# Patient Record
Sex: Female | Born: 1960
Health system: Southern US, Community
[De-identification: ages and names within clinical notes are randomized; demographics above are authoritative.]

## PROBLEM LIST (undated history)

## (undated) DIAGNOSIS — F319 Bipolar disorder, unspecified: Secondary | ICD-10-CM

## (undated) DIAGNOSIS — F419 Anxiety disorder, unspecified: Secondary | ICD-10-CM

## (undated) DIAGNOSIS — J42 Unspecified chronic bronchitis: Secondary | ICD-10-CM

## (undated) DIAGNOSIS — J449 Chronic obstructive pulmonary disease, unspecified: Secondary | ICD-10-CM

## (undated) DIAGNOSIS — F609 Personality disorder, unspecified: Secondary | ICD-10-CM

## (undated) DIAGNOSIS — F32A Depression, unspecified: Secondary | ICD-10-CM

## (undated) DIAGNOSIS — F329 Major depressive disorder, single episode, unspecified: Secondary | ICD-10-CM

## (undated) HISTORY — DX: Personality disorder, unspecified: F60.9

## (undated) HISTORY — DX: Depression, unspecified: F32.A

## (undated) HISTORY — PX: TONSILLECTOMY: SHX5217

## (undated) HISTORY — DX: Major depressive disorder, single episode, unspecified: F32.9

## (undated) HISTORY — DX: Bipolar disorder, unspecified: F31.9

## (undated) HISTORY — DX: Anxiety disorder, unspecified: F41.9

## (undated) HISTORY — DX: Chronic obstructive pulmonary disease, unspecified: J44.9

## (undated) HISTORY — PX: CARPAL TUNNEL RELEASE: SHX101

## (undated) HISTORY — DX: Unspecified chronic bronchitis: J42

---

## 2006-05-21 ENCOUNTER — Encounter: Admission: RE | Admit: 2006-05-21 | Discharge: 2006-05-21 | Payer: Self-pay | Admitting: Family Medicine

## 2006-07-09 ENCOUNTER — Ambulatory Visit (HOSPITAL_BASED_OUTPATIENT_CLINIC_OR_DEPARTMENT_OTHER): Admission: RE | Admit: 2006-07-09 | Discharge: 2006-07-09 | Payer: Self-pay | Admitting: Orthopedic Surgery

## 2007-02-28 ENCOUNTER — Ambulatory Visit (HOSPITAL_COMMUNITY): Admission: RE | Admit: 2007-02-28 | Discharge: 2007-02-28 | Payer: Self-pay | Admitting: Gastroenterology

## 2007-03-13 ENCOUNTER — Ambulatory Visit (HOSPITAL_BASED_OUTPATIENT_CLINIC_OR_DEPARTMENT_OTHER): Admission: RE | Admit: 2007-03-13 | Discharge: 2007-03-13 | Payer: Self-pay | Admitting: Orthopedic Surgery

## 2008-05-26 ENCOUNTER — Observation Stay (HOSPITAL_COMMUNITY): Admission: EM | Admit: 2008-05-26 | Discharge: 2008-05-27 | Payer: Self-pay | Admitting: Emergency Medicine

## 2009-01-10 ENCOUNTER — Encounter: Payer: Self-pay | Admitting: Internal Medicine

## 2009-01-15 ENCOUNTER — Encounter: Payer: Self-pay | Admitting: Internal Medicine

## 2009-02-08 ENCOUNTER — Encounter: Payer: Self-pay | Admitting: Internal Medicine

## 2009-02-17 ENCOUNTER — Ambulatory Visit: Payer: Self-pay | Admitting: Internal Medicine

## 2009-02-17 DIAGNOSIS — I10 Essential (primary) hypertension: Secondary | ICD-10-CM

## 2009-02-17 DIAGNOSIS — F17200 Nicotine dependence, unspecified, uncomplicated: Secondary | ICD-10-CM | POA: Insufficient documentation

## 2009-02-17 DIAGNOSIS — R05 Cough: Secondary | ICD-10-CM

## 2009-02-17 DIAGNOSIS — R059 Cough, unspecified: Secondary | ICD-10-CM | POA: Insufficient documentation

## 2009-02-17 HISTORY — DX: Essential (primary) hypertension: I10

## 2009-04-29 ENCOUNTER — Emergency Department (HOSPITAL_COMMUNITY): Admission: EM | Admit: 2009-04-29 | Discharge: 2009-04-29 | Payer: Self-pay | Admitting: Emergency Medicine

## 2009-06-28 ENCOUNTER — Encounter: Admission: RE | Admit: 2009-06-28 | Discharge: 2009-06-28 | Payer: Self-pay | Admitting: General Surgery

## 2009-07-07 ENCOUNTER — Encounter: Admission: RE | Admit: 2009-07-07 | Discharge: 2009-07-07 | Payer: Self-pay | Admitting: *Deleted

## 2009-07-13 ENCOUNTER — Encounter: Admission: RE | Admit: 2009-07-13 | Discharge: 2009-07-13 | Payer: Self-pay | Admitting: *Deleted

## 2009-08-03 ENCOUNTER — Emergency Department (HOSPITAL_COMMUNITY): Admission: EM | Admit: 2009-08-03 | Discharge: 2009-08-03 | Payer: Self-pay | Admitting: Emergency Medicine

## 2009-08-11 ENCOUNTER — Inpatient Hospital Stay (HOSPITAL_COMMUNITY): Admission: AD | Admit: 2009-08-11 | Discharge: 2009-08-16 | Payer: Self-pay | Admitting: Psychiatry

## 2009-08-11 ENCOUNTER — Ambulatory Visit: Payer: Self-pay | Admitting: Psychiatry

## 2009-12-23 ENCOUNTER — Ambulatory Visit (HOSPITAL_COMMUNITY)
Admission: RE | Admit: 2009-12-23 | Discharge: 2009-12-23 | Payer: Self-pay | Source: Home / Self Care | Attending: Obstetrics and Gynecology | Admitting: Obstetrics and Gynecology

## 2010-02-05 ENCOUNTER — Encounter: Payer: Self-pay | Admitting: Gastroenterology

## 2010-02-16 NOTE — Letter (Signed)
Summary: [facility]  [facility]   Imported By: Sherian Rein 02/22/2009 13:41:27  _____________________________________________________________________  External Attachment:    Type:   Image     Comment:   External Document

## 2010-02-16 NOTE — Letter (Signed)
Summary: [facility]  [facility]   Imported By: Sherian Rein 02/22/2009 13:38:51  _____________________________________________________________________  External Attachment:    Type:   Image     Comment:   External Document

## 2010-02-16 NOTE — Assessment & Plan Note (Signed)
Summary: Pulmonary consultation/ eval cough and sob   Visit Type:  Initial Consult Copy to:  Dr. Warrick Parisian Primary Provider/Referring Provider:  Dr. Warrick Parisian   CC:  Dyspnea.  History of Present Illness: 57 yowf smoking since the age 50 with no prevous chronic or recurrent lower resp resp  c/os  despite constant year round nasal congestion and varible severity " sinus pains"  February 17, 2009 initial pulmonary eval  cc persistent daily sob and chest burning dry cough indolent 08/2009 but  esp worse x 3 week prior to first pulmonary  ov seen twice at [facility] no better with symbicort and proaire.  Not sleeping well but not bothered by night time co's or early am exac. Also notes overt intermittent hb   Pt denies any significant sore throat, dysphagia, itching, sneezing,  nasal congestion or excess secretions,  fever, chills, sweats, unintended wt loss, pleuritic or exertional cp, hempoptysis, change in activity tolerance  orthopnea pnd or leg swelling   Current Medications (verified): 1)  Lamictal 200 Mg Tabs (Lamotrigine) .... 2 Once Daily 2)  Trazodone Hcl 300 Mg Tabs (Trazodone Hcl) .Marland Kitchen.. 1 At Bedtime 3)  Lithium Carbonate 450 Mg Cr-Tabs (Lithium Carbonate) .Marland Kitchen.. 1 Three Times A Day 4)  Dexilant 60 Mg Cpdr (Dexlansoprazole) .Marland Kitchen.. 1 Every Am 5)  Symbicort 160-4.5 Mcg/act Aero (Budesonide-Formoterol Fumarate) .Marland Kitchen.. 1 Puff Two Times A Day 6)  Clonazepam 0.5 Mg Tabs (Clonazepam) .Marland Kitchen.. 1 Three Times A Day 7)  Proair Hfa 108 (90 Base) Mcg/act Aers (Albuterol Sulfate) .Marland Kitchen.. 1-2 Puffs Every 4-6 Hours As Needed  Allergies (verified): 1)  ! * Avelox 2)  ! Pcn  Past History:  Past Medical History: BIPOLAR DISORDER UNSPECIFIED (ICD-296.80) HYPERTENSION (ICD-401.9) Sinusitis     - surgery 1988   Past Surgical History: C-Section x 3 Sinus surgery 1988  Family History: Metastatic Breast CA- Mother Negative for respiratory diseases or atopy   Social  History: Married Ship broker Current smoker since age 18.  Smokes 1 ppd.    Review of Systems       The patient complains of shortness of breath with activity, non-productive cough, chest pain, acid heartburn, loss of appetite, abdominal pain, anxiety, and depression.  The patient denies shortness of breath at rest, productive cough, coughing up blood, irregular heartbeats, indigestion, weight change, difficulty swallowing, sore throat, tooth/dental problems, headaches, nasal congestion/difficulty breathing through nose, sneezing, itching, ear ache, hand/feet swelling, joint stiffness or pain, rash, change in color of mucus, and fever.    Vital Signs:  Patient profile:   50 year old female Height:      65 inches Weight:      166 pounds BMI:     27.72 O2 Sat:      98 % on Room air Temp:     98.3 degrees F oral Pulse rate:   80 / minute BP sitting:   114 / 76  (left arm)  Vitals Entered By: Vernie Murders (February 17, 2009 2:29 PM)  O2 Flow:  Room air  Physical Exam  Additional Exam:  amb anxious slt hoarse wm nad wt 166 February 17, 2009 HEENT mild turbinate edema.  Oropharynx no thrush or excess pnd or cobblestoning.  No JVD or cervical adenopathy. Mild accessory muscle hypertrophy. Trachea midline, nl thryroid. Chest was hyperinflated by percussion with diminished breath sounds and moderate increased exp time without wheeze. Hoover sign positive at mid inspiration. Regular rate and rhythm without murmur gallop or rub or  increase P2 or edema.  Abd: no hsm, nl excursion. Ext warm without cyanosis or clubbing.     CXR  Procedure date:  02/17/2009  Findings:        Comparison: 05/26/2008.  CT 05/26/2008.   Findings: The cardiac silhouette is normal size and shape. Lungs are free of infiltrates.  No pleural effusions are seen. There is scoliosis convexity to the right. There is mild degenerative spondylosis compatible with age.   IMPRESSION: No cardiopulmonary abnormality is  seen.  There is scoliosis  Impression & Recommendations:  Problem # 1:  COUGH (ICD-786.2)  Spirometry reviewed from 02/08/09 and is not c/w sign airflow obstruction in the effort independent poart of the f/v loop so the digital readout is unreliable and should be ignored.  Believe this is Upper airway cough syndrome, so named because it's frequently impossible to sort out how much is LPR vs CR/sinusitis with freq throat clearing generating secondary extra esophageal GERD from wide swings in gastric pressure that occur with throat clearing, promoting self use of mint and menthol lozenges that reduce the lower esophageal sphincter tone and exacerbate the problem further.  These symptoms are easily confused with asthma/copd by even experienced pulmonogists because they overlap so much. These are the same pts who not infrequently have failed to tolerate ace inhibitors,  dry powder inhalers or biphosphonates or report having reflux symptoms that don't respond to standard doses of PPI   Of the three most common causes of chronic cough, only one (GERD)  can actually cause the other two (Asthma/chronic rhinitis)  and perpetuate the cylce of cough inducing airway trauma, inflammation, heightened sensitivity to reflux which is prompted by the cough itself via a cyclical mechanism.  This may partially respond to steroids and look like asthma and post nasal drainage but never erradicated completely unless the cough and the secondary reflux are eliminated, preferably both at the same time.   See instructions for specific recommendations re first treating gerd /cyclical cough maximally and f/u here if needed  Problem # 2:  SMOKER (ICD-305.1)  I took this opportunity to educate the patient regarding the consequences of smoking in airway disorders based on all the data we have from the multiple national lung health studies indicating that smoking cessation, not choice of inhalers or physicians, is the most important  aspect of care.    I explained to the pt as clearly as I could that although I would see her if she continues to smoke and promised to try to help her, I could not promise to be effective in treating her without a ful committment for her not to smoke again.   Orders: Consultation Level V [id number])  Medications Added to Medication List This Visit: 1)  Lamictal 200 Mg Tabs (Lamotrigine) .... 2 once daily 2)  Trazodone Hcl 300 Mg Tabs (Trazodone hcl) .Marland Kitchen.. 1 at bedtime 3)  Lithium Carbonate 450 Mg Cr-tabs (Lithium carbonate) .Marland Kitchen.. 1 three times a day 4)  Dexilant 60 Mg Cpdr (Dexlansoprazole) .Marland Kitchen.. 1 every am 5)  Symbicort 160-4.5 Mcg/act Aero (Budesonide-formoterol fumarate) .Marland Kitchen.. 1 puff two times a day 6)  Clonazepam 0.5 Mg Tabs (Clonazepam) .Marland Kitchen.. 1 three times a day 7)  Proair Hfa 108 (90 Base) Mcg/act Aers (Albuterol sulfate) .Marland Kitchen.. 1-2 puffs every 4-6 hours as needed 8)  Tramadol Hcl 50 Mg Tabs (Tramadol hcl) .... One  every 4-6 hours for excess cough  Other Orders: T-2 View CXR (71020TC)  Patient Instructions: 1)  Continue dexilant Take  one 30-60 min before first meal of the day and add pepcid 20mg  at bedtime whether you think you need it or not on a trial basis for one month 2)  stop symbicort and proaire 3)  use combivent 2 puffs every 4 hours as needed for cough or wheeze or short of breath 4)  Take delsym two tsp every 12 hours and add tramadol 50 mg up to every 4 hours to suppress the urge to cough. Swallowing water or using ice chips/non mint and menthol containing candies (such as lifesavers or sugarless jolly ranchers) are also effective.  5)  GERD (REFLUX)  is a common cause of respiratory symptoms. It commonly presents without heartburn and can be treated with medication, but also with lifestyle changes including avoidance of late meals, excessive alcohol, smoking cessation, and avoid fatty foods, chocolate, peppermint, colas, red wine, and acidic juices such as orange juice. NO MINT OR  MENTHOL PRODUCTS SO NO COUGH DROPS  6)  USE SUGARLESS CANDY INSTEAD (jolley ranchers)  7)  NO OIL BASED VITAMINS 8)  if not satisfied return here  9)  Work on inhaler technique:  relax and blow all the way out then take a nice smooth deep breath back in, triggering the inhaler at same time you start breathing in  Prescriptions: TRAMADOL HCL 50 MG  TABS (TRAMADOL HCL) One  every 4-6 hours for excess cough  #40 x 0   Entered and Authorized by:   Nyoka Cowden MD   Signed by:   Nyoka Cowden MD on 02/17/2009   Method used:   Electronically to        [facility]. [phone]* (retail)       [phone]       [place], [place]  [address]       Ph: [phone]       Fax: [phone]   RxID:   [id number]

## 2010-03-28 LAB — CBC
HCT: 39.7 % (ref 36.0–46.0)
Hemoglobin: 13.4 g/dL (ref 12.0–15.0)
MCHC: 33.8 g/dL (ref 30.0–36.0)
MCV: 94.1 fL (ref 78.0–100.0)

## 2010-04-01 LAB — COMPREHENSIVE METABOLIC PANEL
ALT: 42 U/L — ABNORMAL HIGH (ref 0–35)
Alkaline Phosphatase: 49 U/L (ref 39–117)
CO2: 27 mEq/L (ref 19–32)
Calcium: 9.3 mg/dL (ref 8.4–10.5)
GFR calc non Af Amer: >60 mL/min (ref >60)
Glucose, Bld: 78 mg/dL (ref 70–99)
Sodium: 139 mEq/L (ref 135–145)
Total Bilirubin: 0.5 mg/dL (ref 0.3–1.2)

## 2010-04-01 LAB — URINALYSIS, ROUTINE W REFLEX MICROSCOPIC
Hgb urine dipstick: NEGATIVE
Protein, ur: NEGATIVE mg/dL
Urobilinogen, UA: 0.2 mg/dL (ref 0.0–1.0)

## 2010-04-01 LAB — CBC
HCT: 36.5 % (ref 36.0–46.0)
Hemoglobin: 12.4 g/dL (ref 12.0–15.0)
MCH: 31 pg (ref 26.0–34.0)
MCHC: 34 g/dL (ref 30.0–36.0)
MCV: 90.8 fL (ref 78.0–100.0)
Platelets: 297 10*3/uL (ref 150–400)
RDW: 13.1 % (ref 11.5–15.5)
WBC: 12.3 10*3/uL — ABNORMAL HIGH (ref 4.0–10.5)

## 2010-04-01 LAB — POCT I-STAT, CHEM 8
HCT: 42 % (ref 36.0–46.0)
Hemoglobin: 14.3 g/dL (ref 12.0–15.0)
Sodium: 141 mEq/L (ref 135–145)
TCO2: 23 mmol/L (ref 0–100)

## 2010-04-01 LAB — URINE MICROSCOPIC-ADD ON

## 2010-04-01 LAB — RAPID URINE DRUG SCREEN, HOSP PERFORMED
Amphetamines: POSITIVE — AB
Barbiturates: NOT DETECTED
Benzodiazepines: POSITIVE — AB
Opiates: NOT DETECTED
Tetrahydrocannabinol: NOT DETECTED

## 2010-04-01 LAB — DIFFERENTIAL
Basophils Absolute: 0.1 10*3/uL (ref 0.0–0.1)
Eosinophils Absolute: 0.1 10*3/uL (ref 0.0–0.7)
Eosinophils Relative: 1 % (ref 0–5)
Neutrophils Relative %: 75 % (ref 43–77)

## 2010-04-01 LAB — URINE DRUGS OF ABUSE SCREEN W ALC, ROUTINE (REF LAB)
Amphetamine Screen, Ur: NEGATIVE
Cocaine Metabolites: NEGATIVE
Creatinine,U: 198.4 mg/dL
Ethyl Alcohol: <10 mg/dL (ref <10)
Opiate Screen, Urine: NEGATIVE

## 2010-04-01 LAB — TSH: TSH: 0.83 u[IU]/mL (ref 0.350–4.500)

## 2010-04-04 LAB — CBC
Platelets: 271 10*3/uL (ref 150–400)
WBC: 12.4 10*3/uL — ABNORMAL HIGH (ref 4.0–10.5)

## 2010-04-04 LAB — DIFFERENTIAL
Basophils Absolute: 0.1 10*3/uL (ref 0.0–0.1)
Lymphocytes Relative: 24 % (ref 12–46)
Lymphs Abs: 3 10*3/uL (ref 0.7–4.0)
Neutrophils Relative %: 67 % (ref 43–77)

## 2010-04-04 LAB — BASIC METABOLIC PANEL
BUN: 5 mg/dL — ABNORMAL LOW (ref 6–23)
Calcium: 9.4 mg/dL (ref 8.4–10.5)
Creatinine, Ser: 0.64 mg/dL (ref 0.4–1.2)
GFR calc non Af Amer: >60 mL/min (ref >60)

## 2010-04-04 LAB — URINALYSIS, ROUTINE W REFLEX MICROSCOPIC
Glucose, UA: NEGATIVE mg/dL
Nitrite: POSITIVE — AB
Protein, ur: NEGATIVE mg/dL
pH: 7 (ref 5.0–8.0)

## 2010-04-04 LAB — URINE MICROSCOPIC-ADD ON

## 2010-04-16 ENCOUNTER — Other Ambulatory Visit: Payer: Self-pay | Admitting: Internal Medicine

## 2010-04-25 LAB — COMPREHENSIVE METABOLIC PANEL
Alkaline Phosphatase: 54 U/L (ref 39–117)
BUN: 7 mg/dL (ref 6–23)
Glucose, Bld: 93 mg/dL (ref 70–99)
Potassium: 3.8 mEq/L (ref 3.5–5.1)
Total Protein: 6.2 g/dL (ref 6.0–8.3)

## 2010-04-25 LAB — POCT CARDIAC MARKERS
CKMB, poc: <1 ng/mL — ABNORMAL LOW (ref 1.0–8.0)
Myoglobin, poc: 43.8 ng/mL (ref 12–200)

## 2010-04-25 LAB — MAGNESIUM: Magnesium: 2.1 mg/dL (ref 1.5–2.5)

## 2010-04-25 LAB — LIPID PANEL
Cholesterol: 223 mg/dL — ABNORMAL HIGH (ref 0–200)
LDL Cholesterol: 149 mg/dL — ABNORMAL HIGH (ref 0–99)

## 2010-04-25 LAB — DIFFERENTIAL
Basophils Absolute: 0.1 10*3/uL (ref 0.0–0.1)
Basophils Relative: 1 % (ref 0–1)
Monocytes Relative: 7 % (ref 3–12)
Neutro Abs: 6.2 10*3/uL (ref 1.7–7.7)
Neutrophils Relative %: 64 % (ref 43–77)

## 2010-04-25 LAB — PHOSPHORUS: Phosphorus: 3.7 mg/dL (ref 2.3–4.6)

## 2010-04-25 LAB — URINALYSIS, ROUTINE W REFLEX MICROSCOPIC
Glucose, UA: NEGATIVE mg/dL
Hgb urine dipstick: NEGATIVE
Ketones, ur: NEGATIVE mg/dL
Protein, ur: NEGATIVE mg/dL

## 2010-04-25 LAB — CBC
HCT: 34 % — ABNORMAL LOW (ref 36.0–46.0)
HCT: 38.5 % (ref 36.0–46.0)
Hemoglobin: 11.9 g/dL — ABNORMAL LOW (ref 12.0–15.0)
Hemoglobin: 13.3 g/dL (ref 12.0–15.0)
MCHC: 34.5 g/dL (ref 30.0–36.0)
MCV: 95.9 fL (ref 78.0–100.0)
RBC: 3.54 MIL/uL — ABNORMAL LOW (ref 3.87–5.11)
RDW: 13 % (ref 11.5–15.5)
WBC: 11.6 10*3/uL — ABNORMAL HIGH (ref 4.0–10.5)

## 2010-04-25 LAB — CK TOTAL AND CKMB (NOT AT ARMC): Total CK: 65 U/L (ref 7–177)

## 2010-04-25 LAB — PREGNANCY, URINE: Preg Test, Ur: NEGATIVE

## 2010-05-30 NOTE — Op Note (Signed)
[name], [name]               ACCOUNT NO.:  [id number]   MEDICAL RECORD NO.:  [id number]          PATIENT TYPE:  AMB   LOCATION:  DSC                          FACILITY:  [facility]   PHYSICIAN:  Katy Fitch. Sypher, M.D. DATE OF BIRTH:  October 13, 1960   DATE OF PROCEDURE:  07/09/2006  DATE OF DISCHARGE:                               OPERATIVE REPORT   PREOPERATIVE DIAGNOSES:  1. Chronic right lateral elbow extensor tendinopathy with magnetic      resonance imaging proven disruption of extensor carpi radialis      brevis origin.  2. Chronic right carpal tunnel syndrome.   POSTOPERATIVE DIAGNOSES:  1. Chronic right lateral elbow extensor tendinopathy with magnetic      resonance imaging proven disruption of extensor carpi radialis      brevis origin.  2. Chronic right carpal tunnel syndrome.   OPERATION:  1. Reconstruction of right common extensor origin with repair of the      extensor carpi radialis longus and brevis to a decorticated and      drilled right lateral epicondyle with McLaughlin through bone      suture technique.  2. Right carpal tunnel release through separate palmar incision.   OPERATIONS:  Danielle Cox, M.D.   ASSISTANT:  Danielle Maduro Dasnoit PA-C.   ANESTHESIA:  General by LMA.   SUPERVISING ANESTHESIOLOGIST:  Burna Forts, M.D.   INDICATIONS:  Danielle Cox is a 50 year old employee of the [employer].   She was referred for evaluation and management of chronic right lateral  elbow pain and chronic right hand numbness.   Preoperative evaluation including an MRI of the elbow confirmed chronic  extensor origin tendinopathy with edema in the lateral ulnar collateral  ligament origin.  Danielle Cox had failed more than 18 months of  nonoperative management.  Her carpal tunnel syndrome was evaluated with  preoperative electrodiagnostic studies which confirmed significant  median neuropathy.   After informed consent, she is brought to the  operating room at this  time.   PROCEDURE:  Danielle Cox is brought to the operating room and placed in  supine position on the operating table.   Following the induction of general anesthesia by LMA technique, the  right arm was prepped with Betadine soap solution and sterilely draped.   Due to a penicillin allergy, 1 gram of vancomycin was administered  slowly over 90 minutes as an IV prophylactic antibiotic.   The right arm was prepped with Betadine soap solution and sterilely  draped.  A pneumatic tourniquet was applied to proximal right brachium.   Following exsanguination of the right arm with an Esmarch bandage, the  arterial tourniquet was inflated to 250 mmHg.  The procedure commenced  with a short incision in the line of the ring finger in the palm.  Subcutaneous tissues were carefully divided from the palmar fascia.  This was split longitudinally to reveal the common sensory branch of the  median nerve.  These were followed back to the transverse carpal  ligament which was gently isolated at the median nerve.  A [place]field 4  Engineer, structural  was used to gently separate the median nerve from the  transverse carpal ligament.  The ligament was then released  subcutaneously extending into the distal forearm.  This widely opened  the carpal canal.  No mass or other predicaments were noted.  Bleeding  points along the margin of the loose ligament were electrocauterized  with bipolar current followed by repair of the skin with intradermal 3-0  Prolene suture.   Attention was then directed to the right lateral elbow.  An incision was  fashioned along the border between the anconeus and extensor carpi  radialis longus.  Subcutaneous tissues were carefully divided  identifying the epicondyle and epicondylar ridge.  The common extensor  origin including the extensor carpi radialis longus and brevis was  elevated directly off of the epicondyle.  A large cavity was noted at  the brevis  origin deep to the longus.  There was calcification of the  tendon.  The entire tendon was elevated to the level of the radial  capitellar articulation.  Necrotic tendon was removed with a fine  rongeur followed by multiply drilling the lateral condyle.   The common origin was then repaired with two mattress sutures in manner  of McLaughlin with a medial suture row and an over-the-top row to inset  the tendon to the decorticated and drilled epicondyle.  An anatomic  repair was achieved placing the elbow in full extension and full wrist  extension at the time of suture knotting.   The fascia was then repaired with a running suture of 4-0 Vicryl  repairing the periosteum of the humerus to the forearm fascia.  This  reestablished a proper contour of the epicondyle.   The wound was then repaired with subdermal sutures of 4-0 Vicryl and  intradermal 3-0 Prolene.   A compressive dressing was applied to the hand and wrist with a volar  plaster splint maintaining the wrist in 15 degrees of dorsiflexion.  The  elbow was protected with a Tegaderm and sterile gauze dressing and Ace  wrap.  There were no apparent complications.   For aftercare, Danielle Cox was provided a prescription for Dilaudid 2 mg  1-2 tablets p.o. q.4-6h. pain 30 tablets without refill and Motrin 600  mg 1 p.o. q.6h. p.r.n. pain.   We will provide no further prophylactic antibiotics other than the  perioperative vancomycin.   There were no apparent complications.      Katy Fitch Sypher, M.D.  Electronically Signed     RVS/MEDQ  D:  07/09/2006  T:  07/09/2006  Job:  [id number]   cc:   Katy Fitch. Sypher, M.D.

## 2010-05-30 NOTE — H&P (Signed)
Danielle Cox, Danielle Cox               ACCOUNT NO.:  [id number]   MEDICAL RECORD NO.:  [id number]          PATIENT TYPE:  OBV   LOCATION:  1828                         FACILITY:  [facility]   PHYSICIAN:  Lonia Blood, M.D.DATE OF BIRTH:  1960-08-09   DATE OF ADMISSION:  05/26/2008  DATE OF DISCHARGE:                              HISTORY & PHYSICAL   PRIMARY CARE PHYSICIAN:  Production assistant, radio.   CHIEF COMPLAINT:  Chest pressure.   PRESENT ILLNESS:  Ms. Danielle Cox is a 50 year old female smoker with  unknown lipid status who is not diabetic and does not suffer with  hypertension, who presents to the hospital with complaints of chest  pressure.  Though her history is somewhat rambling, she does state that  she has had substernal chest type pressure sensations for months now.  Of significance, it appears to be occurring at a greater frequency over  the last 2 weeks.  Today, while at work, the patient had an episode  described as severe pressure-like sensation directly into the sternum.  This was associated with diaphoresis and significant shortness of  breath.  The patient states that her previous episodes appear to be  worse with physical exertion.  Previously they got better with rest.  This episode occurred while she was somewhat exerting herself, but would  not improve when she rested.  She describes progressive weakness and  generalized dizziness with these episodes over the last few weeks as  well.  Today, she is particularly upset because she was told by  someone that the Avelox that she recently took should not be mixed with  her Geodon and could have led to all of her chest symptoms.   The patient denies fevers, chills, nausea or vomiting.  In the emergency  room she has received nitroglycerin and aspirin.  Her symptoms persist,  though they are minimal at the present time.  She has had an EKG which  was entirely normal during her pain.  The QTC on the EKG is 487  with a  QT of 366.  The patient denies fevers or chills.  There has been some  nausea but no vomiting.  There has been no hematemesis, hematochezia or  melena.  There has been no abdominal pain.   REVIEW OF SYSTEMS:  The patient was recently treated with Avelox for a  sinus infection.  This was prescribed on May 3.  Otherwise,  comprehensive review of systems is unremarkable.  The patient states  that her bipolar disorder is reasonably well-controlled.  She follows  regularly with a psychiatrist for this.   PAST MEDICAL HISTORY:  1. Tobacco abuse, one pack per day plus, since the teen years.  2. Bipolar disorder:  Under care of psychiatrist.   OUTPATIENT MEDICATIONS:  1. Effexor 300 mg daily.  2. Geodon 40 mg daily times 2 months now.  3. Lamictal 400 mg daily.  4. Trazodone 150 mg q.h.s.   ALLERGIES:  PENICILLIN.   FAMILY HISTORY:  The patient's mother died with multiple cancers and a  CVA, per the patient's report.  The patient's father died  of  complications from alcoholism.   SOCIAL HISTORY:  The patient lives in [place].  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  She does smoke.  She denies significant alcohol intake.  She denies illicit drug use.   DATA REVIEW:  CBC is unremarkable.  CMET is unremarkable.  Urinalysis is  unrevealing.  Urine pregnancy test is negative.  Point-of-care markers  are negative x1.  Chest x-ray reveals a left upper lobe nodular density,  with recommendation for a CAT scan of the chest.   CAT scan of the chest was accomplished, with no evidence of pulmonary  embolism, small pericardial effusion and few pulmonary scars; but no  evidence of nodule.  A 12-lead EKG with normal sinus rhythm at 86 beats  per minute, with no acute ST or T-wave changes; with a QTC of 487.   PHYSICAL EXAMINATION:  Temperature 98.4, blood pressure 116/70, heart  rate 70, respiratory rate 16, O2 saturations 96% on room air.  GENERAL:   Well-developed, well-nourished female in no acute respiratory  distress.  HEENT: Normocephalic, atraumatic.  Pupils equal, round and react to  light and accommodation.  Extraocular muscles intact.  Oropharynx clear.  NECK:  There is no JVD, no lymphadenopathy and no thyromegaly.  LUNGS:  Clear to auscultation bilaterally without wheezes or rhonchi.  CARDIOVASCULAR:  Regular rate and rhythm without murmur, gallop or rub.  Normal S1 and S2.  ABDOMEN:  Overweight, soft bowel sounds present.  No organomegaly.  No  rebound or ascites.  EXTREMITIES:  No significant cyanosis, clubbing; edema bilateral lower  extremities.  NEUROLOGIC:  Nonfocal neurologic exam.   IMPRESSION AND PLAN:  1. Chest pressure:  The patient's description of chest pressure is in      fact consistent with angina.  Previously it was worse with exertion      and improved with rest.  Now it simply is persistent.  The patient,      however, has had a completely normal EKG during her pain.      Additionally, the patient has minimal risk factors, to include      tobacco abuse and possibly hyperlipidemia.  Given concern that this      represents accelerating angina, I will place the patient on full      anticoagulation using Lovenox.  We will continue aspirin.  Beta      blocker will be empirically administered.  The patient will be      placed on nitroglycerin.  We will cycle cardiac enzymes and serial      EKGs.  If they remain entirely negative over a  24-hour period, the patient would likely benefit from an outpatient  stress test.  If the cardiac enzymes change, however, she clearly will  need further inpatient evaluation.  1. Tobacco abuse:  I have counseled the patient as to the multiple      deleterious effects of ongoing tobacco abuse.  I have advised her      to discontinue tobacco abuse.  We will provide the patient with a      nicotine patch and request a formal tobacco cessation consultation.  2. Concomitant  use of Avelox and Geodon  Indeed, the concomittant use      of Avelox and Geodon is contraindicated, due to a significant risk      for prolonged QTC and resultant developing cardiac arrhythmias.  At      the present time, however,  the patient's QTC appears to be only      borderline prolonged.  There is no evidence of significant      arrhythmia.  I have advised the patient that she should avoid      Avelox further and in the future should she continue using Geodon,      but have suggested that it is not likely that her current symptoms      are related.  Nonetheless, I have informed her that it is possible      and that we will need to monitor her on telemetry      during her hospital stay.  3. Bipolar disorder:  At the present time the patient's bipolar      appears to be well-controlled.  We will continue her outpatient      medication regimen.      Lonia Blood, M.D.  Electronically Signed     JTM/MEDQ  D:  05/26/2008  T:  05/26/2008  Job:  [id number]   cc:   [facility]

## 2010-05-30 NOTE — Op Note (Signed)
NAMEKASHMIR, LEEDY               ACCOUNT NO.:  [id number]   MEDICAL RECORD NO.:  [id number]          PATIENT TYPE:  AMB   LOCATION:  DSC                          FACILITY:  [facility]   PHYSICIAN:  Loreta Ave, M.D. DATE OF BIRTH:  14-May-1960   DATE OF PROCEDURE:  03/13/2007  DATE OF DISCHARGE:                               OPERATIVE REPORT   PREOPERATIVE DIAGNOSIS:  Nonunited stress fracture, proximal shaft,  fifth metatarsal, right foot.   POSTOPERATIVE DIAGNOSIS:  Nonunited stress fracture, proximal shaft,  fifth metatarsal, right foot.   PROCEDURE:  Open reduction internal fixation utilizing cannulated 4.5 mm  x 48 mm lag screw and multiple drilling across the site of nonunited  stress fracture.   SURGEON:  Loreta Ave, M.D.   ASSISTANT:  Zonia Kief, PA   ANESTHESIA:  General.   BLOOD LOSS:  Minimal.   TOURNIQUET TIME:  35 minutes.   SPECIMENS:  None.   CULTURES:  None.   COMPLICATIONS:  None.   DRESSING:  Soft compressive with splint.   PROCEDURE:  The patient brought to the operating room, placed on  operating table in supine position.  After adequate anesthesia had been  obtained, tourniquet applied to the upper aspect of the right leg.  Prepped and draped in the usual sterile fashion..  Fluoroscopic guidance  used throughout.  Longitudinal incision at the base of the fifth  metatarsal.  A guidewire was then passed across the fracture site with  multiple drilling to stimulate vascularity and healing.  The guidewire  was then passed distally into the shaft confirming good position.  Measured for 48-mm screw which gave excellent purchase distally.  Pre  drilled and then fixed with the 4.5-mm x 48-mm lag screw which was  countersunk down into the head of the metatarsal.  Excellent alignment,  capturing and fixation confirmed  visually and fluoroscopically.  Wound irrigated and closed with nylon.  Sterile compressive dressing applied.  Splint applied.   Tourniquet  inflated and removed.  Anesthesia reversed.  Brought to recovery room.  Tolerated surgery well.  No complications.      Loreta Ave, M.D.  Electronically Signed     DFM/MEDQ  D:  03/13/2007  T:  03/14/2007  Job:  [id number]

## 2010-05-30 NOTE — Discharge Summary (Signed)
NAMEDOMIQUE, REARDON               ACCOUNT NO.:  [id number]   MEDICAL RECORD NO.:  [id number]          PATIENT TYPE:  OBV   LOCATION:  3735                         FACILITY:  [facility]   PHYSICIAN:  Michelene Gardener, MD    DATE OF BIRTH:  02-Dec-1960   DATE OF ADMISSION:  05/26/2008  DATE OF DISCHARGE:  05/27/2008                               DISCHARGE SUMMARY   DISCHARGE DIAGNOSES:  1. Non cardiac chest pain most likely secondary to stress.  2. Bipolar disorder.  3. Tobacco abuse.   DISCHARGE MEDICATIONS:  1. Effexor 300 mg p.o. once daily.  2. Geodon 40 mg once a day.  3. Lamictal 400 mg p.o. once a day.  4. Trazodone 150 mg at bedtime.   CONSULTATIONS:  None.   PROCEDURES:  None.   DIAGNOSTIC STUDIES:  1. Chest x-ray on May 26, 2008, showed left upper lobe nodular density      that needed a CT evaluation.  2. CT angio of the chest on May 26, 2008, showed no evidence of PE.      It showed small pericardial effusion and few pulmonary scars with      possible minimal atelectasis posteriorly in the right lower lobe.   Followup with primary doctor at [facility] within a  week for evaluation of echocardiogram versus stress test.   COURSE OF HOSPITALIZATION:  This is a 50 year old female who presented  to the hospital on May 26, 2008, complaining of chest pressure.  This  patient has been having this chest pain for around 2 weeks where she was  evaluated by her primary physician.  She was thought to have minor  bronchitis and she was prescribed antibiotics.  The patient continued to  have pain on and off that has been very atypical, came in to the  hospital where she was admitted for observation at telemetry.  Her tele  monitor showed no evidence of arrhythmia.  Her EKG showed minor  elevation in her QT, most likely related to Avelox.  She does not have  any evidence of ischemia.  She had 3 sets of troponin and cardiac  enzymes were done and they came to be  negative.  At the time of  discharge, the patient is totally asymptomatic, does not have any chest  pain, does not have any shortness of breath.  I discussed the options  with her of whether staying in the hospital and get Cardiology  evaluation for possible stress test or to follow with her primary doctor  for possible stress test as an outpatient, the patient chose to go home.  Since she has been asymptomatic with negative workup, I found that it is  reasonable.  There is no change in her medications and I told her to  stop the Avelox because it might be contributing to prolonged QT.  Otherwise, other medical conditions  remained stable at this point.  The patient will be discharged to home  today and will follow with her primary doctor within a week.   Total assessment time is 40 minutes.  Michelene Gardener, MD  Electronically Signed     NAE/MEDQ  D:  05/27/2008  T:  05/27/2008  Job:  [phone]

## 2010-06-06 ENCOUNTER — Other Ambulatory Visit: Payer: Self-pay | Admitting: Obstetrics and Gynecology

## 2010-06-06 DIAGNOSIS — Z1231 Encounter for screening mammogram for malignant neoplasm of breast: Secondary | ICD-10-CM

## 2010-07-10 ENCOUNTER — Ambulatory Visit
Admission: RE | Admit: 2010-07-10 | Discharge: 2010-07-10 | Disposition: A | Discharge status: Home / Self Care | Payer: BC Managed Care – PPO | Source: Ambulatory Visit | Attending: Obstetrics and Gynecology | Admitting: Obstetrics and Gynecology

## 2010-07-10 DIAGNOSIS — Z1231 Encounter for screening mammogram for malignant neoplasm of breast: Secondary | ICD-10-CM

## 2010-11-01 LAB — POCT HEMOGLOBIN-HEMACUE
Hemoglobin: 15.9 — ABNORMAL HIGH
Operator id: 123881

## 2011-11-21 ENCOUNTER — Other Ambulatory Visit: Payer: Self-pay | Admitting: Obstetrics and Gynecology

## 2011-11-21 DIAGNOSIS — Z1231 Encounter for screening mammogram for malignant neoplasm of breast: Secondary | ICD-10-CM

## 2012-01-01 ENCOUNTER — Ambulatory Visit: Payer: BC Managed Care – PPO

## 2012-01-28 ENCOUNTER — Ambulatory Visit
Admission: RE | Admit: 2012-01-28 | Discharge: 2012-01-28 | Disposition: A | Discharge status: Home / Self Care | Payer: BC Managed Care – PPO | Source: Ambulatory Visit | Attending: Obstetrics and Gynecology | Admitting: Obstetrics and Gynecology

## 2012-01-28 DIAGNOSIS — Z1231 Encounter for screening mammogram for malignant neoplasm of breast: Secondary | ICD-10-CM

## 2012-10-27 ENCOUNTER — Other Ambulatory Visit (HOSPITAL_COMMUNITY): Payer: Medicare Other | Attending: Psychiatry

## 2012-10-27 ENCOUNTER — Encounter (HOSPITAL_COMMUNITY): Payer: Self-pay

## 2012-10-27 DIAGNOSIS — F3162 Bipolar disorder, current episode mixed, moderate: Secondary | ICD-10-CM

## 2012-10-27 DIAGNOSIS — F411 Generalized anxiety disorder: Secondary | ICD-10-CM | POA: Insufficient documentation
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
## 2012-10-28 ENCOUNTER — Other Ambulatory Visit (HOSPITAL_COMMUNITY): Payer: Medicare Other

## 2012-10-29 ENCOUNTER — Other Ambulatory Visit (HOSPITAL_COMMUNITY): Payer: Medicare Other | Admitting: Psychiatry

## 2012-10-29 ENCOUNTER — Encounter (HOSPITAL_COMMUNITY): Payer: Self-pay

## 2012-10-29 DIAGNOSIS — F319 Bipolar disorder, unspecified: Secondary | ICD-10-CM

## 2012-10-29 MED ORDER — MIRTAZAPINE 15 MG PO TABS: 7.5000 mg | ORAL_TABLET | Freq: Every day | ORAL | Status: DC

## 2012-10-29 NOTE — Progress Notes (Unsigned)
Psychiatric Assessment Adult  Patient Identification:  Danielle Cox Date of Evaluation:  10/29/2012 Chief Complaint: Depression and anxiety History of Chief Complaint:  No chief complaint on file.   HPI Danielle Cox is a 52 year old married, unemployed, white female who is referred from [facility] where she was admitted for suicidal thoughts with a plan to overdose. She reports that she has been having a increased anxiety and depression. She was diagnosed in 1988 as bipolar, and currently sees Vear Clock, nurse practitioner for medication management, and Kem Parkinson for psychotherapy. She denies any current suicidal thoughts, and denies ever experiencing any homicidal thoughts or auditory or visual hallucinations.  She reports that she has not had a manic episode for 2 years, but endorses symptoms of mania of hyperactivity and periods of decreased need for sleep as well as increased mood and energy. She endorses periods of depression that last for months where she feels sad, hopeless, worthless, has low energy, increased sleep and decreased appetite, a desire to isolate in bed all day, and anhedonia. She reports that she has lost 20 pounds over the past few months. She also endorses symptoms of anxiety including excessive worry, panic attacks monthly, and uncomfortableness in social situations. She denies any symptoms of OCD or PTSD.   Review of Systems  Constitutional: Negative.   HENT: Negative.   Eyes: Negative.   Respiratory: Negative.   Cardiovascular: Negative.   Gastrointestinal: Negative.   Endocrine: Negative.   Genitourinary: Negative.   Musculoskeletal: Negative.   Skin: Negative.   Allergic/Immunologic: Negative.   Neurological: Negative.   Hematological: Negative.   Psychiatric/Behavioral: Positive for sleep disturbance and dysphoric mood. The patient is nervous/anxious.    Physical Exam  Constitutional: She is oriented to person, place, and time. She  appears well-developed and well-nourished.  HENT:  Head: Normocephalic and atraumatic.  Eyes: Pupils are equal, round, and reactive to light.  Neck: Normal range of motion.  Musculoskeletal: Normal range of motion.  Neurological: She is alert and oriented to person, place, and time.    Depressive Symptoms: depressed mood, anhedonia, hypersomnia, feelings of worthlessness/guilt, hopelessness, anxiety, panic attacks, decreased appetite,  (Hypo) Manic Symptoms:   Elevated Mood:  Yes Irritable Mood:  No Grandiosity:  No Distractibility:  No Labiality of Mood:  Yes Delusions:  No Hallucinations:  No Impulsivity:  No Sexually Inappropriate Behavior:  No Financial Extravagance:  No Flight of Ideas:  No  Anxiety Symptoms: Excessive Worry:  Yes Panic Symptoms:  Yes Agoraphobia:  No Obsessive Compulsive: No  Symptoms: None, Specific Phobias:  No Social Anxiety:  Yes  Psychotic Symptoms:  Hallucinations: No None Delusions:  No Paranoia:  No   Ideas of Reference:  No  PTSD Symptoms: Ever had a traumatic exposure:  Yes Had a traumatic exposure in the last month:  No Re-experiencing: No None Hypervigilance:  No Hyperarousal: No None Avoidance: No None  Traumatic Brain Injury: No   Past Psychiatric History: Diagnosis: Diagnosed bipolar in 1988   Hospitalizations: [facility] October 2014   Outpatient Care: Vear Clock, nurse practitioner; Dr. Kem Parkinson, psychologist   Substance Abuse Care: Denies   Self-Mutilation: Denies   Suicidal Attempts: Denies   Violent Behaviors: Denies    Past Medical History:   Past Medical History  Diagnosis Date  . Anxiety   . Depression   . Bipolar disorder   . Bronchitis, chronic    History of Loss of Consciousness:  No Seizure History:  No Cardiac History:  No Allergies:   Allergies  Allergen Reactions  . Moxifloxacin     REACTION: tackycardia  . Penicillins     REACTION: rash   Current Medications:   Current Outpatient Prescriptions  Medication Sig Dispense Refill  . budesonide-formoterol (SYMBICORT) 80-4.5 MCG/ACT inhaler Inhale 2 puffs into the lungs 2 (two) times daily.      . clonazePAM (KLONOPIN) 1 MG tablet Take 1 mg by mouth at bedtime as needed for anxiety.      Marland Kitchen lurasidone (LATUDA) 80 MG TABS tablet Take by mouth daily with breakfast.      . mirtazapine (REMERON) 15 MG tablet Take 0.5-1 tablets (7.5-15 mg total) by mouth at bedtime.  30 tablet  0  . traZODone (DESYREL) 150 MG tablet Take 150 mg by mouth at bedtime. Take three tabs at hs       No current facility-administered medications for this visit.    Previous Psychotropic Medications:  Medication Dose   Klonopin     Latuda    Trazodone                 Substance Abuse History in the last 12 months: Patient denies any history of substance abuse.  Social History: Danielle Cox was born and grew up in [place], [place]. She has 2 brothers and one sister. She reports that her childhood was "poor," and explains that she needs both financially and emotionally. She completed the 10th grade, then achieved her GED. She has been married twice. The first time at age 74, and that ended after 1 year. She is currently married to her second husband of 27 years. She has 2 sons and one daughter. She is currently unemployed and on disability for 2 years. She lives with her husband, her daughter, and one of her daughter's friends. She denies any legal difficulties. She reports that she is spiritual but not religious. Her hobbies include reading and cross stitch. She reports that she has no social support network.   Family History:   Family History  Problem Relation Age of Onset  . Depression Mother   . Alcohol abuse Mother   . Depression Father   . Alcohol abuse Father   . Alcohol abuse Sister   . Depression Sister   . Alcohol abuse Brother   . Depression Brother     Mental Status Examination/Evaluation: Objective:  Appearance: Casual   Eye Contact::  Good  Speech:  Clear and Coherent  Volume:  Normal  Mood:  Depressed and anxious   Affect:  Congruent  Thought Process:  Linear  Orientation:  Full (Time, Place, and Person)  Thought Content:  WDL  Suicidal Thoughts:  No  Homicidal Thoughts:  No  Judgement:  Fair  Insight:  Lacking  Psychomotor Activity:  Normal  Akathisia:  No  Handed:    AIMS (if indicated):    Assets:  Communication Skills Desire for Improvement Vocational/Educational    Laboratory/X-Ray Psychological Evaluation(s)        Assessment:    AXIS I Generalized Anxiety Disorder and Mood Disorder NOS  AXIS II Deferred  AXIS III Past Medical History  Diagnosis Date  . Anxiety   . Depression   . Bipolar disorder   . Bronchitis, chronic      AXIS IV problems related to social environment and problems with primary support group  AXIS V 41-50 serious symptoms   Treatment Plan/Recommendations:  Plan of Care: Admit to intensive outpatient program where she will attend group therapy sessions 5 days  weekly for 3 hours each session. Initiate Remeron 7.5-15 mg at bedtime or sleep. Continue medications per discharge from [facility]   Laboratory:    Psychotherapy: Attend groups   Medications: Latuda 80 mg with breakfast, Klonopin 1 mg at bedtime as needed, Remeron 7.5-15 mg at bedtime.   Routine PRN Medications:  Yes  Consultations:   Safety Concerns:  History of suicidal ideation   Other:     Yolande Jolly, MHS, PA-C  [facility] 10/15/201411:48 AM

## 2012-10-30 ENCOUNTER — Other Ambulatory Visit (HOSPITAL_COMMUNITY): Payer: Medicare Other | Admitting: Psychiatry

## 2012-10-30 ENCOUNTER — Telehealth (HOSPITAL_COMMUNITY): Payer: Self-pay | Admitting: Psychiatry

## 2012-10-30 DIAGNOSIS — F319 Bipolar disorder, unspecified: Secondary | ICD-10-CM

## 2012-10-30 NOTE — Progress Notes (Unsigned)
    Daily Group Progress Note  Program: IOP  Group Time: 9:00-10:30 am   Participation Level: Active  Behavioral Response: Appropriate  Type of Therapy:  Process Group  Summary of Progress: Pt presented with depressed and tearful mood and affect. Pt struggled to control her crying, but sat in the room the entire time. Pt did not talk.      Group Time: 10:30 am - 12:00 pm   Participation Level:  Active  Behavioral Response: Appropriate  Type of Therapy: Psycho-education Group  Summary of Progress: Pt learned the stress management skill of heartmath to reduce feelings of depression and anxiety.  Carman Ching, LCSW

## 2012-10-30 NOTE — Progress Notes (Signed)
    Daily Group Progress Note  Program: IOP  Group Time: 9:00-10:30 am   Participation Level: Minimal  Behavioral Response: Overly depressed  Type of Therapy:  Process Group  Summary of Progress: Pt was crying and struggled to contain her emotions in the group. Pt could not think clearly or make decisions and was distracting to the group process. Every single group member expressed concern for Pt acute level of depression and agreed Pt should be hospitalized. Pt denied active thoughts of suicide, but her depression is too acute for this level of care for Pt to participate and she is taking away from the milieu. This recommendation was given to teh case manager and doctor for a decision regarding continued treatment.      Group Time: 10:30 am - 12:00 pm   Participation Level:  Active  Behavioral Response: Appropriate  Type of Therapy: Psycho-education Group  Summary of Progress: Pt participated in an education segment on learning about the support groups offered through the [facility] [place].  Carman Ching, LCSW

## 2012-10-31 ENCOUNTER — Other Ambulatory Visit (HOSPITAL_COMMUNITY): Payer: Medicare Other | Admitting: Psychiatry

## 2012-10-31 DIAGNOSIS — F319 Bipolar disorder, unspecified: Secondary | ICD-10-CM

## 2012-10-31 MED ORDER — TRAZODONE HCL 50 MG PO TABS: ORAL_TABLET | ORAL | Status: DC

## 2012-10-31 NOTE — Progress Notes (Signed)
    Daily Group Progress Note  Program: IOP  Group Time: 9:00-10:30 am   Participation Level: Active  Behavioral Response: Appropriate  Type of Therapy:  Process Group  Summary of Progress: Pt continues to be very emotional and tearful during group. She struggled to be attentive and said she was in her own thoughts and worries and not able to really be present with the group members. Pt had sucidal thoughts last night but denies a current plan.      Group Time: 10:30 am - 12:00 pm   Participation Level:  Active  Behavioral Response: Appropriate  Type of Therapy: Psycho-education Group  Summary of Progress: Pt learned about the symptoms of depression and how to recognize to intervene with reducing depression symptoms.   Carman Ching, LCSW

## 2012-10-31 NOTE — Progress Notes (Signed)
Patient ID: Danielle Cox, female   DOB: September 03, 1960, 52 y.o.   MRN: [id number] Patient reviewed and interviewed today, states she feels very tired was started on Remeron by PA, [employer]. Patient is currently on trazodone 450 mg at bedtime, Remeron 15 mg at bedtime and Latuda 80 mg q hs mg at bedtime, Klonopin 1 mg at bedtime . Patient feels very tired and looks sedated is complaining of dry mouth. Patient has multiple stressors which include her 40 year old daughter, and her girlfriend and the dog moved in with the patient. Patient is very scared off the door, which is a [place]. Discussed ways to cope with her anxiety patient denies suicidal or homicidal ideation does feel anxious and at times hopeless and helpless but is able to contract for safety.  Discussed decreasing the trazodone from 450 mg to 375 mg at bedtime and continuing the other medications. Also discussed talking to her doctor in moving the dogs outside so that she feels comfortable than she is at home patient stated understanding. Patient denies hallucinations or delusions.

## 2012-11-03 ENCOUNTER — Other Ambulatory Visit (HOSPITAL_COMMUNITY): Payer: Medicare Other | Admitting: Psychiatry

## 2012-11-03 DIAGNOSIS — F319 Bipolar disorder, unspecified: Secondary | ICD-10-CM

## 2012-11-03 NOTE — Progress Notes (Signed)
Patient ID: Danielle Cox, female   DOB: 05-20-1960, 52 y.o.   MRN: [id number] Patient reviewed and interviewed today states she continues to feel depressed although the IOP staff report that her anxiety has improved significantly. The Remeron helps her sleep and she is able to get a good night's rest. Patient is tearful and worries about getting better patient was given extensive reassurance was she was able to except. Denies suicidal or homicidal ideation and has no hallucinations or delusions. She is tolerating her medications well.

## 2012-11-04 ENCOUNTER — Other Ambulatory Visit (HOSPITAL_COMMUNITY): Payer: Medicare Other | Admitting: Psychiatry

## 2012-11-04 DIAGNOSIS — F319 Bipolar disorder, unspecified: Secondary | ICD-10-CM

## 2012-11-04 NOTE — Progress Notes (Signed)
    Daily Group Progress Note  Program: IOP  Group Time: 9:00-10:30 am   Participation Level: Active  Behavioral Response: Appropriate  Type of Therapy:  Process Group  Summary of Progress: Pt continues to be very tearful every time she talks and struggles to contain her emotions. Pt presents with hopeless thoughts and affect. Pt said she does not feel she will ever not be depressed and said her motivation to work on her depression is slim. Pt said she lost her purpose when her children left home.      Group Time: 10:30 am - 12:00 pm   Participation Level:  Active  Behavioral Response: Appropriate  Type of Therapy: Psycho-education Group  Summary of Progress: Pt participated in a group on grief and loss and identified current losses impacting wellness and appropriate grieving strategies.   Carman Ching, LCSW

## 2012-11-05 ENCOUNTER — Other Ambulatory Visit (HOSPITAL_COMMUNITY): Payer: Medicare Other | Admitting: Psychiatry

## 2012-11-05 DIAGNOSIS — F319 Bipolar disorder, unspecified: Secondary | ICD-10-CM

## 2012-11-06 ENCOUNTER — Other Ambulatory Visit (HOSPITAL_COMMUNITY): Payer: Medicare Other | Admitting: Psychiatry

## 2012-11-06 NOTE — Progress Notes (Signed)
    Daily Group Progress Note  Program: IOP  Group Time: 9:00-10:30 am   Participation Level: Active  Behavioral Response: Appropriate  Type of Therapy:  Process Group  Summary of Progress: Pt continues to remain emotionally out of control with crying and appears to be making little to no progress. Pt is solidified in her depression and feelings of hopelessness.      Group Time: 10:30 am - 12:00 pm   Participation Level:  Active  Behavioral Response: Appropriate  Type of Therapy: Psycho-education Group  Summary of Progress: Pt learned about low self-esteem and how it begins and effects mood and behaviors today.   Carman Ching, LCSW

## 2012-11-06 NOTE — Progress Notes (Signed)
Patient ID: Danielle Cox, female   DOB: 04/09/1960, 51 y.o.   MRN: [id number] D:  Michaelene Song (rep from [employer] [facility] [phone] ext [phone]) phoned and left vm re: pt's care going forward.  Ms. Linton Rump  Voiced that she felt that patient would be better served by attending a partial program while receiving ECT, instead of IOP.  Stated that pt needs more structure and pt hasn't improved since starting MH-IOP.  "She is still quite tearful and isolative."  A:  Phoned Ms. Talbert back and left vm that this Clinical research associate will discuss with Dr. Rutherford Limerick.

## 2012-11-06 NOTE — Progress Notes (Signed)
    Daily Group Progress Note  Program: IOP  Group Time: 9:00 am -12:00 pm  Participation Level: Active  Behavioral Response: Appropriate  Type of Therapy:  Process Group  Summary of Progress: member participated in two goodbye ceremonies for members ending the group and had the opportunity for healthy closure. Pt identified healthy forms of grieving losses.      Buffie Herne E, LCSW 

## 2012-11-06 NOTE — Progress Notes (Signed)
    Daily Group Progress Note  Program: IOP  Group Time: 9:00 am -12:00 pm  Participation Level: Active  Behavioral Response: Appropriate  Type of Therapy:  Process Group  Summary of Progress: Pt learned about the symptoms of depression and how the symptoms are present in their daily living and spoke about each symptom they are experiencing.        Xin Klawitter E, LCSW 

## 2012-11-07 ENCOUNTER — Other Ambulatory Visit (HOSPITAL_COMMUNITY): Payer: Medicare Other | Admitting: Psychiatry

## 2012-11-07 DIAGNOSIS — F319 Bipolar disorder, unspecified: Secondary | ICD-10-CM

## 2012-11-10 ENCOUNTER — Other Ambulatory Visit (HOSPITAL_COMMUNITY): Payer: Medicare Other

## 2012-11-10 NOTE — Progress Notes (Signed)
    Daily Group Progress Note  Program: IOP  Group Time: 9:00-10:30 am   Participation Level: Active  Behavioral Response: Appropriate  Type of Therapy:  Process Group  Summary of Progress: Pt continues to report no progress with depression symptoms and is scheduled for an ECT treatment on Monday. Pt is tearful and unable to control her emotions.      Group Time: 10:30 am - 12:00 pm   Participation Level:  None  Behavioral Response: none  Type of Therapy: Psycho-education Group  Summary of Progress: Group ended an hour early due to Clinical research associate having a family emergency.   Carman Ching, LCSW

## 2012-11-11 ENCOUNTER — Other Ambulatory Visit (HOSPITAL_COMMUNITY): Payer: Medicare Other | Admitting: Psychiatry

## 2012-11-11 DIAGNOSIS — F319 Bipolar disorder, unspecified: Secondary | ICD-10-CM

## 2012-11-12 ENCOUNTER — Other Ambulatory Visit (HOSPITAL_COMMUNITY): Payer: Medicare Other

## 2012-11-12 NOTE — Progress Notes (Signed)
    Daily Group Progress Note  Program: IOP  Group Time: 9:00-10:30 am   Participation Level: Active  Behavioral Response: Appropriate  Type of Therapy:  Process Group  Summary of Progress:  Pt participated in discussion what makes group effective and processed barriers to maximizing progress in a group setting. Pt spoke about ways they could "sabotage their wellness" by not committing fully to the process and not being fully honest with what they struggles are.       Group Time: 10:30 am - 12:00 pm   Participation Level:  Active  Behavioral Response: Appropriate  Type of Therapy: Psycho-education Group  Summary of Progress:Pt learned about feelings, the causes and identified a current feeling getting in the way of them feeling fulfilled and identified what message that feeling is trying to send them regarding changes that need to be made.  Cierrah Dace E, LCSW 

## 2012-11-13 ENCOUNTER — Other Ambulatory Visit (HOSPITAL_COMMUNITY): Payer: Medicare Other | Admitting: Psychiatry

## 2012-11-13 DIAGNOSIS — F319 Bipolar disorder, unspecified: Secondary | ICD-10-CM

## 2012-11-13 NOTE — Progress Notes (Signed)
Patient ID: COREE Cox, female   DOB: 1960-09-29, 52 y.o.   MRN: [id number] D: This is a 52 yr old married, caucasian female, who was transitioned from [facility], treatment for depressive and anxiety symptoms with SI (plan: OD). According to pt, she was recently hospitalized ~ two weeks at [facility]. Pt c/o sadness, panic attacks, increased isolation, poor sleep, and decreased appetite. Denied HI, or A/V hallucinations. Denied any previous suicide attempts or gestures. Admits to being hospitalized at [facility] several years ago due to Bipolar Disorder. Reports being diagnosed with Bipolar D/O in August 1988. Has been on disability for a couple of years d/t the illness. Family History: Deceased parents, deceased sister, and brother, all struggled with depression and ETOH.  Trigger/Stressor: 1) 50 yr old daughter moved back home a couple of months ago due to coaching a La[employer]rosse team. Also, brought a friend, along with a dog to live with pt. The friend is also coaching at an area school.  2) Medical: Chronic Bronchitis. Pt admits to smoking one pack of cigarettes per day.  Pt completed MH-IOP today.  Reports feeling the same.  Denies SI/HI or A/V hallucinations.  States not feeling any better (ie  Poor sleep, poor appetite, decreased concentration, no energy, irritable, decreased self esteem, sadness, and indecisive.) When asked if the groups helped.  Pt stated that none of the groups helped her.  Pt continues to be very tearful. A:  D/C today.  F/U with continued ECT maintenance.  Also, f/u with Vear Clock, NP and Dr. Elza Rafter.  Pt states she will schedule her follow up appointments.  Encouraged support groups.  Provided pt with [facility] partial program brochure.  Encouraged pt to go to partial.  R:  Pt receptive.

## 2012-11-13 NOTE — Patient Instructions (Signed)
Patient completed MH-IOP today.  Will continue with maintenance ECT.  Will follow up with Vear Clock, NP and Dr. Elza Rafter.  Encouraged attending a partial program.

## 2012-11-13 NOTE — Progress Notes (Signed)
    Daily Group Progress Note  Program: IOP  Group Time: 9:00-10:30 am   Participation Level: None  Behavioral Response: Resistant  Type of Therapy:  Process Group  Summary of Progress: Today was Pts scheduled discharge day. Pt reports "no improvement in mood" and was crying uncontrollably the entire time. Pt was not attentive to the group process and appeared to be stuck in her own head. Pt was hopeless and helpless and does not show motivation or a desire to be ready to make changes with her depression. She appears to be in a victim role.      Group Time: 10:30 am - 12:00 pm   Participation Level:  Active  Behavioral Response: Appropriate  Type of Therapy: Psycho-education Group  Summary of Progress:  Pt participated in a goodbye ceremony for two members ending the group today and practiced having healthy closure and grieving losses.   Carman Ching, LCSW

## 2012-11-14 ENCOUNTER — Other Ambulatory Visit (HOSPITAL_COMMUNITY): Payer: Medicare Other

## 2012-11-14 NOTE — Progress Notes (Signed)
Discharge Note  Patient:  Danielle Cox is an 52 y.o., female DOB:  1960-06-12  Date of Admission:10 -- 13-14 Date of Discharge: 11/13/12  Reason for Admission: Depression, patient had been hospitalized at [facility] for severe depression with suicidal ideation and was stabilized there and transferred here for continued stabilization.  Hospital Course: Patient started IOP and was continued on her medications which trazodone 375 mg every day and Remeron 15 mg at bedtime, left due to 80 mg every 8 a.m. And Klonopin 1 mg at bedtime. Patient continued to be depressed sad and tearful felt the groups were not helping her felt hopeless and helpless. Because of this her outpatient provider recommended ECT and patient started ECT treatments at [facility].  Mental Status at Discharge: Alert, oriented x3, affect was blunted mood was depressed speech was monosyllabic, no suicidal or homicidal ideation was noted. No hallucinations or delusions. Recent and remote memory was fair, judgment and insight were poor concentration and recall was poor  Lab Results: No results found for this or any previous visit (from the past 48 hour(s)).  Current outpatient prescriptions:budesonide-formoterol (SYMBICORT) 80-4.5 MCG/ACT inhaler, Inhale 2 puffs into the lungs 2 (two) times daily., Disp: , Rfl: ;  clonazePAM (KLONOPIN) 1 MG tablet, Take 1 mg by mouth at bedtime as needed for anxiety., Disp: , Rfl: ;  lurasidone (LATUDA) 80 MG TABS tablet, Take by mouth daily with breakfast., Disp: , Rfl:  mirtazapine (REMERON) 15 MG tablet, Take 0.5-1 tablets (7.5-15 mg total) by mouth at bedtime., Disp: 30 tablet, Rfl: 0;  traZODone (DESYREL) 50 MG tablet, Take 375 mg po q hs, Disp: 30 tablet, Rfl: 0  Axis Diagnosis:   Axis I: Anxiety Disorder NOS and Major Depression, Recurrent severe Axis II: Cluster B Traits Axis III:  Past Medical History  Diagnosis Date  . Anxiety   . Depression   . Bipolar disorder   . Bronchitis, chronic     Axis IV: other psychosocial or environmental problems, problems related to social environment and problems with primary support group Axis V: 51-60 moderate symptoms   Level of Care:  OP  Discharge destination:  Home  Is patient on multiple antipsychotic therapies at discharge:  No    Has Patient had three or more failed trials of antipsychotic monotherapy by history:  No  Patient phone:  [phone] (home)  Patient address:   [address] [place] [address],   Follow-up recommendations:  Activity:  As tolerated Diet:  Regular Other:  Followup for medications with nurse practitioner Alden Hipp. Lloyd Huger qand Dr Elie Confer stein andfor therapy  Comments:  None  The patient received suicide prevention pamphlet:    Margit Banda 11/14/2012, 12:55 PM

## 2012-11-17 ENCOUNTER — Other Ambulatory Visit (HOSPITAL_COMMUNITY): Payer: Medicare Other

## 2013-04-23 DIAGNOSIS — F609 Personality disorder, unspecified: Secondary | ICD-10-CM | POA: Insufficient documentation

## 2016-02-17 ENCOUNTER — Ambulatory Visit (INDEPENDENT_AMBULATORY_CARE_PROVIDER_SITE_OTHER): Payer: Medicare Other | Admitting: Family Medicine

## 2016-02-17 ENCOUNTER — Encounter: Payer: Self-pay | Admitting: Family Medicine

## 2016-02-17 VITALS — BP 128/82 | HR 95 | Temp 98.5°F | Ht 65.0 in | Wt 157.0 lb

## 2016-02-17 DIAGNOSIS — F319 Bipolar disorder, unspecified: Secondary | ICD-10-CM

## 2016-02-17 DIAGNOSIS — E663 Overweight: Secondary | ICD-10-CM | POA: Diagnosis not present

## 2016-02-17 DIAGNOSIS — Z1239 Encounter for other screening for malignant neoplasm of breast: Secondary | ICD-10-CM

## 2016-02-17 DIAGNOSIS — Z1231 Encounter for screening mammogram for malignant neoplasm of breast: Secondary | ICD-10-CM | POA: Diagnosis not present

## 2016-02-17 DIAGNOSIS — Z1211 Encounter for screening for malignant neoplasm of colon: Secondary | ICD-10-CM | POA: Diagnosis not present

## 2016-02-17 DIAGNOSIS — F17200 Nicotine dependence, unspecified, uncomplicated: Secondary | ICD-10-CM | POA: Diagnosis not present

## 2016-02-17 NOTE — Progress Notes (Signed)
Pre visit review using our clinic review tool, if applicable. No additional management support is needed unless otherwise documented below in the visit note. 

## 2016-02-17 NOTE — Patient Instructions (Signed)
I will work on scheduling your colonoscopy and mammogram. Please make an appointment for a physical with PAP.

## 2016-02-18 ENCOUNTER — Encounter: Payer: Self-pay | Admitting: Family Medicine

## 2016-02-18 NOTE — Progress Notes (Signed)
Danielle MartinezSheila K Cox is a 56 y.o. [facility]   History of Present Illness:    1. Current every day smoker: Not ready to quit. Hx chronic bronchitis. Currently taking Doxy for sinusitis and induced headache. No fever, productive cough, CP, SOB, wheeze, or edema.    2. Bipolar 1 disorder, depressed (HCC): Followed by Psych. Hx of ECT. Severe depression. Undergoing medication adjustments. No SI/HI. No ETOH.    3. Overweight (BMI 25.0-29.9) : No exercise. Not watching diet.     Health Maintenance Due  Topic Date Due  . Hepatitis C Screening  12/[birth year]/1962  . HIV Screening  06/09/1977  . TETANUS/TDAP  06/09/1981  . PAP SMEAR  06/10/1983  . COLONOSCOPY  06/09/2012  . MAMMOGRAM  01/27/2014    PMHx, SurgHx, SocialHx, Medications, and Allergies were reviewed in the Visit Navigator and updated as appropriate.    Past Medical History:  Diagnosis Date  . Anxiety   . Bipolar disorder (HCC)   . Bronchitis, chronic (HCC)   . Depression   . Personality disorder     Past Surgical History:  Procedure Laterality Date  . CESAREAN SECTION  1983, 1988, 1992  . TONSILLECTOMY  age 56    Family History  Problem Relation Age of Onset  . Depression Mother   . Alcohol abuse Mother   . Depression Father   . Alcohol abuse Father   . Alcohol abuse Sister   . Depression Sister   . Alcohol abuse Brother   . Depression Brother     Social History  Substance Use Topics  . Smoking status: Current Every Day Smoker    Packs/day: 1.00    Types: Cigarettes  . Smokeless tobacco: Never Used  . Alcohol use No     Current Medications and Allergies:    Current Outpatient Prescriptions:  .  clonazePAM (KLONOPIN) 1 MG tablet, Take 1 mg by mouth at bedtime as needed for anxiety., Disp: , Rfl:  .  desvenlafaxine (PRISTIQ) 100 MG 24 hr tablet, Take 200 mg by mouth., Disp: , Rfl:  .  divalproex (DEPAKOTE) 500 MG DR tablet, Take 1,000 mg by mouth., Disp: , Rfl:  .  doxycycline  (VIBRA-TABS) 100 MG tablet, Take 100 mg by mouth., Disp: , Rfl:  .  fluticasone (FLONASE) 50 MCG/ACT nasal spray, 2 sprays by Each Nare route daily., Disp: , Rfl:  .  methylphenidate (RITALIN) 10 MG tablet, Take 10 mg by mouth., Disp: , Rfl:  .  SUMAtriptan (IMITREX) 50 MG tablet, Take 1 tab when headache starts and repeat in 2 hours if headache persists. Maximum 200 mg/day., Disp: , Rfl:  .  traZODone (DESYREL) 50 MG tablet, Take 375 mg po q hs, Disp: 30 tablet, Rfl: 0   Allergies  Allergen Reactions  . Penicillins Itching    REACTION: rash  . Moxifloxacin Hcl Palpitations    REACTION: tachycardia      Patient Information Form: Screening and ROS    Do you feel safe in relationships? yes PHQ-2: not done  Review of Systems  General:  Negative for nexplained weight loss, fever Skin: Negative for new or changing mole, sore that won't heal HEENT: Negative for trouble hearing, trouble seeing, ringing in ears, mouth sores, hoarseness, change in voice, dysphagia CV:  Negative for chest pain, dyspnea, edema, palpitations Resp: Negative for cough, dyspnea, hemoptysis GI: Negative for nausea, vomiting, diarrhea, constipation, abdominal pain, melena, hematochezia GU: Negative for dysuria, incontinence, urinary hesitance, hematuria, vaginal or  penile discharge, polyuria, sexual difficulty, lumps in testicle or breasts MSK: Negative for muscle cramps or aches, joint pain or swelling Neuro: Negative for weakness, numbness, dizziness, passing out/fainting   Vitals:   Vitals:   02/17/16 0752  BP: 128/82  Pulse: 95  Temp: 98.5 F (36.9 C)  TempSrc: Oral  SpO2: 98%  Weight: 157 lb (71.2 kg)  Height: 5\' 5"  (1.651 m)     Body mass index is 26.13 kg/m.   Physical Exam:    General: Alert, cooperative, appears stated age and no distress.  HEENT:  Normocephalic, without obvious abnormality, atraumatic. Conjunctivae/corneas clear. PERRL, EOM's intact. Normal TM's and external ear  canals both ears. Nares normal. Septum midline. Mucosa normal. No drainage or sinus tenderness. Lips, mucosa, and tongue normal; teeth and gums normal.  Lungs: Clear to auscultation bilaterally.  Heart:: Regular rate and rhythm, S1, S2 normal, no murmur, click, rub or gallop.  Abdomen: Soft, non-tender; bowel sounds normal; no masses,  no organomegaly.  Extremities: Extremities normal, atraumatic, no cyanosis or edema.  Pulses: 2+ and symmetric.  Skin: Skin color, texture, turgor normal. No rashes or lesions.  Neurologic: Alert and oriented X 3, normal strength and tone. Normal symmetric. reflexes. Normal coordination and gait.  Psych: Alert,oriented. Flat affect.        Assessment and Plan:    Danielle Cox was seen today for establish care.  Diagnoses and all orders for this visit:  Current every day smoker Comments: Cessation: The patient was counseled on the dangers of tobacco use, and was reluctant to quit.  Reviewed strategies to maximize success, including removing cigarettes and smoking materials from environment.  Bipolar 1 disorder, depressed (HCC) Comments: Followed by Psych. Going through several medication changes at this time. No SI/HI.  Overweight (BMI 25.0-29.9) Comments: BMI 26. Reviewed exercise recommendations.   Colon cancer screening -     Ambulatory referral to Gastroenterology  Breast cancer screening -     MM DIAG BREAST TOMO BILATERAL; Future   . Reviewed expectations re: course of current medical issues. . Discussed self-management of symptoms. . Outlined signs and symptoms indicating need for more acute intervention. . Patient verbalized understanding and all questions were answered. . See orders for this visit as documented in the electronic medical record. . Patient received an After Visit Summary.  Records requested if needed. I spent 45 minutes with this patient, greater than 50% was face-to-face time counseling regarding the above  diagnoses.    Danielle Cox, D.O. [place] 02/18/2016   Follow-up: Return in about 2 weeks (around 03/02/2016) for a physical exam WITH PAP. Health Maintenance ordered with intent to be completed prior to that visit.

## 2016-02-20 ENCOUNTER — Other Ambulatory Visit: Payer: Self-pay | Admitting: Family Medicine

## 2016-02-20 DIAGNOSIS — Z1239 Encounter for other screening for malignant neoplasm of breast: Secondary | ICD-10-CM

## 2016-03-02 ENCOUNTER — Other Ambulatory Visit (HOSPITAL_COMMUNITY)
Admission: RE | Admit: 2016-03-02 | Discharge: 2016-03-02 | Disposition: A | Discharge status: Home / Self Care | Payer: Medicare Other | Source: Ambulatory Visit | Attending: Family Medicine | Admitting: Family Medicine

## 2016-03-02 ENCOUNTER — Encounter: Payer: Self-pay | Admitting: Family Medicine

## 2016-03-02 ENCOUNTER — Ambulatory Visit (INDEPENDENT_AMBULATORY_CARE_PROVIDER_SITE_OTHER): Payer: Medicare Other

## 2016-03-02 ENCOUNTER — Ambulatory Visit (INDEPENDENT_AMBULATORY_CARE_PROVIDER_SITE_OTHER): Payer: Medicare Other | Admitting: Family Medicine

## 2016-03-02 VITALS — BP 132/84 | HR 104 | Temp 98.7°F | Ht 65.0 in | Wt 152.6 lb

## 2016-03-02 DIAGNOSIS — Z113 Encounter for screening for infections with a predominantly sexual mode of transmission: Secondary | ICD-10-CM | POA: Diagnosis present

## 2016-03-02 DIAGNOSIS — R079 Chest pain, unspecified: Secondary | ICD-10-CM

## 2016-03-02 DIAGNOSIS — F17200 Nicotine dependence, unspecified, uncomplicated: Secondary | ICD-10-CM | POA: Diagnosis not present

## 2016-03-02 DIAGNOSIS — Z1151 Encounter for screening for human papillomavirus (HPV): Secondary | ICD-10-CM | POA: Insufficient documentation

## 2016-03-02 DIAGNOSIS — Z01419 Encounter for gynecological examination (general) (routine) without abnormal findings: Secondary | ICD-10-CM | POA: Insufficient documentation

## 2016-03-02 DIAGNOSIS — Z0000 Encounter for general adult medical examination without abnormal findings: Secondary | ICD-10-CM

## 2016-03-02 DIAGNOSIS — E782 Mixed hyperlipidemia: Secondary | ICD-10-CM

## 2016-03-02 DIAGNOSIS — E663 Overweight: Secondary | ICD-10-CM

## 2016-03-02 DIAGNOSIS — Z124 Encounter for screening for malignant neoplasm of cervix: Secondary | ICD-10-CM | POA: Diagnosis not present

## 2016-03-02 LAB — LIPID PANEL
Cholesterol: 249 mg/dL — ABNORMAL HIGH (ref 0–200)
HDL: 41.5 mg/dL (ref >39.00)
NonHDL: 207.31
Total CHOL/HDL Ratio: 6
Triglycerides: 206 mg/dL — ABNORMAL HIGH (ref 0.0–149.0)
VLDL: 41.2 mg/dL — ABNORMAL HIGH (ref 0.0–40.0)

## 2016-03-02 LAB — LDL CHOLESTEROL, DIRECT: Direct LDL: 179 mg/dL

## 2016-03-02 NOTE — Progress Notes (Signed)
Pre visit review using our clinic review tool, if applicable. No additional management support is needed unless otherwise documented below in the visit note. 

## 2016-03-02 NOTE — Progress Notes (Signed)
Subjective:    Danielle Cox is a 56 y.o. female and is here for a comprehensive physical exam.  Chest pain. Onset was 2 weeks ago. Symptoms have intermittent since that time. The patient describes the pain as dull and does not radiate. Patient rates pain as a 2/10 in intensity. Associated symptoms are: dyspnea. Aggravating factors are: exertion. Alleviating factors are: rest. Patient's cardiac risk factors are: sedentary lifestyle and smoking/ tobacco exposure. Patient's risk factors for DVT/PE: none. Previous cardiac testing: none.   Past Surgical History:  Procedure Laterality Date  . CESAREAN SECTION  1983, 1988, 1992  . TONSILLECTOMY  age 12   Social History   Social History  . Marital status: Married    Spouse name: N/A  . Number of children: N/A  . Years of education: N/A   Social History Main Topics  . Smoking status: Current Every Day Smoker    Packs/day: 1.00    Types: Cigarettes  . Smokeless tobacco: Never Used  . Alcohol use No  . Drug use: No  . Sexual activity: Yes    Partners: Male    Birth control/ protection: None   Other Topics Concern  . None   Social History Narrative   10/29/2012 [name] Roberto was born and grew up in [place], [place]. She has 2 brothers and one sister. She reports that her childhood was "poor," and explains that she needs both financially and emotionally. She completed the 10th grade, then achieved her GED. She has been married twice. The first time at age 11, and that ended after 1 year. She is currently married to her second husband of 27 years. She has 2 sons and one daughter. She is currently unemployed and on disability for 2 years. She lives with her husband, her daughter, and one of her daughter's friends. She denies any legal difficulties. She reports that she is spiritual but not religious. Her hobbies include reading and cross stitch. She reports that she has no social support network. 10/29/2012 [name]    Family History  Problem  Relation Age of Onset  . Depression Mother   . Alcohol abuse Mother   . Depression Father   . Alcohol abuse Father   . Alcohol abuse Sister   . Depression Sister   . Alcohol abuse Brother   . Depression Brother    Allergies  Allergen Reactions  . Penicillins Itching    REACTION: rash  . Moxifloxacin Hcl Palpitations    REACTION: tachycardia   Outpatient Medications Prior to Visit  Medication Sig Dispense Refill  . clonazePAM (KLONOPIN) 1 MG tablet Take 1 mg by mouth at bedtime as needed for anxiety.    Marland Kitchen desvenlafaxine (PRISTIQ) 100 MG 24 hr tablet Take 200 mg by mouth.    . divalproex (DEPAKOTE) 500 MG DR tablet Take 1,000 mg by mouth.    . fluticasone (FLONASE) 50 MCG/ACT nasal spray 2 sprays by Each Nare route daily.    . methylphenidate (RITALIN) 10 MG tablet Take 10 mg by mouth.    . SUMAtriptan (IMITREX) 50 MG tablet Take 1 tab when headache starts and repeat in 2 hours if headache persists. Maximum 200 mg/day.    . traZODone (DESYREL) 50 MG tablet Take 375 mg po q hs 30 tablet 0    Review of Symptoms:  Patient reports no vision/hearing changes, adenopathy, fever, weight change,  persistant/recurrent hoarseness, swallowing issues, palpitations, edema, persistant/recurrent cough, hemoptysis, gastrointestinal bleeding (melena, rectal bleeding), abdominal pain, significant heartburn, bowel  changes, GU symptoms (dysuria, hematuria, incontinence), GYN symptoms (abnormal  bleeding, pain), syncope, focal weakness, memory loss, numbness & tingling, skin/hair/nail changes, abnormal bruising or bleeding.   Objective:   BP 132/84   Pulse (!) 104   Temp 98.7 F (37.1 C) (Oral)   Ht 5\' 5"  (1.651 m)   Wt 152 lb 9.6 oz (69.2 kg)   LMP 02/16/2016   SpO2 97%   BMI 25.39 kg/m  Body mass index is 25.39 kg/m.   General Appearance:    Alert, cooperative, no distress, appears stated age  Head:    Normocephalic, without obvious abnormality, atraumatic  Eyes:    PERRL,  conjunctiva/corneas clear, EOM's intact, fundi    benign, both eyes  Ears:    Normal TM's and external ear canals, both ears  Nose:   Nares normal, septum midline, mucosa normal, no drainage    or sinus tenderness  Throat:   Lips, mucosa, and tongue normal; teeth and gums normal  Neck:   Supple, symmetrical, trachea midline, no adenopathy;    thyroid:  no enlargement/tenderness/nodules; no carotid   bruit or JVD  Back:     Symmetric, no curvature, ROM normal, no CVA tenderness  Lungs:     Clear to auscultation bilaterally, respirations unlabored  Chest Wall:    No tenderness or deformity   Heart:    Regular rate and rhythm, S1 and S2 normal, no murmur, rub   or gallop  Abdomen:     Soft, non-tender, bowel sounds active all four quadrants,    no masses, no organomegaly  Genitalia:    Normal female without lesion, discharge or tenderness  Extremities:   Extremities normal, atraumatic, no cyanosis or edema  Pulses:   2+ and symmetric all extremities  Skin:   Skin color, texture, turgor normal, no rashes or lesions  Lymph nodes:   Cervical, supraclavicular, and axillary nodes normal  Neurologic:   CNII-XII intact, normal strength, sensation and reflexes    throughout    EKG: normal sinus rhythm, nonspecific T abnormality.   Assessment/Plan:    Danielle Cox was seen today for annual exam.  Diagnoses and all orders for this visit:  Encounter for routine history and physical exam in female patient Comments: Recent labs from hospital reviewed. No FLP available. She refuses HM immunizations today.   Chest pain on exertion -     EKG 12-Lead  -     DG Chest 2 View - no acute findings -     Lipid panel - pending -     Ambulatory referral to Cardiology  Screening for cervical cancer Comments: PAP completed today. Orders: -     Cytology - PAP  Current every day smoker Comments: Not ready to quit. 1 ppd.  Overweight (BMI 25.0-29.9) Comments: Encouraged exercise.   Well Adult  Exam: Labs ordered: YES. Patient counseling was done. See below for items discussed. Discussed the patient's BMI.  The BMI BMI is in the acceptable range Follow up 3-6 months or as needed. Breast cancer screening: UPCOMING MAMMOGRAM ALREADY ORDERED. Cervical cancer screening: COMPLETED TODAY    Patient Counseling: []    Nutrition: Stressed importance of moderation in sodium/caffeine intake, saturated fat and cholesterol, caloric balance, sufficient intake of fresh fruits, vegetables, fiber, calcium, iron, and 1 mg of folate supplement per day (for females capable of pregnancy).  [x]    Stressed the importance of regular exercise.   [x]    Substance Abuse: Discussed cessation/primary prevention of tobacco, alcohol, or other drug use;  driving or other dangerous activities under the influence; availability of treatment for abuse.   [x]    Injury prevention: Discussed safety belts, safety helmets, smoke detector, smoking near bedding or upholstery.   []    Sexuality: Discussed sexually transmitted diseases, partner selection, use of condoms, avoidance of unintended pregnancy  and contraceptive alternatives.  [x]    Dental health: Discussed importance of regular tooth brushing, flossing, and dental visits.  [x]    Health maintenance and immunizations reviewed. Please refer to Health maintenance section.

## 2016-03-04 NOTE — Progress Notes (Signed)
Results for orders placed or performed in visit on 03/02/16  Lipid panel  Result Value Ref Range   Cholesterol 249 (H) 0 - 200 mg/dL   Triglycerides 161.0206.0 (H) 0.0 - 149.0 mg/dL   HDL 96.0441.50 >54.09>39.00 mg/dL   VLDL 81.141.2 (H) 0.0 - 91.440.0 mg/dL   Total CHOL/HDL Ratio 6    NonHDL 207.31   LDL cholesterol, direct  Result Value Ref Range   Direct LDL 179.0 mg/dL   The ASCVD Risk score = 10.3% ADD ASA = 9.3% ADD STATIN = 7.0% STOP SMOKING = 5.1%  Will ask patient to add daily ASA. She will be following up with Cardiology for the Chest Pain. Will hold on starting STATIN until Cardiology can evaluate and speak with her. Allergy concern, with Hx of palpitations when she took Avelox. Benefit outweighs risk here if cleared by pharmacy and patient agrees to trial.  Helane RimaErica Mahir Prabhakar, D.O. Family [facility], [facility]

## 2016-03-05 LAB — CYTOLOGY - PAP
Adequacy: ABSENT
Bacterial vaginitis: NEGATIVE
Candida vaginitis: NEGATIVE
Chlamydia: NEGATIVE
Diagnosis: NEGATIVE
HPV: NOT DETECTED
Neisseria Gonorrhea: NEGATIVE
Trichomonas: NEGATIVE

## 2016-03-09 LAB — CERVICOVAGINAL ANCILLARY ONLY: Herpes: NEGATIVE

## 2016-03-22 ENCOUNTER — Ambulatory Visit: Payer: Self-pay

## 2016-05-01 ENCOUNTER — Telehealth: Payer: Self-pay | Admitting: Family Medicine

## 2016-05-01 NOTE — Telephone Encounter (Signed)
Patient is scheduled to have labs  A1C Vit D Vit B12 CMP  On 05/02/16 @ 9am. Please put in order for labs.

## 2016-05-01 NOTE — Telephone Encounter (Signed)
Can you please place lab orders for patient to have for behavorial health.

## 2016-05-02 ENCOUNTER — Other Ambulatory Visit: Payer: Self-pay | Admitting: Radiology

## 2016-05-02 ENCOUNTER — Other Ambulatory Visit (INDEPENDENT_AMBULATORY_CARE_PROVIDER_SITE_OTHER): Payer: Medicare Other

## 2016-05-02 ENCOUNTER — Other Ambulatory Visit: Payer: Self-pay | Admitting: Surgical

## 2016-05-02 DIAGNOSIS — I10 Essential (primary) hypertension: Secondary | ICD-10-CM | POA: Diagnosis not present

## 2016-05-02 LAB — VITAMIN B12: Vitamin B-12: 285 pg/mL (ref 211–911)

## 2016-05-02 LAB — COMPREHENSIVE METABOLIC PANEL
ALT: 16 U/L (ref 0–35)
AST: 12 U/L (ref 0–37)
Albumin: 4 g/dL (ref 3.5–5.2)
Alkaline Phosphatase: 42 U/L (ref 39–117)
BUN: 17 mg/dL (ref 6–23)
CO2: 26 mEq/L (ref 19–32)
Calcium: 9.1 mg/dL (ref 8.4–10.5)
Chloride: 109 mEq/L (ref 96–112)
Creatinine, Ser: 0.71 mg/dL (ref 0.40–1.20)
GFR: 91.21 mL/min (ref >60.00)
Glucose, Bld: 110 mg/dL — ABNORMAL HIGH (ref 70–99)
Potassium: 3.7 mEq/L (ref 3.5–5.1)
Sodium: 141 mEq/L (ref 135–145)
Total Bilirubin: 0.3 mg/dL (ref 0.2–1.2)
Total Protein: 6.3 g/dL (ref 6.0–8.3)

## 2016-05-02 LAB — HEMOGLOBIN A1C: Hgb A1c MFr Bld: 5.7 % (ref 4.6–6.5)

## 2016-05-02 LAB — VITAMIN D 25 HYDROXY (VIT D DEFICIENCY, FRACTURES): VITD: 27.69 ng/mL — ABNORMAL LOW (ref 30.00–100.00)

## 2016-05-02 NOTE — Addendum Note (Signed)
Addended by: Josefa Half on: 05/02/2016 09:42 AM   Modules accepted: Orders

## 2016-05-21 ENCOUNTER — Encounter: Payer: Self-pay | Admitting: Family Medicine

## 2016-07-24 ENCOUNTER — Ambulatory Visit (INDEPENDENT_AMBULATORY_CARE_PROVIDER_SITE_OTHER): Payer: Medicare Other | Admitting: Family Medicine

## 2016-07-24 ENCOUNTER — Encounter: Payer: Self-pay | Admitting: Family Medicine

## 2016-07-24 VITALS — BP 126/78 | HR 108 | Temp 99.0°F | Ht 65.0 in | Wt 155.2 lb

## 2016-07-24 DIAGNOSIS — J329 Chronic sinusitis, unspecified: Secondary | ICD-10-CM | POA: Diagnosis not present

## 2016-07-24 DIAGNOSIS — B9689 Other specified bacterial agents as the cause of diseases classified elsewhere: Secondary | ICD-10-CM

## 2016-07-24 DIAGNOSIS — F17200 Nicotine dependence, unspecified, uncomplicated: Secondary | ICD-10-CM | POA: Diagnosis not present

## 2016-07-24 MED ORDER — PREDNISONE 5 MG (21) PO TBPK: ORAL_TABLET | ORAL | 0 refills | Status: DC

## 2016-07-24 MED ORDER — AZITHROMYCIN 250 MG PO TABS: ORAL_TABLET | ORAL | 0 refills | Status: DC

## 2016-07-24 NOTE — Progress Notes (Signed)
ZEN FELLING is a 56 y.o. female here for an acute visit.  History of Present Illness:   Insurance claims handler, CMA, acting as scribe for Dr. Earlene Plater.  Sinusitis  This is a recurrent problem. The current episode started 1 to 4 weeks ago. The problem has been gradually worsening since onset. There has been no fever. The pain is moderate. Associated symptoms include congestion, ear pain, sinus pressure and a sore throat. Pertinent negatives include no chills, neck pain, shortness of breath or sneezing. Past treatments include oral decongestants. The treatment provided no relief.   PMHx, SurgHx, SocialHx, Medications, and Allergies were reviewed in the Visit Navigator and updated as appropriate.  Current Medications:   Current Outpatient Prescriptions:  .  busPIRone (BUSPAR) 10 MG tablet, Take 10 mg by mouth 3 (three) times daily., Disp: , Rfl:  .  Cariprazine HCl (VRAYLAR) 6 MG CAPS, Take 6 mg by mouth daily., Disp: , Rfl:  .  doxepin (SINEQUAN) 75 MG capsule, , Disp: , Rfl: 0 .  LORazepam (ATIVAN) 1 MG tablet, Take 1 mg by mouth 3 (three) times daily., Disp: , Rfl:  .  PARoxetine (PAXIL) 20 MG tablet, , Disp: , Rfl: 0 .  trihexyphenidyl (ARTANE) 2 MG tablet, Take 2 mg by mouth 3 (three) times daily with meals., Disp: , Rfl:    Allergies  Allergen Reactions  . Penicillins Rash  . Moxifloxacin Hcl Palpitations   Review of Systems:   Review of Systems  Constitutional: Negative for chills.  HENT: Positive for congestion, ear pain, sinus pressure and sore throat. Negative for sneezing.   Respiratory: Negative for shortness of breath.   Musculoskeletal: Negative for neck pain.   Vitals:   Vitals:   07/24/16 1545  BP: 126/78  Pulse: (!) 108  Temp: 99 F (37.2 C)  TempSrc: Oral  SpO2: 96%  Weight: 155 lb 3.2 oz (70.4 kg)  Height: 5\' 5"  (1.651 m)     Body mass index is 25.83 kg/m.  Physical Exam:   Physical Exam  Constitutional: She appears well-nourished.  HENT:  Head:  Normocephalic and atraumatic.  Nose: Mucosal edema and rhinorrhea present. Right sinus exhibits maxillary sinus tenderness. Left sinus exhibits maxillary sinus tenderness.  Eyes: EOM are normal. Pupils are equal, round, and reactive to light.  Neck: Normal range of motion. Neck supple.  Cardiovascular: Normal rate, regular rhythm, normal heart sounds and intact distal pulses.   Pulmonary/Chest: Effort normal.  Abdominal: Soft.  Skin: Skin is warm.  Psychiatric: She has a normal mood and affect. Her behavior is normal.  Nursing note and vitals reviewed.   Assessment and Plan:   Brianni was seen today for acute visit.  Diagnoses and all orders for this visit:  Bacterial sinusitis -     azithromycin (ZITHROMAX) 250 MG tablet; Take 2 tablets on the first day, then 1 tablet daily for 4 days -     predniSONE (STERAPRED UNI-PAK 21 TAB) 5 MG (21) TBPK tablet; 6, 5, 4, 3, 2, 1, off  Current every day smoker Comments: I advised patient to quit smoking, and offered support. Victor QUITLINE: 1-800-QUIT-NOW 989 596 0949).    . Reviewed expectations re: course of current medical issues. . Discussed self-management of symptoms. . Outlined signs and symptoms indicating need for more acute intervention. . Patient verbalized understanding and all questions were answered. Marland Kitchen Health Maintenance issues including appropriate healthy diet, exercise, and smoking avoidance were discussed with patient. . See orders for this visit as documented in the  electronic medical record. . Patient received an After Visit Summary.  CMA served as Neurosurgeonscribe during this visit. History, Physical, and Plan performed by medical provider. The above documentation has been reviewed and is accurate and complete. Helane RimaErica Fredna Stricker, D.O.  Helane RimaErica Rosselyn Martha, DO [place], [facility] 07/25/2016  No future appointments.

## 2016-07-27 ENCOUNTER — Telehealth: Payer: Self-pay

## 2016-07-27 NOTE — Telephone Encounter (Signed)
Patient states that none of her symptoms have improved since starting medication 3 days ago.  Per Jarold MottoSamantha Worley, PA, patient should push fluids and get lots of rest over the weekend.  If patient is not feeling better on Monday, Lelon MastSamantha would be happy to see her.  Patient verbalized understanding and will call back on Monday if she isn't feeling better.

## 2016-07-30 ENCOUNTER — Telehealth: Payer: Self-pay | Admitting: Family Medicine

## 2016-07-30 DIAGNOSIS — J3489 Other specified disorders of nose and nasal sinuses: Secondary | ICD-10-CM

## 2016-07-30 NOTE — Telephone Encounter (Signed)
Spoke with patient, she is still having a severe HA, dry mouth, sinus pain, dizziness, pressure in ears. No known fever, cough, chest congestion. Nothing comes out when she blows her nose. FYI I am leaving the office now to go to a referral class. Autumn and [place] are here.

## 2016-07-30 NOTE — Telephone Encounter (Signed)
Called and left VM for pt to call the office.  

## 2016-07-30 NOTE — Telephone Encounter (Signed)
Per Gwynne EdingerSierra/Debbie Gessner, pt would need to be seen in the office for re-evaluation of her sx since Dr. Earlene PlaterWallace is out of the office today. Called pt and advised. She will wait to see what Dr. Earlene PlaterWallace is willing to do for her when she returns to the office tomorrow.

## 2016-07-30 NOTE — Telephone Encounter (Signed)
Please give information re: patient's current symptoms.

## 2016-07-30 NOTE — Telephone Encounter (Signed)
Still having sinus irritation after acute visit 07/10. Does not want to come in for visit. Scripts not helping, would like something else.  Thank you,  -LL

## 2016-08-01 MED ORDER — CLINDAMYCIN HCL 300 MG PO CAPS: 300.0000 mg | ORAL_CAPSULE | Freq: Three times a day (TID) | ORAL | 0 refills | Status: DC

## 2016-08-01 MED ORDER — FLUTICASONE PROPIONATE 50 MCG/ACT NA SUSP: 2.0000 | Freq: Every day | NASAL | 6 refills | Status: DC

## 2016-08-01 NOTE — Telephone Encounter (Signed)
Okay to call in clindamycin (300 mg TID x 10 days) and flonase. Come back in for evaluation if not improving.

## 2016-08-01 NOTE — Telephone Encounter (Signed)
Patient is calling to report same symptoms and would like a call back b/c she still hasn't received any instruction since speaking to [place] on the 16th.  Thank you,  -LL

## 2016-08-01 NOTE — Telephone Encounter (Signed)
Will forward to Dr. Earlene PlaterWallace.

## 2016-08-01 NOTE — Telephone Encounter (Signed)
Sent RX to the pharmacy and notified patient.

## 2016-11-20 DIAGNOSIS — F314 Bipolar disorder, current episode depressed, severe, without psychotic features: Secondary | ICD-10-CM | POA: Insufficient documentation

## 2016-12-10 ENCOUNTER — Other Ambulatory Visit (INDEPENDENT_AMBULATORY_CARE_PROVIDER_SITE_OTHER): Payer: Medicare Other

## 2016-12-10 DIAGNOSIS — F609 Personality disorder, unspecified: Secondary | ICD-10-CM

## 2016-12-10 DIAGNOSIS — F319 Bipolar disorder, unspecified: Secondary | ICD-10-CM

## 2016-12-10 LAB — BASIC METABOLIC PANEL
BUN: 10 mg/dL (ref 6–23)
CO2: 28 mEq/L (ref 19–32)
Calcium: 9.3 mg/dL (ref 8.4–10.5)
Chloride: 107 mEq/L (ref 96–112)
Creatinine, Ser: 0.62 mg/dL (ref 0.40–1.20)
GFR: 106.41 mL/min (ref >60.00)
Glucose, Bld: 113 mg/dL — ABNORMAL HIGH (ref 70–99)
Potassium: 4.5 mEq/L (ref 3.5–5.1)
Sodium: 142 mEq/L (ref 135–145)

## 2016-12-11 LAB — CARBAMAZEPINE LEVEL, TOTAL: Carbamazepine Lvl: 15.4 mg/L — ABNORMAL HIGH (ref 4.0–12.0)

## 2016-12-12 ENCOUNTER — Encounter (HOSPITAL_COMMUNITY): Payer: Self-pay

## 2016-12-12 ENCOUNTER — Emergency Department (HOSPITAL_COMMUNITY)
Admission: EM | Admit: 2016-12-12 | Discharge: 2016-12-13 | Disposition: A | Discharge status: Other Acute Inpatient Hospital | Payer: Medicare Other | Attending: Emergency Medicine | Admitting: Emergency Medicine

## 2016-12-12 ENCOUNTER — Other Ambulatory Visit: Payer: Self-pay

## 2016-12-12 DIAGNOSIS — T1491XA Suicide attempt, initial encounter: Secondary | ICD-10-CM

## 2016-12-12 DIAGNOSIS — F314 Bipolar disorder, current episode depressed, severe, without psychotic features: Secondary | ICD-10-CM

## 2016-12-12 DIAGNOSIS — Z79899 Other long term (current) drug therapy: Secondary | ICD-10-CM | POA: Insufficient documentation

## 2016-12-12 DIAGNOSIS — I10 Essential (primary) hypertension: Secondary | ICD-10-CM | POA: Insufficient documentation

## 2016-12-12 DIAGNOSIS — T424X2A Poisoning by benzodiazepines, intentional self-harm, initial encounter: Secondary | ICD-10-CM | POA: Insufficient documentation

## 2016-12-12 DIAGNOSIS — F17210 Nicotine dependence, cigarettes, uncomplicated: Secondary | ICD-10-CM | POA: Insufficient documentation

## 2016-12-12 LAB — RAPID URINE DRUG SCREEN, HOSP PERFORMED
AMPHETAMINES: NOT DETECTED
BARBITURATES: NOT DETECTED
Benzodiazepines: POSITIVE — AB
Cocaine: NOT DETECTED
Opiates: NOT DETECTED
Tetrahydrocannabinol: NOT DETECTED

## 2016-12-12 LAB — COMPREHENSIVE METABOLIC PANEL
ALT: 20 U/L (ref 14–54)
ANION GAP: 6 (ref 5–15)
AST: 23 U/L (ref 15–41)
Albumin: 3.6 g/dL (ref 3.5–5.0)
Alkaline Phosphatase: 57 U/L (ref 38–126)
BUN: 8 mg/dL (ref 6–20)
CHLORIDE: 108 mmol/L (ref 101–111)
CO2: 26 mmol/L (ref 22–32)
CREATININE: 0.64 mg/dL (ref 0.44–1.00)
Calcium: 9.1 mg/dL (ref 8.9–10.3)
GFR calc non Af Amer: >60 mL/min (ref >60)
Glucose, Bld: 94 mg/dL (ref 65–99)
Potassium: 4.5 mmol/L (ref 3.5–5.1)
SODIUM: 140 mmol/L (ref 135–145)
Total Bilirubin: 0.9 mg/dL (ref 0.3–1.2)
Total Protein: 6.1 g/dL — ABNORMAL LOW (ref 6.5–8.1)

## 2016-12-12 LAB — CBC
HCT: 42 % (ref 36.0–46.0)
HEMOGLOBIN: 14.3 g/dL (ref 12.0–15.0)
MCH: 30.6 pg (ref 26.0–34.0)
MCHC: 34 g/dL (ref 30.0–36.0)
MCV: 89.9 fL (ref 78.0–100.0)
Platelets: 335 10*3/uL (ref 150–400)
RBC: 4.67 MIL/uL (ref 3.87–5.11)
RDW: 15.8 % — ABNORMAL HIGH (ref 11.5–15.5)
WBC: 10.7 10*3/uL — ABNORMAL HIGH (ref 4.0–10.5)

## 2016-12-12 LAB — CBG MONITORING, ED: GLUCOSE-CAPILLARY: 95 mg/dL (ref 65–99)

## 2016-12-12 LAB — ETHANOL: Alcohol, Ethyl (B): <10 mg/dL (ref <10)

## 2016-12-12 LAB — ACETAMINOPHEN LEVEL: Acetaminophen (Tylenol), Serum: <10 ug/mL — ABNORMAL LOW (ref 10–30)

## 2016-12-12 LAB — I-STAT BETA HCG BLOOD, ED (MC, WL, AP ONLY)

## 2016-12-12 LAB — SALICYLATE LEVEL: Salicylate Lvl: <7 mg/dL (ref 2.8–30.0)

## 2016-12-12 MED ORDER — SODIUM CHLORIDE 0.9 % IV BOLUS (SEPSIS)
1000.0000 mL | Freq: Once | INTRAVENOUS | Status: AC
  Administered 2016-12-12: 1000 mL via INTRAVENOUS

## 2016-12-12 NOTE — ED Notes (Signed)
Spoke to poison control, recommends IV fluids and monitoring until stable and psych consult completed.

## 2016-12-12 NOTE — ED Notes (Signed)
Spoke to [place] at poison control. Monitor pt for SNS depression, respiratory depression, hypertension, hypothermia, supportive care, iv fluids for hypotension, recommend EKG and 4 hr tylenol level and aspirin. Discharge when asymptomatic and psychiatric issues have been addressed.

## 2016-12-12 NOTE — ED Provider Notes (Signed)
[facility]-EMERGENCY DEPT Provider Note   CSN: [id number] Arrival date & time: 12/12/16  1732     History   Chief Complaint Chief Complaint  Patient presents with  . Suicide Attempt    HPI Danielle Cox is a 56 y.o. female. Chief complaint is suicidal, ingestion.  HPI:  56 year old female. Ingested 5 mg of Valium today. She states that "maybe 100". States she gets these prescribed for anxiety. Felt suicidal this morning. Took the medications. Called her husband this afternoon. She states she still wants to die stated she wanted to talk to him first. She is uncertain if she wanted help. She is here wall anteriorly.  Past Medical History:  Diagnosis Date  . Anxiety   . Bipolar disorder (HCC)   . Bronchitis, chronic (HCC)   . Depression   . Personality disorder [facility](HCC)     Patient Active Problem List   Diagnosis Date Noted  . Overweight (BMI 25.0-29.9) 03/02/2016  . Personality disorder (HCC) 04/23/2013  . Bipolar 1 disorder, depressed (HCC) 09/29/2012  . Current every day smoker 02/17/2009  . Essential hypertension 02/17/2009    Past Surgical History:  Procedure Laterality Date  . CESAREAN SECTION  1983, 1988, 1992  . TONSILLECTOMY  age 56    OB History    No data available       Home Medications    Prior to Admission medications   Medication Sig Start Date End Date Taking? Authorizing Provider  carbamazepine (CARBATROL) 300 MG 12 hr capsule TAKE 2 CAPSULES BY MOUTH 3 TIMES DAILY 11/27/16  Yes [provider]  cholecalciferol (VITAMIN D) 1000 units tablet Take 1,000 Units by mouth daily. 11/27/16  Yes [provider]  diazepam (VALIUM) 5 MG tablet Take 5 mg by mouth as directed. Take 1 tablet TID as needed and Then Take 1 tablet at bedtime. 12/05/16  Yes [provider]  fluticasone (FLONASE) 50 MCG/ACT nasal spray Place 2 sprays into both nostrils daily. 08/01/16  Yes Helane RimaWallace, Erica, DO  Multiple Vitamin  (MULTIVITAMIN WITH MINERALS) TABS tablet Take 1 tablet by mouth daily.   Yes [provider]  QUEtiapine (SEROQUEL XR) 300 MG 24 hr tablet Take 300 mg by mouth at bedtime.  12/05/16  Yes [provider]  azithromycin (ZITHROMAX) 250 MG tablet Take 2 tablets on the first day, then 1 tablet daily for 4 days Patient not taking: Reported on 12/12/2016 07/24/16   Helane RimaWallace, Erica, DO  clindamycin (CLEOCIN) 300 MG capsule Take 1 capsule (300 mg total) by mouth 3 (three) times daily. Patient not taking: Reported on 12/12/2016 08/01/16   Helane RimaWallace, Erica, DO  PARoxetine (PAXIL) 20 MG tablet  07/04/16   [provider]  predniSONE (STERAPRED UNI-PAK 21 TAB) 5 MG (21) TBPK tablet 6, 5, 4, 3, 2, 1, off Patient not taking: Reported on 12/12/2016 07/24/16   Helane RimaWallace, Erica, DO    Family History Family History  Problem Relation Age of Onset  . Depression Mother   . Alcohol abuse Mother   . Depression Father   . Alcohol abuse Father   . Alcohol abuse Sister   . Depression Sister   . Alcohol abuse Brother   . Depression Brother     Social History Social History   Tobacco Use  . Smoking status: Current Every Day Smoker    Packs/day: 1.00    Types: Cigarettes  . Smokeless tobacco: Never Used  Substance Use Topics  . Alcohol use: No  . Drug  use: No     Allergies   Penicillins and Moxifloxacin hcl   Review of Systems Review of Systems  Constitutional: Negative for appetite change, chills, diaphoresis, fatigue and fever.  HENT: Negative for mouth sores, sore throat and trouble swallowing.   Eyes: Negative for visual disturbance.  Respiratory: Negative for cough, chest tightness, shortness of breath and wheezing.   Cardiovascular: Negative for chest pain.  Gastrointestinal: Negative for abdominal distention, abdominal pain, diarrhea, nausea and vomiting.  Endocrine: Negative for polydipsia, polyphagia and polyuria.  Genitourinary: Negative for dysuria, frequency and  hematuria.  Musculoskeletal: Negative for gait problem.  Skin: Negative for color change, pallor and rash.  Neurological: Negative for dizziness, syncope, light-headedness and headaches.  Hematological: Does not bruise/bleed easily.  Psychiatric/Behavioral: Positive for self-injury and suicidal ideas. Negative for behavioral problems and confusion.     Physical Exam Updated Vital Signs BP 125/74   Pulse 77   Temp 98.2 F (36.8 C) (Oral)   Resp 15   LMP 11/21/2016 (Approximate)   SpO2 97%   Physical Exam  Constitutional: She appears well-developed and well-nourished. No distress.  HENT:  Head: Normocephalic and atraumatic.  Eyes: Conjunctivae are normal.  Neck: Neck supple.  Cardiovascular: Normal rate and regular rhythm.  No murmur heard. Pulmonary/Chest: Effort normal and breath sounds normal. No respiratory distress.  Abdominal: Soft. There is no tenderness.  Musculoskeletal: She exhibits no edema.  Neurological:  Sleeping. Awakens to voice. Does not wheeze airway protection. Not sonorous.  Skin: Skin is warm and dry.  Psychiatric:  Emotionally labile. Cries during conversation and interview  Nursing note and vitals reviewed.    ED Treatments / Results  Labs (all labs ordered are listed, but only abnormal results are displayed) Labs Reviewed  COMPREHENSIVE METABOLIC PANEL - Abnormal; Notable for the following components:      Result Value   Total Protein 6.1 (*)    All other components within normal limits  CBC - Abnormal; Notable for the following components:   WBC 10.7 (*)    RDW 15.8 (*)    All other components within normal limits  RAPID URINE DRUG SCREEN, HOSP PERFORMED - Abnormal; Notable for the following components:   Benzodiazepines POSITIVE (*)    All other components within normal limits  ACETAMINOPHEN LEVEL - Abnormal; Notable for the following components:   Acetaminophen (Tylenol), Serum <10 (*)    All other components within normal limits    ETHANOL  SALICYLATE LEVEL  CBG MONITORING, ED  I-STAT BETA HCG BLOOD, ED ([facility], [facility], [facility] ONLY)    EKG  EKG Interpretation None       Radiology No results found.  Procedures Procedures (including critical care time)  Medications Ordered in ED Medications  sodium chloride 0.9 % bolus 1,000 mL (1,000 mLs Intravenous New Bag/Given 12/12/16 2237)     Initial Impression / Assessment and Plan / ED Course  I have reviewed the triage vital signs and the nursing notes.  Pertinent labs & imaging results that were available during my care of the patient were reviewed by me and considered in my medical decision making (see chart for details).    Medically clear. Hemolytically stable. Is still sedate but able to answer questions. Will likely need overnight obscure reevaluation once her acute ingestion has cleared. Awaiting behavioral health evaluation.  Final Clinical Impressions(s) / ED Diagnoses   Final diagnoses:  Suicide attempt [facility](HCC)    ED Discharge Orders    None  Rolland Porter, MD 12/12/16 2350

## 2016-12-12 NOTE — ED Notes (Signed)
In-out wasn't done due to pt went to the restroom and void

## 2016-12-12 NOTE — ED Triage Notes (Signed)
She told EMS, who were phoned by pt's. Husband that she wished to commit suicide by taking some 120  5 mg diazepam tablets. She further states she took them at ~1100 hours today. She then had second thoughts, and phoned her husband and told him what she had done. She arrives here drowsy and in no distress.

## 2016-12-12 NOTE — ED Notes (Signed)
Bed: RESB Expected date:  Expected time:  Means of arrival:  Comments: EMS overdose 

## 2016-12-12 NOTE — ED Notes (Signed)
Angela......  

## 2016-12-13 ENCOUNTER — Encounter (HOSPITAL_COMMUNITY): Payer: Self-pay

## 2016-12-13 ENCOUNTER — Inpatient Hospital Stay (HOSPITAL_COMMUNITY)
Admission: AD | Admit: 2016-12-13 | Discharge: 2016-12-28 | DRG: 885 | Disposition: A | Discharge status: Home / Self Care | Payer: Medicare Other | Source: Intra-hospital | Attending: Psychiatry | Admitting: Psychiatry

## 2016-12-13 ENCOUNTER — Other Ambulatory Visit: Payer: Self-pay

## 2016-12-13 DIAGNOSIS — G4710 Hypersomnia, unspecified: Secondary | ICD-10-CM | POA: Diagnosis present

## 2016-12-13 DIAGNOSIS — Z811 Family history of alcohol abuse and dependence: Secondary | ICD-10-CM

## 2016-12-13 DIAGNOSIS — G2581 Restless legs syndrome: Secondary | ICD-10-CM | POA: Diagnosis present

## 2016-12-13 DIAGNOSIS — F4000 Agoraphobia, unspecified: Secondary | ICD-10-CM | POA: Diagnosis present

## 2016-12-13 DIAGNOSIS — Z880 Allergy status to penicillin: Secondary | ICD-10-CM

## 2016-12-13 DIAGNOSIS — G4700 Insomnia, unspecified: Secondary | ICD-10-CM | POA: Diagnosis not present

## 2016-12-13 DIAGNOSIS — Z62810 Personal history of physical and sexual abuse in childhood: Secondary | ICD-10-CM | POA: Diagnosis present

## 2016-12-13 DIAGNOSIS — I10 Essential (primary) hypertension: Secondary | ICD-10-CM | POA: Diagnosis present

## 2016-12-13 DIAGNOSIS — R450 Nervousness: Secondary | ICD-10-CM | POA: Diagnosis not present

## 2016-12-13 DIAGNOSIS — E663 Overweight: Secondary | ICD-10-CM | POA: Diagnosis present

## 2016-12-13 DIAGNOSIS — R109 Unspecified abdominal pain: Secondary | ICD-10-CM | POA: Diagnosis not present

## 2016-12-13 DIAGNOSIS — F332 Major depressive disorder, recurrent severe without psychotic features: Principal | ICD-10-CM | POA: Diagnosis present

## 2016-12-13 DIAGNOSIS — Z6827 Body mass index (BMI) 27.0-27.9, adult: Secondary | ICD-10-CM | POA: Diagnosis not present

## 2016-12-13 DIAGNOSIS — R2681 Unsteadiness on feet: Secondary | ICD-10-CM | POA: Diagnosis present

## 2016-12-13 DIAGNOSIS — Z818 Family history of other mental and behavioral disorders: Secondary | ICD-10-CM

## 2016-12-13 DIAGNOSIS — Z604 Social exclusion and rejection: Secondary | ICD-10-CM | POA: Diagnosis present

## 2016-12-13 DIAGNOSIS — F603 Borderline personality disorder: Secondary | ICD-10-CM | POA: Diagnosis present

## 2016-12-13 DIAGNOSIS — F4310 Post-traumatic stress disorder, unspecified: Secondary | ICD-10-CM | POA: Diagnosis not present

## 2016-12-13 DIAGNOSIS — R42 Dizziness and giddiness: Secondary | ICD-10-CM | POA: Diagnosis not present

## 2016-12-13 DIAGNOSIS — Z915 Personal history of self-harm: Secondary | ICD-10-CM | POA: Diagnosis not present

## 2016-12-13 DIAGNOSIS — Z881 Allergy status to other antibiotic agents status: Secondary | ICD-10-CM

## 2016-12-13 DIAGNOSIS — F410 Panic disorder [episodic paroxysmal anxiety] without agoraphobia: Secondary | ICD-10-CM | POA: Diagnosis present

## 2016-12-13 DIAGNOSIS — E7800 Pure hypercholesterolemia, unspecified: Secondary | ICD-10-CM | POA: Diagnosis present

## 2016-12-13 DIAGNOSIS — E781 Pure hyperglyceridemia: Secondary | ICD-10-CM | POA: Diagnosis present

## 2016-12-13 DIAGNOSIS — Z79899 Other long term (current) drug therapy: Secondary | ICD-10-CM | POA: Diagnosis not present

## 2016-12-13 DIAGNOSIS — X58XXXA Exposure to other specified factors, initial encounter: Secondary | ICD-10-CM | POA: Diagnosis present

## 2016-12-13 DIAGNOSIS — F314 Bipolar disorder, current episode depressed, severe, without psychotic features: Secondary | ICD-10-CM | POA: Diagnosis not present

## 2016-12-13 DIAGNOSIS — F17210 Nicotine dependence, cigarettes, uncomplicated: Secondary | ICD-10-CM | POA: Diagnosis present

## 2016-12-13 DIAGNOSIS — F319 Bipolar disorder, unspecified: Secondary | ICD-10-CM

## 2016-12-13 DIAGNOSIS — T1491XA Suicide attempt, initial encounter: Secondary | ICD-10-CM | POA: Diagnosis not present

## 2016-12-13 DIAGNOSIS — T424X2A Poisoning by benzodiazepines, intentional self-harm, initial encounter: Secondary | ICD-10-CM | POA: Diagnosis present

## 2016-12-13 DIAGNOSIS — R45851 Suicidal ideations: Secondary | ICD-10-CM | POA: Diagnosis not present

## 2016-12-13 DIAGNOSIS — M791 Myalgia, unspecified site: Secondary | ICD-10-CM | POA: Diagnosis not present

## 2016-12-13 DIAGNOSIS — F419 Anxiety disorder, unspecified: Secondary | ICD-10-CM | POA: Diagnosis not present

## 2016-12-13 DIAGNOSIS — R4587 Impulsiveness: Secondary | ICD-10-CM | POA: Diagnosis not present

## 2016-12-13 DIAGNOSIS — J42 Unspecified chronic bronchitis: Secondary | ICD-10-CM | POA: Diagnosis present

## 2016-12-13 DIAGNOSIS — F39 Unspecified mood [affective] disorder: Secondary | ICD-10-CM | POA: Diagnosis not present

## 2016-12-13 LAB — URINALYSIS, COMPLETE (UACMP) WITH MICROSCOPIC
BILIRUBIN URINE: NEGATIVE
Glucose, UA: NEGATIVE mg/dL
KETONES UR: NEGATIVE mg/dL
LEUKOCYTES UA: NEGATIVE
NITRITE: NEGATIVE
Protein, ur: NEGATIVE mg/dL
SPECIFIC GRAVITY, URINE: 1.008 (ref 1.005–1.030)
pH: 7 (ref 5.0–8.0)

## 2016-12-13 MED ORDER — NICOTINE 21 MG/24HR TD PT24
21.0000 mg | MEDICATED_PATCH | Freq: Once | TRANSDERMAL | Status: DC
  Administered 2016-12-13: 21 mg via TRANSDERMAL
  Filled 2016-12-13: qty 1

## 2016-12-13 MED ORDER — LORAZEPAM 1 MG PO TABS
2.0000 mg | ORAL_TABLET | ORAL | Status: DC | PRN
  Administered 2016-12-13: 2 mg via ORAL
  Filled 2016-12-13: qty 2

## 2016-12-13 MED ORDER — VITAMIN D3 25 MCG (1000 UNIT) PO TABS
1000.0000 [IU] | ORAL_TABLET | Freq: Every day | ORAL | Status: DC
  Administered 2016-12-13: 1000 [IU] via ORAL
  Filled 2016-12-13: qty 1

## 2016-12-13 MED ORDER — LORAZEPAM 1 MG PO TABS
2.0000 mg | ORAL_TABLET | ORAL | Status: DC | PRN
  Administered 2016-12-13 – 2016-12-14 (×2): 2 mg via ORAL
  Filled 2016-12-13 (×2): qty 2

## 2016-12-13 MED ORDER — QUETIAPINE FUMARATE ER 300 MG PO TB24
300.0000 mg | ORAL_TABLET | Freq: Every day | ORAL | Status: DC
  Filled 2016-12-13: qty 1

## 2016-12-13 MED ORDER — CARBAMAZEPINE ER 200 MG PO TB12
300.0000 mg | ORAL_TABLET | Freq: Two times a day (BID) | ORAL | Status: DC
  Administered 2016-12-13 – 2016-12-21 (×16): 300 mg via ORAL
  Filled 2016-12-13 (×22): qty 1

## 2016-12-13 MED ORDER — NICOTINE 21 MG/24HR TD PT24
21.0000 mg | MEDICATED_PATCH | Freq: Every day | TRANSDERMAL | Status: DC
  Administered 2016-12-14 – 2016-12-28 (×15): 21 mg via TRANSDERMAL
  Filled 2016-12-13 (×18): qty 1

## 2016-12-13 MED ORDER — MAGNESIUM HYDROXIDE 400 MG/5ML PO SUSP
30.0000 mL | Freq: Every day | ORAL | Status: DC | PRN
  Administered 2016-12-17: 30 mL via ORAL
  Filled 2016-12-13: qty 30

## 2016-12-13 MED ORDER — CARBAMAZEPINE ER 100 MG PO TB12
300.0000 mg | ORAL_TABLET | Freq: Two times a day (BID) | ORAL | Status: DC
  Administered 2016-12-13: 300 mg via ORAL
  Filled 2016-12-13 (×2): qty 3

## 2016-12-13 MED ORDER — VITAMIN D3 25 MCG (1000 UNIT) PO TABS
1000.0000 [IU] | ORAL_TABLET | Freq: Every day | ORAL | Status: DC
  Administered 2016-12-14 – 2016-12-28 (×15): 1000 [IU] via ORAL
  Filled 2016-12-13 (×18): qty 1

## 2016-12-13 MED ORDER — QUETIAPINE FUMARATE ER 300 MG PO TB24
300.0000 mg | ORAL_TABLET | Freq: Every day | ORAL | Status: DC
  Administered 2016-12-13: 300 mg via ORAL
  Filled 2016-12-13 (×4): qty 1

## 2016-12-13 MED ORDER — ALUM & MAG HYDROXIDE-SIMETH 200-200-20 MG/5ML PO SUSP
30.0000 mL | ORAL | Status: DC | PRN
  Administered 2016-12-26: 30 mL via ORAL
  Filled 2016-12-13: qty 30

## 2016-12-13 NOTE — ED Notes (Signed)
Pt presents with SI, depression, ingested 120 Valium tabs.  Denies HI or AVH.  [facility] [employer].  A&O x 3, no distress noted, calm & cooperative.  Monitoring for safety, Q 15 min checks in effect.
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
## 2016-12-13 NOTE — Progress Notes (Signed)
Danielle Cox is a [age] year old female being admitted voluntarily [facility].  She came to the ED for suicidal ideation and ingestion of unknown amount of Valium.  She has history of previous suicide attempt in the past.  She reported ongoing depression, poor appetite, poor sleep and feeling hopeless.  She denied HI or A/V hallucinations.  She has history of chronic bronchitis.  She is diagnosed with Major Depressive Disorder.  [facility] she was very tearful, sad, anxious, confused and hopeless.  She continued to report suicidal ideation but will contract for safety on the unit.  She was very vague with her answers and was minimal with her interactions.  She denies any pain or discomfort.  She denies HI or A/V hallucinations.  She reported that she was very fearful and paranoid but was unable to explain more.  Oriented her to the unit.  Admission paperwork completed and signed.  Belongings searched and secured in locker # 33.  Skin assessment completed and no skin issues noted.  Q 15 minute checks initiated for safety.  We will monitor the progress towards her goals.

## 2016-12-13 NOTE — Progress Notes (Signed)
Nursing Progress Note: 7p-7a D: Pt currently presents with a tearful/minimal/depressed/sad affect and behavior. Pt states "I am just so scared of being lost. I am so cold." Patient did not respond to most questions. Pt too tearful to answer once initiating conversation.. Pt reports "terrible" sleep during the previous night with current medication regimen.   A: Pt provided with medications per providers orders. Pt's labs and vitals were monitored throughout the night. Pt supported emotionally and encouraged to express concerns and questions. Pt educated on medications.  R: Pt's safety ensured with 15 minute and environmental checks. Pt currently denies SI, HI, and AVH. Pt verbally contracts to seek staff if SI,HI, or AVH occurs and to consult with staff before acting on any harmful thoughts. Will continue to monitor.

## 2016-12-13 NOTE — Progress Notes (Signed)
12/13/16 1400:  LRT introduced self and offered activities, pt declined.   Caroll RancherMarjette Martie Muhlbauer, LRT/CTRS

## 2016-12-13 NOTE — Tx Team (Signed)
Initial Treatment Plan 12/13/2016 11:09 PM Danielle MartinezSheila K Ciampa JXB:[id number]    PATIENT STRESSORS: Financial difficulties Traumatic event Other: chronic depression   PATIENT STRENGTHS: General fund of knowledge Physical Health Supportive family/friends   PATIENT IDENTIFIED PROBLEMS: Depression  Suicidal ideation  "I want to get my mind back straight"  "I want to be able to clean my house"               DISCHARGE CRITERIA:  Improved stabilization in mood, thinking, and/or behavior Verbal commitment to aftercare and medication compliance  PRELIMINARY DISCHARGE PLAN: Outpatient therapy Medication management  PATIENT/FAMILY INVOLVEMENT: This treatment plan has been presented to and reviewed with the patient, Danielle MartinezSheila K Cox.  The patient and family have been given the opportunity to ask questions and make suggestions.  Levin BaconHeather V Jalal Rauch, RN 12/13/2016, 11:09 PM

## 2016-12-13 NOTE — ED Notes (Signed)
Pts husband came to visit patient.  He gave some collateral info on her history.  He said she usually does not participate during inpatient hospitalizations,  She does not drive or get out of house after treatment .  She has had many ECT treatments with no results.  Husband is very supportive of patient.  Pt is not participating in conversation.  She is tearful and states she is scared of being alone and cannot function in day to day life.  15 minute checks and video monitoring continue.

## 2016-12-13 NOTE — ED Notes (Signed)
Pt discharged safely with [place] driver.  Pt was very flat and withdrawn.  All belongings were sent with patient.

## 2016-12-13 NOTE — BH Assessment (Signed)
[facility]HH Assessment Progress Note  Per Thedore MinsMojeed Akintayo, MD, this pt requires psychiatric hospitalization at this time.  Berneice Heinrichina Tate, RN, [facility]  Pt has signed Voluntary Admission and Consent for Treatment, as well as Consent to Release Information to Ellis SavageLisa Poulos, pt's provider, and a notification call has been placed.  Signed [facility].  Pt's nurse, Kendal Hymendie, has been notified, and agrees to send original paperwork along with pt via [facility] [place], and to call report to [phone].  Doylene Canninghomas Onnie Alatorre, MA Triage Specialist [phone]

## 2016-12-14 DIAGNOSIS — T1491XA Suicide attempt, initial encounter: Secondary | ICD-10-CM

## 2016-12-14 DIAGNOSIS — Z811 Family history of alcohol abuse and dependence: Secondary | ICD-10-CM

## 2016-12-14 DIAGNOSIS — R4587 Impulsiveness: Secondary | ICD-10-CM

## 2016-12-14 DIAGNOSIS — Z818 Family history of other mental and behavioral disorders: Secondary | ICD-10-CM

## 2016-12-14 DIAGNOSIS — F410 Panic disorder [episodic paroxysmal anxiety] without agoraphobia: Secondary | ICD-10-CM

## 2016-12-14 DIAGNOSIS — F17210 Nicotine dependence, cigarettes, uncomplicated: Secondary | ICD-10-CM

## 2016-12-14 DIAGNOSIS — R450 Nervousness: Secondary | ICD-10-CM

## 2016-12-14 DIAGNOSIS — F314 Bipolar disorder, current episode depressed, severe, without psychotic features: Secondary | ICD-10-CM

## 2016-12-14 DIAGNOSIS — T424X2A Poisoning by benzodiazepines, intentional self-harm, initial encounter: Secondary | ICD-10-CM

## 2016-12-14 DIAGNOSIS — Z62810 Personal history of physical and sexual abuse in childhood: Secondary | ICD-10-CM

## 2016-12-14 DIAGNOSIS — F419 Anxiety disorder, unspecified: Secondary | ICD-10-CM

## 2016-12-14 DIAGNOSIS — F4310 Post-traumatic stress disorder, unspecified: Secondary | ICD-10-CM

## 2016-12-14 MED ORDER — ADULT MULTIVITAMIN W/MINERALS CH
1.0000 | ORAL_TABLET | Freq: Every day | ORAL | Status: DC
  Administered 2016-12-14 – 2016-12-28 (×15): 1 via ORAL
  Filled 2016-12-14 (×18): qty 1

## 2016-12-14 MED ORDER — VITAMIN B-1 100 MG PO TABS
100.0000 mg | ORAL_TABLET | Freq: Every day | ORAL | Status: DC
  Administered 2016-12-15 – 2016-12-28 (×14): 100 mg via ORAL
  Filled 2016-12-14 (×16): qty 1

## 2016-12-14 MED ORDER — ONDANSETRON 4 MG PO TBDP
4.0000 mg | ORAL_TABLET | Freq: Four times a day (QID) | ORAL | Status: AC | PRN
  Administered 2016-12-15: 4 mg via ORAL
  Filled 2016-12-14: qty 1

## 2016-12-14 MED ORDER — QUETIAPINE FUMARATE ER 200 MG PO TB24
200.0000 mg | ORAL_TABLET | Freq: Every day | ORAL | Status: DC
  Administered 2016-12-14 – 2016-12-15 (×2): 200 mg via ORAL
  Filled 2016-12-14 (×5): qty 1

## 2016-12-14 MED ORDER — LORAZEPAM 1 MG PO TABS
1.0000 mg | ORAL_TABLET | Freq: Four times a day (QID) | ORAL | Status: AC | PRN
  Administered 2016-12-14 – 2016-12-17 (×5): 1 mg via ORAL
  Filled 2016-12-14 (×5): qty 1

## 2016-12-14 MED ORDER — HYDROXYZINE HCL 25 MG PO TABS
25.0000 mg | ORAL_TABLET | Freq: Four times a day (QID) | ORAL | Status: DC | PRN
  Administered 2016-12-14 – 2016-12-16 (×3): 25 mg via ORAL
  Filled 2016-12-14 (×3): qty 1

## 2016-12-14 NOTE — Progress Notes (Signed)
D:Pt is confused with dizziness. She has poor eye contact and is tearful. Pt has been placed on 1:1 monitoring by MD. A:Continuous 1:1 monitoring given. Medications given as ordered. R:Safety maintained on the unit.
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
## 2016-12-14 NOTE — Progress Notes (Signed)
Pt did not attend wrap-up group   
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
## 2016-12-14 NOTE — Tx Team (Signed)
Interdisciplinary Treatment and Diagnostic Plan Update  12/14/2016 Time of Session: 4:05 PM  Danielle Cox MRN: [id number]  Principal Diagnosis: <principal problem not specified>  Secondary Diagnoses: Active Problems:   Major depressive disorder, recurrent severe without psychotic features ([place])   Current Medications:  Current Facility-Administered Medications  Medication Dose Route Frequency Provider Last Rate Last Dose  . alum & mag hydroxide-simeth (MAALOX/MYLANTA) 200-200-20 MG/5ML suspension 30 mL  30 mL Oral Q4H PRN Patrecia Pour, NP      . carbamazepine (TEGRETOL XR) 12 hr tablet 300 mg  300 mg Oral BID Patrecia Pour, NP   300 mg at 12/14/16 0810  . cholecalciferol (VITAMIN D) tablet 1,000 Units  1,000 Units Oral Daily Patrecia Pour, NP   1,000 Units at 12/14/16 0810  . LORazepam (ATIVAN) tablet 2 mg  2 mg Oral Q4H PRN Patrecia Pour, NP   2 mg at 12/14/16 1310  . magnesium hydroxide (MILK OF MAGNESIA) suspension 30 mL  30 mL Oral Daily PRN Patrecia Pour, NP      . nicotine (NICODERM CQ - dosed in mg/24 hours) patch 21 mg  21 mg Transdermal Daily Cobos, Myer Peer, MD   21 mg at 12/14/16 0810  . QUEtiapine (SEROQUEL XR) 24 hr tablet 300 mg  300 mg Oral QHS Patrecia Pour, NP   300 mg at 12/13/16 2036    PTA Medications: Medications Prior to Admission  Medication Sig Dispense Refill Last Dose  . QUEtiapine (SEROQUEL XR) 300 MG 24 hr tablet Take 300 mg by mouth at bedtime.    12/11/2016 at Unknown time    Patient Stressors: Financial difficulties Traumatic event Other: chronic depression  Patient Strengths: General fund of knowledge Physical Health Supportive family/friends  Treatment Modalities: Medication Management, Group therapy, Case management,  1 to 1 session with clinician, Psychoeducation, Recreational therapy.   Physician Treatment Plan for Primary Diagnosis: <principal problem not specified> Long Term Goal(s): Improvement in symptoms so as ready  for discharge  Short Term Goals:    Medication Management: Evaluate patient's response, side effects, and tolerance of medication regimen.  Therapeutic Interventions: 1 to 1 sessions, Unit Group sessions and Medication administration.  Evaluation of Outcomes: Progressing  Physician Treatment Plan for Secondary Diagnosis: Active Problems:   Major depressive disorder, recurrent severe without psychotic features ([place])   Long Term Goal(s): Improvement in symptoms so as ready for discharge  Short Term Goals:    Medication Management: Evaluate patient's response, side effects, and tolerance of medication regimen.  Therapeutic Interventions: 1 to 1 sessions, Unit Group sessions and Medication administration.  Evaluation of Outcomes: Progressing   RN Treatment Plan for Primary Diagnosis: <principal problem not specified> Long Term Goal(s): Knowledge of disease and therapeutic regimen to maintain health will improve  Short Term Goals: Ability to identify and develop effective coping behaviors will improve and Compliance with prescribed medications will improve  Medication Management: RN will administer medications as ordered by provider, will assess and evaluate patient's response and provide education to patient for prescribed medication. RN will report any adverse and/or side effects to prescribing provider.  Therapeutic Interventions: 1 on 1 counseling sessions, Psychoeducation, Medication administration, Evaluate responses to treatment, Monitor vital signs and CBGs as ordered, Perform/monitor CIWA, COWS, AIMS and Fall Risk screenings as ordered, Perform wound care treatments as ordered.  Evaluation of Outcomes: Progressing   LCSW Treatment Plan for Primary Diagnosis: <principal problem not specified> Long Term Goal(s): Safe transition to appropriate next level of  care at discharge, Engage patient in therapeutic group addressing interpersonal concerns.  Short Term Goals: Engage  patient in aftercare planning with referrals and resources  Therapeutic Interventions: Assess for all discharge needs, 1 to 1 time with Social worker, Explore available resources and support systems, Assess for adequacy in community support network, Educate family and significant other(s) on suicide prevention, Complete Psychosocial Assessment, Interpersonal group therapy.  Evaluation of Outcomes: Met   Return home, follow up outpt with current provider  Progress in Treatment: Attending groups: Yes Participating in groups: Yes Taking medication as prescribed: Yes Toleration medication: Yes, no side effects reported at this time Family/Significant other contact made: No Patient understands diagnosis: Yes AEB Discussing patient identified problems/goals with staff: Yes Medical problems stabilized or resolved: Yes Denies suicidal/homicidal ideation: Yes Issues/concerns per patient self-inventory: None Other: N/A  New problem(s) identified: None identified at this time.   New Short Term/Long Term Goal(s): "I want to get my mind back straight"  Discharge Plan or Barriers:   Reason for Continuation of Hospitalization: Anxiety  Depression  Medication stabilization Suicidal ideation   Estimated Length of Stay: 12/5  Attendees: Patient: Danielle Cox 12/14/2016  4:05 PM  Physician: Lanier Clam, MD 12/14/2016  4:05 PM  Nursing: Sena Hitch, RN 12/14/2016  4:05 PM  RN Care Manager: Lars Pinks, RN 12/14/2016  4:05 PM  Social Worker: Ripley Fraise 12/14/2016  4:05 PM  Recreational Therapist: Winfield Cunas 12/14/2016  4:05 PM  Other: Norberto Sorenson 12/14/2016  4:05 PM  Other:  12/14/2016  4:05 PM    Scribe for Treatment Team:  Roque Lias LCSW 12/14/2016 4:05 PM

## 2016-12-14 NOTE — [facility] Group Notes (Signed)
Pt had been recently admitted and did not attend group.

## 2016-12-14 NOTE — Progress Notes (Signed)
1:1 Nursing note - Writer spoke with patient earlier and she reported that she has been sleeping all day and if hoping she will be able to sleep tonight. She reports that she feels Scientist, research (physical sciences)woozy and writer re-educated her on fall prevention. She was informed of her scheduled medication and that the dosage was decreased from what she had taken the previous night. Safety maintained with MHT at patients side. 1:1 continues and patient is safe.

## 2016-12-14 NOTE — Progress Notes (Signed)
Recreation Therapy Notes  Date: 12/14/16 Time: 0930 Location: 300 Hall Dayroom  Group Topic: Stress Management  Goal Area(s) Addresses:  Patient will verbalize importance of using healthy stress management.  Patient will identify positive emotions associated with healthy stress management.   Intervention: Stress Management  Activity :  Progressive Muscle Relaxation.  LRT introduced the stress management technique of progressive muscle relaxation.  LRT read a script to allow patients to tense and relax each muscle group one at a time.  Education:  Stress Management, Discharge Planning.   Education Outcome: Acknowledges edcuation/In group clarification offered/Needs additional education  Clinical Observations/Feedback: Pt did not attend group.    Caroll RancherMarjette Ivannah Zody, LRT/CTRS         Caroll RancherLindsay, Corgan Mormile A 12/14/2016 12:30 PM

## 2016-12-14 NOTE — Progress Notes (Signed)
D:Pt is guarded and paranoid on the unit. She is confused and appears to be thought blocking at times. She also c/o dizziness. Pt was given prn ativan today. She fell asleep and when she woke up she said that she felt a little better. Pt's affect is paranoid and worried.  A:Offered support, encouragement and 15 minute checks. Gave medications as ordered. R:Pt verbally denies si and hi. Safety maintained on the unit.

## 2016-12-14 NOTE — Plan of Care (Signed)
No self injurious behavior observed or expressed. 

## 2016-12-15 LAB — LIPID PANEL
Cholesterol: 301 mg/dL — ABNORMAL HIGH (ref 0–200)
HDL: 48 mg/dL (ref >40)
LDL Cholesterol: 199 mg/dL — ABNORMAL HIGH (ref 0–99)
Total CHOL/HDL Ratio: 6.3 RATIO
Triglycerides: 270 mg/dL — ABNORMAL HIGH (ref <150)
VLDL: 54 mg/dL — ABNORMAL HIGH (ref 0–40)

## 2016-12-15 LAB — TSH: TSH: 1.842 u[IU]/mL (ref 0.350–4.500)

## 2016-12-15 NOTE — Plan of Care (Signed)
Patient sad and withdrawn. Verbalizes difficulty being in dayroom and especially in groups.  Reports poor sleep due to restlessness and "jitters."

## 2016-12-15 NOTE — Progress Notes (Signed)
Patient observed resting in bed with eyes closed. RR WNL, even and unlabored. NAD. No complaints. Level I obs remains in place for safety as patient is confused and unsteady in her gait. She remains safe at present.

## 2016-12-15 NOTE — Progress Notes (Signed)
1:1 Nursing note - Patient is observed sitting on the side of her bed after hearing the wake up call announcement. She had some difficulty sleeping throughout the night c/o headache. No medication given due to contraindications for receiving tylenol or ibuprofen. 1:1 continues and patient is safe.

## 2016-12-15 NOTE — Progress Notes (Signed)
1:1 Nursing note - Patient is lying in bed asleep with eyes closed and respirations even and unlabored. No distress noted, 1;1 continues and patient is safe with sitter at bedside.

## 2016-12-15 NOTE — Progress Notes (Signed)
Patient up at nurses' station, restless, tearful and anxious. Continues to stare off and displays confusion. Per MHT, patient verbalizing to her she is focused on "when she has to leave here." Worried that husband is "going to send me somewhere." Husband and son arrived for visitation Wandra Arthurs(David Bingman [phone]) who states [facility] and was discharged in relatively the same state as on admission. Husband states, "I can't stay home with her. I work full time and so does my son. She doesn't leave the house. This is how she's been over the last month. Pacing, crying. Is there somewhere she can go? Like the state hospital?" Emotional support offered, VS obtained and ativan 1mg  prn given. Reassured husband that patient would not be discharged until she's more stable. Patient encouraged to return to room to rest and visit with family. [facility] who will reassess.

## 2016-12-15 NOTE — Progress Notes (Signed)
Pt is resting in bed with her eyes open upon initial approach.  She becomes tearful while speaking to staff.  Presents with depressed affect and mood.  Pt stares blankly at the wall during conversation with Clinical research associatewriter.  She reports feeling "paranoid" from "just being here."  Denies SI/HI, hallucinations, and pain.  Pt was complaining of anxiety at change of shift and she was encouraged to take deep breaths and rest in bed.  Introduced self to pt.  Actively listened to pt and offered support and encouragement.  Medication administered per order.  1:1 staff remains with pt for safety.    Pt is safe on the unit.  She verbally contracts for safety.  Pt is compliant with medication.  Will continue to monitor and assess.

## 2016-12-15 NOTE — [facility] Group Notes (Signed)
[facility] LCSW Group Therapy Note  Date/Time:    12/15/2016 10:00-11:00AM  Type of Therapy and Topic:  Group Therapy:  Healthy Coping Skills  Participation Level:  Minimal   Description of Group:  The focus of this group was to determine what negative statements people say to themselves, and why that is an unhealthy coping skill, the end results of using that technique, and the reasons negative self-talk is so prevalent.  Commonalities between patients were pointed out.  Time was then spent teaching cognitive reframing and grounding techniques.  Therapeutic Goals 1. Patients learned that their unhealthy and negative self-talk is a common way for people with mood disorders to cope. 2. Patients identified their own particular negative self-talk tendencies. 3. Patients learned and practiced how to reframe negative statements and how to give themselves permission to not be perfect. 4. Patients provided support and ideas to each other 5. Patients learned and practiced 3 grounding techniques to return to the present moment while ruminating on past wrongs.  Summary of Patient Progress: During group, patient expressed that she is desperate, was tearful in telling the group she took pills prior to admission.  She sat quietly with obvious anxiety for the first half of group, but then ultimately asked her 1:1 to leave with her.   Therapeutic Modalities Cognitive Behavioral Therapy Motivational Interviewing

## 2016-12-15 NOTE — Progress Notes (Addendum)
[facility] MD Progress Note  12/15/2016 2:16 PM Danielle Cox  MRN:  [id number] Subjective:  Reports she feels " tired ". Also describes ongoing depression, sadness . Today denies active SI, but continues to endorse some passive thoughts . Does not endorse medication side effects at this time. Objective : I have reviewed chart notes and have met with patient. 56 year old married female, history of mood disorder, reports prior diagnosis of Bipolar Disorder, presented for depression and suicide attempt by overdosing on Valium. Today remains depressed, sad, but is less severely tearful than yesterday. Affect remains constricted. Denies active SI, endorses some passive SI. She is on one to one observation status due to severity of depression and ongoing suicidal ruminations. Reports ongoing neuro-vegetative symptoms , including low energy, poor appetite, disrupted sleep. Denies hallucinations and does not appear internally preoccupied  Labs - TSH 1.84.  Lipid profile - hypercholesterolemia, hypertriglyceridemia   Principal Problem:  Bipolar Disorder, Depressed  Diagnosis:   Patient Active Problem List   Diagnosis Date Noted  . Major depressive disorder, recurrent severe without psychotic features ([place]) [F33.2] 12/13/2016  . Overweight (BMI 25.0-29.9) [E66.3] 03/02/2016  . Personality disorder ([place]) [F60.9] 04/23/2013  . Bipolar 1 disorder, depressed ([place]) [F31.9] 09/29/2012  . Current every day smoker [F17.200] 02/17/2009  . Essential hypertension [I10] 02/17/2009   Total Time spent with patient: 20 minutes  Past Medical History:  Past Medical History:  Diagnosis Date  . Anxiety   . Bipolar disorder ([place])   . Bronchitis, chronic ([place])   . Depression   . Personality disorder [facility])     Past Surgical History:  Procedure Laterality Date  . [place]  . TONSILLECTOMY  age 72   Family History:  Family History  Problem Relation Age of Onset  . Depression Mother    . Alcohol abuse Mother   . Depression Father   . Alcohol abuse Father   . Alcohol abuse Sister   . Depression Sister   . Alcohol abuse Brother   . Depression Brother    Social History:  Social History   Substance and Sexual Activity  Alcohol Use No     Social History   Substance and Sexual Activity  Drug Use No    Social History   Socioeconomic History  . Marital status: Married    Spouse name: None  . Number of children: None  . Years of education: None  . Highest education level: None  Social Needs  . Financial resource strain: None  . Food insecurity - worry: None  . Food insecurity - inability: None  . Transportation needs - medical: None  . Transportation needs - non-medical: None  Occupational History  . None  Tobacco Use  . Smoking status: Current Every Day Smoker    Packs/day: 1.00    Types: Cigarettes  . Smokeless tobacco: Never Used  Substance and Sexual Activity  . Alcohol use: No  . Drug use: No  . Sexual activity: Yes    Partners: Male    Birth control/protection: None  Other Topics Concern  . None  Social History Narrative   10/29/2012 [name] [place] was born and grew up in [place], [place]. She has 2 brothers and one sister. She reports that her childhood was "poor," and explains that she needs both financially and emotionally. She completed the 10th grade, then achieved her GED. She has been married twice. The first time at age 62, and that ended after 1 year.  She is currently married to her second husband of 27 years. She has 2 sons and one daughter. She is currently unemployed and on disability for 2 years. She lives with her husband, her daughter, and one of her daughter's friends. She denies any legal difficulties. She reports that she is spiritual but not religious. Her hobbies include reading and cross stitch. She reports that she has no social support network. 10/29/2012 [name]   Additional Social History:   Sleep: Fair  Appetite:  Fair  Current  Medications: Current Facility-Administered Medications  Medication Dose Route Frequency Provider Last Rate Last Dose  . alum & mag hydroxide-simeth (MAALOX/MYLANTA) 200-200-20 MG/5ML suspension 30 mL  30 mL Oral Q4H PRN Patrecia Pour, NP      . carbamazepine (TEGRETOL XR) 12 hr tablet 300 mg  300 mg Oral BID Patrecia Pour, NP   300 mg at 12/15/16 8756  . cholecalciferol (VITAMIN D) tablet 1,000 Units  1,000 Units Oral Daily Patrecia Pour, NP   1,000 Units at 12/15/16 4332  . hydrOXYzine (ATARAX/VISTARIL) tablet 25 mg  25 mg Oral Q6H PRN Nhyira Leano, Myer Peer, MD   25 mg at 12/14/16 1825  . LORazepam (ATIVAN) tablet 1 mg  1 mg Oral Q6H PRN Tabari Volkert, Myer Peer, MD   1 mg at 12/15/16 0920  . magnesium hydroxide (MILK OF MAGNESIA) suspension 30 mL  30 mL Oral Daily PRN Patrecia Pour, NP      . multivitamin with minerals tablet 1 tablet  1 tablet Oral Daily Rahman Ferrall, Myer Peer, MD   1 tablet at 12/15/16 0919  . nicotine (NICODERM CQ - dosed in mg/24 hours) patch 21 mg  21 mg Transdermal Daily Donnald Tabar, Myer Peer, MD   21 mg at 12/15/16 0919  . ondansetron (ZOFRAN-ODT) disintegrating tablet 4 mg  4 mg Oral Q6H PRN Westlyn Glaza, Myer Peer, MD      . QUEtiapine (SEROQUEL XR) 24 hr tablet 200 mg  200 mg Oral QHS Mercadez Heitman, Myer Peer, MD   200 mg at 12/14/16 2130  . thiamine (VITAMIN B-1) tablet 100 mg  100 mg Oral Daily Tanasia Budzinski, Myer Peer, MD   100 mg at 12/15/16 9518    Lab Results:  Results for orders placed or performed during the hospital encounter of 12/13/16 (from the past 48 hour(s))  TSH     Status: None   Collection Time: 12/15/16  7:41 AM  Result Value Ref Range   TSH 1.842 0.350 - 4.500 uIU/mL    Comment: Performed by a 3rd Generation assay with a functional sensitivity of <=0.01 uIU/mL. Performed at [facility], [place] [address], [place], [place] [address]   Lipid panel     Status: Abnormal   Collection Time: 12/15/16  7:41 AM  Result Value Ref Range   Cholesterol 301 (H) 0 - 200  mg/dL   Triglycerides 270 (H) <150 mg/dL   HDL 48 >40 mg/dL   Total CHOL/HDL Ratio 6.3 RATIO   VLDL 54 (H) 0 - 40 mg/dL   LDL Cholesterol 199 (H) 0 - 99 mg/dL    Comment:        Total Cholesterol/HDL:CHD Risk Coronary Heart Disease Risk Table                     Men   Women  1/2 Average Risk   3.4   3.3  Average Risk       5.0   4.4  2 X Average  Risk   9.6   7.1  3 X Average Risk  23.4   11.0        Use the calculated Patient Ratio above and the CHD Risk Table to determine the patient's CHD Risk.        ATP III CLASSIFICATION (LDL):  <100     mg/dL   Optimal  100-129  mg/dL   Near or Above                    Optimal  130-159  mg/dL   Borderline  160-189  mg/dL   High  >190     mg/dL   Very High Performed at [facility] [address] [place], [place] [address]     Blood Alcohol level:  Lab Results  Component Value Date   ETH <10 12/12/2016   ETH  08/03/2009    <5        LOWEST DETECTABLE LIMIT FOR SERUM ALCOHOL IS 5 mg/dL FOR MEDICAL PURPOSES ONLY    Metabolic Disorder Labs: Lab Results  Component Value Date   HGBA1C 5.7 05/02/2016   No results found for: PROLACTIN Lab Results  Component Value Date   CHOL 301 (H) 12/15/2016   TRIG 270 (H) 12/15/2016   HDL 48 12/15/2016   CHOLHDL 6.3 12/15/2016   VLDL 54 (H) 12/15/2016   LDLCALC 199 (H) 12/15/2016   LDLCALC (H) 05/27/2008    149        Total Cholesterol/HDL:CHD Risk Coronary Heart Disease Risk Table                     Men   Women  1/2 Average Risk   3.4   3.3  Average Risk       5.0   4.4  2 X Average Risk   9.6   7.1  3 X Average Risk  23.4   11.0        Use the calculated Patient Ratio above and the CHD Risk Table to determine the patient's CHD Risk.        ATP III CLASSIFICATION (LDL):  <100     mg/dL   Optimal  100-129  mg/dL   Near or Above                    Optimal  130-159  mg/dL   Borderline  160-189  mg/dL   High  >190     mg/dL   Very High    Physical Findings: AIMS:  Facial and Oral Movements Muscles of Facial Expression: None, normal Lips and Perioral Area: None, normal Jaw: None, normal Tongue: None, normal,Extremity Movements Upper (arms, wrists, hands, fingers): None, normal Lower (legs, knees, ankles, toes): None, normal, Trunk Movements Neck, shoulders, hips: None, normal, Overall Severity Severity of abnormal movements (highest score from questions above): None, normal Incapacitation due to abnormal movements: None, normal Patient's awareness of abnormal movements (rate only patient's report): No Awareness, Dental Status Current problems with teeth and/or dentures?: No Does patient usually wear dentures?: No  CIWA:  CIWA-Ar Total: 9 COWS:  COWS Total Score: 4  Musculoskeletal: Strength & Muscle Tone: within normal limits Gait & Station: normal - gait is slow  Patient leans: N/A  Psychiatric Specialty Exam: Physical Exam  ROS denies chest pain, no shortness of breath, no vomiting   Blood pressure 119/64, pulse 76, temperature 98.4 F (36.9 C), temperature source Oral, resp. rate 16, height '5\' 3"'$  (  1.6 m), weight 70.3 kg (155 lb), last menstrual period 11/21/2016.Body mass index is 27.46 kg/m.  General Appearance: Fairly Groomed  Eye Contact:  Minimal  Speech:  decreased   Volume:  Normal  Mood:  Depressed  Affect:  constricted, but not as tearful as yesterday   Thought Process:  Linear, associations intact, concrete - some thought process slowing noted   Orientation:  Other:  she is alert, attentive, and is oriented x 3 today  Thought Content:  denies hallucinations, no delusions , ruminative about spending days " alone "  at home  Suicidal Thoughts:  Yes.  without intent/plan denies suicidal plan or intention at this time   Homicidal Thoughts:  No  Memory:  recent and remote grossly intact   Judgement:  Fair  Insight:  Fair  Psychomotor Activity:  Decreased  Concentration:  Concentration: Fair and Attention Span: Fair  Recall:   Good  Fund of Knowledge:  Good  Language:  Fair  Akathisia:  Negative  Handed:  Right  AIMS (if indicated):     Assets:  Desire for Improvement Resilience Social Support  ADL's:  fair  Cognition:  WNL  Sleep:  Number of Hours: 4.75   Assessment - patient presents depressed, sad, constricted in affect . Endorses passive SI, but denies plan or intention of hurting self . Denies hallucinations and no psychotic symptoms noted at present . She presents severely depressed, and due to persistence of some SI, she remains on one to one observation at this time. Currently denies medication side effects  Treatment Plan Summary: Daily contact with patient to assess and evaluate symptoms and progress in treatment, Medication management, Plan inpatient treatment  and medications as below  Encourage group and milieu participation to work on coping skills and symptom reduction Continue Tegretol 300 mgrs BID for mood disorder, depression Increase Seroquel to 250 mgrs QHS for mood disorder, depression Treatment team working on disposition planning options PT consult to address unsteady gait.  Carbamazepine serum level in AM  Jenne Campus, MD 12/15/2016, 2:16 PM

## 2016-12-15 NOTE — [facility] Group Notes (Signed)
[facility] Group Notes:  (Nursing/MHT/Case Management/Adjunct)  Date:  12/15/2016  Time:  1315  Type of Therapy:  Nurse Education - Reversing Negative Self Talk  Participation Level:  Did Not Attend  Participation Quality:    Affect:    Cognitive:    Insight:    Engagement in Group:    Modes of Intervention:    Summary of Progress/Problems: Patient invited however elected not to attend. States, "I'm only feeling safe in my room right now. I just want to lie down."  Lawrence MarseillesFriedman, Camille Dragan Eakes 12/15/2016, 4:38 PM
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
## 2016-12-15 NOTE — Progress Notes (Signed)
Patient sitting in on bed eating small bites of dinner. MHT at side. Patient remains flat, blank in affect, mood depressed and anxious. Remains confused. Around 1540 began to complain of not feeling safe. "I only feel safe in my room, in my bed. I want to go sit (in the dayroom) but I'm afraid. My stomach is still bothering me, too." Denies feeling suicidal but is very depressed and despondent. Patient given reassurance of her safety. Medicated with vistaril and zofran.  VS obtained and stable. CIWA presently at a "6." On reassess of prn medications, patient reported some relief. 1:1 remains in place for safety and patient is safe.

## 2016-12-16 LAB — HEMOGLOBIN A1C
Hgb A1c MFr Bld: 5.6 % (ref 4.8–5.6)
MEAN PLASMA GLUCOSE: 114 mg/dL

## 2016-12-16 LAB — CARBAMAZEPINE LEVEL, TOTAL: Carbamazepine Lvl: 6.6 ug/mL (ref 4.0–12.0)

## 2016-12-16 MED ORDER — QUETIAPINE FUMARATE ER 50 MG PO TB24
100.0000 mg | ORAL_TABLET | Freq: Every day | ORAL | Status: DC
  Administered 2016-12-16: 100 mg via ORAL
  Filled 2016-12-16: qty 1
  Filled 2016-12-16 (×3): qty 2

## 2016-12-16 MED ORDER — QUETIAPINE FUMARATE ER 50 MG PO TB24
250.0000 mg | ORAL_TABLET | Freq: Every day | ORAL | Status: DC
  Filled 2016-12-16 (×2): qty 1

## 2016-12-16 MED ORDER — ROPINIROLE HCL 0.25 MG PO TABS
0.5000 mg | ORAL_TABLET | Freq: Every day | ORAL | Status: DC
  Administered 2016-12-16 – 2016-12-18 (×3): 0.5 mg via ORAL
  Filled 2016-12-16 (×2): qty 2
  Filled 2016-12-16: qty 1
  Filled 2016-12-16: qty 2
  Filled 2016-12-16: qty 1
  Filled 2016-12-16: qty 2

## 2016-12-16 MED ORDER — HYDROXYZINE HCL 50 MG PO TABS
50.0000 mg | ORAL_TABLET | Freq: Four times a day (QID) | ORAL | Status: DC | PRN
  Administered 2016-12-16 – 2016-12-27 (×26): 50 mg via ORAL
  Filled 2016-12-16 (×26): qty 1

## 2016-12-16 MED ORDER — ROPINIROLE HCL 0.5 MG PO TABS
0.5000 mg | ORAL_TABLET | Freq: Once | ORAL | Status: AC
  Administered 2016-12-16: 0.5 mg via ORAL
  Filled 2016-12-16: qty 2
  Filled 2016-12-16: qty 1

## 2016-12-16 NOTE — Plan of Care (Signed)
Patient unable to tolerate programming at this time due to illness.  Patient continues to have difficulty processing information and education provided.

## 2016-12-16 NOTE — Progress Notes (Signed)
D: Pt is resting in her bed with eyes closed.  Respirations are even and unlabored.  No distress noted.  A: 1:1 staff remains with pt for safety.   R: Pt is safe on the unit.  Will continue to monitor and assess.

## 2016-12-16 NOTE — Progress Notes (Signed)
Patient remains preoccupied, tearful. MHT at side. Became very upset around 1330. Restless, pacing. Forwards minimal information but acknowledges, "My mind isn't right. I can't settle." Medicated with vistaril prn and emotional support provided. 1:1 remains in place for safety. On reassess patient is resting in bed with eyes closed.

## 2016-12-16 NOTE — Progress Notes (Addendum)
Patient became agitated, crying loudly around 1700, pacing in hallway.Tremulous, agitated, poor eye contact. "I don't know what I need. I'm paranoid. I'm scared." !;! MHT at side, emotional support provided. Ativan 1mg  po prn given and patient encouraged to lie down to decrease stimuli. 1:1 obs remains in place for safety. On reassess, patient is resting in bed with eyes closed at present. Remains safe.   Add note: MD, RN observed patient in hallway. Unsteady gait, unbalanced. Order received for patient to ambulate in wheelchair which was provided. Re-educated patient on fall prevention plan. Patient continues to state, "I don't know. I'm just so nervous." Unable to verbalize understanding of information provided. Will continue 1:1 for safety.

## 2016-12-16 NOTE — Progress Notes (Signed)
D: Pt was in dayroom upon initial approach.  Pt presents with anxious, depressed affect and mood.  She reports "it was not a bad day" and her goal was "to try to go in the groups and I did, I tried and I didn't understand them."  Pt denies SI/HI, denies hallucinations, denies pain.  Pt has been visible in milieu with minimal peer interactions.  Pt attended evening group.    A: Introduced self to pt.  Actively listened to pt and offered support and encouragement. Praised pt for trying to go to groups.  PO fluids encouraged and pt provided with Gatorade.  Medications administered per order.  PRN medication administered for anxiety.  1:1 staff remains with pt for safety.   R: Pt is safe on the unit.  Pt is compliant with medications.  Pt verbally contracts for safety.  Will continue to monitor and assess.

## 2016-12-16 NOTE — Progress Notes (Signed)
Patient observed resting in bed with eyes closed. RR WNL, breathing unlabored. NAD. No complaints. On assessment at AM med pass, patient status remains unchanged. Affect blank, mood anxious. Staring off, preoccupied. Continues to deny AVH but appears to be responding. States the confusion is unchanged however patient is oriented to person, place, day of the week and month. Does not know today's date. States she slept much better after receiving dose of requip last night.   1:1 obs remains in place and MHT is at bedside. Emotional support and reassurance provided. No prn's requested or needed at present. Medicated per orders. Fall prevention plan in place and reviewed as patient remains high fall risk due to unsteady gait, confusion.   Patient remains safe at present. Denies SI/HI. Will continue 1:1 for safety.

## 2016-12-16 NOTE — Progress Notes (Signed)
D: Pt is resting in bed with eyes closed.  Respirations are even and unlabored.  No distress noted.    A: 1:1 staff remains with pt for safety.   R: Pt is safe on the unit.  Will continue to monitor and assess.  

## 2016-12-16 NOTE — Progress Notes (Signed)
Psychoeducational Group Note  Date:  12/16/2016 Time:  2209  Group Topic/Focus:  Wrap-Up Group:   The focus of this group is to help patients review their daily goal of treatment and discuss progress on daily workbooks.  Participation Level: Did Not Attend  Participation Quality:  Not Applicable  Affect:  Not Applicable  Cognitive:  Not Applicable  Insight:  Not Applicable  Engagement in Group: Not Applicable  Additional Comments:  The patient did not attend group this evening since she was asleep in her bed. The patient had been encouraged to attend the group but she refused.   Hazle CocaGOODMAN, Niquita Digioia S 12/16/2016, 10:09 PM
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
## 2016-12-16 NOTE — Progress Notes (Signed)
[facility] MD Progress Note  12/16/2016 2:38 PM Danielle Cox  MRN:  [id number] Subjective:  Patient states she feels " terrible" , " very depressed".  She denies medication side effects. She denies active suicidal ideations. She reports she slept better last night, which she attributes to Requip helping to address night time leg movements and states she feels she has restless leg syndrome . Objective : I have reviewed chart notes, and have met with patient . Staff report indicates she remains depressed, often tearful, anxious .  She presents depressed, anxious, tearful, reporting severe depression and a subjective significant sense of apprehension. She endorses thoughts of wanting to die, but denies plan or intention of suicide at this time. She denies medication side effects. As noted, she reports having some restless leg symptoms at night time, and received Requip last night, which she states helped her sleep better .  Denies hallucinations, but presents vaguely paranoid and for example states that she feels uncomfortable and scared when she hears peers laughing in day room, because " I don't know what they are laughing about " .  She remains on one to one observation at present based on severity of symptoms. We have reviewed psychiatric history and patient confirms prior diagnosis of Bipolar Disorder rather than unipolar mood disorder, states she has had distinct episodes or racing thoughts , increased energy, abnormally elevated mood , but states these have been infrequent compared to her depression.  Carbamazepine serum level 6.6 ( therapeutic )    Principal Problem:  Bipolar Disorder, Depressed  Diagnosis:   Patient Active Problem List   Diagnosis Date Noted  . Major depressive disorder, recurrent severe without psychotic features ([place]) [F33.2] 12/13/2016  . Overweight (BMI 25.0-29.9) [E66.3] 03/02/2016  . Personality disorder ([place]) [F60.9] 04/23/2013  . Bipolar 1 disorder, depressed ([place])  [F31.9] 09/29/2012  . Current every day smoker [F17.200] 02/17/2009  . Essential hypertension [I10] 02/17/2009   Total Time spent with patient: 20 minutes  Past Medical History:  Past Medical History:  Diagnosis Date  . Anxiety   . Bipolar disorder ([place])   . Bronchitis, chronic ([place])   . Depression   . Personality disorder [facility])     Past Surgical History:  Procedure Laterality Date  . [place]  . TONSILLECTOMY  age 55   Family History:  Family History  Problem Relation Age of Onset  . Depression Mother   . Alcohol abuse Mother   . Depression Father   . Alcohol abuse Father   . Alcohol abuse Sister   . Depression Sister   . Alcohol abuse Brother   . Depression Brother    Social History:  Social History   Substance and Sexual Activity  Alcohol Use No     Social History   Substance and Sexual Activity  Drug Use No    Social History   Socioeconomic History  . Marital status: Married    Spouse name: None  . Number of children: None  . Years of education: None  . Highest education level: None  Social Needs  . Financial resource strain: None  . Food insecurity - worry: None  . Food insecurity - inability: None  . Transportation needs - medical: None  . Transportation needs - non-medical: None  Occupational History  . None  Tobacco Use  . Smoking status: Current Every Day Smoker    Packs/day: 1.00    Types: Cigarettes  . Smokeless tobacco: Never Used  Substance  and Sexual Activity  . Alcohol use: No  . Drug use: No  . Sexual activity: Yes    Partners: Male    Birth control/protection: None  Other Topics Concern  . None  Social History Narrative   10/29/2012 [name] Vidya was born and grew up in [place], [place]. She has 2 brothers and one sister. She reports that her childhood was "poor," and explains that she needs both financially and emotionally. She completed the 10th grade, then achieved her GED. She has been married twice. The  first time at age 89, and that ended after 1 year. She is currently married to her second husband of 27 years. She has 2 sons and one daughter. She is currently unemployed and on disability for 2 years. She lives with her husband, her daughter, and one of her daughter's friends. She denies any legal difficulties. She reports that she is spiritual but not religious. Her hobbies include reading and cross stitch. She reports that she has no social support network. 10/29/2012 [name]   Additional Social History:   Sleep: Fair  Appetite:  Fair  Current Medications: Current Facility-Administered Medications  Medication Dose Route Frequency Provider Last Rate Last Dose  . alum & mag hydroxide-simeth (MAALOX/MYLANTA) 200-200-20 MG/5ML suspension 30 mL  30 mL Oral Q4H PRN Patrecia Pour, NP      . carbamazepine (TEGRETOL XR) 12 hr tablet 300 mg  300 mg Oral BID Patrecia Pour, NP   300 mg at 12/16/16 4665  . cholecalciferol (VITAMIN D) tablet 1,000 Units  1,000 Units Oral Daily Patrecia Pour, NP   1,000 Units at 12/16/16 9935  . hydrOXYzine (ATARAX/VISTARIL) tablet 25 mg  25 mg Oral Q6H PRN Vershawn Westrup, Myer Peer, MD   25 mg at 12/16/16 1325  . LORazepam (ATIVAN) tablet 1 mg  1 mg Oral Q6H PRN Javarious Elsayed, Myer Peer, MD   1 mg at 12/15/16 1910  . magnesium hydroxide (MILK OF MAGNESIA) suspension 30 mL  30 mL Oral Daily PRN Patrecia Pour, NP      . multivitamin with minerals tablet 1 tablet  1 tablet Oral Daily Jahseh Lucchese, Myer Peer, MD   1 tablet at 12/16/16 7017  . nicotine (NICODERM CQ - dosed in mg/24 hours) patch 21 mg  21 mg Transdermal Daily Jennier Schissler, Myer Peer, MD   21 mg at 12/16/16 7939  . ondansetron (ZOFRAN-ODT) disintegrating tablet 4 mg  4 mg Oral Q6H PRN Kerby Hockley, Myer Peer, MD   4 mg at 12/15/16 1538  . QUEtiapine (SEROQUEL XR) 24 hr tablet 250 mg  250 mg Oral QHS Dontavious Emily A, MD      . rOPINIRole (REQUIP) tablet 0.5 mg  0.5 mg Oral QHS Jennamarie Goings A, MD      . thiamine (VITAMIN B-1) tablet 100  mg  100 mg Oral Daily Jaymes Hang, Myer Peer, MD   100 mg at 12/16/16 0300    Lab Results:  Results for orders placed or performed during the hospital encounter of 12/13/16 (from the past 48 hour(s))  TSH     Status: None   Collection Time: 12/15/16  7:41 AM  Result Value Ref Range   TSH 1.842 0.350 - 4.500 uIU/mL    Comment: Performed by a 3rd Generation assay with a functional sensitivity of <=0.01 uIU/mL. Performed at [facility], [place] [address], [place], [place] [address]   Lipid panel     Status: Abnormal   Collection Time: 12/15/16  7:41 AM  Result Value Ref Range   Cholesterol 301 (H) 0 - 200 mg/dL   Triglycerides 270 (H) <150 mg/dL   HDL 48 >40 mg/dL   Total CHOL/HDL Ratio 6.3 RATIO   VLDL 54 (H) 0 - 40 mg/dL   LDL Cholesterol 199 (H) 0 - 99 mg/dL    Comment:        Total Cholesterol/HDL:CHD Risk Coronary Heart Disease Risk Table                     Men   Women  1/2 Average Risk   3.4   3.3  Average Risk       5.0   4.4  2 X Average Risk   9.6   7.1  3 X Average Risk  23.4   11.0        Use the calculated Patient Ratio above and the CHD Risk Table to determine the patient's CHD Risk.        ATP III CLASSIFICATION (LDL):  <100     mg/dL   Optimal  100-129  mg/dL   Near or Above                    Optimal  130-159  mg/dL   Borderline  160-189  mg/dL   High  >190     mg/dL   Very High Performed at [facility] [address] [place], [place] [address]   Hemoglobin A1c     Status: None   Collection Time: 12/15/16  7:41 AM  Result Value Ref Range   Hgb A1c MFr Bld 5.6 4.8 - 5.6 %    Comment: (NOTE)         Prediabetes: 5.7 - 6.4         Diabetes: >6.4         Glycemic control for adults with diabetes: <7.0    Mean Plasma Glucose 114 mg/dL    Comment: (NOTE) Performed At: [facility] [place], [place] [id number] Rush Farmer MD KK:[id number] Performed at [facility], [place] [address] [place], [place] [address]   Carbamazepine level, total     Status: None   Collection Time: 12/16/16  6:12 AM  Result Value Ref Range   Carbamazepine Lvl 6.6 4.0 - 12.0 ug/mL    Comment: Performed at [facility] [address], [place], [place] [address]    Blood Alcohol level:  Lab Results  Component Value Date   ETH <10 12/12/2016   ETH  08/03/2009    <5        LOWEST DETECTABLE LIMIT FOR SERUM ALCOHOL IS 5 mg/dL FOR MEDICAL PURPOSES ONLY    Metabolic Disorder Labs: Lab Results  Component Value Date   HGBA1C 5.6 12/15/2016   MPG 114 12/15/2016   No results found for: PROLACTIN Lab Results  Component Value Date   CHOL 301 (H) 12/15/2016   TRIG 270 (H) 12/15/2016   HDL 48 12/15/2016   CHOLHDL 6.3 12/15/2016   VLDL 54 (H) 12/15/2016   LDLCALC 199 (H) 12/15/2016   LDLCALC (H) 05/27/2008    149        Total Cholesterol/HDL:CHD Risk Coronary Heart Disease Risk Table                     Men   Women  1/2 Average Risk   3.4   3.3  Average Risk  5.0   4.4  2 X Average Risk   9.6   7.1  3 X Average Risk  23.4   11.0        Use the calculated Patient Ratio above and the CHD Risk Table to determine the patient's CHD Risk.        ATP III CLASSIFICATION (LDL):  <100     mg/dL   Optimal  100-129  mg/dL   Near or Above                    Optimal  130-159  mg/dL   Borderline  160-189  mg/dL   High  >190     mg/dL   Very High    Physical Findings: AIMS: Facial and Oral Movements Muscles of Facial Expression: None, normal Lips and Perioral Area: None, normal Jaw: None, normal Tongue: None, normal,Extremity Movements Upper (arms, wrists, hands, fingers): None, normal Lower (legs, knees, ankles, toes): Mild, Trunk Movements Neck, shoulders, hips: None, normal, Overall Severity Severity of abnormal movements (highest score from questions above): Minimal Incapacitation due to abnormal movements: None, normal Patient's awareness of abnormal movements (rate  only patient's report): Aware, mild distress, Dental Status Current problems with teeth and/or dentures?: No Does patient usually wear dentures?: No  CIWA:  CIWA-Ar Total: 8 COWS:  COWS Total Score: 4  Musculoskeletal: Strength & Muscle Tone: within normal limits Gait & Station: normal - gait is slow  Patient leans: N/A  Psychiatric Specialty Exam: Physical Exam  ROS denies chest pain, no shortness of breath, no vomiting   Blood pressure 124/66, pulse 85, temperature 99.3 F (37.4 C), temperature source Oral, resp. rate 18, height '5\' 3"'$  (1.6 m), weight 70.3 kg (155 lb), last menstrual period 11/21/2016, SpO2 99 %.Body mass index is 27.46 kg/m.  General Appearance: Fairly Groomed  Eye Contact:  Fair  Speech:  Normal Rate  Volume:  Normal  Mood:  remains depressed, anxious   Affect:  constricted and apprehensive  Thought Process:  Linear, no overtly disorganized thought process noted.   Orientation:  Other:  she is alert, attentive, and is oriented x 3 today  Thought Content:  no hallucinations, not internally preoccupied, reports self referential ideations, such as feeling uncomfortable when hears people laugh as it causes her to wonder what they may be laughing about    Suicidal Thoughts:  Yes.  without intent/plan denies suicidal plan or intention at this time   Homicidal Thoughts:  No  Memory:  recent and remote grossly intact   Judgement:  Fair  Insight:  Fair  Psychomotor Activity:  visible on unit, no overt psychomotor agitation, able to sit through session ,  Concentration:  Concentration: Fair and Attention Span: Fair  Recall:  Good  Fund of Knowledge:  Good  Language:  Fair  Akathisia:  Negative  Handed:  Right  AIMS (if indicated):     Assets:  Desire for Improvement Resilience Social Support  ADL's:  fair  Cognition:  WNL  Sleep:  Number of Hours: 5.75   Assessment - patient remains depressed, significantly anxious, ruminative, crying at times, making  statements such as " what am I going to do", but unable to clarify what she means . Presents with self referential , paranoid ideations, but does not endorse hallucinations, no delusions expressed . Little milieu participation thus far .She is alert, attentive, oriented .  Thus far tolerating medications well, but reports leg movements last night and presents  restless today, which  suggests akathisia. Of note,she has been on Seroquel prior to admission and does not endorse history of akathisia on it in the past . Nursing staff report patient seems to respond best  to Vistaril PRNs, with calming effect  Treatment Plan Summary: Treatment Plan reviewed as below today 12/2  Daily contact with patient to assess and evaluate symptoms and progress in treatment, Medication management, Plan inpatient treatment  and medications as below  Encourage group and milieu participation to work on coping skills and symptom reduction Continue Tegretol 300 mgrs BID for mood disorder, depression Will decrease  Seroquel to 100  mgrs QHS to address potential akathisia, activation Increase Vistaril to 50 mgrs Q 6 hours PRN for anxiety, restlessness  Treatment team working on disposition planning options   Jenne Campus, MD 12/16/2016, 2:38 PM   Patient ID: Danielle Cox, female   DOB: Oct 23, 1960, 56 y.o.   MRN: [id number]

## 2016-12-16 NOTE — [facility] Group Notes (Signed)
[facility] Group Notes: (Clinical Social Work)   12/16/2016      Type of Therapy:  Group Therapy   Participation Level:  Did Not Attend despite MHT prompting   Ercilia Bettinger Grossman-Orr, LCSW 12/16/2016, 12:50 PM     

## 2016-12-16 NOTE — Progress Notes (Signed)
Adult Psychoeducational Group Note  Date:  12/16/2016 Time:  1315  Group Topic/Focus:  Recovery Goals:   The focus of this group is to identify appropriate goals for recovery and establish a plan to achieve them.  Participation Level:  Did Not Attend   Layla BarterWhite, Erum Cercone L 12/16/2016,

## 2016-12-16 NOTE — Progress Notes (Signed)
Pt complains of her legs "twitching."  Reports this has been going on for "months."  On-site provider notified and Requip 0.5 mg POX1 was ordered and administered.  Pt remains on 1:1 for safety.  Will continue to monitor and assess.

## 2016-12-17 DIAGNOSIS — R45851 Suicidal ideations: Secondary | ICD-10-CM

## 2016-12-17 DIAGNOSIS — F39 Unspecified mood [affective] disorder: Secondary | ICD-10-CM

## 2016-12-17 DIAGNOSIS — F332 Major depressive disorder, recurrent severe without psychotic features: Principal | ICD-10-CM

## 2016-12-17 MED ORDER — QUETIAPINE FUMARATE ER 200 MG PO TB24
200.0000 mg | ORAL_TABLET | Freq: Every day | ORAL | Status: DC
  Filled 2016-12-17: qty 1

## 2016-12-17 MED ORDER — LORAZEPAM 2 MG/ML IJ SOLN
INTRAMUSCULAR | Status: AC
  Filled 2016-12-17: qty 1

## 2016-12-17 MED ORDER — QUETIAPINE FUMARATE 25 MG PO TABS
25.0000 mg | ORAL_TABLET | Freq: Two times a day (BID) | ORAL | Status: DC
  Administered 2016-12-17 – 2016-12-19 (×4): 25 mg via ORAL
  Filled 2016-12-17 (×8): qty 1

## 2016-12-17 MED ORDER — LORAZEPAM 2 MG/ML IJ SOLN
1.0000 mg | Freq: Once | INTRAMUSCULAR | Status: AC
  Administered 2016-12-17: 1 mg via INTRAMUSCULAR

## 2016-12-17 MED ORDER — QUETIAPINE FUMARATE 200 MG PO TABS
200.0000 mg | ORAL_TABLET | Freq: Every day | ORAL | Status: DC
  Administered 2016-12-17 – 2016-12-18 (×2): 200 mg via ORAL
  Filled 2016-12-17 (×3): qty 1
  Filled 2016-12-17: qty 2
  Filled 2016-12-17: qty 1

## 2016-12-17 NOTE — Progress Notes (Signed)
1:1 Note 1420  Patient sleeping in bed.  Respirations even and unlabored.  No signs/symptoms of pain/distress noted on patient's face/body movements.  1:1 continues per MD orders.

## 2016-12-17 NOTE — Progress Notes (Signed)
1:1 Note 1800  Patient sitting in dayroom with 1:1.  Patient was given dinner.  Patient stated "I am feeling alittle better.  I don't feel as anxious I did before."  Respirations even and unlabored.  No signs/symptoms of pain/distress noted on patient's face/body movements.  1:1 continues per MD orders.

## 2016-12-17 NOTE — Progress Notes (Signed)
[facility] MD Progress Note  12/17/2016 3:03 PM Danielle Cox  MRN:  [id number]   Subjective:  Patient reports severely depressed and hopeless. She reports that she has had approximately 50 treatments of ECT and was supposed to be set up with TMS treatments. She also reports that she had that nervous feeling well before starting the Seroquel and wants to have that increased again, she feels it helped.  Objective: Patient's chart and findings reviewed and discussed with treatment team. Patient is crying and reporting panic attacks throughout the day. Will increase Seroquel to 200 mg QHS and Seroquel 25 mg BID. She will continue her 1:1 as she still cannot contract for safety.   Principal Problem: Major depressive disorder, recurrent severe without psychotic features (HCC) Diagnosis:   Patient Active Problem List   Diagnosis Date Noted  . Major depressive disorder, recurrent severe without psychotic features (HCC) [F33.2] 12/13/2016  . Overweight (BMI 25.0-29.9) [E66.3] 03/02/2016  . Personality disorder (HCC) [F60.9] 04/23/2013  . Bipolar 1 disorder, depressed ([facility]) [F31.9] 09/29/2012  . Current every day smoker [F17.200] 02/17/2009  . Essential hypertension [I10] 02/17/2009   Total Time spent with patient: 25 minutes  Past Psychiatric History: See H&P  Past Medical History:  Past Medical History:  Diagnosis Date  . Anxiety   . Bipolar disorder (HCC)   . Bronchitis, chronic (HCC)   . Depression   . Personality disorder [facility])     Past Surgical History:  Procedure Laterality Date  . CESAREAN SECTION  1983, 1988, 1992  . TONSILLECTOMY  age 4   Family History:  Family History  Problem Relation Age of Onset  . Depression Mother   . Alcohol abuse Mother   . Depression Father   . Alcohol abuse Father   . Alcohol abuse Sister   . Depression Sister   . Alcohol abuse Brother   . Depression Brother    Family Psychiatric  History: See H&P Social History:  Social History    Substance and Sexual Activity  Alcohol Use No     Social History   Substance and Sexual Activity  Drug Use No    Social History   Socioeconomic History  . Marital status: Married    Spouse name: None  . Number of children: None  . Years of education: None  . Highest education level: None  Social Needs  . Financial resource strain: None  . Food insecurity - worry: None  . Food insecurity - inability: None  . Transportation needs - medical: None  . Transportation needs - non-medical: None  Occupational History  . None  Tobacco Use  . Smoking status: Current Every Day Smoker    Packs/day: 1.00    Types: Cigarettes  . Smokeless tobacco: Never Used  Substance and Sexual Activity  . Alcohol use: No  . Drug use: No  . Sexual activity: Yes    Partners: Male    Birth control/protection: None  Other Topics Concern  . None  Social History Narrative   10/29/2012 [name] [name] was born and grew up in [place], [place]. She has 2 brothers and one sister. She reports that her childhood was "poor," and explains that she needs both financially and emotionally. She completed the 10th grade, then achieved her GED. She has been married twice. The first time at age 54, and that ended after 1 year. She is currently married to her second husband of 27 years. She has 2 sons and one daughter. She is currently unemployed and  on disability for 2 years. She lives with her husband, her daughter, and one of her daughter's friends. She denies any legal difficulties. She reports that she is spiritual but not religious. Her hobbies include reading and cross stitch. She reports that she has no social support network. 10/29/2012 [name]   Additional Social History:                         Sleep: Fair  Appetite:  Poor  Current Medications: Current Facility-Administered Medications  Medication Dose Route Frequency Provider Last Rate Last Dose  . alum & mag hydroxide-simeth (MAALOX/MYLANTA) 200-200-20  MG/5ML suspension 30 mL  30 mL Oral Q4H PRN Charm Rings, NP      . carbamazepine (TEGRETOL XR) 12 hr tablet 300 mg  300 mg Oral BID Charm Rings, NP   300 mg at 12/17/16 0805  . cholecalciferol (VITAMIN D) tablet 1,000 Units  1,000 Units Oral Daily Charm Rings, NP   1,000 Units at 12/17/16 0805  . hydrOXYzine (ATARAX/VISTARIL) tablet 50 mg  50 mg Oral Q6H PRN Marget Outten, Rockey Situ, MD   50 mg at 12/17/16 0843  . LORazepam (ATIVAN) 2 MG/ML injection           . LORazepam (ATIVAN) tablet 1 mg  1 mg Oral Q6H PRN Abdalla Naramore, Rockey Situ, MD   1 mg at 12/17/16 1251  . magnesium hydroxide (MILK OF MAGNESIA) suspension 30 mL  30 mL Oral Daily PRN Charm Rings, NP   30 mL at 12/17/16 0810  . multivitamin with minerals tablet 1 tablet  1 tablet Oral Daily Meilyn Heindl, Rockey Situ, MD   1 tablet at 12/17/16 0806  . nicotine (NICODERM CQ - dosed in mg/24 hours) patch 21 mg  21 mg Transdermal Daily Zakariyya Helfman, Rockey Situ, MD   21 mg at 12/17/16 0806  . ondansetron (ZOFRAN-ODT) disintegrating tablet 4 mg  4 mg Oral Q6H PRN Vint Pola, Rockey Situ, MD   4 mg at 12/15/16 1538  . QUEtiapine (SEROQUEL XR) 24 hr tablet 200 mg  200 mg Oral QHS Money, Travis B, FNP      . rOPINIRole (REQUIP) tablet 0.5 mg  0.5 mg Oral QHS Dyna Figuereo, Rockey Situ, MD   0.5 mg at 12/16/16 2114  . thiamine (VITAMIN B-1) tablet 100 mg  100 mg Oral Daily Ayushi Pla, Rockey Situ, MD   100 mg at 12/17/16 1610    Lab Results:  Results for orders placed or performed during the hospital encounter of 12/13/16 (from the past 48 hour(s))  Carbamazepine level, total     Status: None   Collection Time: 12/16/16  6:12 AM  Result Value Ref Range   Carbamazepine Lvl 6.6 4.0 - 12.0 ug/mL    Comment: Performed at [facility], [address], [place], [place] [address]    Blood Alcohol level:  Lab Results  Component Value Date   ETH <10 12/12/2016   ETH  08/03/2009    <5        LOWEST DETECTABLE LIMIT FOR SERUM ALCOHOL IS 5 mg/dL FOR MEDICAL PURPOSES ONLY     Metabolic Disorder Labs: Lab Results  Component Value Date   HGBA1C 5.6 12/15/2016   MPG 114 12/15/2016   No results found for: PROLACTIN Lab Results  Component Value Date   CHOL 301 (H) 12/15/2016   TRIG 270 (H) 12/15/2016   HDL 48 12/15/2016   CHOLHDL 6.3 12/15/2016   VLDL 54 (H) 12/15/2016  LDLCALC 199 (H) 12/15/2016   LDLCALC (H) 05/27/2008    149        Total Cholesterol/HDL:CHD Risk Coronary Heart Disease Risk Table                     Men   Women  1/2 Average Risk   3.4   3.3  Average Risk       5.0   4.4  2 X Average Risk   9.6   7.1  3 X Average Risk  23.4   11.0        Use the calculated Patient Ratio above and the CHD Risk Table to determine the patient's CHD Risk.        ATP III CLASSIFICATION (LDL):  <100     mg/dL   Optimal  098-119100-129  mg/dL   Near or Above                    Optimal  130-159  mg/dL   Borderline  147-829160-189  mg/dL   High  >562>190     mg/dL   Very High    Physical Findings: AIMS: Facial and Oral Movements Muscles of Facial Expression: None, normal Lips and Perioral Area: None, normal Jaw: None, normal Tongue: None, normal,Extremity Movements Upper (arms, wrists, hands, fingers): None, normal Lower (legs, knees, ankles, toes): Mild, Trunk Movements Neck, shoulders, hips: None, normal, Overall Severity Severity of abnormal movements (highest score from questions above): Minimal Incapacitation due to abnormal movements: None, normal Patient's awareness of abnormal movements (rate only patient's report): Aware, mild distress, Dental Status Current problems with teeth and/or dentures?: No Does patient usually wear dentures?: No  CIWA:  CIWA-Ar Total: 5 COWS:  COWS Total Score: 4  Musculoskeletal: Strength & Muscle Tone: within normal limits Gait & Station: normal Patient leans: N/A  Psychiatric Specialty Exam: Physical Exam  ROS  Blood pressure (!) 82/64, pulse (!) 55, temperature 98.3 F (36.8 C), temperature source Oral, resp.  rate 18, height 5\' 3"  (1.6 m), weight 70.3 kg (155 lb), last menstrual period 11/21/2016, SpO2 99 %.Body mass index is 27.46 kg/m.  General Appearance: Disheveled  Eye Contact:  Fair  Speech:  Clear and Coherent and Normal Rate  Volume:  Decreased  Mood:  Depressed and Dysphoric  Affect:  Depressed and Flat  Thought Process:  Goal Directed and Descriptions of Associations: Intact  Orientation:  Full (Time, Place, and Person)  Thought Content:  WDL  Suicidal Thoughts:  Yes.  without intent/plan  Homicidal Thoughts:  No  Memory:  Immediate;   Good Recent;   Good Remote;   Good  Judgement:  Good  Insight:  Good  Psychomotor Activity:  Normal  Concentration:  Concentration: Good and Attention Span: Good  Recall:  Good  Fund of Knowledge:  Good  Language:  Good  Akathisia:  No  Handed:  Right  AIMS (if indicated):     Assets:  Communication Skills Desire for Improvement Financial Resources/Insurance Housing Physical Health Social Support Transportation  ADL's:  Intact  Cognition:  WNL  Sleep:  Number of Hours: 5.5   Problem Addressed: MDD severe  Treatment Plan Summary: Daily contact with patient to assess and evaluate symptoms and progress in treatment, Medication management and Plan is to:  -Start Seroquel 200 mg PO QHS for mood stability -Discontinue Seroquel XR -Start Seroquel 25 mg PO BID for mood stability -Continue Ativan 1 mg PO Q6H PRN for anxiety or CIWA .10 -Continue  Tegretol 300 mg PO BID -Continue Vistaril 50 mg PO Q6H PRN for anxiety -Encourage group therapy participation   Maryfrances Bunnellravis B Money, FNP 12/17/2016, 3:03 PM   Agree with NP Progress Note

## 2016-12-17 NOTE — Progress Notes (Signed)
1:1 Note 1855  Patient's husband visited her tonight.  Patient denied SI and HI.  Denied A/V hallucinations.  Patient stated she has been taking flonase several years and needs flonase tonight.  Nurse will report to next shift.  Patient stated her anxiety has improved alittle.  Patient stated she ate 50% of her dinner.  Respirations even and unlabored.  No signs/symptoms of pain/distress noted on patient's face/body movements.  1:1 continues for safety per MD order.

## 2016-12-17 NOTE — Progress Notes (Signed)
1:1 Note: 0800   Patient sitting in bed, eating breakfast, sad, depressed.  Patient denied SI and HI.   Denied A/V hallucinations.  Denied pain.  Respirations even and unlabored.  No signs/symptoms of pain/distress noted on patient's face/body movements.   1:1 continues for patient's safety per MD order.

## 2016-12-17 NOTE — Progress Notes (Signed)
1:1 Note 1125  Patient sitting on side of bed eating lunch.  Respirations even and unlabored.  No signs/symptoms of pain/distress noted on patient's face/body movements.  1:1 continues per MD order for safety.

## 2016-12-17 NOTE — Progress Notes (Signed)
D: Pt is resting in bed with eyes closed.  Respirations are even and unlabored.  No distress noted.  A: Pt remains on 1:1 for safety.  R: Pt is safe on the unit.  Will continue to monitor and assess.  

## 2016-12-17 NOTE — Progress Notes (Signed)
1:1 Note 1255  Patient walked down hallway with 1:1 present.  Patient crying, stated "I'm having a panic attack, I'm having a panic attack."  Ativan given to patient.  MD talking with patient at this time.  1:1 continues per MD order for safety.

## 2016-12-17 NOTE — Progress Notes (Signed)
1:1 Note 1345  Patient upset, crying, vomited in her room.  Patient stated she is very worried, upset about her life.  NP ordered ativan 1 mg IM to be given patient.  1:1 continues for patient's safety per MD order.

## 2016-12-17 NOTE — Progress Notes (Signed)
D: Pt is resting in bed with eyes closed.  Respirations are even and unlabored.  No distress noted.  A: Pt remains on 1:1 for safety.  R: Pt is safe on the unit.  Will continue to monitor and assess.

## 2016-12-17 NOTE — Evaluation (Signed)
Physical Therapy Evaluation-1x Patient Details Name: Monica MartinezSheila K Pilkenton MRN: [id number] DOB: [birth year] Today's Date: 12/17/2016   History of Present Illness  56 yo female [facility] with [facility]. Hx of chronic back and neck pain, fibromyalgia.  Clinical Impression  On eval, pt was supervision level for mobility. She walked ~150 feet without an assistive device. No overt LOB. Pt was mildly unsteady once or twice. Do not anticipate any f/u PT needs at this time. Encouraged pt to mobilize more (walk to groups, dining room) especially since she has 1:1 supervision currently. 1x eval. Will sign off.     Follow Up Recommendations No PT follow up;Supervision for mobility/OOB    Equipment Recommendations  None recommended by PT    Recommendations for Other Services       Precautions / Restrictions Precautions Precautions: Fall Restrictions Weight Bearing Restrictions: No      Mobility  Bed Mobility               General bed mobility comments: pt sitting EOB  Transfers Overall transfer level: Independent                  Ambulation/Gait Ambulation/Gait assistance: Supervision Ambulation Distance (Feet): 150 Feet Assistive device: None Gait Pattern/deviations: WFL(Within Functional Limits)     General Gait Details: close supervision for safety. No overt LOB. Pt was mildy unsteady at times. Slow gait speed. She tolerated distance well.   Stairs            Wheelchair Mobility    Modified Rankin (Stroke Patients Only)       Balance Overall balance assessment: No apparent balance deficits (not formally assessed)                                           Pertinent Vitals/Pain Pain Assessment: 0-10 Pain Location: back and neck Pain Descriptors / Indicators: Aching;Sore Pain Intervention(s): Monitored during session    Home Living Family/patient expects to be discharged to:: Private residence Living Arrangements: Spouse/significant  other Available Help at Discharge: Family Type of Home: House Home Access: Stairs to enter Entrance Stairs-Rails: Right Entrance Stairs-Number of Steps: 3 Home Layout: One level Home Equipment: None      Prior Function Level of Independence: Independent               Hand Dominance        Extremity/Trunk Assessment   Upper Extremity Assessment Upper Extremity Assessment: Overall WFL for tasks assessed    Lower Extremity Assessment Lower Extremity Assessment: Overall WFL for tasks assessed    Cervical / Trunk Assessment Cervical / Trunk Assessment: Normal  Communication   Communication: No difficulties  Cognition Arousal/Alertness: Awake/alert   Overall Cognitive Status: Within Functional Limits for tasks assessed                                 General Comments: Pt was tearful throughout entire session. Pt stated she was depressed.       General Comments      Exercises     Assessment/Plan    PT Assessment Patent does not need any further PT services  PT Problem List         PT Treatment Interventions      PT Goals (Current goals can be found in the Care Plan  section)  Acute Rehab PT Goals Patient Stated Goal: to feel better PT Goal Formulation: All assessment and education complete, DC therapy    Frequency     Barriers to discharge        Co-evaluation               AM-PAC PT "6 Clicks" Daily Activity  Outcome Measure Difficulty turning over in bed (including adjusting bedclothes, sheets and blankets)?: None Difficulty moving from lying on back to sitting on the side of the bed? : None Difficulty sitting down on and standing up from a chair with arms (e.g., wheelchair, bedside commode, etc,.)?: None Help needed moving to and from a bed to chair (including a wheelchair)?: None Help needed walking in hospital room?: A Little Help needed climbing 3-5 steps with a railing? : A Little 6 Click Score: 22    End of  Session Equipment Utilized During Treatment: Gait belt Activity Tolerance: Patient tolerated treatment well Patient left: in bed;with call bell/phone within reach;with nursing/sitter in room   PT Visit Diagnosis: Difficulty in walking, not elsewhere classified (R26.2)    Time: [id number] PT Time Calculation (min) (ACUTE ONLY): 10 min   Charges:   PT Evaluation $PT Eval Low Complexity: 1 Low     PT G Codes:   PT G-Codes **NOT FOR INPATIENT CLASS** Functional Assessment Tool Used: AM-PAC 6 Clicks Basic Mobility;Clinical judgement Functional Limitation: Mobility: Walking and moving around Mobility: Walking and Moving Around Current Status ([phone](G8978): At least 1 percent but less than 20 percent impaired, limited or restricted Mobility: Walking and Moving Around Goal Status [phone](G8979): At least 1 percent but less than 20 percent impaired, limited or restricted Mobility: Walking and Moving Around Discharge Status [phone]): At least 1 percent but less than 20 percent impaired, limited or restricted      Rebeca AlertJannie Jesenya Bowditch, MPT Pager: [phone]

## 2016-12-17 NOTE — Progress Notes (Signed)
1:1 Note 1130  NP talked to patient this morning.  Patient tearful, depressed.  Patient stated her medications are not working for her.   Respirations even and unlabored.  1:1 continues per MD order for safety.

## 2016-12-17 NOTE — [facility] Suicide Risk Assessment (Signed)
[facility] INPATIENT:  Family/Significant Other Suicide Prevention Education  Suicide Prevention Education:  Education Completed; Danielle Cox (Danielle Cox) [phone] has been identified by the patient as the family member/significant other with whom the patient will be residing, and identified as the person(s) who will aid the patient in the event of a mental health crisis (suicidal ideations/suicide attempt).  With written consent from the patient, the family member/significant other has been provided the following suicide prevention education, prior to the and/or following the discharge of the patient.  The suicide prevention education provided includes the following:  Suicide risk factors  Suicide prevention and interventions  National Suicide Hotline telephone number  [facility] assessment telephone number  [facility] Emergency Assistance 911  [facility] and/or Residential Mobile Crisis Unit telephone number  Request made of family/significant other to:  Remove weapons (e.g., guns, rifles, knives), all items previously/currently identified as safety concern.    Remove drugs/medications (over-the-counter, prescriptions, illicit drugs), all items previously/currently identified as a safety concern.  The family member/significant other verbalizes understanding of the suicide prevention education information provided.  The family member/significant other agrees to remove the items of safety concern listed above.  Danielle Cox states that "my wife is doing horrible. She was even in a wheelchair last night." He is concerned that pt will return home "and not be any better." Danielle Cox reports that pt has struggled with severe depression for 25 years but it has gotten progressively worse over the past 6-8 months. Danielle Cox works full time and is unable to stay home to make sure she eats and cares for herself. "If I don't cook, she won't feed herself, she locks herself  in the house and refuses to leave, she refuses to go to support groups." Danielle Cox shared that pt typically is put on medication and does fair for a few months, but begins to decompensate when the medication becomes less effective. The psychiatrist will then change her medications and the the cycle continues. Pt has reported SI for years but has never attempted to harm herself until prior to this admission. He would like her to be placed in a long term mental health facility--CSW informed him that there is not a facility in [place] that she would meet residential criteria for at this point. He asked that MD/treatment team explore all options. Per Reola Calkinsravis Money NP, ECT may be an option for her. CSW continuing to assess.  Candise Crabtree N Smart LCSW 12/17/2016, 11:10 AM

## 2016-12-17 NOTE — Progress Notes (Signed)
Recreation Therapy Notes  Date: 12/17/16 Time: 0930 Location: 300 Hall Dayroom  Group Topic: Stress Management  Goal Area(s) Addresses:  Patient will verbalize importance of using healthy stress management.  Patient will identify positive emotions associated with healthy stress management.   Intervention: Stress Management  Activity :  Meditation.  LRT introduced the stress management technique of meditation.  LRT played a body scan meditation from the Calm app that allowed patients to take inventory of the tension or sensations they were feeling.  Education:  Stress Management, Discharge Planning.   Education Outcome: Acknowledges edcuation/In group clarification offered/Needs additional education  Clinical Observations/Feedback: Pt did not attend group.   Danielle Cox, LRT/CTRS         Danielle Cox A 12/17/2016 12:45 PM 

## 2016-12-17 NOTE — Progress Notes (Signed)
1:1 Note 0845  Patient came to medication window, tearful, sad, depressed.  Vistaril given to patient for anxiety.  Respirations even and unlabored.  Safety maintained with 1:1 per MD order.

## 2016-12-17 NOTE — Plan of Care (Signed)
Nurse discussed depression/anxiety/coping skills with patient. 

## 2016-12-17 NOTE — Progress Notes (Signed)
1:1 Note 1700  Patient out of bed, walking hallway.  Patient sat in dayroom with 1:1.  Respirations even and unlabored.  No signs/symptoms of pain/distress noted on patient's face/body movements.  1:1 continues for safety per MD orders.

## 2016-12-18 MED ORDER — IBUPROFEN 600 MG PO TABS
600.0000 mg | ORAL_TABLET | Freq: Four times a day (QID) | ORAL | Status: DC | PRN
  Administered 2016-12-18 – 2016-12-27 (×9): 600 mg via ORAL
  Filled 2016-12-18 (×9): qty 1

## 2016-12-18 MED ORDER — LORAZEPAM 1 MG PO TABS
1.0000 mg | ORAL_TABLET | Freq: Once | ORAL | Status: AC
  Administered 2016-12-18: 1 mg via ORAL
  Filled 2016-12-18: qty 1

## 2016-12-18 MED ORDER — FLUTICASONE PROPIONATE 50 MCG/ACT NA SUSP
2.0000 | Freq: Two times a day (BID) | NASAL | Status: DC | PRN
  Administered 2016-12-19 – 2016-12-28 (×6): 2 via NASAL
  Filled 2016-12-18 (×2): qty 16

## 2016-12-18 MED ORDER — DULOXETINE HCL 30 MG PO CPEP
30.0000 mg | ORAL_CAPSULE | Freq: Every day | ORAL | Status: DC
  Administered 2016-12-18 – 2016-12-19 (×2): 30 mg via ORAL
  Filled 2016-12-18 (×4): qty 1

## 2016-12-18 NOTE — Progress Notes (Addendum)
Patient ID: Danielle MartinezSheila K Mcpartlin, female   DOB: 1961/01/07, 56 y.o.   MRN: [id number]  1:1 Note Pt is very flat and withdrawn to self; remained in bed for most of the evening. Pt at the time of endorsed severe depression, helplessness, hopelessness and was very tearful. Pt also complained of moderate anxiety. Pt denied pain, SI, HI of AVH. Pt does not look to be in any acute distress at this time. 1:1 staff is present in room with Pt at this time. 1:1 monitoring continues for Pt's safety. 15-minute safety checks also continues at this time.

## 2016-12-18 NOTE — Progress Notes (Signed)
Patient ID: Danielle MartinezSheila K Shippee, female   DOB: 10-30-1960, 56 y.o.   MRN: [id number]  Patient irritable during visitation with her son, uttering: "I am hot! I am hot!", and taking off his clothes.  Pt was given Atarax 50mg . For anxiety, and assisted to her room and encouraged to lie down.  Staff placed temporarily with patient, and Consulting civil engineerCharge RN notified.  Will report to oncoming shift.

## 2016-12-18 NOTE — Progress Notes (Addendum)
Patient ID: Danielle MartinezSheila K Cox, female   DOB: [birth year], 56 y.o.   MRN: [id number] Care of pt assumed at 1100, pt with blunted affect, complained of anxiety, was medicated with Atarax 50mg  for anxiety.  Pt also complained of moderate abdominal pain, NP notified for order for Motrin, order placed, but pt is currently sleeping.  Will reassess need for medication and given to pt was ordered.  Pt denies SI/HI/AVH currently.  Pt was on 1:1 monitoring, order discontinued, and pt being monitored on close observations, within staff's at all times.  14:30: Patient with labile mood, crying, stating: "No one cares! I don't care!".  Pt elaborated that she feels lonely all the time at home.  Empathy provided, pt encouraged to speak with case management for outpatient programs, but stated: "I don't like being around people!".  When asked if she was feeling like hurting herself, pt stated: "There is nothing here for me to hurt myself with".  Close observations maintained, pt asked for, and was given Ibuprofen 600mg  for complaints of abdominal pain of 7/10.  Will continue to monitor.

## 2016-12-18 NOTE — [facility] Group Notes (Signed)
Adult Psychoeducational Group Note  Date:  12/18/2016 Time:  4:44 AM  Group Topic/Focus:  Wrap-Up Group:   The focus of this group is to help patients review their daily goal of treatment and discuss progress on daily workbooks.  Participation Level:  Minimal  Participation Quality:  Inattentive  Affect:  Depressed, Flat and Tearful  Cognitive:  Lacking  Insight: Limited  Engagement in Group:  Lacking and Poor  Modes of Intervention:  Discussion and Education  Additional Comments:  Pt attended group. When asked about how pt day was, pt was tearful and stated they had a bad day.    Danielle NettersOctavia A Mehtab Dolberry 12/18/2016, 4:44 AM

## 2016-12-18 NOTE — Progress Notes (Signed)
[facility] MD Progress Note  12/18/2016 2:31 PM Danielle Cox  MRN:  [id number]   Subjective:  Patient reports that she is still very depressed, but denies any SI/AHI/AVH and will contract for safety. She reports having some abdominal pain and she states "It's my period. It happened the last time I came to the hospital in May." She agrees to start Cymbalta 30 mg Daily.   Objective: Patient's chart and findings reviewed and discussed with treatment team. Patient is tearful at times, but stops crying immediately when she chooses. With discussion she has only had 2 menstrual cycles this year which was May and currently. Suspect that patient's hormones have a large effect on her mood. Will start her on Cymbalta 30 mg Daily. Also started Ibuprofen 600 mg Q6H PRN. Will also move her to close observation instead of 1:1.  Principal Problem: Major depressive disorder, recurrent severe without psychotic features (HCC) Diagnosis:   Patient Active Problem List   Diagnosis Date Noted  . Major depressive disorder, recurrent severe without psychotic features (HCC) [F33.2] 12/13/2016  . Overweight (BMI 25.0-29.9) [E66.3] 03/02/2016  . Personality disorder (HCC) [F60.9] 04/23/2013  . Bipolar 1 disorder, depressed (HCC) [F31.9] 09/29/2012  . Current every day smoker [F17.200] 02/17/2009  . Essential hypertension [I10] 02/17/2009   Total Time spent with patient: 25 minutes  Past Psychiatric History: See H&P  Past Medical History:  Past Medical History:  Diagnosis Date  . Anxiety   . Bipolar disorder (HCC)   . Bronchitis, chronic (HCC)   . Depression   . Personality disorder [facility])     Past Surgical History:  Procedure Laterality Date  . CESAREAN SECTION  1983, 1988, 1992  . TONSILLECTOMY  age 15   Family History:  Family History  Problem Relation Age of Onset  . Depression Mother   . Alcohol abuse Mother   . Depression Father   . Alcohol abuse Father   . Alcohol abuse Sister   . Depression Sister    . Alcohol abuse Brother   . Depression Brother    Family Psychiatric  History: See H&P Social History:  Social History   Substance and Sexual Activity  Alcohol Use No     Social History   Substance and Sexual Activity  Drug Use No    Social History   Socioeconomic History  . Marital status: Married    Spouse name: None  . Number of children: None  . Years of education: None  . Highest education level: None  Social Needs  . Financial resource strain: None  . Food insecurity - worry: None  . Food insecurity - inability: None  . Transportation needs - medical: None  . Transportation needs - non-medical: None  Occupational History  . None  Tobacco Use  . Smoking status: Current Every Day Smoker    Packs/day: 1.00    Types: Cigarettes  . Smokeless tobacco: Never Used  Substance and Sexual Activity  . Alcohol use: No  . Drug use: No  . Sexual activity: Yes    Partners: Male    Birth control/protection: None  Other Topics Concern  . None  Social History Narrative   10/29/2012 Danielle Cox was born and grew up in [place], [place]. She has 2 brothers and one sister. She reports that her childhood was "poor," and explains that she needs both financially and emotionally. She completed the 10th grade, then achieved her GED. She has been married twice. The first time at age 55, and that  ended after 1 year. She is currently married to her second husband of 27 years. She has 2 sons and one daughter. She is currently unemployed and on disability for 2 years. She lives with her husband, her daughter, and one of her daughter's friends. She denies any legal difficulties. She reports that she is spiritual but not religious. Her hobbies include reading and cross stitch. She reports that she has no social support network. 10/29/2012 Danielle   Additional Social History:                         Sleep: Good  Appetite:  Good  Current Medications: Current Facility-Administered  Medications  Medication Dose Route Frequency Provider Last Rate Last Dose  . alum & mag hydroxide-simeth (MAALOX/MYLANTA) 200-200-20 MG/5ML suspension 30 mL  30 mL Oral Q4H PRN Charm RingsLord, Jamison Y, NP      . carbamazepine (TEGRETOL XR) 12 hr tablet 300 mg  300 mg Oral BID Charm RingsLord, Jamison Y, NP   300 mg at 12/18/16 [id number]  . cholecalciferol (VITAMIN D) tablet 1,000 Units  1,000 Units Oral Daily Charm RingsLord, Jamison Y, NP   1,000 Units at 12/18/16 [phone]  . DULoxetine (CYMBALTA) DR capsule 30 mg  30 mg Oral Daily Merisa Julio, [name], FNP      . hydrOXYzine (ATARAX/VISTARIL) tablet 50 mg  50 mg Oral Q6H PRN Cobos, Rockey SituFernando A, MD   50 mg at 12/18/16 1205  . ibuprofen (ADVIL,MOTRIN) tablet 600 mg  600 mg Oral Q6H PRN Polette Nofsinger, Feliz Beamravis B, FNP      . magnesium hydroxide (MILK OF MAGNESIA) suspension 30 mL  30 mL Oral Daily PRN Charm RingsLord, Jamison Y, NP   30 mL at 12/17/16 0810  . multivitamin with minerals tablet 1 tablet  1 tablet Oral Daily Cobos, Rockey SituFernando A, MD   1 tablet at 12/18/16 0941  . nicotine (NICODERM CQ - dosed in mg/24 hours) patch 21 mg  21 mg Transdermal Daily Cobos, Rockey SituFernando A, MD   21 mg at 12/18/16 0940  . QUEtiapine (SEROQUEL) tablet 200 mg  200 mg Oral QHS Adamarie Izzo, [name], FNP   200 mg at 12/17/16 2134  . QUEtiapine (SEROQUEL) tablet 25 mg  25 mg Oral BID Kamaiya Antilla, [name], FNP   25 mg at 12/18/16 0941  . rOPINIRole (REQUIP) tablet 0.5 mg  0.5 mg Oral QHS Cobos, Rockey SituFernando A, MD   0.5 mg at 12/17/16 2131  . thiamine (VITAMIN B-1) tablet 100 mg  100 mg Oral Daily Cobos, Rockey SituFernando A, MD   100 mg at 12/18/16 [id number]    Lab Results: No results found for this or any previous visit (from the past 48 hour(s)).  Blood Alcohol level:  Lab Results  Component Value Date   ETH <10 12/12/2016   ETH  08/03/2009    <5        LOWEST DETECTABLE LIMIT FOR SERUM ALCOHOL IS 5 mg/dL FOR MEDICAL PURPOSES ONLY    Metabolic Disorder Labs: Lab Results  Component Value Date   HGBA1C 5.6 12/15/2016   MPG 114 12/15/2016   No results  found for: PROLACTIN Lab Results  Component Value Date   CHOL 301 (H) 12/15/2016   TRIG 270 (H) 12/15/2016   HDL 48 12/15/2016   CHOLHDL 6.3 12/15/2016   VLDL 54 (H) 12/15/2016   LDLCALC 199 (H) 12/15/2016   LDLCALC (H) 05/27/2008    149        Total Cholesterol/HDL:CHD Risk Coronary Heart Disease  Risk Table                     Men   Women  1/2 Average Risk   3.4   3.3  Average Risk       5.0   4.4  2 X Average Risk   9.6   7.1  3 X Average Risk  23.4   11.0        Use the calculated Patient Ratio above and the CHD Risk Table to determine the patient's CHD Risk.        ATP III CLASSIFICATION (LDL):  <100     mg/dL   Optimal  [id number]  mg/dL   Near or Above                    Optimal  130-159  mg/dL   Borderline  [phone]  mg/dL   High  >562>190     mg/dL   Very High    Physical Findings: AIMS: Facial and Oral Movements Muscles of Facial Expression: None, normal Lips and Perioral Area: None, normal Jaw: None, normal Tongue: None, normal,Extremity Movements Upper (arms, wrists, hands, fingers): None, normal Lower (legs, knees, ankles, toes): None, normal, Trunk Movements Neck, shoulders, hips: None, normal, Overall Severity Severity of abnormal movements (highest score from questions above): Minimal Incapacitation due to abnormal movements: None, normal Patient's awareness of abnormal movements (rate only patient's report): No Awareness, Dental Status Current problems with teeth and/or dentures?: No Does patient usually wear dentures?: No  CIWA:  CIWA-Ar Total: 4 COWS:  COWS Total Score: 2  Musculoskeletal: Strength & Muscle Tone: within normal limits Gait & Station: normal Patient leans: N/A  Psychiatric Specialty Exam: Physical Exam  ROS  Blood pressure 100/63, pulse 93, temperature 98.2 F (36.8 C), temperature source Oral, resp. rate 18, height 5\' 3"  (1.6 m), weight 70.3 kg (155 lb), last menstrual period 11/21/2016, SpO2 99 %.Body mass index is 27.46 kg/m.   General Appearance: Disheveled  Eye Contact:  Fair  Speech:  Clear and Coherent and Normal Rate  Volume:  Normal  Mood:  Dysphoric  Affect:  Depressed, Flat and Tearful  Thought Process:  Goal Directed and Descriptions of Associations: Intact  Orientation:  Full (Time, Place, and Person)  Thought Content:  WDL  Suicidal Thoughts:  No  Homicidal Thoughts:  No  Memory:  Immediate;   Good Recent;   Good Remote;   Good  Judgement:  Fair  Insight:  Fair  Psychomotor Activity:  Normal  Concentration:  Concentration: Good and Attention Span: Good  Recall:  Good  Fund of Knowledge:  Good  Language:  Good  Akathisia:  No  Handed:  Right  AIMS (if indicated):     Assets:  Communication Skills Desire for Improvement Financial Resources/Insurance Housing Social Support Transportation  ADL's:  Intact  Cognition:  WNL  Sleep:  Number of Hours: 6   Problems Addressed: MDD severe  Treatment Plan Summary: Daily contact with patient to assess and evaluate symptoms and progress in treatment, Medication management and Plan is to:  -Start Cymbalta 30 mg PO Daily for mood stability -Continue Seroquel 200 mg PO QHS for mood stability -Continue Seroquel 25 mg PO BID for mood stability -Continue Tegretol 300 mg PO BID  -Encourage group therapy participation -Place patient on close observation   Danielle Bunnellravis B Jessye Imhoff, FNP 12/18/2016, 2:31 PM

## 2016-12-18 NOTE — Progress Notes (Signed)
Patient ID: Danielle MartinezSheila K Daggs, female   DOB: [birth year], 56 y.o.   MRN: [id number]  Pt at this time is in bed resting with eyes closed. Pt does not look to be in any distress at this time. 1:1 staff is present in room with Pt at this time. 1:1 monitoring continues for Pt's safety. 15-minute safety checks also continues at this time.

## 2016-12-18 NOTE — Progress Notes (Signed)
1:1 Progress Note 1000  Patient in bed since first check with eyes closed, even non-labored chest rises. Awake at one point to take medicines.  Denies SI, HI, AVH.

## 2016-12-18 NOTE — Progress Notes (Signed)
Recreation Therapy Notes  Animal-Assisted Activity (AAA) Program Checklist/Progress Notes Patient Eligibility Criteria Checklist & Daily Group note for Rec TxIntervention  Date: 12.04.2018 Time: 2:45pm Location: [address]   AAA/T Program Assumption of Risk Form signed by Patient/ or Parent Legal Guardian Yes  Patient is free of allergies or sever asthma Yes  Patient reports no fear of animals Yes  Patient reports no history of cruelty to animals Yes  Patient understands his/her participation is voluntary Yes  Patient washes hands before animal contact Yes  Patient washes hands after animal contact Yes  Behavioral Response: Appropriate   Education:Hand Washing, Appropriate Animal Interaction   Education Outcome: Acknowledges education.   Clinical Observations/Feedback: Patient attended session and interacted appropriately with therapy dog and peers.  Derelle Cockrell L Jahnai Slingerland, LRT/CTRS        Nial Hawe L 12/18/2016 3:00 PM 

## 2016-12-18 NOTE — Progress Notes (Signed)
Pt placed back on the 1:1 for safety since she almost fell when she had visitors and her son caught her   Her husband informed staff of same and that he doesn't want her to fall    He said it at least 3 times and it was taken as a warning and that she needs to have someone with her

## 2016-12-18 NOTE — Progress Notes (Signed)
Adult Psychoeducational Group Note  Date:  12/18/2016 Time:  11:51 PM  Group Topic/Focus:   Wrap-Up Group:   The focus of this group is to help patients review their daily goal of treatment and discuss progress on daily workbooks.  Participation Level:  None  Participation Quality:  Inattentive  Affect:  Depressed and Tearful  Cognitive:  Lacking  Insight: None  Engagement in Group:  Lacking  Modes of Intervention:  Discussion  Additional Comments:  Patient attended group, but was not able to share due to crying. Patient was later a 1:1.   Lyndee HensenGoins, Tenika Keeran R 12/18/2016, 11:51 PM

## 2016-12-19 DIAGNOSIS — I10 Essential (primary) hypertension: Secondary | ICD-10-CM

## 2016-12-19 MED ORDER — QUETIAPINE FUMARATE 300 MG PO TABS
300.0000 mg | ORAL_TABLET | Freq: Every day | ORAL | Status: DC
  Administered 2016-12-19 – 2016-12-20 (×2): 300 mg via ORAL
  Filled 2016-12-19 (×4): qty 1

## 2016-12-19 MED ORDER — LORAZEPAM 1 MG PO TABS
ORAL_TABLET | ORAL | Status: AC
Start: 2016-12-19 — End: 2016-12-19
  Administered 2016-12-19: 04:00:00
  Filled 2016-12-19: qty 1

## 2016-12-19 MED ORDER — GABAPENTIN 400 MG PO CAPS
400.0000 mg | ORAL_CAPSULE | Freq: Every day | ORAL | Status: DC
  Administered 2016-12-19 – 2016-12-24 (×6): 400 mg via ORAL
  Filled 2016-12-19 (×7): qty 1

## 2016-12-19 MED ORDER — LORAZEPAM 1 MG PO TABS: 1.0000 mg | ORAL_TABLET | Freq: Once | ORAL | Status: AC

## 2016-12-19 NOTE — Progress Notes (Signed)
Patient continues post 1:1 staff observation. Patient remains safe on the unit, with no injuries observed or reported. Will continue to observe and maintain q15 min safety checks.

## 2016-12-19 NOTE — Progress Notes (Signed)
Pt awake off and on during the night   She was up and down in her bed   Very restless and fidgety     She requested more medication for her nerves so she could relax   Pt received Ativan 1mg     Pt is on a 1:1 for safety    Safety maintained

## 2016-12-19 NOTE — Progress Notes (Signed)
Data. Patient denies SI/HI/AVH. Verbally contracts for safety on the unit and to come to staff before acting of any self harm thoughts/feelings.  Patient's 1:1 staff observation for safety related to fall risk, has been discontinued. Patient has been given a standard walker to use with any ambulation and also been educated to the call light system. Action. Emotional support and encouragement offered. Education provided on medication, indications and side effect. Q 15 minute checks done for safety. Response. Patient able to verbally report understanding of walker and call light education.Safety on the unit maintained through 15 minute checks.  Medications taken as prescribed. Remained calm and appropriate.

## 2016-12-19 NOTE — Progress Notes (Signed)
[facility]  12/19/2016 2:24 PM Danielle MartinezSheila K Cox  MRN:  [id number] Subjective:   56 y.o Caucasian female, married, lives with her family. Background history of Bipolar disorder and Borderline PD. Presented on account of suicidal behavior. Overdosed on 120 pills of Valium 5. Called her husband so she could talk to him before she dies. Reported to have had multiple admissions over the years. [facility]. Her husband works at a [employer].   Chart reviewed today. Patient discussed at team today. Her husband wants patient placed in a facility for mentally sick people. Feels that he is not at home and she cannot cope alone. We have agreed to a family session where we can review realistic options.  Staff reports that she has been more animated today. She has not voiced any suicidal or homicidal thoughts. Says she has been restless. She slept less yesterday.  Seen today. Patient reports feeling restless today. Says she has been up since 4:00 AM. Says her goal is to get good sleep tonight. Patient reports loneliness as a major issue. Says her husband works long hours and goes to the gymn after work. Says she gets panicky at home. Patient reports the feeling of abandonment as the cause of her recent overdose. Says she has three children. Two of them live out of state. Her son lives nearby but he is very busy with school and work. We explored ways of filling in her time  Says she has stopped going for her therapy session as she does not have anything more to talk about. We explored IOP. Patient pointed out transportation as an obstacle.  We reviewed her medications today. We have agreed to discontinue Duloxetine as that is the only new agent since she started feeling restless. We adjusted Seroquel as below. We agreed to discontinue Ropinirole as Seroquel would block it's effect. We agreed to use Gabapentin which is a second line agent for  treatment of RLS. She consented to treatment after we reviewed the risks and benefits.    Principal Problem: Major depressive disorder, recurrent severe without psychotic features (HCC) Diagnosis:   Patient Active Problem List   Diagnosis Date Noted  . Major depressive disorder, recurrent severe without psychotic features (HCC) [F33.2] 12/13/2016  . Overweight (BMI 25.0-29.9) [E66.3] 03/02/2016  . Personality disorder (HCC) [F60.9] 04/23/2013  . Bipolar 1 disorder, depressed (HCC) [F31.9] 09/29/2012  . Current every day smoker [F17.200] 02/17/2009  . Essential hypertension [I10] 02/17/2009   Total Time spent with patient: 30 minutes  Past Psychiatric History: As in H&P  Past Medical History:  Past Medical History:  Diagnosis Date  . Anxiety   . Bipolar disorder (HCC)   . Bronchitis, chronic (HCC)   . Depression   . Personality disorder [facility](HCC)     Past Surgical History:  Procedure Laterality Date  . CESAREAN SECTION  1983, 1988, 1992  . TONSILLECTOMY  age 56   Family History:  Family History  Problem Relation Age of Onset  . Depression Mother   . Alcohol abuse Mother   . Depression Father   . Alcohol abuse Father   . Alcohol abuse Sister   . Depression Sister   . Alcohol abuse Brother   . Depression Brother    Family Psychiatric  History: As in H&P Social History:  Social History   Substance and Sexual Activity  Alcohol Use No     Social History   Substance and Sexual  Activity  Drug Use No    Social History   Socioeconomic History  . Marital status: Married    Spouse name: None  . Number of children: None  . Years of education: None  . Highest education level: None  Social Needs  . Financial resource strain: None  . Food insecurity - worry: None  . Food insecurity - inability: None  . Transportation needs - medical: None  . Transportation needs - non-medical: None  Occupational History  . None  Tobacco Use  . Smoking status: Current Every Day  Smoker    Packs/day: 1.00    Types: Cigarettes  . Smokeless tobacco: Never Used  Substance and Sexual Activity  . Alcohol use: No  . Drug use: No  . Sexual activity: Yes    Partners: Male    Birth control/protection: None  Other Topics Concern  . None  Social History Narrative   10/29/2012 [name] Danielle Cox was born and grew up in [place], [place]. She has 2 brothers and one sister. She reports that her childhood was "poor," and explains that she needs both financially and emotionally. She completed the 10th grade, then achieved her GED. She has been married twice. The first time at age 50, and that ended after 1 year. She is currently married to her second husband of 27 years. She has 2 sons and one daughter. She is currently unemployed and on disability for 2 years. She lives with her husband, her daughter, and one of her daughter's friends. She denies any legal difficulties. She reports that she is spiritual but not religious. Her hobbies include reading and cross stitch. She reports that she has no social support network. 10/29/2012 [name]   Additional Social History:     Sleep: Poor  Appetite:  Fair  Current Medications: Current Facility-Administered Medications  Medication Dose Route Frequency Provider Last Rate Last Dose  . alum & mag hydroxide-simeth (MAALOX/MYLANTA) 200-200-20 MG/5ML suspension 30 mL  30 mL Oral Q4H PRN Charm Rings, NP      . carbamazepine (TEGRETOL XR) 12 hr tablet 300 mg  300 mg Oral BID Charm Rings, NP   300 mg at 12/19/16 0801  . cholecalciferol (VITAMIN D) tablet 1,000 Units  1,000 Units Oral Daily Charm Rings, NP   1,000 Units at 12/19/16 0802  . DULoxetine (CYMBALTA) DR capsule 30 mg  30 mg Oral Daily Money, Gerlene Burdock, FNP   30 mg at 12/19/16 0802  . fluticasone (FLONASE) 50 MCG/ACT nasal spray 2 spray  2 spray Each Nare BID PRN Money, Gerlene Burdock, FNP      . hydrOXYzine (ATARAX/VISTARIL) tablet 50 mg  50 mg Oral Q6H PRN Cobos, Rockey Situ, MD   50 mg at  12/19/16 1132  . ibuprofen (ADVIL,MOTRIN) tablet 600 mg  600 mg Oral Q6H PRN Money, Gerlene Burdock, FNP   600 mg at 12/19/16 0114  . magnesium hydroxide (MILK OF MAGNESIA) suspension 30 mL  30 mL Oral Daily PRN Charm Rings, NP   30 mL at 12/17/16 0810  . multivitamin with minerals tablet 1 tablet  1 tablet Oral Daily Cobos, Rockey Situ, MD   1 tablet at 12/19/16 0803  . nicotine (NICODERM CQ - dosed in mg/24 hours) patch 21 mg  21 mg Transdermal Daily Cobos, Rockey Situ, MD   21 mg at 12/19/16 0801  . QUEtiapine (SEROQUEL) tablet 200 mg  200 mg Oral QHS Money, Gerlene Burdock, FNP   200 mg at 12/18/16 2121  .  QUEtiapine (SEROQUEL) tablet 25 mg  25 mg Oral BID Money, Gerlene Burdock, FNP   25 mg at 12/19/16 4696  . rOPINIRole (REQUIP) tablet 0.5 mg  0.5 mg Oral QHS Cobos, Rockey Situ, MD   0.5 mg at 12/18/16 2121  . thiamine (VITAMIN B-1) tablet 100 mg  100 mg Oral Daily Cobos, Rockey Situ, MD   100 mg at 12/19/16 0802    Lab Results: No results found for this or any previous visit (from the past 48 hour(s)).  Blood Alcohol level:  Lab Results  Component Value Date   ETH <10 12/12/2016   ETH  08/03/2009    <5        LOWEST DETECTABLE LIMIT FOR SERUM ALCOHOL IS 5 mg/dL FOR MEDICAL PURPOSES ONLY    Metabolic Disorder Labs: Lab Results  Component Value Date   HGBA1C 5.6 12/15/2016   MPG 114 12/15/2016   No results found for: PROLACTIN Lab Results  Component Value Date   CHOL 301 (H) 12/15/2016   TRIG 270 (H) 12/15/2016   HDL 48 12/15/2016   CHOLHDL 6.3 12/15/2016   VLDL 54 (H) 12/15/2016   LDLCALC 199 (H) 12/15/2016   LDLCALC (H) 05/27/2008    149        Total Cholesterol/HDL:CHD Risk Coronary Heart Disease Risk Table                     Men   Women  1/2 Average Risk   3.4   3.3  Average Risk       5.0   4.4  2 X Average Risk   9.6   7.1  3 X Average Risk  23.4   11.0        Use the calculated Patient Ratio above and the CHD Risk Table to determine the patient's CHD Risk.        ATP III  CLASSIFICATION (LDL):  <100     mg/dL   Optimal  295-284  mg/dL   Near or Above                    Optimal  130-159  mg/dL   Borderline  132-440  mg/dL   High  >102     mg/dL   Very High    Physical Findings: AIMS: Facial and Oral Movements Muscles of Facial Expression: None, normal Lips and Perioral Area: None, normal Jaw: None, normal Tongue: None, normal,Extremity Movements Upper (arms, wrists, hands, fingers): None, normal Lower (legs, knees, ankles, toes): None, normal, Trunk Movements Neck, shoulders, hips: None, normal, Overall Severity Severity of abnormal movements (highest score from questions above): Minimal Incapacitation due to abnormal movements: None, normal Patient's awareness of abnormal movements (rate only patient's report): No Awareness, Dental Status Current problems with teeth and/or dentures?: No Does patient usually wear dentures?: No  CIWA:  CIWA-Ar Total: 4 COWS:  COWS Total Score: 2  Musculoskeletal: Strength & Muscle Tone: within normal limits Gait & Station: normal Patient leans: N/A  Psychiatric Specialty Exam: Physical Exam  ROS  Blood pressure 127/68, pulse 88, temperature 98 F (36.7 C), resp. rate 16, height 5\' 3"  (1.6 m), weight 70.3 kg (155 lb), last menstrual period 11/21/2016, SpO2 99 %.Body mass index is 27.46 kg/m.  General Appearance: Casually dressed. Was calm before interview. Became distraught while talking about how empty her life feels. Not internally distracted.   Eye Contact:  Fair  Speech:  Not pressured.  Volume:  Normal  Mood:  Dysphoric and Irritable  Affect:  Congruent  Thought Process:  Linear  Orientation:  Full (Time, Place, and Person)  Thought Content:  Negative ruminations. No hallucination in any modality.   Suicidal Thoughts:  Off and on. No current suicidal thoughts.   Homicidal Thoughts:  No  Memory:  Unable to assess at this time.   Judgement:  Fair  Insight:  Good  Psychomotor Activity:  Normal   Concentration:  Fair  Recall:  Unable to assess at this time.   Fund of Knowledge:  Fair  Language:  Good  Akathisia:  ?SNRI induced restlessness.   Handed:    AIMS (if indicated):     Assets:  Desire for Improvement Housing Intimacy  ADL's:  Intact  Cognition:  WNL  Sleep:  Number of Hours: 4.5     Treatment Plan Summary: Patient is feeling down today. She is reporting internal restlessness. She is tearful and overwhelmed. She wants to get better. We discussed medication adjustment as below. We hope to explore long term plans in a family session. We would evaluate her further and hopefully have a family session on Friday.    Psychiatric: Bipolar Disorder BLPD  Medical: HTN  Psychosocial:  Poor social outlet  PLAN: 1. Increase Seroquel to 300 mg at bedtime. Would discontinue daytime doses 2. Hold Ropinirole 3. Gabapentin 400 mg at bedtime to target RLS 4. Discontinue Duloxetine as she seems activated by it  5. Continue to monitor mood, behavior and interaction with peers 6. Family meeting on Friday.    Georgiann CockerVincent A Izediuno, MD 12/19/2016, 2:24 PM

## 2016-12-19 NOTE — Progress Notes (Signed)
Post 1:1 note. Patient remains safe on the unit. She has been using her standard walker most of the time, but she has been observed not using it. Patient was educated about the importance of ambulation safety, especially with her continued complaints of, "Racing thoughts and body."

## 2016-12-19 NOTE — Tx Team (Signed)
Interdisciplinary Treatment and Diagnostic Plan Update  12/19/2016 Time of Session: 10:53 AM  Danielle Cox MRN: [id number]  Principal Diagnosis: Major depressive disorder, recurrent severe without psychotic features (HCC)  Secondary Diagnoses: Principal Problem:   Major depressive disorder, recurrent severe without psychotic features (HCC)   Current Medications:  Current Facility-Administered Medications  Medication Dose Route Frequency Provider Last Rate Last Dose  . alum & mag hydroxide-simeth (MAALOX/MYLANTA) 200-200-20 MG/5ML suspension 30 mL  30 mL Oral Q4H PRN Lord, Jamison Y, NP      . carbamazepine (TEGRETOL XR) 12 hr tablet 300 mg  300 mg Oral BID Lord, Jamison Y, NP   300 mg at 12/19/16 0801  . cholecalciferol (VITAMIN D) tablet 1,000 Units  1,000 Units Oral Daily Lord, Jamison Y, NP   1,000 Units at 12/19/16 0802  . DULoxetine (CYMBALTA) DR capsule 30 mg  30 mg Oral Daily Money, Travis B, FNP   30 mg at 12/19/16 0802  . fluticasone (FLONASE) 50 MCG/ACT nasal spray 2 spray  2 spray Each Nare BID PRN Money, Travis B, FNP      . hydrOXYzine (ATARAX/VISTARIL) tablet 50 mg  50 mg Oral Q6H PRN Cobos, Fernando A, MD   50 mg at 12/19/16 0114  . ibuprofen (ADVIL,MOTRIN) tablet 600 mg  600 mg Oral Q6H PRN Money, Travis B, FNP   600 mg at 12/19/16 0114  . magnesium hydroxide (MILK OF MAGNESIA) suspension 30 mL  30 mL Oral Daily PRN Lord, Jamison Y, NP   30 mL at 12/17/16 0810  . multivitamin with minerals tablet 1 tablet  1 tablet Oral Daily Cobos, Fernando A, MD   1 tablet at 12/19/16 0803  . nicotine (NICODERM CQ - dosed in mg/24 hours) patch 21 mg  21 mg Transdermal Daily Cobos, Fernando A, MD   21 mg at 12/19/16 0801  . QUEtiapine (SEROQUEL) tablet 200 mg  200 mg Oral QHS Money, Travis B, FNP   200 mg at 12/18/16 2121  . QUEtiapine (SEROQUEL) tablet 25 mg  25 mg Oral BID Money, Travis B, FNP   25 mg at 12/19/16 0803  . rOPINIRole (REQUIP) tablet 0.5 mg  0.5 mg Oral QHS Cobos,  Fernando A, MD   0.5 mg at 12/18/16 2121  . thiamine (VITAMIN B-1) tablet 100 mg  100 mg Oral Daily Cobos, Fernando A, MD   100 mg at 12/19/16 0802    PTA Medications: Medications Prior to Admission  Medication Sig Dispense Refill Last Dose  . QUEtiapine (SEROQUEL XR) 300 MG 24 hr tablet Take 300 mg by mouth at bedtime.    12/11/2016 at Unknown time    Patient Stressors: Financial difficulties Traumatic event Other: chronic depression  Patient Strengths: General fund of knowledge Physical Health Supportive family/friends  Treatment Modalities: Medication Management, Group therapy, Case management,  1 to 1 session with clinician, Psychoeducation, Recreational therapy.   Physician Treatment Plan for Primary Diagnosis: Major depressive disorder, recurrent severe without psychotic features (HCC) Long Term Goal(s): Improvement in symptoms so as ready for discharge  Short Term Goals: Ability to identify changes in lifestyle to reduce recurrence of condition will improve Ability to maintain clinical measurements within normal limits will improve Ability to verbalize feelings will improve Ability to disclose and discuss suicidal ideas Ability to demonstrate self-control will improve Ability to identify and develop effective coping behaviors will improve Ability to maintain clinical measurements within normal limits will improve  Medication Management: Evaluate patient's response, side effects, and tolerance of medication   regimen.  Therapeutic Interventions: 1 to 1 sessions, Unit Group sessions and Medication administration.  Evaluation of Outcomes: Progressing  Physician Treatment Plan for Secondary Diagnosis: Principal Problem:   Major depressive disorder, recurrent severe without psychotic features ([place])   Long Term Goal(s): Improvement in symptoms so as ready for discharge  Short Term Goals: Ability to identify changes in lifestyle to reduce recurrence of condition will  improve Ability to maintain clinical measurements within normal limits will improve Ability to verbalize feelings will improve Ability to disclose and discuss suicidal ideas Ability to demonstrate self-control will improve Ability to identify and develop effective coping behaviors will improve Ability to maintain clinical measurements within normal limits will improve  Medication Management: Evaluate patient's response, side effects, and tolerance of medication regimen.  Therapeutic Interventions: 1 to 1 sessions, Unit Group sessions and Medication administration.  Evaluation of Outcomes: Progressing   RN Treatment Plan for Primary Diagnosis: Major depressive disorder, recurrent severe without psychotic features ([place]) Long Term Goal(s): Knowledge of disease and therapeutic regimen to maintain health will improve  Short Term Goals: Ability to identify and develop effective coping behaviors will improve and Compliance with prescribed medications will improve  Medication Management: RN will administer medications as ordered by provider, will assess and evaluate patient's response and provide education to patient for prescribed medication. RN will report any adverse and/or side effects to prescribing provider.  Therapeutic Interventions: 1 on 1 counseling sessions, Psychoeducation, Medication administration, Evaluate responses to treatment, Monitor vital signs and CBGs as ordered, Perform/monitor CIWA, COWS, AIMS and Fall Risk screenings as ordered, Perform wound care treatments as ordered.  Evaluation of Outcomes: Progressing   LCSW Treatment Plan for Primary Diagnosis: Major depressive disorder, recurrent severe without psychotic features ([place]) Long Term Goal(s): Safe transition to appropriate next level of care at discharge, Engage patient in therapeutic group addressing interpersonal concerns.  Short Term Goals: Engage patient in aftercare planning with referrals and  resources  Therapeutic Interventions: Assess for all discharge needs, 1 to 1 time with Social worker, Explore available resources and support systems, Assess for adequacy in community support network, Educate family and significant other(s) on suicide prevention, Complete Psychosocial Assessment, Interpersonal group therapy.  Evaluation of Outcomes: Met   Return home, follow up outpt with current provider  Progress in Treatment: Attending groups: Yes Participating in groups: Yes Taking medication as prescribed: Yes Toleration medication: Yes, no side effects reported at this time Family/Significant other contact made: No Patient understands diagnosis: Yes AEB Discussing patient identified problems/goals with staff: Yes Medical problems stabilized or resolved: Yes Denies suicidal/homicidal ideation: Yes Issues/concerns per patient self-inventory: None Other: N/A  New problem(s) identified: None identified at this time.   New Short Term/Long Term Goal(s): "I want to get my mind back straight"  Discharge Plan or Barriers: Pt has follow-up at [place] with her current psychiatrist and therapist. Per pt's husband, she will not go to [place] IOP if offered. CSW continuing to assess. [place] pamphlet provided for additional community support.   Reason for Continuation of Hospitalization: Depression Anxiety Medication stabilization  Estimated Length of Stay: 12/21/16 Friday (tentative and pending family session with pt's husband)  Attendees: Patient: Danielle Cox 12/19/2016  10:53 AM  Physician: Lanier Clam, MD 12/19/2016  10:53 AM  Nursing: Sena Hitch, RN 12/19/2016  10:53 AM  RN Care Manager: Lars Pinks, RN 12/19/2016  10:53 AM  Social Worker: Ripley Fraise 12/19/2016  10:53 AM  Recreational Therapist: Winfield Cunas 12/19/2016  10:53 AM  Other: Norberto Sorenson  12/19/2016  10:53 AM  Other:  12/19/2016  10:53 AM    Scribe for Treatment Team:  Rodney North LCSW 12/19/2016 10:53 AM    

## 2016-12-19 NOTE — Progress Notes (Signed)
Recreation Therapy Notes  Date:  12/19/16  Time: 0930 Location: [address] Group Room  Group Topic: Stress Management  Goal Area(s) Addresses:  Patient will verbalize importance of using healthy stress management.  Patient will identify positive emotions associated with healthy stress management.   Intervention: Stress Management  Activity :  Meditation.  LRT introduced the stress management technique of meditation.  Patients were to follow along with the meditation as it guided them on letting things that are burdensome in order to progress.  Education:  Stress Management, Discharge Planning.   Education Outcome: Acknowledges edcuation/In group clarification offered/Needs additional education  Clinical Observations/Feedback: Pt did not attend group.    Caroll RancherMarjette Giann Obara, LRT/CTRS         Caroll RancherLindsay, Javarious Elsayed A 12/19/2016 12:02 PM

## 2016-12-19 NOTE — Progress Notes (Signed)
1:1 note Pt complained of having period cramps at this time and received medication   She continues to be a high fall risk due to being impulsive and reporting lightheadedness   Pt remains on a 1:1 for safety   Pt remains safe

## 2016-12-19 NOTE — Progress Notes (Signed)
At 6pm patient came to nurses station breathing rapidly and holding her arms tightly wrapped around her body. She stated, "I am anxious and I am really paranoid. I feel all tingly." Patient was educated to decrease her breathing speed and to focus on keeping it slow. Patient sped up her breathing. PRN medications given and patient directed to go lay down to allow the medications to work. Patient has been checked on more frequently, during this time.

## 2016-12-19 NOTE — Progress Notes (Signed)
Patient states that she did not have a very good day. She states that her medication was changed today and that she is going through menopause. In terms of the theme for the day, her professional development will involve trying to get out of the house more often.

## 2016-12-19 NOTE — Progress Notes (Signed)
Patient continues to talk about her nausea and her shakiness. PRN medications offered, but patient reports, "I want the ativan that I have been getting every day/" Patient educated about PRN medications. No emesis reported or observed. Patient continues to be preoccupied with her body and to have a pinched, anxious affect. Continues to deny SI/HI/AVH.

## 2016-12-19 NOTE — Progress Notes (Signed)
D   Pt is rambling in the hallway asking when her medication will put her to sleep she took her medication less than an hour ago    She is obsessing over how she isnt going to sleep tonight and how she didn't sleep last night    Pt did attend group but her interactions with others is very limited A   Verbal support given   Medications administered and effectiveness monitored   Q 15 min checks R   Pt is safe at present time

## 2016-12-19 NOTE — Progress Notes (Signed)
Patient is post 1:1 staff observation. Patient reports, "My head is just racing and my body is racing too." Patient is using the standard walker as appropriate after education. Patient's affect is pinched and her mood is depressed and irritable. Patient remains safe on the unit.

## 2016-12-20 MED ORDER — GABAPENTIN 100 MG PO CAPS
200.0000 mg | ORAL_CAPSULE | Freq: Two times a day (BID) | ORAL | Status: DC
  Administered 2016-12-20 – 2016-12-23 (×6): 200 mg via ORAL
  Filled 2016-12-20 (×10): qty 2

## 2016-12-20 MED ORDER — ENSURE ENLIVE PO LIQD
237.0000 mL | Freq: Three times a day (TID) | ORAL | Status: DC | PRN
  Administered 2016-12-20 – 2016-12-24 (×3): 237 mL via ORAL
  Filled 2016-12-20 (×2): qty 237

## 2016-12-20 MED ORDER — LORAZEPAM 0.5 MG PO TABS
ORAL_TABLET | ORAL | Status: AC
  Filled 2016-12-20: qty 1

## 2016-12-20 MED ORDER — LORAZEPAM 0.5 MG PO TABS
0.5000 mg | ORAL_TABLET | Freq: Once | ORAL | Status: AC
  Administered 2016-12-20: 0.5 mg via ORAL

## 2016-12-20 NOTE — Progress Notes (Signed)
Did not attend evening wrap up group. °

## 2016-12-20 NOTE — Progress Notes (Signed)
[facility] Progress [facility]  12/20/2016 12:51 PM Danielle MartinezSheila K Cox  MRN:  [id number]   Subjective:  Patient reports that she feels worse today. She states that she did not sleep well last night. She reports feeling agitated today. She denies any SI/HI/AVh and contracts for safety. She reports severe depression and is scared about being discharged home.  Objective: Patient's chart and findings reviewed and discussed with treatment team. Will continue current medications. Will give current medications more time for effect. Patient just started increased doses last night. Will add gabapentin 200 mg BID to assist with agitation.  Principal Problem: Major depressive disorder, recurrent severe without psychotic features (HCC) Diagnosis:   Patient Active Problem List   Diagnosis Date Noted  . Major depressive disorder, recurrent severe without psychotic features (HCC) [F33.2] 12/13/2016  . Overweight (BMI 25.0-29.9) [E66.3] 03/02/2016  . Personality disorder (HCC) [F60.9] 04/23/2013  . Bipolar 1 disorder, depressed (HCC) [F31.9] 09/29/2012  . Current every day smoker [F17.200] 02/17/2009  . Essential hypertension [I10] 02/17/2009   Total Time spent with patient: 25 minutes  Past Psychiatric History: See H&P  Past Medical History:  Past Medical History:  Diagnosis Date  . Anxiety   . Bipolar disorder (HCC)   . Bronchitis, chronic (HCC)   . Depression   . Personality disorder [facility](HCC)     Past Surgical History:  Procedure Laterality Date  . CESAREAN SECTION  1983, 1988, 1992  . TONSILLECTOMY  age 56   Family History:  Family History  Problem Relation Age of Onset  . Depression Mother   . Alcohol abuse Mother   . Depression Father   . Alcohol abuse Father   . Alcohol abuse Sister   . Depression Sister   . Alcohol abuse Brother   . Depression Brother    Family Psychiatric  History: See H&P Social History:  Social History   Substance and Sexual Activity  Alcohol Use No     Social  History   Substance and Sexual Activity  Drug Use No    Social History   Socioeconomic History  . Marital status: Married    Spouse name: None  . Number of children: None  . Years of education: None  . Highest education level: None  Social Needs  . Financial resource strain: None  . Food insecurity - worry: None  . Food insecurity - inability: None  . Transportation needs - medical: None  . Transportation needs - non-medical: None  Occupational History  . None  Tobacco Use  . Smoking status: Current Every Day Smoker    Packs/day: 1.00    Types: Cigarettes  . Smokeless tobacco: Never Used  Substance and Sexual Activity  . Alcohol use: No  . Drug use: No  . Sexual activity: Yes    Partners: Male    Birth control/protection: None  Other Topics Concern  . None  Social History Narrative   10/29/2012 [name] [name] was born and grew up in [place], [place]. She has 2 brothers and one sister. She reports that her childhood was "poor," and explains that she needs both financially and emotionally. She completed the 10th grade, then achieved her GED. She has been married twice. The first time at age 56, and that ended after 1 year. She is currently married to her second husband of 27 years. She has 2 sons and one daughter. She is currently unemployed and on disability for 2 years. She lives with her husband, her daughter, and one of her daughter's  friends. She denies any legal difficulties. She reports that she is spiritual but not religious. Her hobbies include reading and cross stitch. She reports that she has no social support network. 10/29/2012 [name]   Additional Social History:                         Sleep: Fair  Appetite:  Good  Current Medications: Current Facility-Administered Medications  Medication Dose Route Frequency Provider Last Rate Last Dose  . alum & mag hydroxide-simeth (MAALOX/MYLANTA) 200-200-20 MG/5ML suspension 30 mL  30 mL Oral Q4H PRN Charm Rings, NP       . carbamazepine (TEGRETOL XR) 12 hr tablet 300 mg  300 mg Oral BID Charm Rings, NP   300 mg at 12/20/16 0756  . cholecalciferol (VITAMIN D) tablet 1,000 Units  1,000 Units Oral Daily Charm Rings, NP   1,000 Units at 12/20/16 0756  . fluticasone (FLONASE) 50 MCG/ACT nasal spray 2 spray  2 spray Each Nare BID PRN Geary Rufo, Gerlene Burdock, FNP   2 spray at 12/20/16 0755  . gabapentin (NEURONTIN) capsule 400 mg  400 mg Oral QHS Izediuno, Vincent A, MD   400 mg at 12/19/16 2113  . hydrOXYzine (ATARAX/VISTARIL) tablet 50 mg  50 mg Oral Q6H PRN Cobos, Rockey Situ, MD   50 mg at 12/19/16 2255  . ibuprofen (ADVIL,MOTRIN) tablet 600 mg  600 mg Oral Q6H PRN Lakechia Nay, Gerlene Burdock, FNP   600 mg at 12/20/16 1610  . LORazepam (ATIVAN) 0.5 MG tablet           . magnesium hydroxide (MILK OF MAGNESIA) suspension 30 mL  30 mL Oral Daily PRN Charm Rings, NP   30 mL at 12/17/16 0810  . multivitamin with minerals tablet 1 tablet  1 tablet Oral Daily Cobos, Rockey Situ, MD   1 tablet at 12/20/16 0756  . nicotine (NICODERM CQ - dosed in mg/24 hours) patch 21 mg  21 mg Transdermal Daily Cobos, Rockey Situ, MD   21 mg at 12/20/16 0756  . QUEtiapine (SEROQUEL) tablet 300 mg  300 mg Oral QHS Izediuno, Delight Ovens, MD   300 mg at 12/19/16 2113  . thiamine (VITAMIN B-1) tablet 100 mg  100 mg Oral Daily Cobos, Rockey Situ, MD   100 mg at 12/20/16 9604    Lab Results: No results found for this or any previous visit (from the past 48 hour(s)).  Blood Alcohol level:  Lab Results  Component Value Date   ETH <10 12/12/2016   ETH  08/03/2009    <5        LOWEST DETECTABLE LIMIT FOR SERUM ALCOHOL IS 5 mg/dL FOR MEDICAL PURPOSES ONLY    Metabolic Disorder Labs: Lab Results  Component Value Date   HGBA1C 5.6 12/15/2016   MPG 114 12/15/2016   No results found for: PROLACTIN Lab Results  Component Value Date   CHOL 301 (H) 12/15/2016   TRIG 270 (H) 12/15/2016   HDL 48 12/15/2016   CHOLHDL 6.3 12/15/2016   VLDL 54 (H)  12/15/2016   LDLCALC 199 (H) 12/15/2016   LDLCALC (H) 05/27/2008    149        Total Cholesterol/HDL:CHD Risk Coronary Heart Disease Risk Table                     Men   Women  1/2 Average Risk   3.4   3.3  Average Risk  5.0   4.4  2 X Average Risk   9.6   7.1  3 X Average Risk  23.4   11.0        Use the calculated Patient Ratio above and the CHD Risk Table to determine the patient's CHD Risk.        ATP III CLASSIFICATION (LDL):  <100     mg/dL   Optimal  [id number]  mg/dL   Near or Above                    Optimal  130-159  mg/dL   Borderline  [id number]  mg/dL   High  >956>190     mg/dL   Very High    Physical Findings: AIMS: Facial and Oral Movements Muscles of Facial Expression: None, normal Lips and Perioral Area: None, normal Jaw: None, normal Tongue: None, normal,Extremity Movements Upper (arms, wrists, hands, fingers): None, normal Lower (legs, knees, ankles, toes): None, normal, Trunk Movements Neck, shoulders, hips: None, normal, Overall Severity Severity of abnormal movements (highest score from questions above): None, normal Incapacitation due to abnormal movements: None, normal Patient's awareness of abnormal movements (rate only patient's report): No Awareness, Dental Status Current problems with teeth and/or dentures?: No Does patient usually wear dentures?: No  CIWA:  CIWA-Ar Total: 6 COWS:  COWS Total Score: 2  Musculoskeletal: Strength & Muscle Tone: within normal limits Gait & Station: normal Patient leans: N/A  Psychiatric Specialty Exam: Physical Exam  Nursing note and vitals reviewed. Constitutional: She is oriented to person, place, and time. She appears well-developed and well-nourished.  Cardiovascular: Normal rate.  Respiratory: Effort normal.  Musculoskeletal: Normal range of motion.  Neurological: She is alert and oriented to person, place, and time.  Skin: Skin is warm.    Review of Systems  Constitutional: Negative.   HENT:  Negative.   Eyes: Negative.   Respiratory: Negative.   Cardiovascular: Negative.   Gastrointestinal: Negative.   Genitourinary: Negative.   Musculoskeletal: Negative.   Skin: Negative.   Neurological: Negative.   Endo/Heme/Allergies: Negative.   Psychiatric/Behavioral: Positive for depression and suicidal ideas. Negative for hallucinations. The patient is nervous/anxious.     Blood pressure 108/61, pulse 79, temperature 98 F (36.7 C), temperature source Oral, resp. rate 18, height 5\' 3"  (1.6 m), weight 70.3 kg (155 lb), last menstrual period 11/21/2016, SpO2 99 %.Body mass index is 27.46 kg/m.  General Appearance: Casual  Eye Contact:  Fair  Speech:  Clear and Coherent and Normal Rate  Volume:  Normal  Mood:  Dysphoric  Affect:  Depressed and Tearful  Thought Process:  Goal Directed and Descriptions of Associations: Intact  Orientation:  Full (Time, Place, and Person)  Thought Content:  WDL  Suicidal Thoughts:  No  Homicidal Thoughts:  No  Memory:  Immediate;   Good Recent;   Good Remote;   Good  Judgement:  Fair  Insight:  Fair  Psychomotor Activity:  Normal  Concentration:  Concentration: Good and Attention Span: Good  Recall:  Good  Fund of Knowledge:  Good  Language:  Good  Akathisia:  No  Handed:  Right  AIMS (if indicated):     Assets:  Communication Skills Desire for Improvement Financial Resources/Insurance Housing Social Support Transportation  ADL's:  Intact  Cognition:  WNL  Sleep:  Number of Hours: 3.75   Problems Addressed: MDD severe  Treatment Plan Summary: Daily contact with patient to assess and evaluate symptoms and progress in treatment, Medication management and  Plan is to:  -Continue Tegretol 300 mg PO BID -Start Gabapentin 200 mg BID for agitation -Continue Gabapentin 400 mg PO QHS for sleep and restless leg -Continue Seroquel 300 mg PO QHS for mood stability -Encourage group therapy participation  Maryfrances Bunnell, FNP 12/20/2016,  12:51 PM

## 2016-12-20 NOTE — Plan of Care (Signed)
  Health Behavior/Discharge Planning: Ability to make decisions will improve 12/20/2016 1651 - Not Progressing by Lenord Fellersopson, Liat Mayol Elizabeth, RN

## 2016-12-20 NOTE — Progress Notes (Signed)
Patient ID: Monica MartinezSheila K Husk, female   DOB: 06/18/1960, 56 y.o.   MRN: [id number]  DAR: Pt. Denies HI and A/V Hallucinations. She refused to answer whether she is suicidal or not. NP Money was notified that patient would not answer writer when asked this question. On her daily inventory sheet, she reports that her sleep last night was fair, her appetite is poor, her energy level is low, and her concentration is poor. She reports, "I ate a little oatmeal this morning." She rates her depression, hopelessness, and anxiety levels 10/10. Patient does report some abdominal cramping and received PRN medication for this. Support and encouragement provided to the patient. Writer sat with patient a few times and attempted to work on a workbook with patient however at this time patient is too upset and crying to focus on her workbook. Writer has encouraged patient to take slow deep breaths. Patient appears tense, her muscles in his shoulders and jaw are tight. She is encouraged to relax and speak to writer about her feelings and anxiety. Patient at this time is guarded and asks to be left alone in her room. She received a one time dose of Ativan which was helpful for about an hour. However, patient began to cry again and stated, "I feel paranoid, and angry, and anxious." Writer again sat with patient and attempted guided imagery as patient reports that she likes the beach. She reports today is not a good day for her. Writer relayed this information to NP Money. Q15 minute checks are maintained for safety.

## 2016-12-20 NOTE — Progress Notes (Signed)
Phone conference scheduled for Friday, 12/21/16 at 11:30AM with pt's husband, pt, CSW, and MD. [name] is unable to come until visitation hours due to working in [place] during the day.   Trula SladeHeather Smart, MSW, LCSW Clinical Social Worker 12/20/2016 1:08 PM

## 2016-12-21 MED ORDER — CHLORPROMAZINE HCL 100 MG PO TABS
100.0000 mg | ORAL_TABLET | Freq: Every evening | ORAL | Status: DC | PRN
  Administered 2016-12-22 – 2016-12-24 (×3): 100 mg via ORAL
  Filled 2016-12-21: qty 1

## 2016-12-21 MED ORDER — CHLORPROMAZINE HCL 100 MG PO TABS
100.0000 mg | ORAL_TABLET | Freq: Every day | ORAL | Status: DC
  Administered 2016-12-21: 100 mg via ORAL
  Filled 2016-12-21 (×3): qty 1

## 2016-12-21 MED ORDER — CARBAMAZEPINE ER 200 MG PO TB12
600.0000 mg | ORAL_TABLET | Freq: Two times a day (BID) | ORAL | Status: DC
  Administered 2016-12-21 – 2016-12-28 (×15): 600 mg via ORAL
  Filled 2016-12-21 (×18): qty 3

## 2016-12-21 NOTE — Progress Notes (Addendum)
Adult Psychoeducational Group Note  Date:  12/21/2016 Time:  11:40 PM  Group Topic/Focus:  Wrap-Up Group:   The focus of this group is to help patients review their daily goal of treatment and discuss progress on daily workbooks.  Participation Level:  Minimal  Participation Quality:  Appropriate  Affect:  Flat  Cognitive:  Lacking  Insight: Lacking  Engagement in Group:  Engaged  Modes of Intervention:  Discussion  Additional Comments:  Patient attended group and said that her day was a 2.  Her coping skills were sleeping, watching tv and walking.   Laporscha Linehan W Klyn Kroening 12/21/2016, 11:40 PM

## 2016-12-21 NOTE — Progress Notes (Signed)
[facility] MD Progress Note  12/21/2016 12:07 PM Danielle Cox  MRN:  [id number] Subjective:   56 y.o Caucasian female, married, lives with her family. Background history of Bipolar disorder and Borderline PD. Presented on account of suicidal behavior. Overdosed on 120 pills of Valium 5. Called her husband so she could talk to him before she dies. Reported to have had multiple admissions over the years. She was recently discharged from [facility]. Her husband works at a [employer].   Chart reviewed today. Patient discussed at team today. Her husband wants patient placed in a facility for mentally sick people. Feels that he is not at home and she cannot cope alone. We have agreed to a family session where we can review realistic options.  Staff reports that she has been having anxiety attacks. She has required a lot of PRN lately. She has not bene able to participate with unit groups and activities.   Seen today. Reports not feeling good. Says she has not been able to sleep well at night. Says she gets more restless after she takes her Seroquel. She has not been eating much. Says she does not have appetite. She feels overwhelmed most of the time. Patient reports feeling hopeless and worthless. Says thoughts of suicide are stronger lately. No hallucination in any modality.  I spoke with her husband today. He is very worried as each time he was here she was very distraught. We reviewed treatment over the years. Says patient has been tried on various medications. She did not respond to ECT. We explored long term options. Group home placement is an option as she does not have any means of attending IOP.    Principal Problem: Major depressive disorder, recurrent severe without psychotic features (HCC) Diagnosis:   Patient Active Problem List   Diagnosis Date Noted  . Major depressive disorder, recurrent severe without psychotic features (HCC) [F33.2] 12/13/2016  . Overweight  (BMI 25.0-29.9) [E66.3] 03/02/2016  . Personality disorder (HCC) [F60.9] 04/23/2013  . Bipolar 1 disorder, depressed (HCC) [F31.9] 09/29/2012  . Current every day smoker [F17.200] 02/17/2009  . Essential hypertension [I10] 02/17/2009   Total Time spent with patient: 30 minutes  Past Psychiatric History: As in H&P  Past Medical History:  Past Medical History:  Diagnosis Date  . Anxiety   . Bipolar disorder (HCC)   . Bronchitis, chronic (HCC)   . Depression   . Personality disorder [facility])     Past Surgical History:  Procedure Laterality Date  . CESAREAN SECTION  1983, 1988, 1992  . TONSILLECTOMY  age 83   Family History:  Family History  Problem Relation Age of Onset  . Depression Mother   . Alcohol abuse Mother   . Depression Father   . Alcohol abuse Father   . Alcohol abuse Sister   . Depression Sister   . Alcohol abuse Brother   . Depression Brother    Family Psychiatric  History: As in H&P Social History:  Social History   Substance and Sexual Activity  Alcohol Use No     Social History   Substance and Sexual Activity  Drug Use No    Social History   Socioeconomic History  . Marital status: Married    Spouse name: None  . Number of children: None  . Years of education: None  . Highest education level: None  Social Needs  . Financial resource strain: None  . Food insecurity - worry: None  . Food insecurity -  inability: None  . Transportation needs - medical: None  . Transportation needs - non-medical: None  Occupational History  . None  Tobacco Use  . Smoking status: Current Every Day Smoker    Packs/day: 1.00    Types: Cigarettes  . Smokeless tobacco: Never Used  Substance and Sexual Activity  . Alcohol use: No  . Drug use: No  . Sexual activity: Yes    Partners: Male    Birth control/protection: None  Other Topics Concern  . None  Social History Narrative   10/29/2012 [name] Natasia was born and grew up in [place], [place]. She has 2 brothers  and one sister. She reports that her childhood was "poor," and explains that she needs both financially and emotionally. She completed the 10th grade, then achieved her GED. She has been married twice. The first time at age 44, and that ended after 1 year. She is currently married to her second husband of 27 years. She has 2 sons and one daughter. She is currently unemployed and on disability for 2 years. She lives with her husband, her daughter, and one of her daughter's friends. She denies any legal difficulties. She reports that she is spiritual but not religious. Her hobbies include reading and cross stitch. She reports that she has no social support network. 10/29/2012 [name]   Additional Social History:     Sleep: Poor  Appetite:  Fair  Current Medications: Current Facility-Administered Medications  Medication Dose Route Frequency Provider Last Rate Last Dose  . alum & mag hydroxide-simeth (MAALOX/MYLANTA) 200-200-20 MG/5ML suspension 30 mL  30 mL Oral Q4H PRN Charm Rings, NP      . carbamazepine (TEGRETOL XR) 12 hr tablet 300 mg  300 mg Oral BID Charm Rings, NP   300 mg at 12/21/16 0843  . cholecalciferol (VITAMIN D) tablet 1,000 Units  1,000 Units Oral Daily Charm Rings, NP   1,000 Units at 12/21/16 [phone]  . feeding supplement (ENSURE ENLIVE) (ENSURE ENLIVE) liquid 237 mL  237 mL Oral TID WC PRN Money, Gerlene Burdock, FNP   237 mL at 12/21/16 0905  . fluticasone (FLONASE) 50 MCG/ACT nasal spray 2 spray  2 spray Each Nare BID PRN Money, Gerlene Burdock, FNP   2 spray at 12/21/16 0843  . gabapentin (NEURONTIN) capsule 200 mg  200 mg Oral BID Money, Gerlene Burdock, FNP   200 mg at 12/21/16 0844  . gabapentin (NEURONTIN) capsule 400 mg  400 mg Oral QHS Izediuno, Vincent A, MD   400 mg at 12/20/16 2120  . hydrOXYzine (ATARAX/VISTARIL) tablet 50 mg  50 mg Oral Q6H PRN Cobos, Rockey Situ, MD   50 mg at 12/21/16 0548  . ibuprofen (ADVIL,MOTRIN) tablet 600 mg  600 mg Oral Q6H PRN Money, Gerlene Burdock, FNP   600 mg  at 12/20/16 9604  . magnesium hydroxide (MILK OF MAGNESIA) suspension 30 mL  30 mL Oral Daily PRN Charm Rings, NP   30 mL at 12/17/16 0810  . multivitamin with minerals tablet 1 tablet  1 tablet Oral Daily Cobos, Rockey Situ, MD   1 tablet at 12/21/16 0843  . nicotine (NICODERM CQ - dosed in mg/24 hours) patch 21 mg  21 mg Transdermal Daily Cobos, Rockey Situ, MD   21 mg at 12/21/16 0903  . QUEtiapine (SEROQUEL) tablet 300 mg  300 mg Oral QHS Izediuno, Delight Ovens, MD   300 mg at 12/20/16 2120  . thiamine (VITAMIN B-1) tablet 100 mg  100 mg  Oral Daily Cobos, Rockey SituFernando A, MD   100 mg at 12/21/16 [id number]    Lab Results: No results found for this or any previous visit (from the past 48 hour(s)).  Blood Alcohol level:  Lab Results  Component Value Date   ETH <10 12/12/2016   ETH  08/03/2009    <5        LOWEST DETECTABLE LIMIT FOR SERUM ALCOHOL IS 5 mg/dL FOR MEDICAL PURPOSES ONLY    Metabolic Disorder Labs: Lab Results  Component Value Date   HGBA1C 5.6 12/15/2016   MPG 114 12/15/2016   No results found for: PROLACTIN Lab Results  Component Value Date   CHOL 301 (H) 12/15/2016   TRIG 270 (H) 12/15/2016   HDL 48 12/15/2016   CHOLHDL 6.3 12/15/2016   VLDL 54 (H) 12/15/2016   LDLCALC 199 (H) 12/15/2016   LDLCALC (H) 05/27/2008    149        Total Cholesterol/HDL:CHD Risk Coronary Heart Disease Risk Table                     Men   Women  1/2 Average Risk   3.4   3.3  Average Risk       5.0   4.4  2 X Average Risk   9.6   7.1  3 X Average Risk  23.4   11.0        Use the calculated Patient Ratio above and the CHD Risk Table to determine the patient's CHD Risk.        ATP III CLASSIFICATION (LDL):  <100     mg/dL   Optimal  191-478100-129  mg/dL   Near or Above                    Optimal  130-159  mg/dL   Borderline  [id number]  mg/dL   High  >308>190     mg/dL   Very High    Physical Findings: AIMS: Facial and Oral Movements Muscles of Facial Expression: None, normal Lips and  Perioral Area: None, normal Jaw: None, normal Tongue: None, normal,Extremity Movements Upper (arms, wrists, hands, fingers): None, normal Lower (legs, knees, ankles, toes): None, normal, Trunk Movements Neck, shoulders, hips: None, normal, Overall Severity Severity of abnormal movements (highest score from questions above): None, normal Incapacitation due to abnormal movements: None, normal Patient's awareness of abnormal movements (rate only patient's report): No Awareness, Dental Status Current problems with teeth and/or dentures?: No Does patient usually wear dentures?: No  CIWA:  CIWA-Ar Total: 8 COWS:  COWS Total Score: 2  Musculoskeletal: Strength & Muscle Tone: within normal limits Gait & Station: normal Patient leans: N/A  Psychiatric Specialty Exam: Physical Exam  ROS  Blood pressure (!) 110/45, pulse 100, temperature 98.8 F (37.1 C), temperature source Oral, resp. rate 20, height 5\' 3"  (1.6 m), weight 70.3 kg (155 lb), last menstrual period 11/21/2016, SpO2 100 %.Body mass index is 27.46 kg/m.  General Appearance: In bed, tearful and tense.   Eye Contact:  Moderate  Speech: Spontaneous, soft spoken.  Volume:  Normal  Mood:  Dysphoric and Irritable  Affect:  Congruent  Thought Process:  Linear  Orientation:  Full (Time, Place, and Person)  Thought Content:  Negative ruminations. Hopelessness and worthlessness.  No hallucination in any modality.   Suicidal Thoughts:  Stronger lately  Homicidal Thoughts:  No  Memory:  Unable to assess at this time.   Judgement:  Fair  Insight:  Good  Psychomotor Activity:  Normal  Concentration:  Fair  Recall:  Unable to assess at this time.   Fund of Knowledge:  Fair  Language:  Good  Akathisia:  Restless   Handed:    AIMS (if indicated):     Assets:  Desire for Improvement Housing Intimacy  ADL's:  Intact  Cognition:  WNL  Sleep:  Number of Hours: 3.5     Treatment Plan Summary: Patient is still very dysphoric. She  has been having crying spells and anxiety attacks. She continues to feel hopeless and worthless. She continues to express suicidal thoughts. We plan to adjust her medications as below today.    Psychiatric: Bipolar Disorder BLPD  Medical: HTN  Psychosocial:  Poor social outlet  PLAN: 1. Discontinue Seroquel  2. Chlorpromazine 100 mg HS 3. Increase Carbamazepine to 600 mg BID 4.  Continue to monitor mood, behavior and interaction with peers     Georgiann CockerVincent A Izediuno, MD 12/21/2016, 12:07 PMPatient ID: Monica MartinezSheila K Lun, female   DOB: 1960/03/08, 56 y.o.   MRN: [id number]

## 2016-12-21 NOTE — Progress Notes (Signed)
D patient is disorganized, pacing, depressed, emotional and cries off and on this morning. Writer tried to console patient and when Clinical research associatewriter asked pt what she was upset about she said " everything". Pt stated she is " upset" but unable to pinpoint her feelings. A She completed her daily assessment and on this she wrote she deneid SI today and she rated her depression, hopelessness and anxeity " 10/24/08", respectively. She has a tardive-like breathing / mouth movement that she repeats involuntarily. R Safety in place dc planning cont.

## 2016-12-21 NOTE — Progress Notes (Signed)
Patient ID: Danielle MartinezSheila K Saraceno, female   DOB: 07-02-1960, 56 y.o.   MRN: [id number]  DAR Note: Pt was pacing the hallway for most of the evening. Pt at the time of endorsed severe anxiety, depression, helplessness, hopelessness and was very tearful. "I just don't know what is wrong with me." Pt denied pain, SI, HI of AVH. . Support, encouragement, and safe environment provided.  15-minute safety checks continue. All Pt's questions and concerns addressed. Pt was med compliant. Pt did not attend wrap-up group.

## 2016-12-21 NOTE — Progress Notes (Signed)
D.  Pt very anxious on approach, states she has restless leg and it keeps her awake at night.  Pt states doctor was going to start her on something new.  Pt was positive for evening wrap up group, minimal interaction on unit.  Pt denies SI/HI/AVH at this time.  A.  Support and encouragement offered, medication given as ordered.  R.  Pt remains safe on the unit, will continue to monitor.

## 2016-12-21 NOTE — Progress Notes (Signed)
Recreation Therapy Notes  Date: 12/21/16 Time: 0930 Location: 300 Hall Dayroom  Group Topic: Stress Management  Goal Area(s) Addresses:  Patient will verbalize importance of using healthy stress management.  Patient will identify positive emotions associated with healthy stress management.   Intervention: Stress Management  Activity :  LRT introduced the stress management technique of meditation.  LRT read a script to allow patients to focus on being steadfast in different situations.  Education:  Stress Management, Discharge Planning.   Education Outcome: Acknowledges edcuation/In group clarification offered/Needs additional education  Clinical Observations/Feedback: Pt did not attend group.    Colter Magowan, LRT/CTRS         Briante Loveall A 12/21/2016 10:38 AM 

## 2016-12-21 NOTE — Progress Notes (Signed)
Pt walking hallway and came to dayroom to get vitals taken but pt is very tearful and anxious. Pt is pacing and shaking with heavy quick breathing. Pt states, I don't want to feel like this, I feel hopeless, scared, and terrible and I don't want to feel like this. Pt supported and reminded we are here for her and that she is safe her. Nurse also provided medication.

## 2016-12-22 MED ORDER — CHLORPROMAZINE HCL 100 MG PO TABS
200.0000 mg | ORAL_TABLET | Freq: Every day | ORAL | Status: DC
  Administered 2016-12-22 – 2016-12-24 (×3): 200 mg via ORAL
  Filled 2016-12-22 (×4): qty 2

## 2016-12-22 MED ORDER — LORAZEPAM 1 MG PO TABS
ORAL_TABLET | ORAL | Status: AC
  Administered 2016-12-22: 15:00:00
  Filled 2016-12-22: qty 1

## 2016-12-22 MED ORDER — LORAZEPAM 1 MG PO TABS: 1.0000 mg | ORAL_TABLET | Freq: Once | ORAL | Status: DC

## 2016-12-22 NOTE — Progress Notes (Signed)
D.  Pt states she feels dizzy and very shakey on her legs this morning.  Pt tearful.  Pt states her legs were moving all night and she didn't feel like she could be still.   BP 111/45 HR 94   A.  Notified NP of Pt's comments,  Pt advised to stay back from breakfast and remain in bed until doctor arrives this morning.  R.  Pt in bed at this time awake, no acute distress noted.  Will continue to monitor.

## 2016-12-22 NOTE — [facility] Group Notes (Signed)
[facility]  Date/Time:    12/22/2016 10:00-11:00AM  Type of Therapy and Topic:  Group Therapy:  Healthy vs Unhealthy Coping Skills  Participation Level:  Active   Description of Group:  The focus of this group was to determine what unhealthy coping techniques typically are used by group members and what healthy coping techniques would be helpful in coping with various problems. Patients were guided in becoming aware of the differences between healthy and unhealthy coping techniques.  Patients were asked to identify 1-2 healthy coping skills they would like to learn to use more effectively,.  Therapeutic Goals 1. Patients learned that coping is what human beings do all day long to deal with various situations in their lives 2. Patients defined and discussed healthy vs unhealthy coping techniques 3. Patients identified their preferred coping techniques and identified whether these were healthy or unhealthy 4. Patients determined 1-2 healthy coping skills they would like to become more familiar with and use more often, and practiced a few meditations 5. Patients provided support and ideas to each other  Summary of Patient Progress: During group, patient expressed that she is anxious, nervous and lonely, and since so many people have left the hospital and that means there are fewer staff present, she does not feel "as safe."  She would become tearful, then regain control, but she remained visibly depressed throughout group.   Therapeutic Modalities Cognitive Behavioral Therapy Motivational Interviewing   Ambrose MantleMareida Grossman-Orr, LCSW 12/22/2016, 12:32 PM

## 2016-12-22 NOTE — Progress Notes (Signed)
[facility]  12/22/2016 2:52 PM Danielle MartinezSheila K Cox  MRN:  [id number] Subjective:   56 y.o Caucasian female, married, lives with her family. Background history of Bipolar disorder and Borderline PD. Presented on account of suicidal behavior. Overdosed on 120 pills of Valium 5. Called her husband so she could talk to him before she dies. Reported to have had multiple admissions over the years. [facility]. Her husband works at a [employer].   Chart reviewed today. Patient discussed at team today. Her husband wants patient placed in a facility for mentally sick people. Feels that he is not at home and she cannot cope alone. We have agreed to a family session where we can review realistic options.  Staff reports that she remains very anxious. She has been tearful today. She has not been able to engage in most activities. She has not been observed to be internally stimulated.   Seen today. She slept relatively better after taking additional dose of Chlorpromazine. Says she felt light headed this morning. Reassured that the effect would gradually get better. Encouraged to change positions from supine to erect gradually.  Patient continues to express a lot of anxiety. She has been scratching her legs a lot. Says she just does not know what to do anymore. Still has feelings of suicide.    Principal Problem: Major depressive disorder, recurrent severe without psychotic features (HCC) Diagnosis:   Patient Active Problem List   Diagnosis Date Noted  . Major depressive disorder, recurrent severe without psychotic features (HCC) [F33.2] 12/13/2016  . Overweight (BMI 25.0-29.9) [E66.3] 03/02/2016  . Personality disorder (HCC) [F60.9] 04/23/2013  . Bipolar 1 disorder, depressed (HCC) [F31.9] 09/29/2012  . Current every day smoker [F17.200] 02/17/2009  . Essential hypertension [I10] 02/17/2009   Total Time spent with patient: 30  minutes  Past Psychiatric History: As in H&P  Past Medical History:  Past Medical History:  Diagnosis Date  . Anxiety   . Bipolar disorder (HCC)   . Bronchitis, chronic (HCC)   . Depression   . Personality disorder [facility](HCC)     Past Surgical History:  Procedure Laterality Date  . CESAREAN SECTION  1983, 1988, 1992  . TONSILLECTOMY  age 56   Family History:  Family History  Problem Relation Age of Onset  . Depression Mother   . Alcohol abuse Mother   . Depression Father   . Alcohol abuse Father   . Alcohol abuse Sister   . Depression Sister   . Alcohol abuse Brother   . Depression Brother    Family Psychiatric  History: As in H&P Social History:  Social History   Substance and Sexual Activity  Alcohol Use No     Social History   Substance and Sexual Activity  Drug Use No    Social History   Socioeconomic History  . Marital status: Married    Spouse name: None  . Number of children: None  . Years of education: None  . Highest education level: None  Social Needs  . Financial resource strain: None  . Food insecurity - worry: None  . Food insecurity - inability: None  . Transportation needs - medical: None  . Transportation needs - non-medical: None  Occupational History  . None  Tobacco Use  . Smoking status: Current Every Day Smoker    Packs/day: 1.00    Types: Cigarettes  . Smokeless tobacco: Never Used  Substance and Sexual Activity  .  Alcohol use: No  . Drug use: No  . Sexual activity: Yes    Partners: Male    Birth control/protection: None  Other Topics Concern  . None  Social History Narrative   10/29/2012 [name] [name] was born and grew up in [place], [place]. She has 2 brothers and one sister. She reports that her childhood was "poor," and explains that she needs both financially and emotionally. She completed the 10th grade, then achieved her GED. She has been married twice. The first time at age [age], and that ended after 1 year. She is currently  married to her second husband of 27 years. She has 2 sons and one daughter. She is currently unemployed and on disability for 2 years. She lives with her husband, her daughter, and one of her daughter's friends. She denies any legal difficulties. She reports that she is spiritual but not religious. Her hobbies include reading and cross stitch. She reports that she has no social support network. 10/29/2012 [name]   Additional Social History:     Sleep: Poor  Appetite:  Fair  Current Medications: Current Facility-Administered Medications  Medication Dose Route Frequency Provider Last Rate Last Dose  . alum & mag hydroxide-simeth (MAALOX/MYLANTA) 200-200-20 MG/5ML suspension 30 mL  30 mL Oral Q4H PRN Charm RingsLord, Jamison Y, NP      . carbamazepine (TEGRETOL XR) 12 hr tablet 600 mg  600 mg Oral BID Georgiann CockerIzediuno, Vincent A, MD   600 mg at 12/22/16 0904  . chlorproMAZINE (THORAZINE) tablet 100 mg  100 mg Oral QHS PRN Georgiann CockerIzediuno, Vincent A, MD   100 mg at 12/22/16 0218  . chlorproMAZINE (THORAZINE) tablet 100 mg  100 mg Oral QHS Izediuno, Delight OvensVincent A, MD   100 mg at 12/21/16 2133  . cholecalciferol (VITAMIN D) tablet 1,000 Units  1,000 Units Oral Daily Charm RingsLord, Jamison Y, NP   1,000 Units at 12/22/16 [id number]  . feeding supplement (ENSURE ENLIVE) (ENSURE ENLIVE) liquid 237 mL  237 mL Oral TID WC PRN Money, [name], FNP   237 mL at 12/21/16 0905  . fluticasone (FLONASE) 50 MCG/ACT nasal spray 2 spray  2 spray Each Nare BID PRN Money, [name], FNP   2 spray at 12/21/16 0843  . gabapentin (NEURONTIN) capsule 200 mg  200 mg Oral BID Money, [name], FNP   200 mg at 12/22/16 0905  . gabapentin (NEURONTIN) capsule 400 mg  400 mg Oral QHS Izediuno, Vincent A, MD   400 mg at 12/21/16 2133  . hydrOXYzine (ATARAX/VISTARIL) tablet 50 mg  50 mg Oral Q6H PRN Cobos, Rockey SituFernando A, MD   50 mg at 12/22/16 0910  . ibuprofen (ADVIL,MOTRIN) tablet 600 mg  600 mg Oral Q6H PRN Money, [name], FNP   600 mg at 12/20/16 [id number]  . magnesium hydroxide  (MILK OF MAGNESIA) suspension 30 mL  30 mL Oral Daily PRN Charm RingsLord, Jamison Y, NP   30 mL at 12/17/16 0810  . multivitamin with minerals tablet 1 tablet  1 tablet Oral Daily Cobos, Rockey SituFernando A, MD   1 tablet at 12/22/16 0906  . nicotine (NICODERM CQ - dosed in mg/24 hours) patch 21 mg  21 mg Transdermal Daily Cobos, Rockey SituFernando A, MD   21 mg at 12/22/16 0904  . thiamine (VITAMIN B-1) tablet 100 mg  100 mg Oral Daily Cobos, Rockey SituFernando A, MD   100 mg at 12/22/16 [id number]    Lab Results: No results found for this or any previous visit (from the past 48 hour(s)).  Blood Alcohol level:  Lab Results  Component Value Date   ETH <10 12/12/2016   ETH  08/03/2009    <5        LOWEST DETECTABLE LIMIT FOR SERUM ALCOHOL IS 5 mg/dL FOR MEDICAL PURPOSES ONLY    Metabolic Disorder Labs: Lab Results  Component Value Date   HGBA1C 5.6 12/15/2016   MPG 114 12/15/2016   No results found for: PROLACTIN Lab Results  Component Value Date   CHOL 301 (H) 12/15/2016   TRIG 270 (H) 12/15/2016   HDL 48 12/15/2016   CHOLHDL 6.3 12/15/2016   VLDL 54 (H) 12/15/2016   LDLCALC 199 (H) 12/15/2016   LDLCALC (H) 05/27/2008    149        Total Cholesterol/HDL:CHD Risk Coronary Heart Disease Risk Table                     Men   Women  1/2 Average Risk   3.4   3.3  Average Risk       5.0   4.4  2 X Average Risk   9.6   7.1  3 X Average Risk  23.4   11.0        Use the calculated Patient Ratio above and the CHD Risk Table to determine the patient's CHD Risk.        ATP III CLASSIFICATION (LDL):  <100     mg/dL   Optimal  161-096  mg/dL   Near or Above                    Optimal  130-159  mg/dL   Borderline  045-409  mg/dL   High  >811     mg/dL   Very High    Physical Findings: AIMS: Facial and Oral Movements Muscles of Facial Expression: None, normal Lips and Perioral Area: None, normal Jaw: None, normal Tongue: None, normal,Extremity Movements Upper (arms, wrists, hands, fingers): None, normal Lower  (legs, knees, ankles, toes): None, normal, Trunk Movements Neck, shoulders, hips: None, normal, Overall Severity Severity of abnormal movements (highest score from questions above): None, normal Incapacitation due to abnormal movements: None, normal Patient's awareness of abnormal movements (rate only patient's report): No Awareness, Dental Status Current problems with teeth and/or dentures?: No Does patient usually wear dentures?: No  CIWA:  CIWA-Ar Total: 8 COWS:  COWS Total Score: 2  Musculoskeletal: Strength & Muscle Tone: within normal limits Gait & Station: normal Patient leans: N/A  Psychiatric Specialty Exam: Physical Exam  Constitutional: No distress.  HENT:  Head: Normocephalic and atraumatic.  Neurological: She is alert.  Psychiatric:  As above    ROS  Blood pressure (!) 111/45, pulse 94, temperature 98.5 F (36.9 C), temperature source Oral, resp. rate 18, height 5\' 3"  (1.6 m), weight 70.3 kg (155 lb), SpO2 100 %.Body mass index is 27.46 kg/m.  General Appearance:  Tearful and tense.   Eye Contact:  Moderate  Speech: Spontaneous, soft spoken.  Volume:  Normal  Mood:  Dysphoric and Irritable  Affect:  Congruent  Thought Process:  Linear  Orientation:  Full (Time, Place, and Person)  Thought Content:  Negative ruminations. Hopelessness and worthlessness.  No hallucination in any modality.   Suicidal Thoughts:  Stronger lately  Homicidal Thoughts:  No  Memory:  Unable to assess at this time.   Judgement:  Fair  Insight:  Good  Psychomotor Activity:  Normal  Concentration:  Fair  Recall:  Unable to  assess at this time.   Fund of Knowledge:  Fair  Language:  Good  Akathisia:  Restless   Handed:    AIMS (if indicated):     Assets:  Desire for Improvement Housing Intimacy  ADL's:  Intact  Cognition:  WNL  Sleep:  Number of Hours: 5     Treatment Plan Summary: Patient is still very dysphoric. Continues to express worthlessness and hopelessness. We plan  to adjust her medications further today.   Psychiatric: Bipolar Disorder BLPD  Medical: HTN  Psychosocial:  Poor social outlet  PLAN: 1. Increase Chlorpromazine to 200 mg HS 2. Ativan 1 mg stat 3. Continue to monitor mood, behavior and interaction with peers     Georgiann Cocker, MD 12/22/2016, 2:52 PMPatient ID: Danielle Cox, female   DOB: [birth year], 56 y.o.   MRN: [id number] Patient ID: Danielle Cox, female   DOB: 24-Apr-1960, 56 y.o.   MRN: [id number]

## 2016-12-22 NOTE — Progress Notes (Signed)
Pt reported to MHT that she had fallen earlier in hall and crawled back to her room.  AC JoAnn asked security to run back tape as Pt had not told me this information minutes before when I had been to see her.  Security informed AC and myself that Pt could be seen leaning out of her doorway looking out but did not appear to fall.  He stated I had gone down to her room almost immediately after she had looked out of doorway.  When I ddi go to her room she was standing in doorway and stated her legs felt shakey.  After Pt was advised to remain in bed MHT again observed Pt in doorway looking out.  Pt was again advised to lay down.  Will continue to closely monitor.

## 2016-12-23 MED ORDER — GABAPENTIN 400 MG PO CAPS
400.0000 mg | ORAL_CAPSULE | Freq: Two times a day (BID) | ORAL | Status: DC
  Administered 2016-12-23 – 2016-12-25 (×4): 400 mg via ORAL
  Filled 2016-12-23 (×6): qty 1

## 2016-12-23 NOTE — Progress Notes (Signed)
D)  Pt appears anxious, pacing the halls at times. States, "I am always this anxious. I have been this way for years. I am scared of what will happen. I am just so anxious I don't know what to do. I am worried about everything". Affect is tearful most of the time. Pt's paces the hall often and is unable to sit for any length of time. Pt rates her deprression, hopelessness and anxiety all at a 10. Denies SI and HI. A) Have had frequent 1:1's with Pt throughout the day. Have encouraged Pt. To relax and allow the medications to work. Have encouraged Pt to attend the groups R) Pt is a bit calmer this evening. Still remains anxious but a little less so than during the day today.

## 2016-12-23 NOTE — [facility] Group Notes (Signed)
[facility] LCSW Group Therapy Note  Date/Time:  12/23/2016 10:00-11:00AM  Type of Therapy and Topic:  Group Therapy:  Healthy and Unhealthy Supports  Participation Level:  None   Description of Group:  Patients in this group were introduced to the idea of adding a variety of healthy supports to address the various needs in their lives. Patients identified and discussed what additional healthy supports could be helpful in their recovery and wellness after discharge in order to prevent future hospitalizations.   An emphasis was placed on using counselor, doctor, therapy groups, 12-step groups, and problem-specific support groups to expand supports.   Therapeutic Goals:   1)  discuss importance of adding supports to stay well once out of the hospital  2)  compare healthy versus unhealthy supports and identify some examples of each  3)  generate ideas and descriptions of healthy supports that can be added  4)  offer mutual support about how to address unhealthy supports  5)  encourage active participation in and adherence to discharge plan    Summary of Patient Progress:  The patient was in and out of the room multiple times with anxiety and tears..   Therapeutic Modalities:   Motivational Interviewing Brief Solution-Focused Therapy  Danielle MantleMareida Grossman-Orr, LCSW 12/23/2016, 11:00AM

## 2016-12-23 NOTE — Progress Notes (Signed)
[facility]  12/23/2016 3:01 PM Danielle Cox  MRN:  [id number] Subjective:   56 y.o Caucasian female, married, lives with her family. Background history of Bipolar disorder and Borderline PD. Presented on account of suicidal behavior. Overdosed on 120 pills of Valium 5. Called her husband so she could talk to him before she dies. Reported to have had multiple admissions over the years. [facility]. Her husband works at a [employer].   Chart reviewed today. Patient discussed at team today. Her husband wants patient placed in a facility for mentally sick people. Feels that he is not at home and she cannot cope alone. We have agreed to a family session where we can review realistic options.  Staff reports that she remains anxious. Her new roommate has been supportive and encouraging for her. She attended a group session today  Seen today. She slept well last night. Says she still feels anxious around people. Was unnerved after the group today. Encouraged to use this setting to deal with the anxiety associated with being around people. Goal of interacting with other reinstated as that is the way to deal with her social isolation. Not expressing any violent thoughts. Tolerating er medication well. We agreed to increase Gabapentin a bit today.    Principal Problem: Major depressive disorder, recurrent severe without psychotic features (HCC) Diagnosis:   Patient Active Problem List   Diagnosis Date Noted  . Major depressive disorder, recurrent severe without psychotic features (HCC) [F33.2] 12/13/2016  . Overweight (BMI 25.0-29.9) [E66.3] 03/02/2016  . Personality disorder (HCC) [F60.9] 04/23/2013  . Bipolar 1 disorder, depressed (HCC) [F31.9] 09/29/2012  . Current every day smoker [F17.200] 02/17/2009  . Essential hypertension [I10] 02/17/2009   Total Time spent with patient: 30 minutes  Past Psychiatric History: As in  H&P  Past Medical History:  Past Medical History:  Diagnosis Date  . Anxiety   . Bipolar disorder (HCC)   . Bronchitis, chronic (HCC)   . Depression   . Personality disorder [facility](HCC)     Past Surgical History:  Procedure Laterality Date  . CESAREAN SECTION  1983, 1988, 1992  . TONSILLECTOMY  age 56   Family History:  Family History  Problem Relation Age of Onset  . Depression Mother   . Alcohol abuse Mother   . Depression Father   . Alcohol abuse Father   . Alcohol abuse Sister   . Depression Sister   . Alcohol abuse Brother   . Depression Brother    Family Psychiatric  History: As in H&P Social History:  Social History   Substance and Sexual Activity  Alcohol Use No     Social History   Substance and Sexual Activity  Drug Use No    Social History   Socioeconomic History  . Marital status: Married    Spouse name: None  . Number of children: None  . Years of education: None  . Highest education level: None  Social Needs  . Financial resource strain: None  . Food insecurity - worry: None  . Food insecurity - inability: None  . Transportation needs - medical: None  . Transportation needs - non-medical: None  Occupational History  . None  Tobacco Use  . Smoking status: Current Every Day Smoker    Packs/day: 1.00    Types: Cigarettes  . Smokeless tobacco: Never Used  Substance and Sexual Activity  . Alcohol use: No  . Drug use: No  .  Sexual activity: Yes    Partners: Male    Birth control/protection: None  Other Topics Concern  . None  Social History Narrative   10/29/2012 [name] Danielle Cox was born and grew up in [place], [place]. She has 2 brothers and one sister. She reports that her childhood was "poor," and explains that she needs both financially and emotionally. She completed the 10th grade, then achieved her GED. She has been married twice. The first time at age 89, and that ended after 1 year. She is currently married to her second husband of 27 years. She  has 2 sons and one daughter. She is currently unemployed and on disability for 2 years. She lives with her husband, her daughter, and one of her daughter's friends. She denies any legal difficulties. She reports that she is spiritual but not religious. Her hobbies include reading and cross stitch. She reports that she has no social support network. 10/29/2012 [name]   Additional Social History:     Sleep: Poor  Appetite:  Fair  Current Medications: Current Facility-Administered Medications  Medication Dose Route Frequency Provider Last Rate Last Dose  . alum & mag hydroxide-simeth (MAALOX/MYLANTA) 200-200-20 MG/5ML suspension 30 mL  30 mL Oral Q4H PRN Charm Rings, NP      . carbamazepine (TEGRETOL XR) 12 hr tablet 600 mg  600 mg Oral BID Georgiann Cocker, MD   600 mg at 12/23/16 0834  . chlorproMAZINE (THORAZINE) tablet 100 mg  100 mg Oral QHS PRN Georgiann Cocker, MD   100 mg at 12/22/16 2335  . chlorproMAZINE (THORAZINE) tablet 200 mg  200 mg Oral QHS Caio Devera, Delight Ovens, MD   200 mg at 12/22/16 2140  . cholecalciferol (VITAMIN D) tablet 1,000 Units  1,000 Units Oral Daily Charm Rings, NP   1,000 Units at 12/23/16 [phone]  . feeding supplement (ENSURE ENLIVE) (ENSURE ENLIVE) liquid 237 mL  237 mL Oral TID WC PRN Money, Gerlene Burdock, FNP   237 mL at 12/21/16 0905  . fluticasone (FLONASE) 50 MCG/ACT nasal spray 2 spray  2 spray Each Nare BID PRN Money, Gerlene Burdock, FNP   2 spray at 12/21/16 0843  . gabapentin (NEURONTIN) capsule 200 mg  200 mg Oral BID Money, Gerlene Burdock, FNP   200 mg at 12/23/16 0834  . gabapentin (NEURONTIN) capsule 400 mg  400 mg Oral QHS Ariyon Gerstenberger A, MD   400 mg at 12/22/16 2141  . hydrOXYzine (ATARAX/VISTARIL) tablet 50 mg  50 mg Oral Q6H PRN Cobos, Rockey Situ, MD   50 mg at 12/23/16 1135  . ibuprofen (ADVIL,MOTRIN) tablet 600 mg  600 mg Oral Q6H PRN Money, Gerlene Burdock, FNP   600 mg at 12/20/16 8295  . LORazepam (ATIVAN) tablet 1 mg  1 mg Oral Once Cresencio Reesor A,  MD      . magnesium hydroxide (MILK OF MAGNESIA) suspension 30 mL  30 mL Oral Daily PRN Charm Rings, NP   30 mL at 12/17/16 0810  . multivitamin with minerals tablet 1 tablet  1 tablet Oral Daily Cobos, Rockey Situ, MD   1 tablet at 12/23/16 0835  . nicotine (NICODERM CQ - dosed in mg/24 hours) patch 21 mg  21 mg Transdermal Daily Cobos, Rockey Situ, MD   21 mg at 12/23/16 0836  . thiamine (VITAMIN B-1) tablet 100 mg  100 mg Oral Daily Cobos, Rockey Situ, MD   100 mg at 12/23/16 6213    Lab Results: No results found for  this or any previous visit (from the past 48 hour(s)).  Blood Alcohol level:  Lab Results  Component Value Date   ETH <10 12/12/2016   ETH  08/03/2009    <5        LOWEST DETECTABLE LIMIT FOR SERUM ALCOHOL IS 5 mg/dL FOR MEDICAL PURPOSES ONLY    Metabolic Disorder Labs: Lab Results  Component Value Date   HGBA1C 5.6 12/15/2016   MPG 114 12/15/2016   No results found for: PROLACTIN Lab Results  Component Value Date   CHOL 301 (H) 12/15/2016   TRIG 270 (H) 12/15/2016   HDL 48 12/15/2016   CHOLHDL 6.3 12/15/2016   VLDL 54 (H) 12/15/2016   LDLCALC 199 (H) 12/15/2016   LDLCALC (H) 05/27/2008    149        Total Cholesterol/HDL:CHD Risk Coronary Heart Disease Risk Table                     Men   Women  1/2 Average Risk   3.4   3.3  Average Risk       5.0   4.4  2 X Average Risk   9.6   7.1  3 X Average Risk  23.4   11.0        Use the calculated Patient Ratio above and the CHD Risk Table to determine the patient's CHD Risk.        ATP III CLASSIFICATION (LDL):  <100     mg/dL   Optimal  161-096  mg/dL   Near or Above                    Optimal  130-159  mg/dL   Borderline  045-409  mg/dL   High  >811     mg/dL   Very High    Physical Findings: AIMS: Facial and Oral Movements Muscles of Facial Expression: None, normal Lips and Perioral Area: None, normal Jaw: None, normal Tongue: None, normal,Extremity Movements Upper (arms, wrists, hands,  fingers): None, normal Lower (legs, knees, ankles, toes): None, normal, Trunk Movements Neck, shoulders, hips: None, normal, Overall Severity Severity of abnormal movements (highest score from questions above): None, normal Incapacitation due to abnormal movements: None, normal Patient's awareness of abnormal movements (rate only patient's report): No Awareness, Dental Status Current problems with teeth and/or dentures?: No Does patient usually wear dentures?: No  CIWA:  CIWA-Ar Total: 8 COWS:  COWS Total Score: 2  Musculoskeletal: Strength & Muscle Tone: within normal limits Gait & Station: normal Patient leans: N/A  Psychiatric Specialty Exam: Physical Exam  Constitutional: No distress.  HENT:  Head: Normocephalic and atraumatic.  Neurological: She is alert.  Psychiatric:  As above    ROS  Blood pressure (!) 81/57, pulse (!) 110, temperature 98.1 F (36.7 C), temperature source Oral, resp. rate 16, height 5\' 3"  (1.6 m), weight 70.3 kg (155 lb), SpO2 100 %.Body mass index is 27.46 kg/m.  General Appearance:  Tense but relatively calmer today.   Eye Contact:  Moderate  Speech: Spontaneous, soft spoken.  Volume:  Normal  Mood:  Dysphoric and Irritable  Affect:  Congruent  Thought Process:  Linear  Orientation:  Full (Time, Place, and Person)  Thought Content:  Negative ruminations. Hopelessness and worthlessness.  No hallucination in any modality.   Suicidal Thoughts:  Stronger lately  Homicidal Thoughts:  No  Memory:  Unable to assess at this time.   Judgement:  Fair  Insight:  Good  Psychomotor Activity:  Normal  Concentration:  Fair  Recall:  Unable to assess at this time.   Fund of Knowledge:  Fair  Language:  Good  Akathisia:  Restless   Handed:    AIMS (if indicated):     Assets:  Desire for Improvement Housing Intimacy  ADL's:  Intact  Cognition:  WNL  Sleep:  Number of Hours: 6.25     Treatment Plan Summary: Patient is less dysphoric today.  Continues to express worthlessness and hopelessness. We plan to adjust her medications further today.   Psychiatric: Bipolar Disorder BLPD  Medical: HTN  Psychosocial:  Poor social outlet  PLAN: 1. Increase Gabapentin to 400 mg TID  2. Continue other medications at current dose 3. Continue to monitor mood, behavior and interaction with peers     Georgiann CockerVincent A Clora Ohmer, MD 12/23/2016, 3:01 PMPatient ID: Danielle MartinezSheila K Ghazarian, female   DOB: [birth year], 56 y.o.   MRN: [id number] Patient ID: Danielle Cox, female   DOB: [birth year], 56 y.o.   MRN: [id number] Patient ID: Danielle Cox, female   DOB: [birth year], 56 y.o.   MRN: [id number]

## 2016-12-23 NOTE — Progress Notes (Signed)
Patient ID: Danielle MartinezSheila K Piccininni, female   DOB: 1960-11-30, 56 y.o.   MRN: [id number]  DAR Note: Pt was pacing the hallway for most of the evening. Pt continue endorsed severe anxiety, depression, helplessness, hopelessness and was very tearful. "they are trying to send me home and I don't think I'm ready." Pt denied pain, SI, HI of AVH. . Support, encouragement, and safe environment provided.  15-minute safety checks continue. All Pt's questions and concerns addressed. Pt was med compliant. Pt did not attend wrap-up group.

## 2016-12-23 NOTE — [facility] Group Notes (Signed)
[facility] Group Notes:  Skills Group  Date:  12/23/2016  Time:  2:06 PM  Type of Therapy:  Psychoeducational Skills  Participation Level:  None  Participation Quality:  Inattentive  Affect:  Anxious  Cognitive:  Alert  Insight:  Limited  Engagement in Group:  Poor  Modes of Intervention:  Activity, Discussion and Education  Summary of Progress/Problems: Patient attended group however was seen up a couple of times. Patient left group early.   Garrin Kirwan E 12/23/2016, 2:06 PM

## 2016-12-24 DIAGNOSIS — M791 Myalgia, unspecified site: Secondary | ICD-10-CM

## 2016-12-24 NOTE — Progress Notes (Signed)
[facility]  12/24/2016 1:14 PM Danielle MartinezSheila K Kil  MRN:  [id number]   Subjective:  Danielle Cox reports " I am not doing to well today."    Objective: Danielle Cox is awake, alert and oriented. Seen resting in bedroom. Patient present flat and depressed. Patient is tearful throughout this assessment. Patient has concerns with her medications and mood. States " I don't feel like I am ever going to get better." Per nursing staff Patient has family meeting schedule for today.- meeting has been reschedule for 12/25/2016 with MD and Child psychotherapistsocial worker.    Patient reports he is medication compliant and states he is tolerating medications well.  Denies suicidal or homicidal ideation. Denies auditory or visual hallucination and does not appear to be responding to internal stimuli.  Patient presents with irritable and  Patient reports she is medication compliant without mediation side effects. Support, encouragement and reassurance was provided.     Principal Problem: Major depressive disorder, recurrent severe without psychotic features (HCC) Diagnosis:   Patient Active Problem List   Diagnosis Date Noted  . Major depressive disorder, recurrent severe without psychotic features (HCC) [F33.2] 12/13/2016  . Overweight (BMI 25.0-29.9) [E66.3] 03/02/2016  . Personality disorder (HCC) [F60.9] 04/23/2013  . Bipolar 1 disorder, depressed (HCC) [F31.9] 09/29/2012  . Current every day smoker [F17.200] 02/17/2009  . Essential hypertension [I10] 02/17/2009   Total Time spent with patient: 30 minutes  Past Psychiatric History: As in H&P  Past Medical History:  Past Medical History:  Diagnosis Date  . Anxiety   . Bipolar disorder (HCC)   . Bronchitis, chronic (HCC)   . Depression   . Personality disorder [facility](HCC)     Past Surgical History:  Procedure Laterality Date  . CESAREAN SECTION  1983, 1988, 1992  . TONSILLECTOMY  age 56   Family History:  Family History  Problem Relation Age of Onset  .  Depression Mother   . Alcohol abuse Mother   . Depression Father   . Alcohol abuse Father   . Alcohol abuse Sister   . Depression Sister   . Alcohol abuse Brother   . Depression Brother    Family Psychiatric  History: As in H&P Social History:  Social History   Substance and Sexual Activity  Alcohol Use No     Social History   Substance and Sexual Activity  Drug Use No    Social History   Socioeconomic History  . Marital status: Married    Spouse name: None  . Number of children: None  . Years of education: None  . Highest education level: None  Social Needs  . Financial resource strain: None  . Food insecurity - worry: None  . Food insecurity - inability: None  . Transportation needs - medical: None  . Transportation needs - non-medical: None  Occupational History  . None  Tobacco Use  . Smoking status: Current Every Day Smoker    Packs/day: 1.00    Types: Cigarettes  . Smokeless tobacco: Never Used  Substance and Sexual Activity  . Alcohol use: No  . Drug use: No  . Sexual activity: Yes    Partners: Male    Birth control/protection: None  Other Topics Concern  . None  Social History Narrative   10/29/2012 [name] Danielle Cox was born and [place]. She has 2 brothers and one sister. She reports that her childhood was "poor," and explains that she needs both financially and emotionally. She completed the 10th  grade, then achieved her GED. She has been married twice. The first time at age 56, and that ended after 1 year. She is currently married to her second husband of 27 years. She has 2 sons and one daughter. She is currently unemployed and on disability for 2 years. She lives with her husband, her daughter, and one of her daughter's friends. She denies any legal difficulties. She reports that she is spiritual but not religious. Her hobbies include reading and cross stitch. She reports that she has no social support network. 10/29/2012 [name]   Additional  Social History:     Sleep: Poor  Appetite:  Fair  Current Medications: Current Facility-Administered Medications  Medication Dose Route Frequency Provider Last Rate Last Dose  . alum & mag hydroxide-simeth (MAALOX/MYLANTA) 200-200-20 MG/5ML suspension 30 mL  30 mL Oral Q4H PRN Charm RingsLord, Jamison Y, NP      . carbamazepine (TEGRETOL XR) 12 hr tablet 600 mg  600 mg Oral BID Georgiann CockerIzediuno, Vincent A, MD   600 mg at 12/24/16 0828  . chlorproMAZINE (THORAZINE) tablet 100 mg  100 mg Oral QHS PRN Georgiann CockerIzediuno, Vincent A, MD   100 mg at 12/24/16 0228  . chlorproMAZINE (THORAZINE) tablet 200 mg  200 mg Oral QHS Izediuno, Delight OvensVincent A, MD   200 mg at 12/23/16 2136  . cholecalciferol (VITAMIN D) tablet 1,000 Units  1,000 Units Oral Daily Charm RingsLord, Jamison Y, NP   1,000 Units at 12/24/16 0827  . feeding supplement (ENSURE ENLIVE) (ENSURE ENLIVE) liquid 237 mL  237 mL Oral TID WC PRN Money, [name], FNP   237 mL at 12/24/16 1122  . fluticasone (FLONASE) 50 MCG/ACT nasal spray 2 spray  2 spray Each Nare BID PRN Money, [name], FNP   2 spray at 12/21/16 0843  . gabapentin (NEURONTIN) capsule 400 mg  400 mg Oral QHS Izediuno, Delight OvensVincent A, MD   400 mg at 12/23/16 2136  . gabapentin (NEURONTIN) capsule 400 mg  400 mg Oral BID Izediuno, Vincent A, MD   400 mg at 12/24/16 0827  . hydrOXYzine (ATARAX/VISTARIL) tablet 50 mg  50 mg Oral Q6H PRN Cobos, Rockey SituFernando A, MD   50 mg at 12/24/16 1125  . ibuprofen (ADVIL,MOTRIN) tablet 600 mg  600 mg Oral Q6H PRN Money, [name], FNP   600 mg at 12/20/16 [id number]  . LORazepam (ATIVAN) tablet 1 mg  1 mg Oral Once Izediuno, Vincent A, MD      . magnesium hydroxide (MILK OF MAGNESIA) suspension 30 mL  30 mL Oral Daily PRN Charm RingsLord, Jamison Y, NP   30 mL at 12/17/16 0810  . multivitamin with minerals tablet 1 tablet  1 tablet Oral Daily Cobos, Rockey SituFernando A, MD   1 tablet at 12/24/16 [phone]  . nicotine (NICODERM CQ - dosed in mg/24 hours) patch 21 mg  21 mg Transdermal Daily Cobos, Rockey SituFernando A, MD   21 mg at  12/24/16 0845  . thiamine (VITAMIN B-1) tablet 100 mg  100 mg Oral Daily Cobos, Rockey SituFernando A, MD   100 mg at 12/24/16 [id number]    Lab Results: No results found for this or any previous visit (from the past 48 hour(s)).  Blood Alcohol level:  Lab Results  Component Value Date   ETH <10 12/12/2016   [facility]  [facility]    <5        LOWEST DETECTABLE LIMIT FOR SERUM ALCOHOL IS 5 mg/dL FOR MEDICAL PURPOSES ONLY    Metabolic Disorder Labs: Lab Results  Component  Value Date   HGBA1C 5.6 12/15/2016   MPG 114 12/15/2016   No results found for: PROLACTIN Lab Results  Component Value Date   CHOL 301 (H) 12/15/2016   TRIG 270 (H) 12/15/2016   HDL 48 12/15/2016   CHOLHDL 6.3 12/15/2016   VLDL 54 (H) 12/15/2016   LDLCALC 199 (H) 12/15/2016   LDLCALC (H) 05/27/2008    149        Total Cholesterol/HDL:CHD Risk Coronary Heart Disease Risk Table                     Men   Women  1/2 Average Risk   3.4   3.3  Average Risk       5.0   4.4  2 X Average Risk   9.6   7.1  3 X Average Risk  23.4   11.0        Use the calculated Patient Ratio above and the CHD Risk Table to determine the patient's CHD Risk.        ATP III CLASSIFICATION (LDL):  <100     mg/dL   Optimal  161-096  mg/dL   Near or Above                    Optimal  130-159  mg/dL   Borderline  045-409  mg/dL   High  >811     mg/dL   Very High    Physical Findings: AIMS: Facial and Oral Movements Muscles of Facial Expression: None, normal Lips and Perioral Area: None, normal Jaw: None, normal Tongue: None, normal,Extremity Movements Upper (arms, wrists, hands, fingers): None, normal Lower (legs, knees, ankles, toes): None, normal, Trunk Movements Neck, shoulders, hips: None, normal, Overall Severity Severity of abnormal movements (highest score from questions above): None, normal Incapacitation due to abnormal movements: None, normal Patient's awareness of abnormal movements (rate only patient's report): No Awareness,  Dental Status Current problems with teeth and/or dentures?: No Does patient usually wear dentures?: No  CIWA:  CIWA-Ar Total: 2 COWS:  COWS Total Score: 3  Musculoskeletal: Strength & Muscle Tone: within normal limits Gait & Station: normal Patient leans: N/A  Psychiatric Specialty Exam: Physical Exam  Vitals reviewed. Constitutional: She appears well-developed and well-nourished.  HENT:  Head: Normocephalic and atraumatic.  Neurological: She is alert.  Psychiatric: She has a normal mood and affect. Her behavior is normal.  As above    Review of Systems  Musculoskeletal: Positive for myalgias.  Psychiatric/Behavioral: Positive for depression.  All other systems reviewed and are negative.   Blood pressure 128/72, pulse (!) 116, temperature 98.1 F (36.7 C), temperature source Oral, resp. rate 16, height 5\' 3"  (1.6 m), weight 70.3 kg (155 lb), SpO2 100 %.Body mass index is 27.46 kg/m.  General Appearance:  Casual and tearful   Eye Contact:  Moderate  Speech: Spontaneous, soft spoken.  Volume:  Normal  Mood:  Dysphoric and Irritable  Affect:  Congruent  Thought Process:  Linear  Orientation:  Full (Time, Place, and Person)  Thought Content:  Negative ruminations. Hopelessness and worthlessness.  No hallucination in any modality.   Suicidal Thoughts:  Stronger lately  Homicidal Thoughts:  No  Memory:  Unable to assess at this time.   Judgement:  Fair  Insight:  Good  Psychomotor Activity:  Normal  Concentration:  Fair  Recall:  Unable to assess at this time.   Fund of Knowledge:  Fair  Language:  Good  Akathisia:  Restless   Handed:    AIMS (if indicated):     Assets:  Desire for Improvement Housing Intimacy  ADL's:  Intact  Cognition:  WNL  Sleep:  Number of Hours: 4.75     Treatment Plan Summary: Daily contact with patient to assess and evaluate symptoms and progress in treatment and Medication management  Continue with current treatment plan on  12/24/2016  Psychiatric: Bipolar Disorder BLPD  Medical: HTN  Psychosocial:  Poor social outlet  PLAN: 1. Continue Gabapentin to 400 mg TID  2. Continue Tegretol 600 mg PO BID 3. Continue Thorazine 200 mg PO QHS  4. Continue other medications at current dose 5. Continue to monitor mood, behavior and interaction with peers  Will continue to monitor vitals ,medication compliance and treatment side effects while patient is here.  CSW will start working on disposition.  Patient to participate in therapeutic milieu    Oneta Rack, NP 12/24/2016, 1:14 PM

## 2016-12-24 NOTE — Tx Team (Signed)
Interdisciplinary Treatment and Diagnostic Plan Update  12/24/2016 Time of Session: 1438 Danielle MartinezSheila K Cox MRN: [id number]  Principal Diagnosis: Major depressive disorder, recurrent severe without psychotic features [facility](HCC)  Secondary Diagnoses: Principal Problem:   Major depressive disorder, recurrent severe without psychotic features (HCC)   Current Medications:  Current Facility-Administered Medications  Medication Dose Route Frequency Provider Last Rate Last Dose  . alum & mag hydroxide-simeth (MAALOX/MYLANTA) 200-200-20 MG/5ML suspension 30 mL  30 mL Oral Q4H PRN Charm RingsLord, Jamison Y, NP      . carbamazepine (TEGRETOL XR) 12 hr tablet 600 mg  600 mg Oral BID Georgiann CockerIzediuno, Vincent A, MD   600 mg at 12/24/16 0828  . chlorproMAZINE (THORAZINE) tablet 100 mg  100 mg Oral QHS PRN Georgiann CockerIzediuno, Vincent A, MD   100 mg at 12/24/16 0228  . chlorproMAZINE (THORAZINE) tablet 200 mg  200 mg Oral QHS Izediuno, Delight OvensVincent A, MD   200 mg at 12/23/16 2136  . cholecalciferol (VITAMIN D) tablet 1,000 Units  1,000 Units Oral Daily Charm RingsLord, Jamison Y, NP   1,000 Units at 12/24/16 0827  . feeding supplement (ENSURE ENLIVE) (ENSURE ENLIVE) liquid 237 mL  237 mL Oral TID WC PRN Money, [name], FNP   237 mL at 12/24/16 1122  . fluticasone (FLONASE) 50 MCG/ACT nasal spray 2 spray  2 spray Each Nare BID PRN Money, [name], FNP   2 spray at 12/21/16 0843  . gabapentin (NEURONTIN) capsule 400 mg  400 mg Oral QHS Izediuno, Delight OvensVincent A, MD   400 mg at 12/23/16 2136  . gabapentin (NEURONTIN) capsule 400 mg  400 mg Oral BID Izediuno, Vincent A, MD   400 mg at 12/24/16 0827  . hydrOXYzine (ATARAX/VISTARIL) tablet 50 mg  50 mg Oral Q6H PRN Cobos, Rockey SituFernando A, MD   50 mg at 12/24/16 1125  . ibuprofen (ADVIL,MOTRIN) tablet 600 mg  600 mg Oral Q6H PRN Money, [name], FNP   600 mg at 12/20/16 [id number]  . LORazepam (ATIVAN) tablet 1 mg  1 mg Oral Once Izediuno, Vincent A, MD      . magnesium hydroxide (MILK OF MAGNESIA) suspension 30 mL  30 mL Oral  Daily PRN Charm RingsLord, Jamison Y, NP   30 mL at 12/17/16 0810  . multivitamin with minerals tablet 1 tablet  1 tablet Oral Daily Cobos, Rockey SituFernando A, MD   1 tablet at 12/24/16 [phone]  . nicotine (NICODERM CQ - dosed in mg/24 hours) patch 21 mg  21 mg Transdermal Daily Cobos, Rockey SituFernando A, MD   21 mg at 12/24/16 0845  . thiamine (VITAMIN B-1) tablet 100 mg  100 mg Oral Daily Cobos, Rockey SituFernando A, MD   100 mg at 12/24/16 [id number]   PTA Medications: Medications Prior to Admission  Medication Sig Dispense Refill Last Dose  . QUEtiapine (SEROQUEL XR) 300 MG 24 hr tablet Take 300 mg by mouth at bedtime.    12/11/2016 at Unknown time    Patient Stressors: Financial difficulties Traumatic event Other: chronic depression  Patient Strengths: General fund of knowledge Physical Health Supportive family/friends  Treatment Modalities: Medication Management, Group therapy, Case management,  1 to 1 session with clinician, Psychoeducation, Recreational therapy.   Physician Treatment Plan for Primary Diagnosis: Major depressive disorder, recurrent severe without psychotic features (HCC) Long Term Goal(s): Improvement in symptoms so as ready for discharge Improvement in symptoms so as ready for discharge   Short Term Goals: Ability to identify changes in lifestyle to reduce recurrence of condition will improve Ability to maintain  clinical measurements within normal limits will improve Ability to verbalize feelings will improve Ability to disclose and discuss suicidal ideas Ability to demonstrate self-control will improve Ability to identify and develop effective coping behaviors will improve Ability to maintain clinical measurements within normal limits will improve  Medication Management: Evaluate patient's response, side effects, and tolerance of medication regimen.  Therapeutic Interventions: 1 to 1 sessions, Unit Group sessions and Medication administration.  Evaluation of Outcomes: Progressing  Physician  Treatment Plan for Secondary Diagnosis: Principal Problem:   Major depressive disorder, recurrent severe without psychotic features (HCC)  Long Term Goal(s): Improvement in symptoms so as ready for discharge Improvement in symptoms so as ready for discharge   Short Term Goals: Ability to identify changes in lifestyle to reduce recurrence of condition will improve Ability to maintain clinical measurements within normal limits will improve Ability to verbalize feelings will improve Ability to disclose and discuss suicidal ideas Ability to demonstrate self-control will improve Ability to identify and develop effective coping behaviors will improve Ability to maintain clinical measurements within normal limits will improve     Medication Management: Evaluate patient's response, side effects, and tolerance of medication regimen.  Therapeutic Interventions: 1 to 1 sessions, Unit Group sessions and Medication administration.  Evaluation of Outcomes: Progressing   RN Treatment Plan for Primary Diagnosis: Major depressive disorder, recurrent severe without psychotic features (HCC) Long Term Goal(s): Knowledge of disease and therapeutic regimen to maintain health will improve  Short Term Goals: Ability to identify and develop effective coping behaviors will improve and Compliance with prescribed medications will improve  Medication Management: RN will administer medications as ordered by provider, will assess and evaluate patient's response and provide education to patient for prescribed medication. RN will report any adverse and/or side effects to prescribing provider.  Therapeutic Interventions: 1 on 1 counseling sessions, Psychoeducation, Medication administration, Evaluate responses to treatment, Monitor vital signs and CBGs as ordered, Perform/monitor CIWA, COWS, AIMS and Fall Risk screenings as ordered, Perform wound care treatments as ordered.  Evaluation of Outcomes: Progressing   LCSW  Treatment Plan for Primary Diagnosis: Major depressive disorder, recurrent severe without psychotic features (HCC) Long Term Goal(s): Safe transition to appropriate next level of care at discharge, Engage patient in therapeutic group addressing interpersonal concerns.  Short Term Goals: Engage patient in aftercare planning with referrals and resources, Increase social support and Increase skills for wellness and recovery  Therapeutic Interventions: Assess for all discharge needs, 1 to 1 time with Social worker, Explore available resources and support systems, Assess for adequacy in community support network, Educate family and significant other(s) on suicide prevention, Complete Psychosocial Assessment, Interpersonal group therapy.  Evaluation of Outcomes: Progressing   Progress in Treatment: Attending groups: Yes. Participating in groups: No. Taking medication as prescribed: Yes. Toleration medication: Yes. Family/Significant other contact made: Yes, individual(s) contacted:  husband Patient understands diagnosis: Yes. Discussing patient identified problems/goals with staff: Yes. Medical problems stabilized or resolved: Yes. Denies suicidal/homicidal ideation: Yes. Issues/concerns per patient self-inventory: No. Other: none  New problem(s) identified: No, Describe:  none  New Short Term/Long Term Goal(s):  Discharge Plan or Barriers:   Reason for Continuation of Hospitalization: Depression Medication stabilization  Estimated Length of Stay:12/29/16  Attendees: Patient: 12/24/2016   Physician: Dr Jama Flavorsobos 12/24/2016   Nursing: Joslyn Devonaroline Beaudry, RN 12/24/2016   RN Care Manager: 12/24/2016   Social Worker: Daleen SquibbGreg Ellerie Arenz, LCSW 12/24/2016   Recreational Therapist:  12/24/2016   Other:  12/24/2016   Other:  12/24/2016   Other:  12/24/2016        Scribe for Treatment Team: Lorri Frederick, LCSW 12/24/2016 2:38 PM

## 2016-12-24 NOTE — Progress Notes (Signed)
Patient ID: Danielle MartinezSheila K Tejera, female   DOB: 07/01/1960, 56 y.o.   MRN: [id number]  DAR Note: Pt was a little calmer than usual tonight; "I think the medicine they put me on are working better form me." Pt however, continue endorsed moderate anxiety and depression. Pt denied pain, SI, HI of AVH. . Support, encouragement, and safe environment provided.  15-minute safety checks continue. All Pt's questions and concerns addressed. Pt was med compliant. Pt did not attend wrap-up group.

## 2016-12-24 NOTE — Progress Notes (Signed)
D:  Patient's self inventory sheet, patient sleeps poor, sleep medication not helpful.  Poor appetite, low energy level, poor concentration.  Rated depression, hopeless and anxiety 10.  Denied withdrawals.  Denied SI.  Physical problems, lightheaded, dizzy, rash.  Denied physical pain.  No discharge plans. A:  Medications administered per MD Orders.  Emotional support and encouragement given patient. R:  Patient denied SI and HI, contracts for safety.  Denied A/V hallucinations.  Safety maintained with 15 minute checks.

## 2016-12-24 NOTE — Plan of Care (Signed)
Nurse discussed depression, anxiety, coping skills with patient.  

## 2016-12-24 NOTE — Progress Notes (Signed)
The patient shared in group that she had a tough day because she is still having difficulty sitting still. She also expressed that she had a difficult time trying to convey to her doctor that she is having difficulty sitting still, but the physician did not address the issue. She gave credit to the nurse for walking with her and allowing her to vent. Her wellness strategy (theme of the day) will be to try to cope with her anxiety.

## 2016-12-24 NOTE — Progress Notes (Signed)
[facility] Group Notes:  (Nursing/MHT/Case Management/Adjunct)  Date:  12/24/2016  Time:  [id number]  Nursing Group  Patient invited to attend group stated "I can't sit still for a group."  Did not attend.

## 2016-12-24 NOTE — Progress Notes (Signed)
Patient's husband Onalee HuaDavid phone [phone] visited patient tonight.  Husband is very concerned about his wife and where she can receive treatment after discharge.  Husband stated he has to work, that he worries about his wife, that she stays by herself, and how [facility] will act [facility].  Husband stated he will be working tomorrow but he can be reached by phone.

## 2016-12-25 MED ORDER — GABAPENTIN 100 MG PO CAPS
100.0000 mg | ORAL_CAPSULE | Freq: Three times a day (TID) | ORAL | Status: DC
  Administered 2016-12-25 – 2016-12-27 (×7): 100 mg via ORAL
  Filled 2016-12-25 (×13): qty 1

## 2016-12-25 MED ORDER — CLONAZEPAM 1 MG PO TABS
1.0000 mg | ORAL_TABLET | Freq: Two times a day (BID) | ORAL | Status: DC
  Administered 2016-12-25 – 2016-12-28 (×7): 1 mg via ORAL
  Filled 2016-12-25 (×7): qty 1

## 2016-12-25 NOTE — Progress Notes (Addendum)
[facility] MD Progress Note  12/25/2016 2:30 PM Danielle Cox  MRN:  [id number]   Subjective:  Patient reports she feels severely anxious. Also endorses depression, but states anxiety is " worse ". Denies medication side effects but does not think that medications are helping at this time.   Objective:   I have discussed case with treatment team and have met with patient .  We also had family meeting with patient's husband, patient, CSW, Probation officer ( x 40 minutes ). Patient endorses ongoing anxiety which she describes as severe. She states she feels restless and describes a subjective sense of agitation. Husband reports concern that patient remains very anxious, depressed, and that improvement has been modest thus far . He worries about her ability to function at home , particularly as he is not at home all the time due to his employment  We reviewed psychiatric history- husband corroborates history of chronic mental illness, which started about 30 years ago. She has been diagnosed with Bipolar Disorder and does describe at least one episode of mania /psychosis several years ago, but major symptom has been depression and anxiety, without psychosis.   Has been on multiple medications over the years, including multiple ECT treatments , but no clear particularly effective medication has emerged, with the exception of Klonopin, which he does remember as helpful. Both patient and husband expressed that although her illness has been persistent her current status is below her baseline functioning .  Husband has noticed worsening involuntary movements affecting feet , mouth, and a guttural grunting type vocalization .  Patient states she does not like current medications as she feels " like a zombie", and feels they are causing her to feel more anxious. She does present with psychomotor restlessness, often standing up and pacing during session , suggestive of akathisia, and presents with some involuntary tongue/ oral   movements at times.     Principal Problem: Major depressive disorder, recurrent severe without psychotic features ([place]) Diagnosis:   Patient Active Problem List   Diagnosis Date Noted  . Major depressive disorder, recurrent severe without psychotic features ([place]) [F33.2] 12/13/2016  . Overweight (BMI 25.0-29.9) [E66.3] 03/02/2016  . Personality disorder ([place]) [F60.9] 04/23/2013  . Bipolar 1 disorder, depressed ([place]) [F31.9] 09/29/2012  . Current every day smoker [F17.200] 02/17/2009  . Essential hypertension [I10] 02/17/2009   Total Time spent with patient: 45 minutes - more than 50 % of session spent on counseling and disposition planning options   Past Psychiatric History: As in H&P  Past Medical History:  Past Medical History:  Diagnosis Date  . Anxiety   . Bipolar disorder ([place])   . Bronchitis, chronic ([place])   . Depression   . Personality disorder [facility])     Past Surgical History:  Procedure Laterality Date  . [place]  . TONSILLECTOMY  age 6   Family History:  Family History  Problem Relation Age of Onset  . Depression Mother   . Alcohol abuse Mother   . Depression Father   . Alcohol abuse Father   . Alcohol abuse Sister   . Depression Sister   . Alcohol abuse Brother   . Depression Brother    Family Psychiatric  History: As in H&P Social History:  Social History   Substance and Sexual Activity  Alcohol Use No     Social History   Substance and Sexual Activity  Drug Use No    Social History   Socioeconomic History  .  Marital status: Married    Spouse name: None  . Number of children: None  . Years of education: None  . Highest education level: None  Social Needs  . Financial resource strain: None  . Food insecurity - worry: None  . Food insecurity - inability: None  . Transportation needs - medical: None  . Transportation needs - non-medical: None  Occupational History  . None  Tobacco Use  . Smoking status:  Current Every Day Smoker    Packs/day: 1.00    Types: Cigarettes  . Smokeless tobacco: Never Used  Substance and Sexual Activity  . Alcohol use: No  . Drug use: No  . Sexual activity: Yes    Partners: Male    Birth control/protection: None  Other Topics Concern  . None  Social History Narrative   10/29/2012 Danielle Cox was born and grew up in [place], [place]. She has 2 brothers and one sister. She reports that her childhood was "poor," and explains that she needs both financially and emotionally. She completed the 10th grade, then achieved her GED. She has been married twice. The first time at age 73, and that ended after 1 year. She is currently married to her second husband of 27 years. She has 2 sons and one daughter. She is currently unemployed and on disability for 2 years. She lives with her husband, her daughter, and one of her daughter's friends. She denies any legal difficulties. She reports that she is spiritual but not religious. Her hobbies include reading and cross stitch. She reports that she has no social support network. 10/29/2012 Danielle   Additional Social History:     Sleep: Poor  Appetite:  Fair  Current Medications: Current Facility-Administered Medications  Medication Dose Route Frequency Provider Last Rate Last Dose  . alum & mag hydroxide-simeth (MAALOX/MYLANTA) 200-200-20 MG/5ML suspension 30 mL  30 mL Oral Q4H PRN Patrecia Pour, NP      . carbamazepine (TEGRETOL XR) 12 hr tablet 600 mg  600 mg Oral BID Artist Beach, MD   600 mg at 12/25/16 0750  . cholecalciferol (VITAMIN D) tablet 1,000 Units  1,000 Units Oral Daily Patrecia Pour, NP   1,000 Units at 12/25/16 0750  . clonazePAM (KLONOPIN) tablet 1 mg  1 mg Oral BID Tenleigh Byer A, MD      . feeding supplement (ENSURE ENLIVE) (ENSURE ENLIVE) liquid 237 mL  237 mL Oral TID WC PRN Money, Lowry Ram, FNP   237 mL at 12/24/16 1122  . fluticasone (FLONASE) 50 MCG/ACT nasal spray 2 spray  2 spray Each Nare BID  PRN Money, Lowry Ram, FNP   2 spray at 12/21/16 0843  . gabapentin (NEURONTIN) capsule 100 mg  100 mg Oral TID Adream Parzych, Myer Peer, MD   100 mg at 12/25/16 1342  . hydrOXYzine (ATARAX/VISTARIL) tablet 50 mg  50 mg Oral Q6H PRN Siddhi Dornbush, Myer Peer, MD   50 mg at 12/24/16 2333  . ibuprofen (ADVIL,MOTRIN) tablet 600 mg  600 mg Oral Q6H PRN Money, Lowry Ram, FNP   600 mg at 12/20/16 9935  . magnesium hydroxide (MILK OF MAGNESIA) suspension 30 mL  30 mL Oral Daily PRN Patrecia Pour, NP   30 mL at 12/17/16 0810  . multivitamin with minerals tablet 1 tablet  1 tablet Oral Daily Laysa Kimmey, Myer Peer, MD   1 tablet at 12/25/16 0750  . nicotine (NICODERM CQ - dosed in mg/24 hours) patch 21 mg  21 mg Transdermal Daily  Zebulan Hinshaw, Myer Peer, MD   21 mg at 12/25/16 9604  . thiamine (VITAMIN B-1) tablet 100 mg  100 mg Oral Daily Delayza Lungren, Myer Peer, MD   100 mg at 12/25/16 0750    Lab Results: No results found for this or any previous visit (from the past 48 hour(s)).  Blood Alcohol level:  Lab Results  Component Value Date   ETH <10 12/12/2016   ETH  08/03/2009    <5        LOWEST DETECTABLE LIMIT FOR SERUM ALCOHOL IS 5 mg/dL FOR MEDICAL PURPOSES ONLY    Metabolic Disorder Labs: Lab Results  Component Value Date   HGBA1C 5.6 12/15/2016   MPG 114 12/15/2016   No results found for: PROLACTIN Lab Results  Component Value Date   CHOL 301 (H) 12/15/2016   TRIG 270 (H) 12/15/2016   HDL 48 12/15/2016   CHOLHDL 6.3 12/15/2016   VLDL 54 (H) 12/15/2016   LDLCALC 199 (H) 12/15/2016   LDLCALC (H) 05/27/2008    149        Total Cholesterol/HDL:CHD Risk Coronary Heart Disease Risk Table                     Men   Women  1/2 Average Risk   3.4   3.3  Average Risk       5.0   4.4  2 X Average Risk   9.6   7.1  3 X Average Risk  23.4   11.0        Use the calculated Patient Ratio above and the CHD Risk Table to determine the patient's CHD Risk.        ATP III CLASSIFICATION (LDL):  <100     mg/dL    Optimal  100-129  mg/dL   Near or Above                    Optimal  130-159  mg/dL   Borderline  160-189  mg/dL   High  >190     mg/dL   Very High    Physical Findings: AIMS: Facial and Oral Movements Muscles of Facial Expression: None, normal Lips and Perioral Area: Minimal Jaw: None, normal Tongue: Minimal,Extremity Movements Upper (arms, wrists, hands, fingers): None, normal Lower (legs, knees, ankles, toes): None, normal, Trunk Movements Neck, shoulders, hips: None, normal, Overall Severity Severity of abnormal movements (highest score from questions above): Minimal Incapacitation due to abnormal movements: None, normal Patient's awareness of abnormal movements (rate only patient's report): Aware, no distress, Dental Status Current problems with teeth and/or dentures?: No Does patient usually wear dentures?: No  CIWA:  CIWA-Ar Total: 2 COWS:  COWS Total Score: 3  Musculoskeletal: Strength & Muscle Tone: within normal limits Gait & Station: normal Patient leans: N/A  Psychiatric Specialty Exam: Physical Exam  Vitals reviewed. Constitutional: She appears well-developed and well-nourished.  HENT:  Head: Normocephalic and atraumatic.  Neurological: She is alert.  Psychiatric: She has a normal mood and affect. Her behavior is normal.  As above    Review of Systems  Musculoskeletal: Positive for myalgias.  Psychiatric/Behavioral: Positive for depression.  All other systems reviewed and are negative. denies chest pain, no shortness of breath, no vomiting  Blood pressure 107/72, pulse (!) 115, temperature 97.8 F (36.6 C), temperature source Oral, resp. rate 20, height '5\' 3"'$  (1.6 m), weight 70.3 kg (155 lb), SpO2 100 %.Body mass index is 27.46 kg/m.  General Appearance:  Fairly groomed  Eye Contact:  Fair   Speech: normal   Volume:  Normal  Mood:  Anxious and Depressed  Affect:  anxious, tearful at times   Thought Process:  Linear and Descriptions of Associations:  Intact  Orientation:  Full (Time, Place, and Person)  Thought Content: denies hallucinations, no delusions, not internally preoccupied   Suicidal Thoughts:  Today denies suicidal plan or intention, contracts for safety on unit, denies homicidal ideations  Homicidal Thoughts:  No  Memory: recent and remote fair   Judgement:  Fair  Insight:  Fair  Psychomotor Activity:  Restlessness  Concentration:  Fair  Recall:  Unable to assess at this time.   Fund of Knowledge:  Fair  Language:  Good  Akathisia:  Restless   Handed:    AIMS (if indicated):     Assets:  Desire for Improvement Housing Intimacy  ADL's:  Intact  Cognition:  WNL  Sleep:  Number of Hours: 3.5    Assessment- patient presents with depression, affective lability/frequent crying and severe anxiety. She presents psycho-motorically restless , often pacing . Husband corroborates chronic mental illness ( Bipolar Disorder) but feels she is below her baseline and worries she would be unable to function at home in her present condition. Pacing, restlessness , anxiety may indicate akathisia, and presents with oral/buccal tongue movements which may be consistent with TD . As patient not currently presenting with active psychosis, and current symptoms are severe anxiety,depression rather than psychosis or paranoia, will D/C Thorazine at this time, and monitor restlessness , agitation. Will start Klonopin, which has been effective in the past, without side effects ( patient denies history of substance abuse ). Husband is expressing interest in group home or assisted living referral if a possible option.   PLAN: Encourage group and milieu participation to work on coping skills and symptom reduction 1. Start Klonopin 1 mgr BID for anxiety, akathisia 2. Continue Tegretol 600 mg PO BID- recheck Carbamazepine serum level in AM 3. D/C Thorazine- see rationale above  4. Decrease Neurontin to 100 mgrs TID ( patient reports she feels it is  poorly tolerated at current dose - titrate gradually as tolerated )  5. Treatment team working on disposition planning options 6. Patient will benefit from outpatient neurology/movement disorders clinic  evaluation- we discussed this recommendation with patient and husband .      Jenne Campus, MD 12/25/2016, 2:30 PM    Patient ID: Benn Moulder, female   DOB: Aug 28, 1960, 56 y.o.   MRN: [id number]

## 2016-12-25 NOTE — Progress Notes (Signed)
D: After laying down for approximately 15 minutes, the patient is walking back and forth in the hallway. She states that she is unable to sleep.   A: Encouraged the patient to return to her bed.   C: Patient continues to walk in the hallway.

## 2016-12-25 NOTE — [facility] Group Notes (Signed)
Adult Psychoeducational Group Note  Date:  12/25/2016 Time:  [id number]  Psychoeducational nursing group Today's group RN checked in with each patient and asked them how they are in their recovery, today's goal, and something they would like to learn about.  Afterwards RN would discuss topic patients selected briefly (about 2-5 minutes each topic) and did Q and A.  Patient attended most of group, got very tearful when RN asked her if she would like to check in stating "I don't know what am I supposed to say!"  Stated "I feel really anxious today" "worse than when I came in"  RN gave patient praise for attending group despite feeling bad, and invited her to leave after she checked in as she was shaking, tearful, and had very anxious affect.

## 2016-12-25 NOTE — Progress Notes (Signed)
D: Pt was in the hallway upon initial approach.  Pt presents with anxious, depressed affect and mood.  Her goal is to "sleep better."  She reports her day was "better, they put me on a better medicine and I was able to go to dinner and eat dinner."  Pt denies SI/HI, denies hallucinations, complains of chronic back pain of 8/10.  Pt has been visible in milieu interacting with peers and staff appropriately.  Pt attended evening group.     A: Introduced self to pt.  Actively listened to pt and provided support and encouragement. PRN medication administered for anxiety and pain.  Heat packs offered for pain, pt declined.  Q15 minute safety checks maintained.  R: Pt is safe on the unit.  Pt is compliant with medications.  Pt verbally contracts for safety.  Will continue to monitor and assess.

## 2016-12-25 NOTE — [facility] Group Notes (Signed)
Pt attended spiritual care group on grief and loss facilitated by chaplain Burnis KingfisherMatthew Namon Villarin   Group opened with brief discussion and psycho-social ed around grief and loss in relationships and in relation to self - identifying life patterns, circumstances, changes that cause losses. Established group norm of speaking from own life experience. Group goal of establishing open and affirming space for members to share loss and experience with grief, normalize grief experience and provide psycho social education and grief support. Group discussion drew from Worden's four tasks of mourning.  Facilitation drew on Narrative, and Adlerian frameworks.    [name] arrived at group during introductions.  She remained throughout group.  Did not engage in group discussion.  Had inward and anxious body language and periodically tearful.    [facility] / [facility] Chaplain Burnis KingfisherMatthew Cylas Falzone, MDiv

## 2016-12-25 NOTE — Progress Notes (Signed)
D: Patient is visible in the milieu.  She remains anxious, restless and is pacing the hallway.  Patient does not believe that she is improving stating "I don't know what to do."  Patient had poor sleep last night and her appetite is poor.  She rates her energy level as low, however, she continuously paces the hall.  She denies any thoughts of self harm and does not appear to be responding to internal stimuli.  She complains of lightheadedness, dizziness and headaches.  She did attend group this morning and ruminated on how terrible she feels.  She rates her depression, hopelessness and anxiety as a 10.  A: Continue to monitor medication management and MD orders.  Safety checks continued every 15 minutes per protocol.  Offer support and encouragement as needed.  R: Patient needs redirection at times.  She has been encouraged to use her coping skills and deep breathing techniques.

## 2016-12-25 NOTE — Progress Notes (Signed)
D:  [name] is up and visible on the unit.  She continues to be tearful, fearful, anxious and depressed.  She keeps reporting that her depression isn't getting better and "I don't think that I am ever going to get better."  She continues to report feeling anxious all the time and "nothing is helping, I can't sit still."  She is noted pacing the floors in between laying down in bed but unable to fall asleep.  She denies any SI/HI or A/V hallucinations.  She continues to report that her memory is poor.  She was able to talk about her family and how well they are doing in life but when hen you ask her how she is doing, she bursts out crying immediately.  She settles down fairly easily when talking with her.   A:  1:1 with RN for support and encouragement.  Medications as ordered and prn.  Encouraged continued participation in group and unit activities.  Q 15 minute checks maintained for safety.  Offered many suggestions as ice pack, heat packs and ear plugs.  She did accept the heat for over her eyes but that was ineffective and she declined any other interventions.   R:  She remains safe on the unit.  We will continue to monitor the progress towards her goals.

## 2016-12-26 DIAGNOSIS — G4700 Insomnia, unspecified: Secondary | ICD-10-CM

## 2016-12-26 LAB — CARBAMAZEPINE LEVEL, TOTAL: Carbamazepine Lvl: 9.7 ug/mL (ref 4.0–12.0)

## 2016-12-26 MED ORDER — RAMELTEON 8 MG PO TABS
8.0000 mg | ORAL_TABLET | Freq: Every day | ORAL | Status: DC
  Administered 2016-12-26 – 2016-12-27 (×2): 8 mg via ORAL
  Filled 2016-12-26 (×4): qty 1

## 2016-12-26 NOTE — Progress Notes (Signed)
[facility] MD Progress Note  12/26/2016 2:52 PM Danielle Cox  MRN:  [id number]   Subjective:  Patient reports that she feels like she needs to change today and stop putting so much on her husband. She states that she wants to go to [facility] and then IOP and she is working on arranging transportation. She denies any SI/HI/AVh and contracts for safety. She reports having difficulty sleeping last night.  Objective: Patient's chart and findings reviewed and discussed with treatment team. Patient presents lying in her bed. She becomes tearful quickly, but hen stops and is able to discuss future plans for treatment. Due to complaint of sleep will start Rozerem 8 mg QHS. CSW is working on appointment for PHP and IOP.  Principal Problem: Major depressive disorder, recurrent severe without psychotic features (HCC) Diagnosis:   Patient Active Problem List   Diagnosis Date Noted  . Major depressive disorder, recurrent severe without psychotic features (HCC) [F33.2] 12/13/2016  . Overweight (BMI 25.0-29.9) [E66.3] 03/02/2016  . Personality disorder (HCC) [F60.9] 04/23/2013  . Bipolar 1 disorder, depressed (HCC) [F31.9] 09/29/2012  . Current every day smoker [F17.200] 02/17/2009  . Essential hypertension [I10] 02/17/2009   Total Time spent with patient: 25 minutes  Past Psychiatric History: See H&P  Past Medical History:  Past Medical History:  Diagnosis Date  . Anxiety   . Bipolar disorder (HCC)   . Bronchitis, chronic (HCC)   . Depression   . Personality disorder [facility])     Past Surgical History:  Procedure Laterality Date  . CESAREAN SECTION  1983, 1988, 1992  . TONSILLECTOMY  age 87   Family History:  Family History  Problem Relation Age of Onset  . Depression Mother   . Alcohol abuse Mother   . Depression Father   . Alcohol abuse Father   . Alcohol abuse Sister   . Depression Sister   . Alcohol abuse Brother   . Depression Brother    Family Psychiatric  History: See H&P Social  History:  Social History   Substance and Sexual Activity  Alcohol Use No     Social History   Substance and Sexual Activity  Drug Use No    Social History   Socioeconomic History  . Marital status: Married    Spouse name: None  . Number of children: None  . Years of education: None  . Highest education level: None  Social Needs  . Financial resource strain: None  . Food insecurity - worry: None  . Food insecurity - inability: None  . Transportation needs - medical: None  . Transportation needs - non-medical: None  Occupational History  . None  Tobacco Use  . Smoking status: Current Every Day Smoker    Packs/day: 1.00    Types: Cigarettes  . Smokeless tobacco: Never Used  Substance and Sexual Activity  . Alcohol use: No  . Drug use: No  . Sexual activity: Yes    Partners: Male    Birth control/protection: None  Other Topics Concern  . None  Social History Narrative   10/29/2012 [name] Danielle Cox was born and grew up in [place], [place]. She has 2 brothers and one sister. She reports that her childhood was "poor," and explains that she needs both financially and emotionally. She completed the 10th grade, then achieved her GED. She has been married twice. The first time at age 37, and that ended after 1 year. She is currently married to her second husband of 27 years. She has 2 sons  and one daughter. She is currently unemployed and on disability for 2 years. She lives with her husband, her daughter, and one of her daughter's friends. She denies any legal difficulties. She reports that she is spiritual but not religious. Her hobbies include reading and cross stitch. She reports that she has no social support network. 10/29/2012 [name]   Additional Social History:                         Sleep: Good  Appetite:  Good  Current Medications: Current Facility-Administered Medications  Medication Dose Route Frequency Provider Last Rate Last Dose  . alum & mag hydroxide-simeth  (MAALOX/MYLANTA) 200-200-20 MG/5ML suspension 30 mL  30 mL Oral Q4H PRN Charm RingsLord, Jamison Y, NP   30 mL at 12/26/16 0338  . carbamazepine (TEGRETOL XR) 12 hr tablet 600 mg  600 mg Oral BID Georgiann CockerIzediuno, Vincent A, MD   600 mg at 12/26/16 0742  . cholecalciferol (VITAMIN D) tablet 1,000 Units  1,000 Units Oral Daily Charm RingsLord, Jamison Y, NP   1,000 Units at 12/26/16 [phone]  . clonazePAM (KLONOPIN) tablet 1 mg  1 mg Oral BID Atlas Kuc, Rockey SituFernando A, MD   1 mg at 12/26/16 0752  . feeding supplement (ENSURE ENLIVE) (ENSURE ENLIVE) liquid 237 mL  237 mL Oral TID WC PRN Money, [name], FNP   237 mL at 12/24/16 1122  . fluticasone (FLONASE) 50 MCG/ACT nasal spray 2 spray  2 spray Each Nare BID PRN Money, [name], FNP   2 spray at 12/26/16 0745  . gabapentin (NEURONTIN) capsule 100 mg  100 mg Oral TID Elowyn Raupp, Rockey SituFernando A, MD   100 mg at 12/26/16 1202  . hydrOXYzine (ATARAX/VISTARIL) tablet 50 mg  50 mg Oral Q6H PRN Laveyah Oriol, Rockey SituFernando A, MD   50 mg at 12/26/16 0427  . ibuprofen (ADVIL,MOTRIN) tablet 600 mg  600 mg Oral Q6H PRN Money, [name], FNP   600 mg at 12/26/16 0944  . magnesium hydroxide (MILK OF MAGNESIA) suspension 30 mL  30 mL Oral Daily PRN Charm RingsLord, Jamison Y, NP   30 mL at 12/17/16 0810  . multivitamin with minerals tablet 1 tablet  1 tablet Oral Daily Kriston Pasquarello, Rockey SituFernando A, MD   1 tablet at 12/26/16 0743  . nicotine (NICODERM CQ - dosed in mg/24 hours) patch 21 mg  21 mg Transdermal Daily Tajuan Dufault, Rockey SituFernando A, MD   21 mg at 12/26/16 0743  . ramelteon (ROZEREM) tablet 8 mg  8 mg Oral QHS Money, Travis B, FNP      . thiamine (VITAMIN B-1) tablet 100 mg  100 mg Oral Daily Nesanel Aguila, Rockey SituFernando A, MD   100 mg at 12/26/16 [id number]    Lab Results:  Results for orders placed or performed during the hospital encounter of 12/13/16 (from the past 48 hour(s))  Carbamazepine level, total     Status: None   Collection Time: 12/26/16  6:32 AM  Result Value Ref Range   Carbamazepine Lvl 9.7 4.0 - 12.0 ug/mL    Comment: Performed at [facility], [address] [place], [place] [address]    Blood Alcohol level:  Lab Results  Component Value Date   ETH <10 12/12/2016   [facility]    <5        LOWEST DETECTABLE LIMIT FOR SERUM ALCOHOL IS 5 mg/dL FOR MEDICAL PURPOSES ONLY    Metabolic Disorder Labs: Lab Results  Component Value Date   HGBA1C 5.6  12/15/2016   MPG 114 12/15/2016   No results found for: PROLACTIN Lab Results  Component Value Date   CHOL 301 (H) 12/15/2016   TRIG 270 (H) 12/15/2016   HDL 48 12/15/2016   CHOLHDL 6.3 12/15/2016   VLDL 54 (H) 12/15/2016   LDLCALC 199 (H) 12/15/2016   LDLCALC (H) 05/27/2008    149        Total Cholesterol/HDL:CHD Risk Coronary Heart Disease Risk Table                     Men   Women  1/2 Average Risk   3.4   3.3  Average Risk       5.0   4.4  2 X Average Risk   9.6   7.1  3 X Average Risk  23.4   11.0        Use the calculated Patient Ratio above and the CHD Risk Table to determine the patient's CHD Risk.        ATP III CLASSIFICATION (LDL):  <100     mg/dL   Optimal  161-096  mg/dL   Near or Above                    Optimal  130-159  mg/dL   Borderline  045-409  mg/dL   High  >811     mg/dL   Very High    Physical Findings: AIMS: Facial and Oral Movements Muscles of Facial Expression: None, normal Lips and Perioral Area: Minimal Jaw: None, normal Tongue: Minimal,Extremity Movements Upper (arms, wrists, hands, fingers): None, normal Lower (legs, knees, ankles, toes): None, normal, Trunk Movements Neck, shoulders, hips: None, normal, Overall Severity Severity of abnormal movements (highest score from questions above): Minimal Incapacitation due to abnormal movements: None, normal Patient's awareness of abnormal movements (rate only patient's report): Aware, no distress, Dental Status Current problems with teeth and/or dentures?: No Does patient usually wear dentures?: No  CIWA:  CIWA-Ar Total: 2 COWS:  COWS Total Score:  3  Musculoskeletal: Strength & Muscle Tone: within normal limits Gait & Station: normal Patient leans: N/A  Psychiatric Specialty Exam: Physical Exam  Nursing note and vitals reviewed. Constitutional: She is oriented to person, place, and time. She appears well-developed and well-nourished.  Cardiovascular: Normal rate.  Respiratory: Effort normal.  Musculoskeletal: Normal range of motion.  Neurological: She is alert and oriented to person, place, and time.  Skin: Skin is warm.    Review of Systems  Constitutional: Negative.   HENT: Negative.   Eyes: Negative.   Respiratory: Negative.   Cardiovascular: Negative.   Gastrointestinal: Negative.   Genitourinary: Negative.   Musculoskeletal: Negative.   Skin: Negative.   Neurological: Negative.   Endo/Heme/Allergies: Negative.   Psychiatric/Behavioral: Positive for depression. Negative for hallucinations and suicidal ideas. The patient has insomnia.     Blood pressure 123/67, pulse 89, temperature 98 F (36.7 C), temperature source Oral, resp. rate 18, height 5\' 3"  (1.6 m), weight 70.3 kg (155 lb), SpO2 100 %.Body mass index is 27.46 kg/m.  General Appearance: Casual  Eye Contact:  Good  Speech:  Clear and Coherent and Normal Rate  Volume:  Normal  Mood:  Depressed  Affect:  Depressed and Tearful  Thought Process:  Goal Directed and Descriptions of Associations: Intact  Orientation:  Full (Time, Place, and Person)  Thought Content:  WDL  Suicidal Thoughts:  No  Homicidal Thoughts:  No  Memory:  Immediate;   Good  Recent;   Good Remote;   Good  Judgement:  Good  Insight:  Good  Psychomotor Activity:  Normal  Concentration:  Concentration: Good and Attention Span: Good  Recall:  Good  Fund of Knowledge:  Good  Language:  Good  Akathisia:  No  Handed:  Right  AIMS (if indicated):     Assets:  Communication Skills Desire for Improvement Financial Resources/Insurance Housing Physical Health Social  Support Transportation  ADL's:  Intact  Cognition:  WNL  Sleep:  Number of Hours: 2.75   Problems Addressed: MDD severe  Treatment Plan Summary: Daily contact with patient to assess and evaluate symptoms and progress in treatment, Medication management and Plan is to:   -Start Rozerem 8 mg PO QHS for insomnia -Continue  Tegretol 600 mg PO BID for mood control -Continue Klonopin 1 mg PO BID for anxiety -Continue Gabapentin 100 mg PO TID for pain -Continue Vistaril 50 mg PO Q6H PRN for anxiety -Encourage group therapy participation -CSW to arrange PHP and IOP   Maryfrances Bunnellravis B Money, FNP 12/26/2016, 2:52 PM   Agree with NP Progress Note

## 2016-12-26 NOTE — Progress Notes (Signed)
D: Pt presents with a flat affect and an anxious mood. Pt rates depression 8/10. Anxiety 8/10. Hopelessness 8/10. Pt denies SI. Pt reports difficulty sleeping due to not having any sleep medicine available last night. Pt noted to be anxious throughout the afternoon. Pt tearful and stated that she was feeling paranoid after taking a nap this afternoon. Pt observed having crying spells throughout the day. Pt reported that she's afraid because she took too may Valium pills prior to coming into the hospital. Pt redirected by staff as needed. A: Medications reviewed with pt. Medications administered as ordered per MD. Verbal support provide. Pt encouraged to attend groups as tolerated. 15 minute checks performed for safety.  R: Pt compliant with tx.

## 2016-12-26 NOTE — Progress Notes (Signed)
Recreation Therapy Notes  Date: 12/26/16 Time: 0930 Location: 300 Hall Dayroom  Group Topic: Stress Management  Goal Area(s) Addresses:  Patient will verbalize importance of using healthy stress management.  Patient will identify positive emotions associated with healthy stress management.   Intervention: Stress Management  Activity :  Guided Imagery.  LRT introduced the stress management technique of guided imagery.  LRT read a script that allowed patients to envision their peaceful place.  Patients were to follow along as script was read.  Education:  Stress Management, Discharge Planning.   Education Outcome: Acknowledges edcuation/In group clarification offered/Needs additional education  Clinical Observations/Feedback: Pt did not attend group.    Saraann Enneking, LRT/CTRS         Avalee Castrellon A 12/26/2016 12:17 PM 

## 2016-12-27 MED ORDER — GABAPENTIN 100 MG PO CAPS
200.0000 mg | ORAL_CAPSULE | Freq: Three times a day (TID) | ORAL | Status: DC
  Administered 2016-12-27 – 2016-12-28 (×4): 200 mg via ORAL
  Filled 2016-12-27 (×8): qty 2

## 2016-12-27 NOTE — Progress Notes (Signed)
Pt observed at the beginning of the shift sitting in the dayroom watching TV.  She reports that she has an rough day and is feeling anxious, but cannot tell writer why.  She denies SI/HI/AVH.  She states that her anxiety will not go away.  She hopes that the doctor will soon find a combination of meds to help her so that she can function at home.  She came to the med window to get her evening meds and stated that she was feeling fearful about going to bed.  Pt was reassured that staff would be on the hall doing checks, and that she was safe in the hospital. Pt went to bed after receiving her meds.  She voiced no other needs or concerns.  Support and encouragement offered.  Discharge plans are in process.  Safety maintained with q15 minute checks.

## 2016-12-27 NOTE — Progress Notes (Signed)
Pt attend wrap up group. Her day was a 5. Her goal help control her anxiety with medicine. She plans to exercise the goals she has learned.

## 2016-12-27 NOTE — Progress Notes (Signed)
DAR NOTE: Patient presents with anxious affect and depressed mood. Pt has periods of crying spell, stating she misses her home, has put the husband through a lot. Pt stated she is still feeling depressed but not as bad. Pt has been observed interacting with peers and staff. Denies pain, auditory and visual hallucinations.  Rates depression at 8, hopelessness at 8, and anxiety at 8. Reports fair sleep, fair appetite, low energy, and poor concentration.  Maintained on routine safety checks.  Medications given as prescribed.  Support and encouragement offered as needed.  Attended group and participated.  States goal for today is " anxiety."  Will continue to monitor.

## 2016-12-27 NOTE — Progress Notes (Signed)
[facility]  12/27/2016 3:04 PM Danielle MartinezSheila K Cox  MRN:  [id number]   Subjective:  Patient reports ongoing depression, anxiety. Denies suicidal ideations. Currently focused on being discharged home, states she is missing her home and feels that her husband has been " through a lot", and wants to be home with him. Acknowledges decreased physical restlessness and also improved sleep last night.  Objective: Patient seen and case discussed with treatment team. Patient remains depressed, anxious, and was tearful at times during session. Motor restlessness, psychomotor agitation , pacing are all much improved and as this improvement temporally related to discontinuation of antipsychotics , suggest that she was experiencing akathisia. No abnormal oro-buccal movements noted today, was able to sit comfortably through our session today. Denies medication side effects. Remains labile, tearful at times . Denies suicidal ideations.   Principal Problem: Major depressive disorder, recurrent severe without psychotic features (HCC) Diagnosis:   Patient Active Problem List   Diagnosis Date Noted  . Major depressive disorder, recurrent severe without psychotic features (HCC) [F33.2] 12/13/2016  . Overweight (BMI 25.0-29.9) [E66.3] 03/02/2016  . Personality disorder (HCC) [F60.9] 04/23/2013  . Bipolar 1 disorder, depressed (HCC) [F31.9] 09/29/2012  . Current every day smoker [F17.200] 02/17/2009  . Essential hypertension [I10] 02/17/2009   Total Time spent with patient: 20 minutes  Past Psychiatric History: See H&P  Past Medical History:  Past Medical History:  Diagnosis Date  . Anxiety   . Bipolar disorder (HCC)   . Bronchitis, chronic (HCC)   . Depression   . Personality disorder [facility](HCC)     Past Surgical History:  Procedure Laterality Date  . CESAREAN SECTION  1983, 1988, 1992  . TONSILLECTOMY  age 56   Family History:  Family History  Problem Relation Age of Onset  . Depression  Mother   . Alcohol abuse Mother   . Depression Father   . Alcohol abuse Father   . Alcohol abuse Sister   . Depression Sister   . Alcohol abuse Brother   . Depression Brother    Family Psychiatric  History: See H&P Social History:  Social History   Substance and Sexual Activity  Alcohol Use No     Social History   Substance and Sexual Activity  Drug Use No    Social History   Socioeconomic History  . Marital status: Married    Spouse name: None  . Number of children: None  . Years of education: None  . Highest education level: None  Social Needs  . Financial resource strain: None  . Food insecurity - worry: None  . Food insecurity - inability: None  . Transportation needs - medical: None  . Transportation needs - non-medical: None  Occupational History  . None  Tobacco Use  . Smoking status: Current Every Day Smoker    Packs/day: 1.00    Types: Cigarettes  . Smokeless tobacco: Never Used  Substance and Sexual Activity  . Alcohol use: No  . Drug use: No  . Sexual activity: Yes    Partners: Male    Birth control/protection: None  Other Topics Concern  . None  Social History Narrative   10/29/2012 [name] [name] was born and grew up [place], [place]. She has 2 brothers and one sister. She reports that her childhood was "poor," and explains that she needs both financially and emotionally. She completed the 10th grade, then achieved her GED. She has been married twice. The first time at age 56, and that ended  after 1 year. She is currently married to her second husband of 27 years. She has 2 sons and one daughter. She is currently unemployed and on disability for 2 years. She lives with her husband, her daughter, and one of her daughter's friends. She denies any legal difficulties. She reports that she is spiritual but not religious. Her hobbies include reading and cross stitch. She reports that she has no social support network. 10/29/2012 [name]   Additional Social History:    Sleep: improving   Appetite:  Good  Current Medications: Current Facility-Administered Medications  Medication Dose Route Frequency Provider Last Rate Last Dose  . alum & mag hydroxide-simeth (MAALOX/MYLANTA) 200-200-20 MG/5ML suspension 30 mL  30 mL Oral Q4H PRN Charm Rings, NP   30 mL at 12/26/16 0338  . carbamazepine (TEGRETOL XR) 12 hr tablet 600 mg  600 mg Oral BID Georgiann Cocker, MD   600 mg at 12/27/16 0837  . cholecalciferol (VITAMIN D) tablet 1,000 Units  1,000 Units Oral Daily Charm Rings, NP   1,000 Units at 12/27/16 [phone]  . clonazePAM (KLONOPIN) tablet 1 mg  1 mg Oral BID Cobos, Rockey Situ, MD   1 mg at 12/27/16 0836  . feeding supplement (ENSURE ENLIVE) (ENSURE ENLIVE) liquid 237 mL  237 mL Oral TID WC PRN Money, Gerlene Burdock, FNP   237 mL at 12/24/16 1122  . fluticasone (FLONASE) 50 MCG/ACT nasal spray 2 spray  2 spray Each Nare BID PRN Money, Gerlene Burdock, FNP   2 spray at 12/26/16 0745  . gabapentin (NEURONTIN) capsule 200 mg  200 mg Oral TID Cobos, Fernando A, MD      . hydrOXYzine (ATARAX/VISTARIL) tablet 50 mg  50 mg Oral Q6H PRN Cobos, Rockey Situ, MD   50 mg at 12/27/16 9604  . ibuprofen (ADVIL,MOTRIN) tablet 600 mg  600 mg Oral Q6H PRN Money, Gerlene Burdock, FNP   600 mg at 12/27/16 1204  . magnesium hydroxide (MILK OF MAGNESIA) suspension 30 mL  30 mL Oral Daily PRN Charm Rings, NP   30 mL at 12/17/16 0810  . multivitamin with minerals tablet 1 tablet  1 tablet Oral Daily Cobos, Rockey Situ, MD   1 tablet at 12/27/16 0837  . nicotine (NICODERM CQ - dosed in mg/24 hours) patch 21 mg  21 mg Transdermal Daily Cobos, Rockey Situ, MD   21 mg at 12/27/16 5409  . ramelteon (ROZEREM) tablet 8 mg  8 mg Oral QHS Money, Gerlene Burdock, FNP   8 mg at 12/26/16 2140  . thiamine (VITAMIN B-1) tablet 100 mg  100 mg Oral Daily Cobos, Rockey Situ, MD   100 mg at 12/27/16 8119    Lab Results:  Results for orders placed or performed during the hospital encounter of 12/13/16 (from the past 48  hour(s))  Carbamazepine level, total     Status: None   Collection Time: 12/26/16  6:32 AM  Result Value Ref Range   Carbamazepine Lvl 9.7 4.0 - 12.0 ug/mL    Comment: Performed at [facility], [address] [place], [place] [address]    Blood Alcohol level:  Lab Results  Component Value Date   ETH <10 12/12/2016   ETH  08/03/2009    <5        LOWEST DETECTABLE LIMIT FOR SERUM ALCOHOL IS 5 mg/dL FOR MEDICAL PURPOSES ONLY    Metabolic Disorder Labs: Lab Results  Component Value Date   HGBA1C 5.6 12/15/2016  MPG 114 12/15/2016   No results found for: PROLACTIN Lab Results  Component Value Date   CHOL 301 (H) 12/15/2016   TRIG 270 (H) 12/15/2016   HDL 48 12/15/2016   CHOLHDL 6.3 12/15/2016   VLDL 54 (H) 12/15/2016   LDLCALC 199 (H) 12/15/2016   LDLCALC (H) 05/27/2008    149        Total Cholesterol/HDL:CHD Risk Coronary Heart Disease Risk Table                     Men   Women  1/2 Average Risk   3.4   3.3  Average Risk       5.0   4.4  2 X Average Risk   9.6   7.1  3 X Average Risk  23.4   11.0        Use the calculated Patient Ratio above and the CHD Risk Table to determine the patient's CHD Risk.        ATP III CLASSIFICATION (LDL):  <100     mg/dL   Optimal  [id number]  mg/dL   Near or Above                    Optimal  130-159  mg/dL   Borderline  [phone]  mg/dL   High  >518>190     mg/dL   Very High    Physical Findings: AIMS: Facial and Oral Movements Muscles of Facial Expression: None, normal Lips and Perioral Area: Minimal Jaw: None, normal Tongue: Minimal,Extremity Movements Upper (arms, wrists, hands, fingers): None, normal Lower (legs, knees, ankles, toes): None, normal, Trunk Movements Neck, shoulders, hips: None, normal, Overall Severity Severity of abnormal movements (highest score from questions above): Minimal Incapacitation due to abnormal movements: None, normal Patient's awareness of abnormal movements (rate only patient's report):  Aware, no distress, Dental Status Current problems with teeth and/or dentures?: No Does patient usually wear dentures?: No  CIWA:  CIWA-Ar Total: 2 COWS:  COWS Total Score: 3  Musculoskeletal: Strength & Muscle Tone: within normal limits Gait & Station: normal Patient leans: N/A  Psychiatric Specialty Exam: Physical Exam  Nursing note and vitals reviewed. Constitutional: She is oriented to person, place, and time. She appears well-developed and well-nourished.  Cardiovascular: Normal rate.  Respiratory: Effort normal.  Musculoskeletal: Normal range of motion.  Neurological: She is alert and oriented to person, place, and time.  Skin: Skin is warm.    Review of Systems  Constitutional: Negative.   HENT: Negative.   Eyes: Negative.   Respiratory: Negative.   Cardiovascular: Negative.   Gastrointestinal: Negative.   Genitourinary: Negative.   Musculoskeletal: Negative.   Skin: Negative.   Neurological: Negative.   Endo/Heme/Allergies: Negative.   Psychiatric/Behavioral: Positive for depression. Negative for hallucinations and suicidal ideas. The patient has insomnia.   no chest pain, no shortness of breath, no vomiting   Blood pressure (!) 108/52, pulse 84, temperature 97.8 F (36.6 C), resp. rate 18, height 5\' 3"  (1.6 m), weight 70.3 kg (155 lb), SpO2 100 %.Body mass index is 27.46 kg/m.  General Appearance: Fairly Groomed  Eye Contact:  Fair  Speech:  Normal Rate  Volume:  Normal  Mood:  remains depressed   Affect:  still constricted, tearful, but to lesser degree than prior   Thought Process:  Goal Directed and Descriptions of Associations: Intact  Orientation:  Other:  fully alert and attentive   Thought Content:  denies hallucinations, no delusions, not internally  preoccupied   Suicidal Thoughts:  No denies suicidal or self injurious ideations, denies homicidal or violent ideations  Homicidal Thoughts:  No  Memory: recent and remote fair   Judgement:  Fair-  improving  Insight:  Fair  Psychomotor Activity:  Normal- no psychomotor agitation or akathisia noted today   Concentration:  Concentration: Good and Attention Span: Good  Recall:  Good  Fund of Knowledge:  Good  Language:  Good  Akathisia:  No  Handed:  Right  AIMS (if indicated):     Assets:  Communication Skills Desire for Improvement Financial Resources/Insurance Housing Physical Health Social Support Transportation  ADL's:  Intact  Cognition:  WNL  Sleep:  Number of Hours: 6.75   Assessment - patient presents with partial improvement. In particular, no significant psychomotor agitation, pacing or psychomotor restlessness noted today, following discontinuation of antipsychotic medication, suggesting she was experiencing akathisia. She also slept better. She does remain depressed, sad , but is not suicidal and is now focusing on being discharged soon in order to reunite with husband . Tolerating medications well , Carbamazepine serum level within therapeutic at 9.7   Treatment Plan Summary: Treatment Plan reviewed as below today 12/13  Daily contact with patient to assess and evaluate symptoms and progress in treatment, Medication management and Plan is to:   -Continue  Rozerem 8 mg PO QHS for insomnia -Continue  Tegretol 600 mg PO BID for mood disorder  -Continue Klonopin 1 mg PO BID for anxiety -Increase  Gabapentin to 200 mg PO TID for anxiety -Continue Vistaril 50 mg PO Q6H PRN for anxiety -Encourage group therapy participation to work on Pharmacologist and symptom reduction -Treatment team working on Architect- as discussed with CSW and team , husband will be visiting her later today, and possible discharge home soon with plan of going to [facility] for outpatient follow up.    Craige Cotta, MD 12/27/2016, 3:04 PM   Patient ID: Danielle Cox, Danielle Cox   DOB: 11-May-1960, 56 y.o.   MRN: [id number]

## 2016-12-27 NOTE — Progress Notes (Signed)
CSW left message for Danielle Cox (pt's husband) [phone] requesting that he visit with pt this evening to note her progress and report back to CSW tomorrow morning, as her discharge will likely be in the next few days. Pt has been scheduled with Partial Hospitalization program beginning on Tuesday, at 2:30PM. Pt had been showing motivation to participate in this program and work on transportation.   Trula SladeHeather Smart, MSW, LCSW Clinical Social Worker 12/27/2016 1:52 PM

## 2016-12-27 NOTE — Progress Notes (Signed)
Adult Psychoeducational Group Note  Date:  12/27/2016 Time:  4:59 PM  Group Topic/Focus:  Goals Group:   The focus of this group is to help patients establish daily goals to achieve during treatment and discuss how the patient can incorporate goal setting into their daily lives to aide in recovery.  Participation Level:  Minimal  Participation Quality:  Attentive  Affect:  Flat  Cognitive:  Disorganized  Insight: Lacking  Engagement in Group:  Limited  Modes of Intervention:  Discussion  Additional Comments:  Pt was teary and cried during the entire group.   Donell BeersRodney S Theadore Blunck 12/27/2016, 4:59 PM

## 2016-12-28 DIAGNOSIS — F319 Bipolar disorder, unspecified: Secondary | ICD-10-CM

## 2016-12-28 MED ORDER — RAMELTEON 8 MG PO TABS: 8.0000 mg | ORAL_TABLET | Freq: Every day | ORAL | 0 refills | Status: DC

## 2016-12-28 MED ORDER — CLONAZEPAM 1 MG PO TABS
1.0000 mg | ORAL_TABLET | Freq: Two times a day (BID) | ORAL | 0 refills | Status: DC
Start: 2016-12-28 — End: 2017-09-18

## 2016-12-28 MED ORDER — CARBAMAZEPINE ER 200 MG PO TB12: 600.0000 mg | ORAL_TABLET | Freq: Two times a day (BID) | ORAL | 0 refills | Status: DC

## 2016-12-28 MED ORDER — HYDROXYZINE HCL 50 MG PO TABS: 50.0000 mg | ORAL_TABLET | Freq: Four times a day (QID) | ORAL | 0 refills | Status: DC | PRN

## 2016-12-28 MED ORDER — GABAPENTIN 100 MG PO CAPS: 200.0000 mg | ORAL_CAPSULE | Freq: Three times a day (TID) | ORAL | 0 refills | Status: DC

## 2016-12-28 NOTE — Tx Team (Signed)
Interdisciplinary Treatment and Diagnostic Plan Update  12/28/2016 Time of Session: 0830AM Danielle MartinezSheila K Cox MRN: [id number]  Principal Diagnosis: Major depressive disorder, recurrent severe without psychotic features [facility](HCC)  Secondary Diagnoses: Principal Problem:   Major depressive disorder, recurrent severe without psychotic features (HCC)   Current Medications:  Current Facility-Administered Medications  Medication Dose Route Frequency Provider Last Rate Last Dose  . alum & mag hydroxide-simeth (MAALOX/MYLANTA) 200-200-20 MG/5ML suspension 30 mL  30 mL Oral Q4H PRN Charm RingsLord, Jamison Y, NP   30 mL at 12/26/16 0338  . carbamazepine (TEGRETOL XR) 12 hr tablet 600 mg  600 mg Oral BID Georgiann CockerIzediuno, Vincent A, MD   600 mg at 12/28/16 0805  . cholecalciferol (VITAMIN D) tablet 1,000 Units  1,000 Units Oral Daily Charm RingsLord, Jamison Y, NP   1,000 Units at 12/28/16 0805  . clonazePAM (KLONOPIN) tablet 1 mg  1 mg Oral BID Cobos, Rockey SituFernando A, MD   1 mg at 12/28/16 0807  . feeding supplement (ENSURE ENLIVE) (ENSURE ENLIVE) liquid 237 mL  237 mL Oral TID WC PRN Money, [name], FNP   237 mL at 12/24/16 1122  . fluticasone (FLONASE) 50 MCG/ACT nasal spray 2 spray  2 spray Each Nare BID PRN Money, [name], FNP   2 spray at 12/26/16 0745  . gabapentin (NEURONTIN) capsule 200 mg  200 mg Oral TID Cobos, Rockey SituFernando A, MD   200 mg at 12/28/16 0805  . hydrOXYzine (ATARAX/VISTARIL) tablet 50 mg  50 mg Oral Q6H PRN Cobos, Rockey SituFernando A, MD   50 mg at 12/27/16 2159  . ibuprofen (ADVIL,MOTRIN) tablet 600 mg  600 mg Oral Q6H PRN Money, [name], FNP   600 mg at 12/27/16 1815  . magnesium hydroxide (MILK OF MAGNESIA) suspension 30 mL  30 mL Oral Daily PRN Charm RingsLord, Jamison Y, NP   30 mL at 12/17/16 0810  . multivitamin with minerals tablet 1 tablet  1 tablet Oral Daily Cobos, Rockey SituFernando A, MD   1 tablet at 12/28/16 0805  . nicotine (NICODERM CQ - dosed in mg/24 hours) patch 21 mg  21 mg Transdermal Daily Cobos, Rockey SituFernando A, MD   21 mg at  12/28/16 0807  . ramelteon (ROZEREM) tablet 8 mg  8 mg Oral QHS Money, [name], FNP   8 mg at 12/27/16 2159  . thiamine (VITAMIN B-1) tablet 100 mg  100 mg Oral Daily Cobos, Rockey SituFernando A, MD   100 mg at 12/28/16 0805   PTA Medications: Medications Prior to Admission  Medication Sig Dispense Refill Last Dose  . QUEtiapine (SEROQUEL XR) 300 MG 24 hr tablet Take 300 mg by mouth at bedtime.    12/11/2016 at Unknown time    Patient Stressors: Financial difficulties Traumatic event Other: chronic depression  Patient Strengths: General fund of knowledge Physical Health Supportive family/friends  Treatment Modalities: Medication Management, Group therapy, Case management,  1 to 1 session with clinician, Psychoeducation, Recreational therapy.   Physician Treatment Plan for Primary Diagnosis: Major depressive disorder, recurrent severe without psychotic features (HCC) Long Term Goal(s): Improvement in symptoms so as ready for discharge Improvement in symptoms so as ready for discharge   Short Term Goals: Ability to identify changes in lifestyle to reduce recurrence of condition will improve Ability to maintain clinical measurements within normal limits will improve Ability to verbalize feelings will improve Ability to disclose and discuss suicidal ideas Ability to demonstrate self-control will improve Ability to identify and develop effective coping behaviors will improve Ability to maintain clinical measurements  within normal limits will improve  Medication Management: Evaluate patient's response, side effects, and tolerance of medication regimen.  Therapeutic Interventions: 1 to 1 sessions, Unit Group sessions and Medication administration.  Evaluation of Outcomes: Adequate for discharge   Physician Treatment Plan for Secondary Diagnosis: Principal Problem:   Major depressive disorder, recurrent severe without psychotic features (HCC)  Long Term Goal(s): Improvement in symptoms so  as ready for discharge Improvement in symptoms so as ready for discharge   Short Term Goals: Ability to identify changes in lifestyle to reduce recurrence of condition will improve Ability to maintain clinical measurements within normal limits will improve Ability to verbalize feelings will improve Ability to disclose and discuss suicidal ideas Ability to demonstrate self-control will improve Ability to identify and develop effective coping behaviors will improve Ability to maintain clinical measurements within normal limits will improve     Medication Management: Evaluate patient's response, side effects, and tolerance of medication regimen.  Therapeutic Interventions: 1 to 1 sessions, Unit Group sessions and Medication administration.  Evaluation of Outcomes: Adequate for discharge   RN Treatment Plan for Primary Diagnosis: Major depressive disorder, recurrent severe without psychotic features (HCC) Long Term Goal(s): Knowledge of disease and therapeutic regimen to maintain health will improve  Short Term Goals: Ability to identify and develop effective coping behaviors will improve and Compliance with prescribed medications will improve  Medication Management: RN will administer medications as ordered by provider, will assess and evaluate patient's response and provide education to patient for prescribed medication. RN will report any adverse and/or side effects to prescribing provider.  Therapeutic Interventions: 1 on 1 counseling sessions, Psychoeducation, Medication administration, Evaluate responses to treatment, Monitor vital signs and CBGs as ordered, Perform/monitor CIWA, COWS, AIMS and Fall Risk screenings as ordered, Perform wound care treatments as ordered.  Evaluation of Outcomes: Adequate for discharge   LCSW Treatment Plan for Primary Diagnosis: Major depressive disorder, recurrent severe without psychotic features (HCC) Long Term Goal(s): Safe transition to appropriate  next level of care at discharge, Engage patient in therapeutic group addressing interpersonal concerns.  Short Term Goals: Engage patient in aftercare planning with referrals and resources, Increase social support and Increase skills for wellness and recovery  Therapeutic Interventions: Assess for all discharge needs, 1 to 1 time with Social worker, Explore available resources and support systems, Assess for adequacy in community support network, Educate family and significant other(s) on suicide prevention, Complete Psychosocial Assessment, Interpersonal group therapy.  Evaluation of Outcomes: Adequate for discharge   Progress in Treatment: Attending groups: Yes. Participating in groups: No. Taking medication as prescribed: Yes. Toleration medication: Yes. Family/Significant other contact made: Yes, individual(s) contacted:  husband; family session  Patient understands diagnosis: Yes. Discussing patient identified problems/goals with staff: Yes. Medical problems stabilized or resolved: Yes. Denies suicidal/homicidal ideation: Yes. Issues/concerns per patient self-inventory: No. Other: none  New problem(s) identified: No, Describe:  none  New Short Term/Long Term Goal(s): elimination of SI thoughts, development of comprehensive mental wellness plan, decrease in deprssion/anxiety symptoms.   Discharge Plan or Barriers: Pt has been scheduled to begin Partial Hospitalization Program for Tuesday, 12/18 at 10:20AM. Pt also has follow-up scheduled with her current providers at [facility].   Reason for Continuation of Hospitalization: none  Estimated Length of Stay: Friday, 12/28/16  Attendees: Patient: 12/24/2016   Physician: Dr Jama Flavors MD; Dr. Altamese [place] MD 12/24/2016   Nursing: Letitia Neri RN 12/24/2016   RN Care Manager:x 12/24/2016   Social Worker:  [employer], LCSW 12/24/2016  Recreational Therapist: x 12/24/2016   Other: Armandina StammerAgnes Nwoko NP; Feliz Beamravis Money NP 12/24/2016    Other:  12/24/2016   Other: 12/24/2016     Scribe for Treatment Team: Ledell PeoplesHeather N Smart, LCSW 12/28/2016 9:28 AM

## 2016-12-28 NOTE — B[facility] Suicide Risk Assessment (Signed)
[facility]   Principal Problem: Major depressive disorder, recurrent severe without psychotic features [facility](HCC) Discharge Diagnoses:  Patient Active Problem List   Diagnosis Date Noted  . Major depressive disorder, recurrent severe without psychotic features (HCC) [F33.2] 12/13/2016  . Overweight (BMI 25.0-29.9) [E66.3] 03/02/2016  . Personality disorder (HCC) [F60.9] 04/23/2013  . Bipolar 1 disorder, depressed (HCC) [F31.9] 09/29/2012  . Current every day smoker [F17.200] 02/17/2009  . Essential hypertension [I10] 02/17/2009    Total Time spent with patient: 30 minutes  Musculoskeletal: Strength & Muscle Tone: within normal limits Gait & Station: normal Patient leans: N/A  Psychiatric Specialty Exam: ROS denies chest pain, denies shortness of breath, denies vomiting   Blood pressure (!) 108/52, pulse 84, temperature 97.8 F (36.6 C), resp. rate 18, height 5\' 3"  (1.6 m), weight 70.3 kg (155 lb), SpO2 100 %.Body mass index is 27.46 kg/m.  General Appearance: improving grooming   Eye Contact::  Good  Speech:  Normal Rate409  Volume:  Normal  Mood:  partially improved mood , acknowledges she feels " better"  Affect:  less constricted, less tearful  Thought Process:  Linear and Descriptions of Associations: Intact  Orientation:  Full (Time, Place, and Person)  Thought Content:  denies hallucinations, no delusions, not internally preoccupied   Suicidal Thoughts:  No denies suicidal or self injurious ideations, denies homicidal or violent ideations  Homicidal Thoughts:  No  Memory:  recent and remote grossly intact  Judgement:  Other:  fair- improving   Insight:  fair- improving   Psychomotor Activity:  no significant restlessness or psychomtor agitation noted at this time  Concentration:  improved   Recall:  good  Fund of Knowledge:Good  Language: Good  Akathisia:  Negative- no akathisia noted today  Handed:  Right  AIMS (if indicated):     Assets:   Desire for Improvement Resilience Social Support  Sleep:  Number of Hours: 6  Cognition: WNL  ADL's:  Improved     Mental Status Per Nursing Assessment::   On Admission:  Self-harm thoughts, Self-harm behaviors  Demographic Factors:  56 year old married female   Loss Factors: Chronic Mental Illness , decreased level of functioning compared to premorbid level  Historical Factors: History of Bipolar Disorder diagnosis, history of prior psychiatric admissions , history of anxiety  Risk Reduction Factors:   Living with another person, especially a relative, Positive social support and Positive coping skills or problem solving skills  Continued Clinical Symptoms:  Patient presents with improved grooming , improving eye contact , and decreased episodes of tearfulness or severe anxiety. She acknowledges her mood is better, and that she feels less depressed, although she reports chronic, persistent depression. Affect is less labile, less tearful, less anxious, no thought disorder , denies suicidal or self injurious ideations at present, denies any homicidal ideations, no hallucinations, no delusions . Visible on unit, no disruptive or agitated behaviors  With her express consent I spoke with her husband, who corroborates patient is improving and who is in agreement with discharge home today. He will pick her up later today. Denies current medication side effects. Of note, restlessness and agitation decreased significantly after antipsychotic medication was discontinued and BZD was added . Would consider that symptoms were partially related to akathisia, improved with antipsychotic discontinuation.  Cognitive Features That Contribute To Risk:  No gross cognitive deficits noted upon discharge. Is alert , attentive, and oriented x 3    Suicide Risk:  Mild to Moderate  Follow-up Information    [facility] Follow up on 01/21/2017.   Specialty:  Behavioral Health Why:   Medication management appt with Ellis SavageLisa Poulos on Monday, 01/21/17 at 1:30PM. Therapy appt with Judge StallJoan Fraifield on 01/22/17 at 9:00AM. You have been placed on Joan's cancellation list. Thank you. Contact information: [address] [place] [address] [phone]        BEHAVIORAL HEALTH PARTIAL HOSPITALIZATION PROGRAM Follow up on 01/01/2017.   Specialty:  Behavioral Health Why:  Assessment for partial hospitalization program on Tuesday, 12/18 at 10:20AM. Please bring insurance card to this appt. Thank you.  Contact information: [address] [id number] mc [place] [phone]          Plan Of Care/Follow-up recommendations:  Activity:  as tolerated  Diet:  regular Tests:  NA Other:  See below Patient is expressing readiness for discharge  Plans to return home- husband will pick her up later today [facility]  Also has [facility] for ongoing medical management as needed. Craige CottaFernando A Cobos, MD 12/28/2016, 11:03 AM

## 2016-12-28 NOTE — Progress Notes (Signed)
Pt reports she is still very anxious, but denies having any self harm thoughts.  She denies HI/AVH.  She says she wants to get better so that she can go home with her husband.  She mostly sits in the dayroom watching TV with minimal interaction with the other patients.  She is taking her medications without any issues.  She makes her needs known to staff.  Support and encouragement offered.  Discharge plans are in process.  Safety maintained with q15 minute checks.

## 2016-12-28 NOTE — Progress Notes (Signed)
Adult Psychoeducational Group Note  Date:  12/28/2016 Time:  2:06 PM  Group Topic/Focus:  Healthy Communication:   The focus of this group is to discuss communication, barriers to communication, as well as healthy ways to communicate with others.  Participation Level:  Active  Participation Quality:  Appropriate  Affect:  Appropriate  Cognitive:  Alert  Insight: Good  Engagement in Group:  Engaged  Modes of Intervention:  Activity  Additional Comments:  Pt did participate in all activities and discussions.  Terence Googe R Tallis Soledad 12/28/2016, 2:06 PM

## 2016-12-28 NOTE — Progress Notes (Signed)
Recreation Therapy Notes  Date: 12/28/16  Time: 0930 Location: [address] Dayroom  Group Topic: Stress Management  Goal Area(s) Addresses:  Patient will verbalize importance of using healthy stress management.  Patient will identify positive emotions associated with healthy stress management.   Intervention: Stress Management  Activity :  Guided Imagery.  LRT introduced the stress management technique of guided imagery.  LRT read a script that allowed patients to envision lying in the sun and watching the clouds go by.  Patients were to listen and follow along as script was read.  Education:  Stress Management, Discharge Planning.   Education Outcome: Acknowledges edcuation/In group clarification offered/Needs additional education  Clinical Observations/Feedback: Pt did not attend group.    Caroll RancherMarjette Abigail Teall, LRT/CTRS         Caroll RancherLindsay, Khrystyna Schwalm A 12/28/2016 11:47 AM

## 2016-12-28 NOTE — Discharge Summary (Signed)
Physician Discharge Summary Note  Patient:  [place] [facility] is an 56 y.o., female MRN:  [id number] DOB:  16-Jan-1960 Patient phone:  [phone] (home)  Patient address:   [address] [place] [address],  Total Time spent with patient: 20 minutes  Date of Admission:  12/13/2016 Date of Discharge: 12/28/16   Reason for Admission:  Worsening depression with SI  Principal Problem: Major depressive disorder, recurrent severe without psychotic features [facility] [facility]) Discharge Diagnoses: Patient Active Problem List   Diagnosis Date Noted  . Major depressive disorder, recurrent severe without psychotic features (HCC) [F33.2] 12/13/2016  . Overweight (BMI 25.0-29.9) [E66.3] 03/02/2016  . Personality disorder (HCC) [F60.9] 04/23/2013  . Bipolar 1 disorder, depressed (HCC) [F31.9] 09/29/2012  . Current every day smoker [F17.200] 02/17/2009  . Essential hypertension [I10] 02/17/2009    Past Psychiatric History: patient states she has had several admissions since the 1990's, has been diagnosed with Bipolar Disorder. Denies history of self cutting. States she had never attempted suicide before. Denies history of psychosis. Describes history of panic attacks, agoraphobia. Denies history of violence    Past Medical History:  Past Medical History:  Diagnosis Date  . Anxiety   . Bipolar disorder (HCC)   . Bronchitis, chronic (HCC)   . Depression   . Personality disorder [facility])     Past Surgical History:  Procedure Laterality Date  . CESAREAN SECTION  1983, 1988, 1992  . TONSILLECTOMY  age 22   Family History:  Family History  Problem Relation Age of Onset  . Depression Mother   . Alcohol abuse Mother   . Depression Father   . Alcohol abuse Father   . Alcohol abuse Sister   . Depression Sister   . Alcohol abuse Brother   . Depression Brother    Family Psychiatric  History: states both parents were alcoholic, and brother had history of depression and committed suicide about 30  years ago  Social History:  Social History   Substance and Sexual Activity  Alcohol Use No     Social History   Substance and Sexual Activity  Drug Use No    Social History   Socioeconomic History  . Marital status: Married    Spouse name: None  . Number of children: None  . Years of education: None  . Highest education level: None  Social Needs  . Financial resource strain: None  . Food insecurity - worry: None  . Food insecurity - inability: None  . Transportation needs - medical: None  . Transportation needs - non-medical: None  Occupational History  . None  Tobacco Use  . Smoking status: Current Every Day Smoker    Packs/day: 1.00    Types: Cigarettes  . Smokeless tobacco: Never Used  Substance and Sexual Activity  . Alcohol use: No  . Drug use: No  . Sexual activity: Yes    Partners: Male    Birth control/protection: None  Other Topics Concern  . None  Social History Narrative   10/29/2012 [name] Amaiah was born and grew up in [place], [place]. She has 2 brothers and one sister. She reports that her childhood was "poor," and explains that she needs both financially and emotionally. She completed the 10th grade, then achieved her GED. She has been married twice. The first time at age 61, and that ended after 1 year. She is currently married to her second husband of 27 years. She has 2 sons and one daughter. She is currently unemployed and on disability  for 2 years. She lives with her husband, her daughter, and one of her daughter's friends. She denies any legal difficulties. She reports that she is spiritual but not religious. Her hobbies include reading and cross stitch. She reports that she has no social support network. 10/29/2012 [name]    [facility] Course:   12/13/16 [place] Counselor Assessment: Clinician reviewed note by Dr. Fayrene Fearing.  56 year old female. Ingested 5 mg of Valium today. She states that "maybe 100". States she gets these prescribed for anxiety. Felt suicidal  this morning. Took the medications. Called her husband this afternoon. She states she still wants to die stated she wanted to talk to him first. She is uncertain if she wanted help. Patient has a flat affect.  She does not elaborate on any questions. Pt does say she is still feeling suicidal. She still wants to kill herself.  She has had one previous suicide attempt. Patient denies any HI or A/V hallucinations. Patient denies use of ETOH or other drugs. Patient says she has been depressed for over 6 months.  She says that she has no hope and has been unable to sleep or eat well.   Patient has been to [place] [facility] recently but cannot tell how long ago.  Pt has a psychiatrist and a therapist.  12/14/16 [facility] MD Assessment: 56 year old married female. States she has history of Bipolar Disorder . Reports she has been " depressed for a long time", and describes recent suicidal ideations. She states that two days she impulsively overdosed on Valium. Reports she took " about 120 of them" 2 days ago. States she told her husband about overdose and was brought to ED. She does not identify any specific stressors that may be contributing to worsening mood symptoms. Reports neuro-vegetative symptoms of depression as below. Denies psychotic symptoms. Patient presents anxious,  tearful during session, and states she feels scared and " upset ". Of note, patient has had recent psychiatric admissions and was most  recently admitted to a psychiatric unit at [facility] in [place] for depression and suicidal ideations- was discharged on Wellbutrin XL, Carbamazepine , Remeron, Valium , Trazodone, Vitamin D3.  Patient states she has been taking medications regularly , but is unsure what medications she has been taking recently .  Patient remained on the [facility] unit for 15 days and stabilized with medication and therapy. Patient was restarted on Seroquel but was discontinued. Patient was started on Gabapentin 200 mg TID, Tegretol  XR 600 mg Q12H, Vistaril 50 mg Q6H PRN, Klonopin 1 mg BID, and Rozerem 8 mg QHS. Patient was trialed on Cymbalta 30 mg and she appeared to be activated in less than 24 hours and it was discontinued. Patient showed significant improvement during visit. She can still become tearful during conversation, but it only occurs seldom now as opposed to constantly. She has realized she needs to motivate herself to do more and she has been coming out of her room and attending groups and participating. She has continued denying SI/HI/AVh and contracts for safety. She had been on 1:1 for safety at the beginning of her stay. Patient has agreed to go to [facility] and then transition to IOP and then to outpatient treatment through [facility]. Patient is provided with prescriptions of her medications upon discharge.     Physical Findings: AIMS: Facial and Oral Movements Muscles of Facial Expression: None, normal Lips and Perioral Area: Minimal Jaw: None, normal Tongue: Minimal,Extremity Movements Upper (arms, wrists, hands, fingers): None, normal Lower (legs,  knees, ankles, toes): None, normal, Trunk Movements Neck, shoulders, hips: None, normal, Overall Severity Severity of abnormal movements (highest score from questions above): Minimal Incapacitation due to abnormal movements: None, normal Patient's awareness of abnormal movements (rate only patient's report): Aware, no distress, Dental Status Current problems with teeth and/or dentures?: No Does patient usually wear dentures?: No  CIWA:  CIWA-Ar Total: 2 COWS:  COWS Total Score: 3  Musculoskeletal: Strength & Muscle Tone: within normal limits Gait & Station: normal Patient leans: N/A  Psychiatric Specialty Exam: Physical Exam  Nursing note and vitals reviewed. Constitutional: She is oriented to person, place, and time. She appears well-developed and well-nourished.  Cardiovascular: Normal rate.  Respiratory: Effort normal.  Musculoskeletal: Normal range of  motion.  Neurological: She is alert and oriented to person, place, and time.  Skin: Skin is warm.    Review of Systems  Constitutional: Negative.   HENT: Negative.   Eyes: Negative.   Respiratory: Negative.   Cardiovascular: Negative.   Gastrointestinal: Negative.   Genitourinary: Negative.   Musculoskeletal: Negative.   Skin: Negative.   Neurological: Negative.   Endo/Heme/Allergies: Negative.   Psychiatric/Behavioral: Negative.     Blood pressure (!) 108/52, pulse 84, temperature 97.8 F (36.6 C), resp. rate 18, height 5\' 3"  (1.6 m), weight 70.3 kg (155 lb), SpO2 100 %.Body mass index is 27.46 kg/m.  General Appearance: Casual  Eye Contact:  Good  Speech:  Clear and Coherent and Normal Rate  Volume:  Normal  Mood:  Depressed  Affect:  Flat  Thought Process:  Goal Directed and Descriptions of Associations: Intact  Orientation:  Full (Time, Place, and Person)  Thought Content:  WDL  Suicidal Thoughts:  No  Homicidal Thoughts:  No  Memory:  Immediate;   Good Recent;   Good Remote;   Good  Judgement:  Good  Insight:  Good  Psychomotor Activity:  Normal  Concentration:  Concentration: Good and Attention Span: Good  Recall:  Good  Fund of Knowledge:  Good  Language:  Good  Akathisia:  No  Handed:  Right  AIMS (if indicated):     Assets:  Communication Skills Desire for Improvement Financial Resources/Insurance Housing Physical Health Social Support Transportation  ADL's:  Intact  Cognition:  WNL  Sleep:  Number of Hours: 6     Have you used any form of tobacco in the last 30 days? (Cigarettes, Smokeless Tobacco, Cigars, and/or Pipes): Yes  Has this patient used any form of tobacco in the last 30 days? (Cigarettes, Smokeless Tobacco, Cigars, and/or Pipes) Yes, Yes, A prescription for an FDA-approved tobacco cessation medication was offered at discharge and the patient refused  Blood Alcohol level:  Lab Results  Component Value Date   ETH <10 12/12/2016    [facility]  [facility]    <5        LOWEST DETECTABLE LIMIT FOR SERUM ALCOHOL IS 5 mg/dL FOR MEDICAL PURPOSES ONLY    Metabolic Disorder Labs:  Lab Results  Component Value Date   HGBA1C 5.6 12/15/2016   MPG 114 12/15/2016   No results found for: PROLACTIN Lab Results  Component Value Date   CHOL 301 (H) 12/15/2016   TRIG 270 (H) 12/15/2016   HDL 48 12/15/2016   CHOLHDL 6.3 12/15/2016   VLDL 54 (H) 12/15/2016   LDLCALC 199 (H) 12/15/2016   LDLCALC (H) 05/27/2008    149        Total Cholesterol/HDL:CHD Risk Coronary Heart Disease Risk Table  Men   Women  1/2 Average Risk   3.4   3.3  Average Risk       5.0   4.4  2 X Average Risk   9.6   7.1  3 X Average Risk  23.4   11.0        Use the calculated Patient Ratio above and the CHD Risk Table to determine the patient's CHD Risk.        ATP III CLASSIFICATION (LDL):  <100     mg/dL   Optimal  [id number]  mg/dL   Near or Above                    Optimal  130-159  mg/dL   Borderline  [id number]  mg/dL   High  >811>190     mg/dL   Very High    See Psychiatric Specialty Exam and Suicide Risk Assessment completed by Attending Physician prior to discharge.  Discharge destination:  Home  Is patient on multiple antipsychotic therapies at discharge:  No   Has Patient had three or more failed trials of antipsychotic monotherapy by history:  No  Recommended Plan for Multiple Antipsychotic Therapies: NA   Allergies as of 12/28/2016      Reactions   Penicillins Rash   Has patient had a PCN reaction causing immediate rash, facial/tongue/throat swelling, SOB or lightheadedness with hypotension: Yes Has patient had a PCN reaction causing severe rash involving mucus membranes or skin necrosis: No Has patient had a PCN reaction that required hospitalization: No Has patient had a PCN reaction occurring within the last 10 years: No If all of the above answers are "NO", then may proceed with Cephalosporin use.   Moxifloxacin  Hcl Palpitations      Medication List    STOP taking these medications   QUEtiapine 300 MG 24 hr tablet Commonly known as:  SEROQUEL XR     TAKE these medications     Indication  carbamazepine 200 MG 12 hr tablet Commonly known as:  TEGRETOL XR Take 3 tablets (600 mg total) by mouth 2 (two) times daily. For mood stability  Indication:  Manic-Depression   clonazePAM 1 MG tablet Commonly known as:  KLONOPIN Take 1 tablet (1 mg total) by mouth 2 (two) times daily. For anxiety  Indication:  mood stability, anxiety   gabapentin 100 MG capsule Commonly known as:  NEURONTIN Take 2 capsules (200 mg total) by mouth 3 (three) times daily.  Indication:  Agitation, mood stability   hydrOXYzine 50 MG tablet Commonly known as:  ATARAX/VISTARIL Take 1 tablet (50 mg total) by mouth every 6 (six) hours as needed for anxiety.  Indication:  Feeling Anxious   ramelteon 8 MG tablet Commonly known as:  ROZEREM Take 1 tablet (8 mg total) by mouth at bedtime. For sleep  Indication:  Trouble Sleeping      Follow-up Information    [facility] Follow up on 01/21/2017.   Specialty:  Behavioral Health Why:  Medication management appt with Ellis SavageLisa Poulos on Monday, 01/21/17 at 1:30PM. Therapy appt with Judge StallJoan Fraifield on 01/22/17 at 9:00AM. You have been placed on Joan's cancellation list. Thank you. Contact information: [address] [place] [address] [phone]        BEHAVIORAL HEALTH PARTIAL HOSPITALIZATION PROGRAM Follow up on 01/01/2017.   Specialty:  Behavioral Health Why:  Assessment for partial hospitalization program on Tuesday, 12/18 at 10:20AM. Please bring insurance card to  this appt. Thank you.  Contact information: [address] [id number] mc [place] [address] [phone]          Follow-up recommendations:  Continue activity as tolerated. Continue diet as recommended by your PCP. Ensure to keep all  appointments with outpatient providers.  Comments:  Patient is instructed prior to discharge to: Take all medications as prescribed by his/her mental healthcare provider. Report any adverse effects and or reactions from the medicines to his/her outpatient provider promptly. Patient has been instructed & cautioned: To not engage in alcohol and or illegal drug use while on prescription medicines. In the event of worsening symptoms, patient is instructed to call the crisis hotline, 911 and or go to the nearest ED for appropriate evaluation and treatment of symptoms. To follow-up with his/her primary care provider for your other medical issues, concerns and or health care needs.    Signed: Gerlene Burdock Money, FNP 12/28/2016, 10:42 AM   Patient seen, Suicide Assessment Completed.  Disposition Plan Reviewed

## 2016-12-28 NOTE — Progress Notes (Signed)
Pt discharged home with the husband. Pt was ambulatory, stable and appreciative at that time. All papers and prescriptions were given and valuables returned. Verbal understanding expressed. Denies SI/HI and A/VH. Pt given opportunity to express concerns and ask questions.  

## 2016-12-28 NOTE — Progress Notes (Signed)
  [facility] :  Will you be returning to the same living situation after discharge:  Yes,  home  At discharge, do you have transportation home?: Yes,  pt's husband coming between 5pm-6pm today Do you have the ability to pay for your medications: Yes,  [facility] medicare/private insurance  Release of information consent forms completed and submitted to medical records by CSW.  Patient to Follow up at: Follow-up Information    [facility] Follow up on 01/21/2017.   Specialty:  Behavioral Health Why:  Medication management appt with Ellis SavageLisa Poulos on Monday, 01/21/17 at 1:30PM. Therapy appt with Judge StallJoan Fraifield on 01/22/17 at 9:00AM. You have been placed on Joan's cancellation list. Thank you. Contact information: [address] [place] [address] [phone]        BEHAVIORAL HEALTH PARTIAL HOSPITALIZATION PROGRAM Follow up on 01/01/2017.   Specialty:  Behavioral Health Why:  Assessment for partial hospitalization program on Tuesday, 12/18 at 10:20AM. Please bring insurance card to this appt. Thank you.  Contact information: [address] [id number] [place] [phone]          Next level of care provider has [facility] to [facility] Link:no  Safety Planning and Suicide Prevention discussed: Yes,  SPE and family session with pt's husband. SPI pamphlet and Mobile Crisis information provided to pt.   Have you used any form of tobacco in the last 30 days? (Cigarettes, Smokeless Tobacco, Cigars, and/or Pipes): Yes  Has patient been referred to the Quitline?: Patient refused referral  Patient has been referred for addiction treatment: N/A  Pulte HomesHeather N Smart, LCSW 12/28/2016, 10:42 AM

## 2017-01-01 ENCOUNTER — Other Ambulatory Visit (HOSPITAL_COMMUNITY): Payer: Medicare Other | Attending: Psychiatry | Admitting: Professional

## 2017-01-01 ENCOUNTER — Telehealth (HOSPITAL_COMMUNITY): Payer: Self-pay | Admitting: Professional

## 2017-01-01 DIAGNOSIS — F314 Bipolar disorder, current episode depressed, severe, without psychotic features: Secondary | ICD-10-CM | POA: Insufficient documentation

## 2017-01-01 DIAGNOSIS — Z818 Family history of other mental and behavioral disorders: Secondary | ICD-10-CM | POA: Diagnosis not present

## 2017-01-01 DIAGNOSIS — F319 Bipolar disorder, unspecified: Secondary | ICD-10-CM

## 2017-01-01 DIAGNOSIS — Z79899 Other long term (current) drug therapy: Secondary | ICD-10-CM | POA: Diagnosis not present

## 2017-01-01 DIAGNOSIS — Z915 Personal history of self-harm: Secondary | ICD-10-CM | POA: Diagnosis not present

## 2017-01-01 DIAGNOSIS — F17210 Nicotine dependence, cigarettes, uncomplicated: Secondary | ICD-10-CM | POA: Insufficient documentation

## 2017-01-01 DIAGNOSIS — Z811 Family history of alcohol abuse and dependence: Secondary | ICD-10-CM | POA: Diagnosis not present

## 2017-01-01 DIAGNOSIS — F0789 Other personality and behavioral disorders due to known physiological condition: Secondary | ICD-10-CM | POA: Insufficient documentation

## 2017-01-01 DIAGNOSIS — Z881 Allergy status to other antibiotic agents status: Secondary | ICD-10-CM | POA: Diagnosis not present

## 2017-01-01 DIAGNOSIS — Z880 Allergy status to penicillin: Secondary | ICD-10-CM | POA: Insufficient documentation

## 2017-01-01 DIAGNOSIS — F329 Major depressive disorder, single episode, unspecified: Secondary | ICD-10-CM | POA: Diagnosis present

## 2017-01-01 DIAGNOSIS — R4589 Other symptoms and signs involving emotional state: Secondary | ICD-10-CM | POA: Diagnosis not present

## 2017-01-01 NOTE — Telephone Encounter (Signed)
[name] called to get more details abt PHP. Cln explained to husband that PHP focuses on developing coping skills and then people step down to individual counseling.  Husband states he is concerned about cost of PHP and only lasting 3 weeks, "She was just in the hospital for 2 weeks running up bills and that didn't help."  Cln provided information about calling Medicare to check on coverage, and information was also provided to pt and daughter during CCA. Husband states that TMS appt was canceled due to pt being in the hospital. Cln recommended f/u with TMS. Cln also provided info about MHA and husband states they have tried MHA support groups, working at [employer], and [facility] before but pt never continues to go. Cln recommended reaching out to pt's psychiatrist for more options since cln sat with pt for only 1 hour. Husband stated he wanted to do more research about options and would call cln back later today.
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
## 2017-01-02 ENCOUNTER — Other Ambulatory Visit (HOSPITAL_COMMUNITY): Payer: Medicare Other | Admitting: Licensed Clinical Social Worker

## 2017-01-02 DIAGNOSIS — F314 Bipolar disorder, current episode depressed, severe, without psychotic features: Secondary | ICD-10-CM | POA: Diagnosis not present
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
## 2017-01-03 ENCOUNTER — Other Ambulatory Visit (HOSPITAL_COMMUNITY): Payer: Medicare Other | Admitting: Licensed Clinical Social Worker

## 2017-01-03 ENCOUNTER — Other Ambulatory Visit (HOSPITAL_COMMUNITY): Payer: Medicare Other | Admitting: Specialist

## 2017-01-03 ENCOUNTER — Encounter (HOSPITAL_COMMUNITY): Payer: Self-pay

## 2017-01-03 VITALS — BP 104/70 | HR 87 | Ht 65.0 in | Wt 159.0 lb

## 2017-01-03 DIAGNOSIS — F314 Bipolar disorder, current episode depressed, severe, without psychotic features: Secondary | ICD-10-CM | POA: Diagnosis not present

## 2017-01-03 DIAGNOSIS — F0789 Other personality and behavioral disorders due to known physiological condition: Secondary | ICD-10-CM

## 2017-01-03 DIAGNOSIS — R4589 Other symptoms and signs involving emotional state: Secondary | ICD-10-CM

## 2017-01-03 MED ORDER — HYDROXYZINE HCL 50 MG PO TABS: 50.0000 mg | ORAL_TABLET | Freq: Four times a day (QID) | ORAL | 1 refills | Status: DC | PRN

## 2017-01-03 NOTE — Progress Notes (Signed)
Patient presents with flat affect, depressed mood and continued to cry often during time with nurse.  Patient reported she recently tried to overdose "on a lot of pills" as the reason she ended up in the hospital and reported this was her only attempt at trying to harm self.  Patient reported she has been depressed and not improving for some period of time as she rates her current depression a 10, anxiety a 10 and hopelessness a 10 on a scale of 0-10 with 0 being none and 10 the worst she has to manage.  Patient reports she has daily pain, currently at a level of 8 on a scale of 0-10 with 0 being none and 10 the worst she has to manage.  States this is from chronic and often continuous headaches but was not able to elaborate on cause or if related to current increased depressive symptoms.   Patient denied any current or recent suicidal ideations, no plan or intent to harm-self or others and reported plan to continue with Partial Hospitalization Program and current medications she was discharged on from [facility] inpatient recently.  States her husband brought her in today and met with her and Dr. Lovena Le.  States her husband and visiting daughter are the ones helping her with medications and management of daily affairs currently as she has been unable to manage these on her own.  Patient scored a 23 on her PHQ9 depression screening today but again denied any thoughts, plans or intent to want to harm self.  States she plans to continue to attend PHP with family bringing her even after her daughter returns home soon and reports her husband is supportive.  Patient reports currently having COPD and smokes at least a pack a day of cigarettes with no plans or desire to stop at this time.  Discussed side effects of medications and health risks and patient to contact this nurse, Dr. Lovena Le or [facility] staff if any problems with increased symptoms, medication issues or feels she may want to harm self again as she recently attempted.   Patient returned to [facility] group and remained tearful but receptive to group therapy and assistance.

## 2017-01-03 NOTE — Progress Notes (Signed)
Progress note    I talked with Mr Danielle Cox and he was very helpful.  No medicine has helped much in the past.  No therapy has helped either.  Depression has gotten worse over the last year and much worse over the last few months.  He says the hydroxyzine has been helpful and the clonazepam has been helpful.  The Tegretol has not seemed to do anything so far.  The Rozerem helps sleep for about 2 hours and is very expensive.  He will check into both TMS and ketamine by consulting with Dr Betti Cruzeddy at [facility].  The dyskinesia has been present for about a year he says.  I will hold off on Ingrezza for now so as not to be doing too many things at once.  I want to see how the PHP helps as that is necessary.  Her husband says he worries that even when she feels better she has no interests or hobbies and so what is the point of feeling better just to start feeling lonely and then getting depressed again because of that.  The plan is to see if she can feel better first and work on how to maintain the progress as we go along.     He says she seems a little better but she is still crying and saying " I am confused.  I need something now".

## 2017-01-04 ENCOUNTER — Other Ambulatory Visit (HOSPITAL_COMMUNITY): Payer: Medicare Other | Admitting: Licensed Clinical Social Worker

## 2017-01-04 ENCOUNTER — Encounter (HOSPITAL_COMMUNITY): Payer: Self-pay | Admitting: Specialist

## 2017-01-04 DIAGNOSIS — R4589 Other symptoms and signs involving emotional state: Secondary | ICD-10-CM

## 2017-01-04 DIAGNOSIS — F314 Bipolar disorder, current episode depressed, severe, without psychotic features: Secondary | ICD-10-CM | POA: Diagnosis not present

## 2017-01-04 NOTE — Psych (Signed)
   [facility]  Danielle MartinezSheila K Cox [id number]  Session Time: 9 AM - 2PM  Participation Level: Minimal  Behavioral Response: CasualConfusedAnxious and Depressed  Type of Therapy: Group Therapy; Psychotherapy; Psychoeducation, Activity therapy, Spiritual care  Treatment Goals addressed: Coping  Interventions: CBT, DBT, Supportive and Reframing  Summary:  9:00 - 10:30 Clinician led check-in regarding current stressors and situation, and review of patient completed daily inventory. Clinician utilized active listening and empathetic response and validated patient emotions. Clinician facilitated processing group on pertinent issues.  10:30 -12:00 Spiritual care group  12:00 - 12:45 Reflection group: Patients encouraged to practice skills and interpersonal techniques or work on mindfulness and relaxation techniques. The importance of self-care and making skills part of a routine to increase usage were stressed.  12:45 - 1:50 Relaxation group: Cln Forde RadonLeanne Yates led yoga group focused on retraining the body's response to stress.  1:50 - 2:00 Clinician led check-out. Clinician assessed for immediate needs, medication compliance and efficacy, and safety concerns.    Suicidal/Homicidal: Nowithout intent/plan  Therapist Response: Danielle MartinezSheila K Cox is a 56 y.o. female who presents with depression symptoms. Patient arrived within time allowed and reports she is feeling "confused." Patient rates her mood at a 1 on a scale of 1-10 with 10 being great. Pt is largely uncommunicative during group, crying throughout most of the time. Pt shares that she is "confused" and has been isolating and being around new people is a "struggle." Patient did not engage in activity and discussion. Patient demonstrates some progress as evidenced by sitting through her first group. Patient denies SI/HI/self-harm at the end of group.   Plan: Pt will continue in PHP and medication management to address  depression symptoms while working towards increased functioning AEB decreased crying spells, increased participation, and increased ability to self manage symptoms.   Diagnosis: No primary diagnosis found.    1. Bipolar 1 disorder, depressed, severe (HCC)       Donia GuilesJenny Kelven Flater, LCSW 01/04/2017

## 2017-01-04 NOTE — Psych (Signed)
   [facility]  Danielle MartinezSheila K Cox [id number]  Session Time: 9 AM - 2PM  Participation Level: Minimal  Behavioral Response: CasualConfusedAnxious and Depressed  Type of Therapy: Group Therapy; Psychotherapy; Psychoeducation, Activity therapy, Occupational Therapy  Treatment Goals addressed: Coping  Interventions: CBT, DBT, Supportive and Reframing  Summary:  9:00 - 10:30 Clinician led check-in regarding current stressors and situation, and review of patient completed daily inventory. Clinician utilized active listening and empathetic response and validated patient emotions. Clinician facilitated processing group on pertinent issues.  10:30 -11:30: OT Group  11:30-12:15: Clinician led psychoeducation group on distress tolerance. The STOP skill was introduced, and patients discussed how to utilize it. Patients identified when this technique may be helpful in their personal lives.  12:15 - 1:00: Reflection group: Patients encouraged to practice skills and interpersonal techniques or work on mindfulness and relaxation techniques. The importance of self-care and making skills part of a routine to increase usage were stressed.  1:00 - 1:50 Clinician continued topic of "Distress Tolerance". Group discussed "TIPS" and how/when patients can employ this method to help. Patients identified when this technique may be helpful in their personal lives.  1:50 - 2:00 Clinician led check-out. Clinician assessed for immediate needs, medication compliance and efficacy, and safety concerns.    Suicidal/Homicidal: Nowithout intent/plan  Therapist Response: Danielle MartinezSheila K Cox is a 56 y.o. female who presents with depression symptoms. Patient arrived within time allowed and reports she is feeling "confused." Patient rates her mood at a 1 on a scale of 1-10 with 10 being great. Pt is largely uncommunicative during group, crying throughout most of the time. Pt reports she is giving group a try  because she does not know what else will work. Patient engaged minimally in activity and discussion. Patient demonstrates some progress as evidenced by decreased tearfulness and crying from previous day. Patient denies SI/HI/self-harm at the end of group.   Plan: Pt will continue in PHP and medication management to address depression symptoms while working towards increased functioning AEB decreased crying spells, increased participation, and increased ability to self manage symptoms.   Diagnosis: Bipolar 1 disorder, depressed, severe (HCC) [F31.4]    1. Bipolar 1 disorder, depressed, severe (HCC)     Danielle Cox, LPCA 01/04/2017

## 2017-01-04 NOTE — Psych (Signed)
Individual Session 11:30-12:00  Pt presents to group as very tearful and depressed.  Pt's participation has increased some, but is still very minimal.  Pt was unable to complete assessments on her own stating, "I don't know what to do with these. I can't understand these."  Cln read assessments aloud to pt and allowed pt to verbally answer.  Pt reports this was helpful to her.  Pt reports she is "OK" with coming to group but is still feeling hopeless about finding a treatment that works for her.  Pt states the meeting with Dr. Ladona Ridgelaylor, her husband, and herself went well this morning.  Pt is still frustrated that the "right" medicines have not been found yet. Cln spent some time with pt discussing how medication management is not an "exact science" and how, unfortunately, it can take a while to find the right combination.  Pt reports she is going to continue to come to group and try to get as much as possible out of it because "I don't want to be like this anymore."  Pt denies any SI/HI/AVH at the end of session.  Keyion Knack, LPCA

## 2017-01-04 NOTE — Psych (Signed)
   [facility]  Danielle MartinezSheila K Cozza [id number]  Session Time: 9 AM - 1PM  Participation Level: Minimal  Behavioral Response: CasualConfusedAnxious and Depressed  Type of Therapy: Group Therapy; Psychotherapy; Psychoeducation  Treatment Goals addressed: Coping  Interventions: CBT, DBT, Supportive and Reframing  Summary:  9:00 - 10:30 Clinician led check-in regarding current stressors and situation, and review of patient completed daily inventory. Clinician utilized active listening and empathetic response and validated patient emotions. Clinician facilitated processing group on pertinent issues.  10:30 -11:30: Clinician led psychotherapy group on topic of how stigma affects interpersonal relationships.  11:30-12:50: Clinician continued with topic of "Distress Tolerance". Clinician discussed "Accepts" skill and how/when patients can employ this method to help. Patients identified when these techniques may be helpful in their personal lives.  12:50 - 1:00 Clinician led check-out. Clinician assessed for immediate needs, medication compliance and efficacy, and safety concerns.    Suicidal/Homicidal: Nowithout intent/plan  Therapist Response: Danielle MartinezSheila K Radabaugh is a [age] y.o. female who presents with depression and anxiety symptoms. Patient arrived within time allowed and reports she is "struggling." Patient rates her mood at a 2 on a scale of 1-10 with 10 being great. Pt is largely uncommunicative during group, crying throughout most of the time.  Pt does participate in group more than previously.  Pt shares that her house is in "shambles" which makes it even harder to focus, but that she does not have the energy or motivation to do anything about it.  Pt reports her husband and daughter argued last night which increased her anxiety. Patient minimally engage in activity and discussion. Patient demonstrates some progress as evidenced by increased participation in group. Patient  denies SI/HI/self-harm at the end of group.   Plan: Pt will continue in PHP and medication management to address depression symptoms while working towards increased functioning AEB decreased crying spells, increased participation, and increased ability to self manage symptoms.   Diagnosis: Bipolar 1 disorder, depressed, severe (HCC) [F31.4]    1. Bipolar 1 disorder, depressed, severe (HCC)   2. Difficulty coping     Quinn AxeWhitney J Joeli Fenner, LPCA 01/04/2017

## 2017-01-04 NOTE — Therapy (Signed)
[facility] PRO[facility] [address] [place], [place], [address] Phone: [phone]   Fax:  [phone]  Occupational Therapy Evaluation  Patient Details  Name: Danielle Cox MRN: [id number] Date of Birth: 03-27-1960 No Data Recorded  Encounter Date: 01/03/2017  OT End of Session - 01/04/17 0951    Visit Number  1    Number of Visits  6    Date for OT Re-Evaluation  01/24/17    Authorization Type  [employer] Medicare    Authorization Time Period  before 10th visit    Authorization - Visit Number  1    Authorization - Number of Visits  10    OT Start Time  1030    OT Stop Time  1130    OT Time Calculation (min)  60 min    Activity Tolerance  Patient tolerated treatment well    Behavior During Therapy  [facility] for tasks assessed/performed       Past Medical History:  Diagnosis Date  . Anxiety   . Bipolar disorder (HCC)   . Bronchitis, chronic (HCC)   . COPD (chronic obstructive pulmonary disease) (HCC)   . Depression   . Personality disorder [facility](HCC)     Past Surgical History:  Procedure Laterality Date  . CARPAL TUNNEL RELEASE Right   . CESAREAN SECTION  1983, 1988, 1992  . TONSILLECTOMY  age 56    There were no vitals filed for this visit.  Subjective Assessment - 01/04/17 0947    Currently in Pain?  No/denies       OT assessment Diagnosis  Bipolar Disorder, Difficulty Coping Past medical history COPD Living situation with husband ADLs requires assist with day to day activities and medication management due to depression Work on disability due to mental health diagnosis Leisure n/a Social support husband and daughter Struggles feels hopeless OT goal learn coping strategies to combat feelings of isolation General Causality Orientation Scale   Subscore Percentile Score  Autonomy 51 41.64  Control 45 31.73  Impersonal 39 20.68   Motivation Type  Motivation type Explanation  o Autonomy-oriented The individual is  clear about what he or she is doing.  There is clear connection between behavior and interest/personal goals.  Motivation is intact.  o    o    o    Assessment:  Patient demonstrates autonomy motivation type.  Patient will benefit from occupational therapy intervention in order to improve time management, financial management, stress management, job readiness skills, social skills,sleep hygiene, exercise and healthy eating habits,  and health management skills and other psychosocial skills needed for preparation to return to full time community living and to be a productive community member.   Plan:  Patient will participate in skilled occupational therapy sessions individually or in a group setting to improve coping skills, psychosocial skills, and emotional skills required to return to prior level of function as a productive community member. Treatment will be 1-2 times per week for 2-6 weeks.      S:  I tend to isolate myself O:  Patient participated in skilled occupational therapy group this day focusing on improving self-esteem.  At opening of group, members shared one of their proudest accomplishments, as well as the state of their current self-esteem.  Group was educated on definition of self-esteem, high and low self-esteem, Causes of low self-esteem, including past experiences, social media, isolation, health, other peoples viewpoints, relationships.  Group discussed the close knit relationship of low self-esteem and  mental health problems.  Group was then educated on ways to improve self-esteem, including doing tasks that provide enjoyment, work, hobbies, involvement in positive relationships, assertiveness, physical activity, sleep, diet, challenging self, positive thinking.  Group also acknowledged that we must determine the root cause of our negative beliefs and challenge those beliefs as a first step towards improving self-esteem.  Other tools the group learned were focusing on the positive,  fact checking, and learning self-compassion.  Group concluded with patient sharing one new strategy they will implement to build their self-esteem.   A:  Patient was engaged in group this date.  Patient shared that she will try to start walking in order to improve her self-esteem. P:  Continue skilled OT group 2 times per week for 3 weeks, next group session will focus on improving assertiveness for improved communication, self-esteem, and independence.                   OT Education - 01/11/17 [phone]    Education provided  Yes    Education Details  patient educated on causes of low self-esteem and strategies to improve self-esteem    Person(s) Educated  Patient    Methods  Explanation;Handout    Comprehension  Verbalized understanding       OT Short Term Goals - 01/11/2017 0957      OT SHORT TERM GOAL #1   Title  Patient will be educated on strategies to improve psychosocial skills needed to participate fully in all daily, work, and leisure activities.    Time  3    Period  Weeks    Status  New    Target Date  01/24/17      OT SHORT TERM GOAL #2   Title  Patient will be educated on a HEP and independent with implementation of HEP.    Time  3    Period  Weeks    Status  New      OT SHORT TERM GOAL #3   Title  Patient will independently apply psychosocial skills and coping mechanisms to her daily activities in order to function independently.    Time  3    Period  Weeks    Status  New               Plan - 01/11/2017 9528    Occupational performance deficits (Please refer to evaluation for details):  ADL's;IADL's;Rest and Sleep;Work;Leisure;Social Participation    Rehab Potential  Good    OT Frequency  2x / week    OT Duration  -- 3 weeks    OT Treatment/Interventions  Self-care/ADL training;Patient/family education;Other (comment) improve psycho social skills, coping skills, community reintegration    Clinical Decision Making  Limited treatment options, no  task modification necessary    Consulted and Agree with Plan of Care  Patient       Patient will benefit from skilled therapeutic intervention in order to improve the following deficits and impairments:  Other (comment)(decreased ability to complete ADLS, decreased coping skills ,decreased psychosocial skills )  Visit Diagnosis: 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 Bipolar 1 disorder, depressed, severe (HCC) 01/02/2017    Class: Chronic  . Affective psychosis, bipolar (HCC)   . Major depressive disorder, recurrent severe without psychotic features (HCC) 12/13/2016  . Overweight (BMI 25.0-29.9) 03/02/2016  . Personality disorder (HCC) 04/23/2013  . Bipolar 1 disorder, depressed (HCC) 09/29/2012  . Current every day smoker 02/17/2009  . Essential hypertension 02/17/2009    Sondra BargesMurray, Bethany AftonHelene 01/04/2017, 10:00 AM  [facility] [address] [place], [place], [address] Phone: [phone]   Fax:  [phone]  Name: Danielle Cox MRN: [id number] Date of Birth: 08-23-1960

## 2017-01-04 NOTE — Progress Notes (Signed)
Spiritual care group 01/02/2017 11:00-12:00  Facilitated by Wilkie Ayehaplain Matthew Stalnaker, MDiv   Group focused on topic of "self-care" Patients engaged in facilitated discussion about topic. Explored quotes related to self care and chose one which they agreed with and one which they disliked. Engaged in discussion around quote choices and their experience / understanding of care for themselves.   Danielle HatchetSheila was present through group with exception of stepping into hallway for 5 minutes. She returned to group.  Was periodically tearful with inward body language. Did not participate in activity. She did not participate in group discussion. Stepped out of group while another group member was sharing stating "I'm sorry, I can't do this." Facilitator reported to [employer] CorporationWhitney Cobia, who provided support with Danielle HatchetSheila outside of group room. When Danielle HatchetSheila returned to group, she apologized and stated she was having difficulty focusing at the moment. Facilitator offered affirmation. Danielle HatchetSheila did not engage further.  [facility] / [facility] Chaplain Burnis KingfisherMatthew Stalnaker, MDiv

## 2017-01-07 ENCOUNTER — Other Ambulatory Visit (HOSPITAL_COMMUNITY): Payer: Self-pay

## 2017-01-10 ENCOUNTER — Other Ambulatory Visit (HOSPITAL_COMMUNITY): Payer: Medicare Other | Admitting: Licensed Clinical Social Worker

## 2017-01-10 ENCOUNTER — Telehealth (HOSPITAL_COMMUNITY): Payer: Self-pay | Admitting: Professional

## 2017-01-10 ENCOUNTER — Other Ambulatory Visit (HOSPITAL_COMMUNITY): Payer: Medicare Other | Admitting: Occupational Therapy

## 2017-01-10 DIAGNOSIS — F314 Bipolar disorder, current episode depressed, severe, without psychotic features: Secondary | ICD-10-CM | POA: Diagnosis not present

## 2017-01-10 DIAGNOSIS — R4589 Other symptoms and signs involving emotional state: Secondary | ICD-10-CM

## 2017-01-10 DIAGNOSIS — F319 Bipolar disorder, unspecified: Secondary | ICD-10-CM

## 2017-01-10 NOTE — Progress Notes (Signed)
Progress note   No great change.  Still crying with absolutely no interest/ability to help find anything that might help herself.  Says that the sleeping pill only lasts 3 hours.  Plan is to try skipping the midday dose of gabapentin and take it with the night time dose along with the Rozerem to see if it helps her sleep longer.  The next step is her meeting with Dr Betti Cruzeddy to talk about the TMS and/or ketamine.  She expresses no interest in either.

## 2017-01-10 NOTE — Psych (Signed)
Individual Counseling 11:30-12:00:  Cln met with pt individually. Pt was very tearful. Pt reports she is struggling some with group when topics that she does not know anything about come up, such as social media. Pt reports that today it made her feel out of touch with the world and that she has been isolated for too long. Cln and pt discussed group being a safe space for her to practice talking to new people. Cln discussed addressing this in group by asking for someone to briefly explain a little about the topic so she was more informed and could start to make connections with others in group. Cln and pt discussed how pt has continued to do the same thing for years (sleeping, isolating) expecting a different outcome. Cln asked pt what her goal is for treatment and pt stated, "I want to feel better and have my own life." Cln discussed pt's willingness to come to group and try her best to participate is one way of creating change and working towards recovery. Cln and pt brainstormed other ideas for "baby-step improvements." Pt reports she is willing to try something new and worries about how she will get somewhere to "get a life" because she does not drive herself. Pt and cln discussed attending the [facility] support group for 1.5 hours on Saturdays. Pt reports she aligns with this idea and will discuss this with her husband and daughter tonight. Pt reports her husband will probably be able/willing to transport her on Saturdays. Pt reports she is willing to try the support group this Saturday, 01/12/17. Pt states she is not sure how to talk about it with her husband and daughter. Cln offered to call and speak with them this afternoon so they are aware she wants to discuss the option. Pt agreed with this option. Cln provided pt with a [facility] brochure so the family has all the information needed. Cln and pt discussed "taking credit" for the "baby steps" pt is taking, such as attending PHP and support group. Pt reports she  has struggled doing this and knows she needs to work on her self-esteem. This showed increased insight. Pt also discussed her fear of "being locked away" in a group home. Pt reports her husband continues to say he does not know what more to do with her and thinks a group home is the next step. Pt and cln discussed how taking "baby steps" can show husband improvement and that there are other options. Pt denies SI/HI/AVH at the end of individual session.  Loris Winrow, LPCA

## 2017-01-10 NOTE — Psych (Signed)
   [facility] THERAPIST PROGRESS [facility]  Danielle MartinezSheila K Mix [id number]  Session Time: 9 AM - 2PM  Participation Level: Minimal  Behavioral Response: CasualConfusedAnxious and Depressed  Type of Therapy: Group Therapy; Psychotherapy; Psychoeducation, Activity Therapy, Occupational Therapy  Treatment Goals addressed: Coping  Interventions: CBT, DBT, Supportive and Reframing  Summary:  9:00 - 10:30 Clinician led check-in regarding current stressors and situation, and review of patient completed daily inventory. Clinician utilized active listening and empathetic response and validated patient emotions. Clinician facilitated processing group on pertinent issues.  10:30 -11:30: OT Group  *11:30 - 12:15: Cln provided psychoeducation on the 4 communication styles. Group identified their personal style and worked on recognizing the presentation of each communication style.  12:15 - 1:00 Reflection group: Patients encouraged to practice skills and interpersonal techniques or work on mindfulness and relaxation techniques. The importance of self-care and making skills part of a routine to increase usage were stressed.  1:50- 1:50: Clinician introduced topic of "Communication - I Statements". Group discussed "I-Statements" and how using these instead of "you-statements" could be beneficial in their lives.  1:50 - 2:00 Clinician led check-out. Clinician assessed for immediate needs, medication compliance and efficacy, and safety concerns.  *Individual Counseling: 11:30-12:00  Suicidal/Homicidal: Nowithout intent/plan  Therapist Response: Danielle MartinezSheila K Lippman is a 56 y.o. female who presents with depression and anxiety symptoms. Patient arrived within time allowed and reports she is "struggling." Patient rates her mood at a 2 on a scale of 1-10 with 10 being great. Pt is largely uncommunicative during group, crying throughout most of the time. Pt held steady in the amount she participates in group.  Pt shares  that Christmas was hard for her because she wanted to stay in bed.  Pt reports she did sit with the family while they played games despite how hard it was.  Pt reports her daughter is leaving to go back to work tomorrow which scares her "because I'll be alone again."  Patient minimally engage in activity and discussion. Patient demonstrates some progress as evidenced by increased insight into the need to work on self-esteem. Patient denies SI/HI/self-harm at the end of group.   Plan: Pt will continue in PHP and medication management to address depression symptoms while working towards increased functioning AEB decreased crying spells, increased participation, and increased ability to self manage symptoms.   Diagnosis: Bipolar 1 disorder, depressed (HCC) [F31.9]    1. Bipolar 1 disorder, depressed (HCC)     Clotilda Hafer J Glender [place], LPCA 01/10/2017

## 2017-01-10 NOTE — Therapy (Signed)
[facility] [address] [place], [place], [address] Phone: [phone]   Fax:  [phone]  Occupational Therapy Treatment  Patient Details  Name: Danielle MartinezSheila K Cox MRN: [id number] Date of Birth: 1960/01/28 No Data Recorded  Encounter Date: 01/10/2017  OT End of Session - 01/10/17 1308    Visit Number  2    Number of Visits  6    Date for OT Re-Evaluation  01/24/17    Authorization Type  [employer] Medicare    Authorization Time Period  before 10th visit    Authorization - Visit Number  2    Authorization - Number of Visits  10    OT Start Time  1035    OT Stop Time  1140    OT Time Calculation (min)  65 min    Activity Tolerance  Patient tolerated treatment well    Behavior During Therapy  [facility] for tasks assessed/performed       Past Medical History:  Diagnosis Date  . Anxiety   . Bipolar disorder (HCC)   . Bronchitis, chronic (HCC)   . COPD (chronic obstructive pulmonary disease) (HCC)   . Depression   . Personality disorder [facility](HCC)     Past Surgical History:  Procedure Laterality Date  . CARPAL TUNNEL RELEASE Right   . CESAREAN SECTION  1983, 1988, 1992  . TONSILLECTOMY  age 56    There were no vitals filed for this visit.  Subjective Assessment - 01/10/17 1308    Currently in Pain?  No/denies           OT Group: Freight forwarderCommunication Skills-Active Listening and Open Discussion   S:  "I can't really concentrate to focus on this."   O:  Patient actively participated in the following skilled occupational therapy treatment session this date:             Social and communication skills: Pt participated in group geared towards assertiveness practice and appropriate communication skills/behavior. Pt engaged in discussion on body language and what is portrayed via various gestures and postures. Pt participated in group discussion of assertiveness and traits of passive, assertive, and aggressive individuals; then  participated in group activity of role playing each of the 3 aforementioned traits while trying to sell a new cell phone to an assertive/confident person. Group activity focusing on active listening and open communication via dialogue was completed next. Pt participated in small group/pair activity-participants given a subject to discuss. One person talks about the subject for 3 minutes while the other person practices active listening; then the listener recapped what the speaker said but did not include opinions or personal thoughts. Pairs/groups then switch roles for the next subject. At end of small group activity all participants reconvene for large group discussion on a potentially controversial topic. Today's topic was social media and whether it is good or bad. Pt was actively engaged during both small and large group activities and contributed to the discussion.    A:  Patient participated in skilled occupational therapy group for social and communication skills this date.  Patient was engaged during activities and provided insightful thoughts and feelings on session.      P:  Continue participation in skilled occupational therapy groups  1-2 times per week for 2 weeks in order to gain the necessary skills needed to return to full time community living and learn effective coping strategies to be a productive community resident. Next session: Stress management.  OT Short Term Goals - 01/10/17 1308      OT SHORT TERM GOAL #1   Title  Patient will be educated on strategies to improve psychosocial skills needed to participate fully in all daily, work, and leisure activities.    Time  3    Period  Weeks    Status  On-going      OT SHORT TERM GOAL #2   Title  Patient will be educated on a HEP and independent with implementation of HEP.    Time  3    Period  Weeks    Status  On-going      OT SHORT TERM GOAL #3   Title  Patient will independently apply psychosocial skills  and coping mechanisms to her daily activities in order to function independently.    Time  3    Period  Weeks    Status  On-going               Plan - 01/10/17 1308    OT Treatment/Interventions  Self-care/ADL training;Patient/family education;Other (comment) improve psycho social skills, coping skills, community reintegration    Clinical Decision Making  Limited treatment options, no task modification necessary    Consulted and Agree with Plan of Care  Patient       Patient will benefit from skilled therapeutic intervention in order to improve the following deficits and impairments:  Other (comment)(decreased ability to complete ADLS, decreased coping skills ,decreased psychosocial skills )  Visit Diagnosis: Bipolar 1 disorder, depressed, severe (HCC)  Difficulty coping    Problem List Patient Active Problem List   Diagnosis Date Noted  . Bipolar 1 disorder, depressed, severe (HCC) 01/02/2017    Class: Chronic  . Affective psychosis, bipolar (HCC)   . Major depressive disorder, recurrent severe without psychotic features (HCC) 12/13/2016  . Overweight (BMI 25.0-29.9) 03/02/2016  . Personality disorder (HCC) 04/23/2013  . Bipolar 1 disorder, depressed (HCC) 09/29/2012  . Current every day smoker 02/17/2009  . Essential hypertension 02/17/2009   Ezra SitesLeslie Garyson Stelly, OTR/L  [phone] 01/10/2017, 1:09 PM  [facility] [address] [place], [place], [address] Phone: [phone]   Fax:  [phone]  Name: Danielle MartinezSheila K Cox MRN: [id number] Date of Birth: [birth year]

## 2017-01-11 ENCOUNTER — Other Ambulatory Visit (HOSPITAL_COMMUNITY): Payer: Medicare Other | Admitting: Licensed Clinical Social Worker

## 2017-01-11 ENCOUNTER — Encounter (HOSPITAL_COMMUNITY): Payer: Self-pay

## 2017-01-11 VITALS — BP 118/70 | HR 87 | Ht 65.0 in | Wt 161.0 lb

## 2017-01-11 DIAGNOSIS — F314 Bipolar disorder, current episode depressed, severe, without psychotic features: Secondary | ICD-10-CM | POA: Diagnosis not present

## 2017-01-11 NOTE — Psych (Signed)
   [facility]  Monica MartinezSheila K Cox [id number]  Session Time: 9 AM - 1PM  Participation Level: Minimal  Behavioral Response: CasualConfusedAnxious and Depressed  Type of Therapy: Group Therapy; Psychotherapy; Psychoeducation, Activity Therapy  Treatment Goals addressed: Coping  Interventions: CBT, DBT, Supportive and Reframing  Summary:  9:00 - 10:30 Clinician led check-in regarding current stressors and situation, and review of patient completed daily inventory. Clinician utilized active listening and empathetic response and validated patient emotions. Clinician facilitated processing group on pertinent issues.  10:30 -11:15: Group did a distress tolerance workshop where the group practiced skills such as PMR.  11:15 - 12:00: Clinician continued with topic of "Communication - I Statements". Group practiced creating I-statements from situations sheet. Group discussed in what situations I-statements would be beneficial in their personal lives.  12:00 - 12:50: Clinician continued with topic of communication. Patients identified communication styles in short video clips. Patients identified their main form of communication and reasons why it works, or reasons to think about changing it.  12:50- 1:00: Clinician led check-out. Clinician assessed for immediate needs, medication compliance and efficacy, and safety concerns.    Suicidal/Homicidal: Nowithout intent/plan  Therapist Response: Monica MartinezSheila K Belkin is a 56 y.o. female who presents with depression and anxiety symptoms. ?Patient arrived within time allowed and reports she is feeling "more rested." Patient rates her mood at a 4 on a 1- 10 scale with 10 being great. Pt reports she did not nap yesterday which she believes helped her sleep better. Pt reports she spent some time with her husband and daughter, but felt hurt when they did not invite her to dinner with his racket ball friends. Pt reports she wants to start focusing  on working on her anxiety. Patient engaged minimally in activity and discussion. Patient demonstrates some progress as evidenced by participating the most she has in group to date. Patient denies SI/HI/self-harm thoughts at end of session.   Plan: Pt will continue in PHP and medication management to address depression symptoms while working towards increased functioning AEB decreased crying spells, increased participation, and increased ability to self manage symptoms.   Diagnosis: Bipolar 1 disorder, depressed, severe (HCC) [F31.4]    1. Bipolar 1 disorder, depressed, severe (HCC)     Austin Pongratz J [name], LPCA 01/11/2017

## 2017-01-11 NOTE — Progress Notes (Signed)
Patient presents with flat affect, depressed mood and crying often today as she admits to being very sad today that her daughter is returning to work at the [employer] as a Corporate treasurerlacrosse coach.  Patient denied any suicidal or homicidal ideations, no auditory or visual hallucinations and reports she did sleep a little better the previous night by getting up when she woke up and took her Rozerem.  Patient reported her current level of depression an 8, anxiety an 8, hopelessness an 8 on a scale of 0-10 with 0 being none and 10 the worst she could stand.  Patient admitted these were up even more today with her daughter leaving today and just admits when she is gone and her kids are away, she just worries often about all of them.  Patient states she has always done this but agrees to try distracting techniques when this starts happening to help with managing thoughts.   Patient agreed to speak with PHP therapist to make out a list of techniques she could try at home when these thoughts become more constant.  Patient again denied any suicidal or homicidal thoughts and reported only concern that she often feels her medication are making her sleepy during the day.  Patient thinks is may be more related to Tegretol medication than Clonazepam but agrees to discuss with Dr. Ladona Ridgelaylor in the coming week if keeps feeling day time drowsiness.  Patient returned to group today and will contact this nurse or PHP staff if any worsening of symptoms and states medication have helped some with overall depression and mood.

## 2017-01-14 ENCOUNTER — Other Ambulatory Visit (HOSPITAL_COMMUNITY): Payer: Medicare Other | Admitting: Licensed Clinical Social Worker

## 2017-01-14 DIAGNOSIS — F314 Bipolar disorder, current episode depressed, severe, without psychotic features: Secondary | ICD-10-CM | POA: Diagnosis not present

## 2017-01-15 ENCOUNTER — Ambulatory Visit (HOSPITAL_COMMUNITY): Payer: Self-pay

## 2017-01-16 ENCOUNTER — Other Ambulatory Visit (HOSPITAL_COMMUNITY): Payer: Medicare Other | Attending: Psychiatry | Admitting: Licensed Clinical Social Worker

## 2017-01-16 DIAGNOSIS — F314 Bipolar disorder, current episode depressed, severe, without psychotic features: Secondary | ICD-10-CM

## 2017-01-16 NOTE — Psych (Signed)
   [facility]  Danielle MartinezSheila K Meloni [id number]  Session Time: 9 AM - 2PM  Participation Level: Minimal  Behavioral Response: CasualConfusedAnxious and Depressed  Type of Therapy: Group Therapy; Psychotherapy; Psychoeducation, Activity Therapy; Medication group  Treatment Goals addressed: Coping  Interventions: CBT, DBT, Supportive and Reframing  Summary:  9:00 - 11:00: Pharmacist discussed medication with group and answered questions.  11:00 -12:15: Clinician led check-in regarding current stressors and situation, and review of patient completed daily inventory. Clinician utilized active listening and empathetic response and validated patient emotions. Clinician facilitated processing group on pertinent issues.  12:15 - 1:00 Reflection group: Patients encouraged to practice skills and interpersonal techniques or work on mindfulness and relaxation techniques. The importance of self-care and making skills part of a routine to increase usage were stressed.  1:00 - 1:50 Clinician introduced topic of "Vulnerability." Group watched "The Power of Vulnerability" TedTalk and discussed why being vulnerable is difficult and important.  1:50 - 2:00 Clinician led check-out. Clinician assessed for immediate needs, medication compliance and efficacy, and safety concerns.    Suicidal/Homicidal: Nowithout intent/plan  Therapist Response: Danielle MartinezSheila K Ewbank is a 57 y.o. female who presents with depression and anxiety symptoms. ?Patient arrived within time allowed and reports she is feeling "not good." Patient rates her mood at a 3 on a 1- 10 scale with 10 being great. Pt states she went to dinner with her family before daughter left town and it went well however she continues to feel disconnected around others. Pt reports concern about Wednesday when her husband returns to work.  Patient engaged minimally in activity and discussion. Patient demonstrates some progress as evidenced by  decreased tearfulness in group. Patient denies SI/HI/self-harm thoughts at end of session.   Plan: Pt will continue in PHP and medication management to address depression symptoms while working towards increased functioning AEB decreased crying spells, increased participation, and increased ability to self manage symptoms.   Diagnosis: Bipolar 1 disorder, depressed, severe (HCC) [F31.4]    1. Bipolar 1 disorder, depressed, severe (HCC)     Donia GuilesJenny Jacobus Colvin, LCSW 01/16/2017

## 2017-01-17 ENCOUNTER — Encounter (HOSPITAL_COMMUNITY): Payer: Self-pay | Admitting: Psychiatry

## 2017-01-17 ENCOUNTER — Other Ambulatory Visit (HOSPITAL_COMMUNITY): Payer: Medicare Other | Admitting: Specialist

## 2017-01-17 ENCOUNTER — Encounter (HOSPITAL_COMMUNITY): Payer: Self-pay

## 2017-01-17 ENCOUNTER — Other Ambulatory Visit (HOSPITAL_COMMUNITY): Payer: Medicare Other | Admitting: Licensed Clinical Social Worker

## 2017-01-17 VITALS — BP 120/64 | HR 79 | Ht 65.0 in | Wt 166.0 lb

## 2017-01-17 DIAGNOSIS — F319 Bipolar disorder, unspecified: Secondary | ICD-10-CM

## 2017-01-17 DIAGNOSIS — F314 Bipolar disorder, current episode depressed, severe, without psychotic features: Secondary | ICD-10-CM | POA: Diagnosis not present

## 2017-01-17 DIAGNOSIS — F0789 Other personality and behavioral disorders due to known physiological condition: Secondary | ICD-10-CM

## 2017-01-17 DIAGNOSIS — R4589 Other symptoms and signs involving emotional state: Secondary | ICD-10-CM

## 2017-01-17 NOTE — Progress Notes (Signed)
Patient presents with sad affect, depressed mood but not crying in episodes as much as she was when she started the Partial Hospitalization Program.  Patient reported her current level of depression at a 7, anxiety a 7 and hopelessness a 7 on a scale of 0-10 with 0 being none and 10 the worst she could manage.  Patient reviewed her medications with this nurse and reported still not sleeping well at night, up a down at times but tired a lot during the day.  Discussed with patient the way she is taking her Clonazepam and Hydroxyzine as both of these could be attributing to her daytime sleepiness.  Patient reported she was afraid not to take them due to she gets nervous and anxious but was open to discuss schedule for use.  Met with Dr. Lovena Le who approved patient to try taking her Clonazepam 1/2 in the morning, 1/2 tablet in the evening after getting home from [facility] and then a whole tablet at night prior to going to bed to help with sleep.  Patient reported her Hydroxyzine was filled as capsules so cannot cut these in 1/2 to try lower dosages but agreed with plan to try changes with Clonazepam for now.  If this does not help a lot then agreed to discuss more with Dr. Lovena Le to try reordering Hydroxyzine dosage to be tablets that she could possibly try cutting dosages since she is taking as needed currently, one in the morning, one in the evening and one at bedtime.  Patient denied any suicidal or homicidal ideations with no auditory or visual hallucinations.  Patient scored a 19 on her PHQ9 depression screening, only down 4 points since 01/03/17.  Patient reported still depressed about daughter now being back in [place] but remains committed to attending PHP and trying medication adjustments approved by Dr. Lovena Le to still help manage depression and anxiety the most.  Patient to contact this nurse if any other problems with medications or symptoms this week and agreed to check back in with this nurse in the  coming week to see if changes to dosage times for Clonazepam have helped.

## 2017-01-17 NOTE — Psych (Signed)
   [facility]  Danielle MartinezSheila K Julia [id number]  Session Time: 9 AM - 2PM  Participation Level: Minimal  Behavioral Response: CasualConfusedAnxious and Depressed  Type of Therapy: Group Therapy; Psychotherapy; Psychoeducation, Activity Therapy; Spiritual Care  Treatment Goals addressed: Coping  Interventions: CBT, DBT, Supportive and Reframing  Summary:  9:00 - 10:30 Clinician led check-in regarding current stressors and situation, and review of patient completed daily inventory. Clinician utilized active listening and empathetic response and validated patient emotions. Clinician facilitated processing group on pertinent issues.  10:30 -12:00 Spiritual care group  12:00 - 12:45 Reflection group: Patients encouraged to practice skills and interpersonal techniques or work on mindfulness and relaxation techniques. The importance of self-care and making skills part of a routine to increase usage were stressed.  12:45 - 1:50 Relaxation group: Cln Forde RadonLeanne Yates led yoga group focused on retraining the body's response to stress.  1:50 - 2:00 Clinician led check-out. Clinician assessed for immediate needs, medication compliance and efficacy, and safety concerns.    Suicidal/Homicidal: Nowithout intent/plan  Therapist Response: Danielle MartinezSheila K Cox is a 57 y.o. female who presents with depression and anxiety symptoms. ?Patient arrived within time allowed and reports she is feeling "the same." Patient rates her mood at a 3 on a 1- 10 scale with 10 being great. Pt states she went for a walk yesterday however "sat around" for the remainder of the evening. Pt shares she had fondue with her husband on New Year's Eve and he made a comment that hurt her feelings. Pt shares she feels she is taking one step forward and one step back. Patient engaged minimally in activity and discussion. Patient demonstrates some progress as evidenced by getting out of the house. Patient denies SI/HI/self-harm  thoughts at end of session.   Plan: Pt will continue in PHP and medication management to address depression symptoms while working towards increased functioning AEB decreased crying spells, increased participation, and increased ability to self manage symptoms.   Diagnosis: Bipolar 1 disorder, depressed, severe (HCC) [F31.4]    1. Bipolar 1 disorder, depressed, severe (HCC)     Donia GuilesJenny Steph Cheadle, LCSW 01/17/2017

## 2017-01-17 NOTE — Therapy (Signed)
[facility] [address] [place], [place], [address] Phone: [phone]   Fax:  [phone]  Occupational Therapy Treatment  Patient Details  Name: Danielle MartinezSheila K Mangini MRN: [id number] Date of Birth: [birth year] No Data Recorded  Encounter Date: 01/17/2017  OT End of Session - 01/17/17 1321    Visit Number  3    Number of Visits  6    Date for OT Re-Evaluation  01/24/17    Authorization Type  [employer] Medicare    Authorization Time Period  n/a    Authorization - Visit Number  0    Authorization - Number of Visits  0    OT Start Time  1030    OT Stop Time  1130    OT Time Calculation (min)  60 min    Activity Tolerance  Patient tolerated treatment well    Behavior During Therapy  [facility] for tasks assessed/performed       Past Medical History:  Diagnosis Date  . Anxiety   . Bipolar disorder (HCC)   . Bronchitis, chronic (HCC)   . COPD (chronic obstructive pulmonary disease) (HCC)   . Depression   . Personality disorder [facility](HCC)     Past Surgical History:  Procedure Laterality Date  . CARPAL TUNNEL RELEASE Right   . CESAREAN SECTION  1983, 1988, 1992  . TONSILLECTOMY  age 57    There were no vitals filed for this visit.  Subjective Assessment - 01/17/17 1321    Currently in Pain?  No/denies       S:  I get stressed out when I am home alone.     O:  Skilled OT group focused on stress management this date.  Group opened with members identifying 6 stressful situations they have encountered.  Group weighed in on their personal level of stress with each situation.  Group discussed what causes varying levels of stress for the same situation.  Group discussed  recurrent physical and psychological stress can diminish self-esteem, decrease interpersonal and academic effectiveness and create a cycle of self-blame and self-doubt.  Group was educated on importance of identifying symptoms of stress, events are not stressful but rather our  interpretations and reactions to event that are stressful.  Group member took a stress symptom checklist and discussed results.  Group discussed poor coping mechanisms including self medication, suppression, passivity, acting out, blaming/complaining), why it may work short term, as well as possible long term complications.  Group discussed and learned positive coping mechanisms to stress including: relaxation, positive mental attitude, support, self-care and lifestyle changes, interpersonal strategies, emotional strategies, cognitive strategies, and philosophical strategies.  Group member was given handouts with 150 suggestions to decrease and manage stress, and member was able to voice 1 new strategy they will implement to manage stress.  A:  Shelia engaged in group this date.  Danielle Cox was able to identify stressors and symptoms of stress in her life.  Danielle Cox agreed to ask question in order to reduce stress.  P: Continue skilled OT group focusing on time management skills.                   OT Education - 01/17/17 1321    Education provided  Yes    Education Details  patient educated on 150 stress reducing activities     Person(s) Educated  Patient    Methods  Explanation;Handout    Comprehension  Verbalized understanding       OT Short  Term Goals - 01/10/17 1308      OT SHORT TERM GOAL #1   Title  Patient will be educated on strategies to improve psychosocial skills needed to participate fully in all daily, work, and leisure activities.    Time  3    Period  Weeks    Status  On-going      OT SHORT TERM GOAL #2   Title  Patient will be educated on a HEP and independent with implementation of HEP.    Time  3    Period  Weeks    Status  On-going      OT SHORT TERM GOAL #3   Title  Patient will independently apply psychosocial skills and coping mechanisms to her daily activities in order to function independently.    Time  3    Period  Weeks    Status  On-going                  Patient will benefit from skilled therapeutic intervention in order to improve the following deficits and impairments:  (decreased ability to complete ADLs, decreased coping skills, decreased psychosocial skills )  Visit Diagnosis: Bipolar 1 disorder, depressed (HCC)  Difficulty coping  Other personality and behavioral disorders due to known physiological condition    Problem List Patient Active Problem List   Diagnosis Date Noted  . Bipolar 1 disorder, depressed, severe (HCC) 01/02/2017    Class: Chronic  . Affective psychosis, bipolar (HCC)   . Major depressive disorder, recurrent severe without psychotic features (HCC) 12/13/2016  . Overweight (BMI 25.0-29.9) 03/02/2016  . Personality disorder (HCC) 04/23/2013  . Bipolar 1 disorder, depressed (HCC) 09/29/2012  . Current every day smoker 02/17/2009  . Essential hypertension 02/17/2009    Shirlean Mylar, MHA, OTR/L [phone]  01/17/2017, 1:22 PM  [facility] HOSPITAL[facility] PRO[facility] [address] [place], [place], [address] Phone: [phone]   Fax:  [phone]  Name: Danielle Cox MRN: [id number] Date of Birth: [birth year]

## 2017-01-17 NOTE — Progress Notes (Signed)
Progress note      Ms Danielle Cox says she does not feel any better.  Still depressed, has daily headaches, still feels tired and hopeless.  She cried only once when talking about her daughter's departure.  Does take care of her son's dog and that is a little bit positive and talks to him daily.  Still has a UTI she believes which is being treated currently.  My time is about to run out she said indicating the end of the [facility].  She said she is not learning anything because she cannot remember anything but just coming to the group seems a little helpful. Plan is to continue medications as they are and daily group therapy as nothing else has worked either. Try clonazepam 1/2 tab bid and 1 tab hs to help with sleep as suggested by the nurse evaluator.

## 2017-01-18 ENCOUNTER — Telehealth (HOSPITAL_COMMUNITY): Payer: Self-pay | Admitting: Professional

## 2017-01-18 ENCOUNTER — Other Ambulatory Visit (HOSPITAL_COMMUNITY): Payer: Medicare Other | Admitting: Licensed Clinical Social Worker

## 2017-01-18 DIAGNOSIS — F314 Bipolar disorder, current episode depressed, severe, without psychotic features: Secondary | ICD-10-CM | POA: Diagnosis not present

## 2017-01-18 NOTE — Telephone Encounter (Signed)
Cln called pt's husband to discuss next steps. Pt discussed IOP with husband last night and said she would go. Husband is concerned about cost of programs. Cln told husband he would have to call the insurance company to find out what they would cover. Cln let husband know that pt is covered for PHP with insurance through next Wednesday (01/23/17) but that could be extended a few days. Husband also discussed concern about transportation due to relying on neighbors to get pt to and from group at this time. Cln discussed the use of Uber type services or calling a medical transport service as options. Husband states he would check into all of this. Husband also reports (as did pt) that pt will not be in group on Monday due to a doctor's appt. Cln told husband she would talk with pt more about next steps on Tuesday and could call him with information, if pt consents, after that discussion.  Husband was very appreciative of the communication. Cln told husband to call back if he had any further questions/concerns.

## 2017-01-18 NOTE — Psych (Signed)
Individual Session 1:30-2:00  Pt was somewhat tearful during individual but less so than previous individuals. Pt reports she did attend the [facility] support group last Saturday. Pt reports she plans on going again this Saturday "if my husband will take me." Pt is still struggling to see the improvement she has made. Cln and pt spent time discussing pt's actions are her choice and she deserves credit for the actions she takes. Pt reports she is concerned about recent weight gain and thinks it may be connected to her medication. Cln encouraged pt to speak to Dr. Ladona Ridgelaylor and Ines BloomerShawn (nurse) about the concern. Pt reports her husband wants to know when her last day in PHP is. Cln and pt discussed options, such as next Wednesday and then stepping down to IOP. Pt reports she is not sure about going to IOP because she struggles to connect with people in group. Pt reports she is interested in going back to individual counseling. Pt states she cannot make a decision today. Pt states her husband keeps asking her when her last day will be. Cln offered to call and explain options to pt's husband. Pt agreed. Pt denies SI/HI/AVH at the end of individual session.  Danielle Cox, LPCA

## 2017-01-18 NOTE — Psych (Signed)
   [facility] PHP THERAPIST PROGRESS NOTE  Danielle MartinezSheila K Davia [id number]  Session Time: 9 AM - 1PM  Participation Level: Minimal  Behavioral Response: CasualConfusedAnxious and Depressed  Type of Therapy: Group Therapy; Psychotherapy; Psychoeducation  Treatment Goals addressed: Coping  Interventions: CBT, DBT, Supportive and Reframing  Summary:  9:00 - 10:30 Clinician led check-in regarding current stressors and situation, and review of patient completed daily inventory. Clinician utilized active listening and empathetic response and validated patient emotions. Clinician facilitated processing group on pertinent issues.  10:30 -12:00 Clinician led a relationship workshop. Patients had the chance todiscuss current relationship issues and work together to come up with possible solutions.  12:00 - 12:50 Group watched "The Person You Really Need to Marry" Ted-Talk. Group discussed how accepting/not accepting self can affect relationships with others.  12:50 - 1:00 Clinician led check-out. Clinician assessed for immediate needs, medication compliance and efficacy, and safety concerns.    Suicidal/Homicidal: Nowithout intent/plan  Therapist Response: Danielle MartinezSheila K Alsobrook is a 57 y.o. female who presents with depression and anxiety symptoms. ?Patient arrived within time allowed and reports she is feeling "frustrated about my meds." Patient rates her mood at a 3 on a 1- 10 scale with 10 being great. Pt reports she had no motivation to do anything last night but went for a walk anyways.  Pt states she felt lonely while her husband was out with friends.  Pt reports she is starting to consider getting a pet for companionship. Pt reports she plans on attending the [facility] support group again this Saturday. Patient engaged minimally in activity and discussion. Patient demonstrates some progress as evidenced by using skills despite lack of motivation. Patient denies SI/HI/self-harm thoughts at end of session.   Plan:  Pt will continue in PHP and medication management to address depression symptoms while working towards increased functioning AEB decreased crying spells, increased participation, and increased ability to self manage symptoms.   Diagnosis: Bipolar 1 disorder, depressed, severe (HCC) [F31.4]    1. Bipolar 1 disorder, depressed, severe (HCC)     Lovely Kerins J Mory Herrman, LPCA 01/18/2017

## 2017-01-18 NOTE — Psych (Signed)
   [facility] THERAPIST PROGRESS [facility]  Danielle MartinezSheila K Cox [id number]  Session Time: 9 AM - 2PM  Participation Level: Minimal  Behavioral Response: CasualConfusedAnxious and Depressed  Type of Therapy: Group Therapy; Psychotherapy; Psychoeducation, Activity Therapy; Art Therapy  Treatment Goals addressed: Coping  Interventions: CBT, DBT, Supportive and Reframing  Summary:  9:00 -10:30: Clinician led check-in regarding current stressors and situation, and review of patient completed daily inventory. Clinician utilized active listening and empathetic response and validated patient emotions. Clinician facilitated processing group on pertinent issues.  10:30 - 11:30: OT Group  11:30 - 12:15: Clinician introduced topic of "Goals". Group discussed why having goals is important. Group completed a goals worksheet to help narrow down steps to working towards a goal.  12:15 - 1:00: Reflection group: Patients encouraged to practice skills and interpersonal techniques or work on mindfulness and relaxation techniques. The importance of self-care and making skills part of a routine to increase usage were stressed.  1:00-1:50: Art Therapy: The group created individual vision boards related to goals for 2019.  1:50 - 2:00 Clinician led check-out. Clinician assessed for immediate needs, medication compliance and efficacy, and safety concerns. *Individual Counseling 1:30-2:00    Suicidal/Homicidal: Nowithout intent/plan  Therapist Response: Danielle MartinezSheila K Cox is a 57 y.o. female who presents with depression and anxiety symptoms. ?Patient arrived within time allowed and reports she is feeling "down still." Patient rates her mood at a 3 on a 1- 10 scale with 10 being great. Pt states she had dinner with her husband, took a 1 hour nap, and then was restless throughout the night. Pt shared that her husband is concerned about the number of days she has left in PHP. Patient engaged minimally in activity and  discussion. Patient demonstrates some progress as evidenced by sharing more in group without being directly prompted. Patient denies SI/HI/self-harm thoughts at end of session.   Plan: Pt will continue in PHP and medication management to address depression symptoms while working towards increased functioning AEB decreased crying spells, increased participation, and increased ability to self manage symptoms.   Diagnosis: Bipolar 1 disorder, depressed (HCC) [F31.9]    1. Bipolar 1 disorder, depressed (HCC)     Carmin Alvidrez J Lemont Sitzmann, LPCA 01/18/2017

## 2017-01-21 ENCOUNTER — Other Ambulatory Visit (HOSPITAL_COMMUNITY): Payer: Self-pay

## 2017-01-22 ENCOUNTER — Other Ambulatory Visit (HOSPITAL_COMMUNITY): Payer: Medicare Other | Admitting: Occupational Therapy

## 2017-01-22 ENCOUNTER — Other Ambulatory Visit (HOSPITAL_COMMUNITY): Payer: Medicare Other | Admitting: Licensed Clinical Social Worker

## 2017-01-22 ENCOUNTER — Telehealth (HOSPITAL_COMMUNITY): Payer: Self-pay | Admitting: Professional

## 2017-01-22 DIAGNOSIS — F314 Bipolar disorder, current episode depressed, severe, without psychotic features: Secondary | ICD-10-CM

## 2017-01-22 DIAGNOSIS — F0789 Other personality and behavioral disorders due to known physiological condition: Secondary | ICD-10-CM

## 2017-01-22 DIAGNOSIS — R4589 Other symptoms and signs involving emotional state: Secondary | ICD-10-CM

## 2017-01-22 NOTE — Psych (Signed)
Individual Session 1:15-1:45  Cln met with pt to discuss discharge plan. Pt states she is still unsure of what she wants to do; go to IOP or straight to individual counseling. Pt reports thinking about it causes her a lot of anxiety and makes her physically ill. Pt reports she also worries about being able to get to and from group for much longer due to relying on neighbors to bring her. Cln and pt spent time discussing pros and cons of going to IOP vs. not, such as pt being able to have something in her schedule and continue to make some progress and pt's worry about not being able to relate to people in the group setting. Pt states, "I just don't know what to do. I have relied on Shanon Brow (husband) for so long I have paralyzed myself." Cln asked pt what would help her make a decision, and pt decided if she could have another night to think about it and talk with cln in the morning and come to a decision then. Pt also discussed her concerns about her medication making her feel sedated and how her psychiatrist did not change any of her medications yesterday. Cln encouraged pt to talk with Dr. Lovena Le about concerns tomorrow when she meets with him. Pt reports she went to the [facility] support group on Saturday. Pt states she will continue to go if her husband will take her, though she does not speak while there. Cln and pt spent time discussing how progress can take many paths and often is slower than we would like, but even some progress is progress. Pt states she knows she has made some progress but she is not where she would hope to be. Cln encouraged pt to continue to work on giving herself credit for the progress she has made since being admitted into the hospital and into PHP. Pt reports she is going to call and make an apt to meet with Dr. Reece Levy about [place]. Pt denies SI/HI/AVH at the end of session.  Ahlana Slaydon, LPCA

## 2017-01-22 NOTE — Therapy (Signed)
[facility] PARTIAL HOSPITALIZATION PROGRAM [address] [place], [place], [address] Phone: [phone]   Fax:  [phone]  Occupational Therapy Treatment  Patient Details  Name: Danielle Cox MRN: [id number] Date of Birth: 05-24-1960 No Data Recorded  Encounter Date: 01/22/2017  OT End of Session - 01/22/17 1548    Visit Number  4    Number of Visits  6    Date for OT Re-Evaluation  01/24/17    Authorization Type  [employer] Medicare    Authorization Time Period  n/a    Authorization - Visit Number  0    Authorization - Number of Visits  0    OT Start Time  1030    OT Stop Time  1130    OT Time Calculation (min)  60 min    Activity Tolerance  Patient tolerated treatment well    Behavior During Therapy  [facility] for tasks assessed/performed       Past Medical History:  Diagnosis Date  . Anxiety   . Bipolar disorder (HCC)   . Bronchitis, chronic (HCC)   . COPD (chronic obstructive pulmonary disease) (HCC)   . Depression   . Personality disorder [facility])     Past Surgical History:  Procedure Laterality Date  . CARPAL TUNNEL RELEASE Right   . CESAREAN SECTION  1983, 1988, 1992  . TONSILLECTOMY  age 45    There were no vitals filed for this visit.  Subjective Assessment - 01/22/17 1548    Currently in Pain?  No/denies           OT Treatment Group: Sleep Hygiene   S:  "I sleep too much." O:  Patient actively participated in the following skilled occupational therapy treatment session this date: Sleep hygiene - Began session with sleep hygiene questionnaire. Pt completed questionnaire and participated in discussion. Group then played Sleep Hygiene Taboo game, pt actively participated and engaged in game. Discussed importance of a routine leading up to bedtime, and educated on factors influencing sleep. Patient marginally engaged during session. Pt participated in group discussion of factors that interfere with sleep and lifestyle factors promoting  sleep. Discussed importance of exercise for the body's metabolism and promoting healthy sleep, as well as foods that can help or hinder sleep.  A:  Patient participated in skilled occupational therapy group for sleep hygiene skills this date.  Patient was actively engaged during session and appears open to strategies introduced.  P:  Continue participation in skilled occupational therapy groups  1-2 times per week in order to gain the necessary skills needed to return to full time community living and learn effective coping strategies to be a productive community resident. Next session: time management      OT Short Term Goals - 01/10/17 1308      OT SHORT TERM GOAL #1   Title  Patient will be educated on strategies to improve psychosocial skills needed to participate fully in all daily, work, and leisure activities.    Time  3    Period  Weeks    Status  On-going      OT SHORT TERM GOAL #2   Title  Patient will be educated on a HEP and independent with implementation of HEP.    Time  3    Period  Weeks    Status  On-going      OT SHORT TERM GOAL #3   Title  Patient will independently apply psychosocial skills and coping mechanisms to  her daily activities in order to function independently.    Time  3    Period  Weeks    Status  On-going                 Patient will benefit from skilled therapeutic intervention in order to improve the following deficits and impairments:  (decreased ability to complete ADLs, decreased coping skills, decreased psychosocial skills )  Visit Diagnosis: Bipolar 1 disorder, depressed, severe (HCC)  Difficulty coping  Other personality and behavioral disorders due to known physiological condition    Problem List Patient Active Problem List   Diagnosis Date Noted  . Bipolar 1 disorder, depressed, severe (HCC) 01/02/2017    Class: Chronic  . Affective psychosis, bipolar (HCC)   . Major depressive disorder, recurrent severe without  psychotic features (HCC) 12/13/2016  . Overweight (BMI 25.0-29.9) 03/02/2016  . Personality disorder (HCC) 04/23/2013  . Bipolar 1 disorder, depressed (HCC) 09/29/2012  . Current every day smoker 02/17/2009  . Essential hypertension 02/17/2009   Ezra SitesLeslie Carmina Walle, OTR/L  [phone] 01/22/2017, 3:48 PM  [facility] PART[facility] PROGRAM [address] [place], [place], [address] Phone: [phone]   Fax:  [phone]  Name: Danielle Cox MRN: [id number] Date of Birth: April 17, 1960

## 2017-01-23 ENCOUNTER — Other Ambulatory Visit (HOSPITAL_COMMUNITY): Payer: Medicare Other | Admitting: Professional

## 2017-01-23 DIAGNOSIS — F3130 Bipolar disorder, current episode depressed, mild or moderate severity, unspecified: Secondary | ICD-10-CM

## 2017-01-23 NOTE — Progress Notes (Signed)
Progress note   Ms Danielle Cox says she feels no better though she looks better than she was.  No crying, more eye contact walks faster, talks faster.  Still complains of dizziness, tiredness and sleepiness.  The change in how clonazepam is given has helped.  She is planning to come to IOP.       Plan is to decrease the hydroxyzine to 25 mg q 6 hrs as needed for anxiety.  She may take 50 mg hs.  [facility] and [facility] to IOP for further treatment.

## 2017-01-24 ENCOUNTER — Other Ambulatory Visit (HOSPITAL_COMMUNITY): Payer: Medicare Other | Admitting: Specialist

## 2017-01-24 ENCOUNTER — Other Ambulatory Visit (HOSPITAL_COMMUNITY): Payer: Medicare Other | Admitting: Licensed Clinical Social Worker

## 2017-01-24 DIAGNOSIS — F314 Bipolar disorder, current episode depressed, severe, without psychotic features: Secondary | ICD-10-CM | POA: Diagnosis not present

## 2017-01-24 DIAGNOSIS — F0789 Other personality and behavioral disorders due to known physiological condition: Secondary | ICD-10-CM

## 2017-01-24 DIAGNOSIS — F3130 Bipolar disorder, current episode depressed, mild or moderate severity, unspecified: Secondary | ICD-10-CM

## 2017-01-24 DIAGNOSIS — R4589 Other symptoms and signs involving emotional state: Secondary | ICD-10-CM

## 2017-01-24 NOTE — Psych (Signed)
   [facility] THERAPIST PROGRESS NOTE  Monica MartinezSheila K Lodwick [id number]  Session Time: 9 AM - 2 PM  Participation Level: Minimal  Behavioral Response: CasualLethargicAnxious and Depressed  Type of Therapy: Group Therapy; Psychotherapy; Psychoeducation; Activity Therapy; OT  Treatment Goals addressed: Coping  Interventions: CBT, DBT, Supportive and Reframing  Summary:  9:00 - 10:30 Clinician led check-in regarding current stressors and situation, and review of patient completed daily inventory. Clinician utilized active listening and empathetic response and validated patient emotions. Clinician facilitated processing group on pertinent issues.  10:30 - 11:30: OT  11:30-12:15: Group discussed how to work Psychologist, educationallogic into [employer]our mindset when feelings are making it difficult.  12:15 -1:00: Reflection group: Patients encouraged to practice skills and interpersonal techniques or work on mindfulness and relaxation techniques. The importance of self-care and making skills part of a routine to increase usage were stressed.  1:00 - 1:50: Clinician introduced topic of "Boundaries". Group discussed the different types of boundaries (porous, rigid, healthy), different environments where boundaries may differ, and the different categories of boundaries.  1:50 - 2:00 Clinician led check-out. Clinician assessed for immediate needs, medication compliance and efficacy, and safety concerns.    Suicidal/Homicidal: Nowithout intent/plan  Therapist Response: Monica MartinezSheila K Wheatley is a 57 y.o. female who presents with depression and anxiety symptoms. ?Patient arrived within time allowed and reports she is feeling "frustrated." Patient rates her mood at a 3on a 1- 10 scale with 10 being great. Pt shares she continues to struggle with energy, focus, and concentration. Pt reports she did spend time with a pet yesterday as she was encouraged to do. Patient engaged minimally in activity and discussion. Patient demonstrates some progress  as evidenced by reporting she was able to focus "a little" on a tennis match. Patient denies SI/HI/self-harm thoughts at end of session.   Plan: Pt will continue in PHP and medication management to address depression symptoms while working towards increased functioning AEB decreased crying spells, increased participation, and increased ability to self manage symptoms.   Diagnosis: Bipolar I disorder, most recent episode depressed (HCC) [F31.30]    1. Bipolar I disorder, most recent episode depressed (HCC)     Donia GuilesJenny Titianna Loomis, LCSW 01/24/2017

## 2017-01-24 NOTE — Psych (Signed)
   [facility] THERAPIST PROGRESS NOTE  Danielle MartinezSheila K Cox [id number]  Session Time: 9 AM - 2 PM  Participation Level: Minimal  Behavioral Response: CasualConfusedAnxious and Depressed  Type of Therapy: Group Therapy; Psychotherapy; Activity Therapy; Spiritual Care; Relaxation Group  Treatment Goals addressed: Coping  Interventions: CBT, DBT, Supportive and Reframing  Summary:  9:00 - 10:30 Clinician led check-in regarding current stressors and situation, and review of patient completed daily inventory. Clinician utilized active listening and empathetic response and validated patient emotions. Clinician facilitated processing group on pertinent issues.  10:30 -12:00 Spiritual care group  12:00 - 12:45 Reflection group: Patients encouraged to practice skills and interpersonal techniques or work on mindfulness and relaxation techniques. The importance of self-care and making skills part of a routine to increase usage were stressed.  12:45 - 1:50 Relaxation group: Cln Forde RadonLeanne Yates led yoga group focused on retraining the body's response to stress.  1:50 - 2:00 Clinician led check-out. Clinician assessed for immediate needs, medication compliance and efficacy, and safety concerns.    Suicidal/Homicidal: Nowithout intent/plan  Therapist Response: Danielle MartinezSheila K Staheli is a 57 y.o. female who presents with depression and anxiety symptoms. ?Patient arrived within time allowed and reports she is feeling "paralyzed with fear." Patient rates her mood at a 3 on a 1- 10 scale with 10 being great. [facility]. Pt reports she is struggling to give herself credit for the progress she has made. Patient engaged minimally in activity and discussion. Patient demonstrates some progress as evidenced by not crying during the entire group and increase in strength of voice. Patient denies SI/HI/self-harm thoughts at end of session.   Plan: Pt will continue in PHP and  medication management to address depression symptoms while working towards increased functioning AEB decreased crying spells, increased participation, and increased ability to self manage symptoms.   Diagnosis: Bipolar I disorder, most recent episode depressed (HCC) [F31.30]    1. Bipolar I disorder, most recent episode depressed (HCC)     Rahmon Heigl J Haston Casebolt, LPCA 01/24/2017

## 2017-01-24 NOTE — Psych (Signed)
   [facility]  Danielle MartinezSheila K Cox [id number]  Session Time: 9 AM - 2 PM  Participation Level: Minimal  Behavioral Response: CasualConfusedAnxious and Depressed  Type of Therapy: Group Therapy; Psychotherapy; Psychoeducation; Activity Therapy; OT  Treatment Goals addressed: Coping  Interventions: CBT, DBT, Supportive and Reframing  Summary:  9:00 - 10:30 Clinician led check-in regarding current stressors and situation, and review of patient completed daily inventory. Clinician utilized active listening and empathetic response and validated patient emotions. Clinician facilitated processing group on pertinent issues.  10:30 -11:30: OT Group  11:30 - 12:00: Clinician led discussion on control and how to recognize what you can and cannot control. Pt's discussed working on acceptance over what they cannot control.  12:00-12:50: Reflection group: Patients encouraged to practice skills and interpersonal techniques or work on mindfulness and relaxation techniques. The importance of self-care and making skills part of a routine to increase usage were stressed.  12:50 - 1:50 Clinician introduced topic of fear. Group watched "Why You Should Define Your Fears Instead of Your Goals" Ted-Talk. Patients discussed fear setting and how to utilize it as a skill.  1:50 - 2:00 Clinician led check-out. Clinician assessed for immediate needs, medication compliance and efficacy, and safety concerns.    Suicidal/Homicidal: Nowithout intent/plan  Therapist Response: Danielle MartinezSheila K Cox is a 57 y.o. female who presents with depression and anxiety symptoms. ?Patient arrived within time allowed and reports she is feeling "feeling disconnected." Patient rates her mood at a 3 on a 1- 10 scale with 10 being great. Pt shares feeling like a "zombie" and being frustrated that her doctor did not alter her medications. Pt shares she feels her husband is frustrated that she is not getting better. Pt reports  she did attend the [facility] support group on Saturday. Patient engaged minimally in activity and discussion. Patient demonstrates some progress as evidenced by decreased tearfulness and increased strength of voice. Patient denies SI/HI/self-harm thoughts at end of session.   Plan: Pt will continue in PHP and medication management to address depression symptoms while working towards increased functioning AEB decreased crying spells, increased participation, and increased ability to self manage symptoms.   Diagnosis: Bipolar 1 disorder, depressed, severe (HCC) [F31.4]    1. Bipolar 1 disorder, depressed, severe (HCC)     Donia GuilesJenny Paighton Godette, LCSW 01/24/2017

## 2017-01-25 ENCOUNTER — Other Ambulatory Visit (HOSPITAL_COMMUNITY): Payer: Medicare Other | Admitting: Licensed Clinical Social Worker

## 2017-01-25 ENCOUNTER — Other Ambulatory Visit: Payer: Self-pay

## 2017-01-25 ENCOUNTER — Encounter (HOSPITAL_COMMUNITY): Payer: Self-pay | Admitting: Specialist

## 2017-01-25 VITALS — BP 122/78 | HR 79 | Ht 65.0 in | Wt 162.0 lb

## 2017-01-25 DIAGNOSIS — F314 Bipolar disorder, current episode depressed, severe, without psychotic features: Secondary | ICD-10-CM

## 2017-01-25 NOTE — Psych (Signed)
   [facility] THERAPIST PROGRESS NOTE  Monica MartinezSheila K Cutler [id number]  Session Time: 9 AM - 1 PM  Participation Level: Minimal  Behavioral Response: CasualLethargicAnxious and Depressed  Type of Therapy: Group Therapy; Psychotherapy; Psychoeducation; Activity Therapy  Treatment Goals addressed: Coping  Interventions: CBT, DBT, Supportive and Reframing  Summary: 9:00 - 10:30 Clinician led check-in regarding current stressors and situation, and review of patient completed daily inventory. Clinician utilized active listening and empathetic response and validated patient emotions. Clinician facilitated processing group on pertinent issues.  10:30-11:15: Clinician led group discussion on scheduling and ways to keep from isolating self.  11:15 - 12:00: Clinician continued with topic of "Boundaries" from the previous day. Clinician introduced topic of how to set and maintain boundaries. Group identified areas in life where boundaries need to be examined and changed.  12:00 - 12:50 Group viewed TED talk "100 days of Rejection" and clinician led discussion on ways in which fear of rejection can hold us back.  12:50 - 1:00 Clinician led check-out. Clinician assessed for immediate needs, medication compliance and efficacy, and safety concerns.   Suicidal/Homicidal: Nowithout intent/plan  Therapist Response: Monica MartinezSheila K Cox is a 57 y.o. female who presents with depression and anxiety symptoms. ?Patient arrived within time allowed and reports she is feeling "still really depressed." Patient rates her mood at a 3on a 1- 10 scale with 10 being great. Pt shares she is nervous about today being the last day in PHP and spending time alone at home. Group spent time coming up with ideas of how time could be filled with positive/prosocial activities.  Patient engaged minimally in activity and discussion. Patient demonstrates some progress as evidenced by volunteering information about the [facility] support group when not  prompted. Patient denies SI/HI/self-harm thoughts at end of session.   Plan: Patient will discharge from PHP due to meeting treatment goals of depression symptoms while working towards increased functioning AEB decreased crying spells, increased participation, and increased ability to self manage symptoms. Progress was measured by observation, self-report, and scales. Psychiatrist has approved discharge and patient reports alignment with discharge plan. Patient will step down to IOP within this agency. Patient will begin IOP on 01/29/17. Patient denies any SI/HI at time of discharge.    Diagnosis: Bipolar 1 disorder, depressed, severe (HCC) [F31.4]    1. Bipolar 1 disorder, depressed, severe (HCC)     Carlisle Torgeson J Alfred Eckley, LPCA 01/25/2017

## 2017-01-25 NOTE — Progress Notes (Signed)
Patient presents with flat affect, depressed mood and admitted to feeling more anxious today related to [facility].  States she does not feel she has gotten a lot out of groups but does want to stepdown and try IOP but is concerned about transportation.  Patient discussed recent medications to help with daily sedation but to still manage high anxiety as she reports changes in Clonazepam have helped and is sleeping better at night.  Reviewed additional changes Dr. Ladona Ridgelaylor suggested 01/23/17 with patient's Hydroxyzine as she reported she had not picked up lower 25 mg dosage yet from her pharmacy.  [facility] for patient to pick up and then reviewed plan for patient to take a 25 mg in the morning, and others every 6 hours after if needed but could also still take 50 mg at bedtime, per Dr. Lubertha Basqueaylor's instruction to help with night time sleep and anxiety.  Patient agreed with plan to pick up medications today and to try changes.  Discussed with her plans for the coming weekend as patient reported fear she would just lay down a lot and not get a lot done and then her husband would be upset with her.  Patient agreed her husband is supportive and understanding at this time but will try medication changes to see if this helps her energy level and mood.  Patient rated her current depression an 8, anxiety a 10 and hopelessness an 8 on a scale of 0-10 with 0 being none and 10 the worst she could manage.  Patient denied any suicidal or homicidal ideations but just admits anxiousness related to ending PHP but does plan to try to workout attending IOP.  Patient still scored a 19 on her PHQ9 depression screening and Dr. Ladona Ridgelaylor will continue to monitor symptoms and medication changes once transitioned to IOP.  [facility] with no further complaints or concerns reported.  Denied any other side effects to medications and will contact this nurse if any  worsening of symptoms.  Patient also encouraged to report in the coming week if medication changes help with energy and motivation but also If continues to help her anxiety at a lower dosage.

## 2017-01-25 NOTE — Therapy (Addendum)
[place], [place], [address] Phone: [phone]   Fax:  [phone]  Occupational Therapy Treatment and Discharge  Patient Details  Name: ROBERTA ANGELL MRN: [id number] Date of Birth: Jan 03, 1961 No Data Recorded  Encounter Date: 01/24/2017  OT End of Session - 01/25/17 1034    Visit Number  5    Number of Visits  6    Date for OT Re-Evaluation  01/24/17    Authorization Type  [employer] Medicare    OT Start Time  1030    OT Stop Time  1130    OT Time Calculation (min)  60 min    Activity Tolerance  Patient tolerated treatment well    Behavior During Therapy  [facility] for tasks assessed/performed       Past Medical History:  Diagnosis Date  . Anxiety   . Bipolar disorder ([place])   . Bronchitis, chronic ([place])   . COPD (chronic obstructive pulmonary disease) ([place])   . Depression   . Personality disorder [facility])     Past Surgical History:  Procedure Laterality Date  . CARPAL TUNNEL RELEASE Right   . [place]  . TONSILLECTOMY  age 57    There were no vitals filed for this visit.  Subjective Assessment - 01/25/17 1033    Currently in Pain?  No/denies    Pain Score  0-No pain            S:  I still have too much time and nothing to do. O:  Patient participated in skilled OT group focusing on time management this date.  Group consisted of warm up activity relating how you would spend $86,400 if you had it and couldn't have any left over at end of day - as we have 86,400 seconds in a day and need to spend them wisely.  Group discussed if time can be managed vs activities in a day being managed.  Group was educated on effects of good time management ( improved focus, decision making, success, decreased stress, increased free time) as well as effects of inefficient time management ( inefficiency, increased stress, and poor quality work).  Group discussed common time thieves and  possible solutions, and finished with education on tips to improve time management ( know how you currently spend time, set priorities, use a scheduling tool, schedule time appropriately, get organized, delegate, avoid procrastination, manage time wasters, avoid multitasking, stay healthy).  Group then participated in a puzzle activity in which they were given puzzle pieces without the large finished picture and related back to how this is similar to not having a larger picture of a structured day.  Group concluded with members identifying one new time management strategy they plan to implement into daily routine.  A:  [place] was reserved during group session.  She feels aimless due to too much time on her hands.  She is open to writing down 1-3 to do items each day to give some sort of loose framework to her day, at recommendation of therapist.  P: Continue skilled OT group intervention with focus of next group on financial management strategies.                  OT Education - 01/25/17 1033    Education provided  No    Education Details  patient educated on time management strategies    Person(s) Educated  Patient    Methods  Explanation;Handout    Comprehension  Verbalized understanding       OT Short Term Goals - 01/10/17 1308      OT SHORT TERM GOAL #1   Title  Patient will be educated on strategies to improve psychosocial skills needed to participate fully in all daily, work, and leisure activities.    Time  3    Period  Weeks    Status  On-going      OT SHORT TERM GOAL #2   Title  Patient will be educated on a HEP and independent with implementation of HEP.    Time  3    Period  Weeks    Status  On-going      OT SHORT TERM GOAL #3   Title  Patient will independently apply psychosocial skills and coping mechanisms to her daily activities in order to function independently.    Time  3    Period  Weeks    Status  On-going               Plan - 01/25/17  1034    OT Treatment/Interventions  Self-care/ADL training;Patient/family education;Other (comment) coping skills and psychosocial skills training     Consulted and Agree with Plan of Care  Patient       Patient will benefit from skilled therapeutic intervention in order to improve the following deficits and impairments:  (decreased ability to complete ADLS, decreased psychosocial skills, decreased coping skills )  Visit Diagnosis: Difficulty coping  Other personality and behavioral disorders due to known physiological condition    Problem List Patient Active Problem List   Diagnosis Date Noted  . Bipolar 1 disorder, depressed, severe ([place]) 01/02/2017    Class: Chronic  . Affective psychosis, bipolar ([place])   . Major depressive disorder, recurrent severe without psychotic features ([place]) 12/13/2016  . Overweight (BMI 25.0-29.9) 03/02/2016  . Personality disorder ([place]) 04/23/2013  . Bipolar 1 disorder, depressed ([place]) 09/29/2012  . Current every day smoker 02/17/2009  . Essential hypertension 02/17/2009    Vangie Bicker, Artem[place], OTR/L [phone]  01/25/2017, 10:36 AM  [place], [place], [address] Phone: [phone]   Fax:  [phone]  Name: THREASA KINCH MRN: [id number] Date of Birth: 19-Aug-1960   OCCUPATIONAL THERAPY DISCHARGE SUMMARY  Visits from Start of Care: 5  Current functional level related to goals / functional outcomes: Pt is transferring to IOP as stepdown from PHP. Pt was minimally participatory in 5/6 OT group sessions.    Remaining deficits: Difficulty implementing learned strategies.    Education / Equipment: Strategies/techniques/HEP for attended sessions Plan: Patient agrees to discharge.  Patient goals were not met. Patient is being discharged due to the physician's request.  ?????    Transferring to IOP

## 2017-01-28 ENCOUNTER — Other Ambulatory Visit (HOSPITAL_COMMUNITY): Payer: Medicare Other

## 2017-01-28 ENCOUNTER — Other Ambulatory Visit (HOSPITAL_COMMUNITY): Payer: Medicare Other | Admitting: Psychiatry

## 2017-01-28 ENCOUNTER — Encounter (HOSPITAL_COMMUNITY): Payer: Self-pay

## 2017-01-28 ENCOUNTER — Encounter (HOSPITAL_COMMUNITY): Payer: Self-pay | Admitting: Psychiatry

## 2017-01-28 DIAGNOSIS — F314 Bipolar disorder, current episode depressed, severe, without psychotic features: Secondary | ICD-10-CM

## 2017-01-28 NOTE — Progress Notes (Signed)
    Daily Group Progress Note  Program: IOP  Group Time: 9:00-12:00  Participation Level: Active  Behavioral Response: Appropriate  Type of Therapy:  Group Therapy  Summary of Progress: Pt. Presented as very tearful, depressed, poor eye contact. Pt. Met with case manager and psychiatrist for intake assessment. Pt. Shared with the group that she had been depressed for most of her life and that she felt very lonely and isolated. Pt. Shared that she she was able to listen to the group, but did not feel able to otherwise participated in the discussion. Pt. Was present during medication education management group facilitated by the pharmacist.     Nancie Neas, [facility]

## 2017-01-28 NOTE — Progress Notes (Signed)
Danielle MartinezSheila K Cox is a 57 y.o., married, Caucasian, unemployed, female; [facility].    Pt attended PHP from December 2018 until 01-25-17.  [facility]  Pt reported she took "about 100 pills" to try to kill herself before going into the hospital.  Pt has been in and out of the hospital numerous times over the past 30 years but was unable to give a clear number of times.  Pt states she lives at home with her husband but they do not spend much time together.  Pt reports she has an "OK" relationship with her kids but feels like she can't share everything with them. Pt reports she is struggling with depression and has for 30+ years.   Daughter had reported mother was diagnosed with Bipolar 1 30 years ago but had symptoms for most of her life.  Mentioned that  mother's last manic episode was in 2012. Pt reports her depression symptoms have gotten much worse lately.  Pt states she felt "desperate, lonely, and isolated" when she tried to kill herself about 2 weeks ago.  Pt states her typical day includes laying in bed all day.  Pt reports she isolates, struggles with concentration and memory, and feels hopeless.  Pt had ECT treatments (could not state when) and states they did not help.  Apparently, patient was referred for TMS but was unable to go to the assessment because she was in the hospital.  Pt was seeing an individual therapist but did not talk when she went "because I didn't have anything to talk about."  Pt sees Rickey PrimusLisa Polus, NP for psychiatry.  Pt has been on disability for "about 6 years" due to bipolar diagnosis.  Pt denies any substance use (other than nicotine) at this time. Pt denies any AVH. Passive SI.  Discussed safety options at length.  Pt denies a plan or intent; able to contract for safety.  Pt states she does not know what will help her and is feeling hopeless about it all.  A:  Oriented pt to MH-IOP.  Provided pt with an  orientation folder.  Informed Danielle SeatsLisa Poulus, NP of transition.  Will inquire from pt, if she is currently under the services of a therapist.  Encouraged support groups.  R:  Pt receptive.        Chestine SporeLARK, RITA, M.Ed, CNA

## 2017-01-28 NOTE — Progress Notes (Signed)
Patient ID: Danielle MartinezSheila K Cox, female   DOB: [birth year], 57 y.o.   MRN: [id number]   Transfer note  Ms Danielle Cox [facility].  She remains depressed and close to non functional but is not crying all day, is trying to get out and exercise and did have friends over during the weekend.  She says she feels no better but seems better to those of us around her.  Admits her husband is right to say even when she is not depressed she has no interests or hobbies.  She devoted herself to raising the children and since they left she has been even more depressed than usual.  She wants to be back on an antidepressant.  The reduced hydroxyzine is working okay.  She is concerned the clonazepam is addictive.    Mental status reveals severe depression with no suicidal thoughts but severe hopelessness.  No manic symptoms  Plan is to continueTegretolXR 600 mg bid, clonazepam 1 mg bid, hydroxyzine 25-50 mg q 6 hrs prn, Rozerem 8 mg hs and to add buproprion XL 150 mg daily with warning about increasing anxiety and triggering mania though it has the best history with bipolar patients.

## 2017-01-29 ENCOUNTER — Other Ambulatory Visit (HOSPITAL_COMMUNITY): Payer: Self-pay

## 2017-01-29 ENCOUNTER — Other Ambulatory Visit (HOSPITAL_COMMUNITY): Payer: Medicare Other

## 2017-01-29 ENCOUNTER — Ambulatory Visit (HOSPITAL_COMMUNITY): Payer: Self-pay

## 2017-01-29 MED ORDER — BUPROPION HCL ER (XL) 150 MG PO TB24: 150.0000 mg | ORAL_TABLET | ORAL | 2 refills | Status: DC

## 2017-01-30 ENCOUNTER — Other Ambulatory Visit (HOSPITAL_COMMUNITY): Payer: Self-pay

## 2017-01-30 ENCOUNTER — Other Ambulatory Visit (HOSPITAL_COMMUNITY): Payer: Medicare Other | Admitting: Psychiatry

## 2017-01-30 DIAGNOSIS — F314 Bipolar disorder, current episode depressed, severe, without psychotic features: Secondary | ICD-10-CM | POA: Diagnosis not present

## 2017-01-31 ENCOUNTER — Other Ambulatory Visit (HOSPITAL_COMMUNITY): Payer: Medicare Other

## 2017-01-31 ENCOUNTER — Other Ambulatory Visit (HOSPITAL_COMMUNITY): Payer: Self-pay

## 2017-01-31 ENCOUNTER — Ambulatory Visit (HOSPITAL_COMMUNITY): Payer: Self-pay

## 2017-02-01 ENCOUNTER — Other Ambulatory Visit (HOSPITAL_COMMUNITY): Payer: Medicare Other | Admitting: Psychiatry

## 2017-02-01 ENCOUNTER — Other Ambulatory Visit (HOSPITAL_COMMUNITY): Payer: Self-pay

## 2017-02-01 NOTE — Progress Notes (Signed)
Patient ID: Danielle MartinezSheila K [place], female   DOB: [birth year], 57 y.o.   MRN: [id number]  [facility]  Patient:  Danielle MartinezSheila K Marano DOB:  [birth year]  Date of Admission: 01/28/2017  Date of Discharge: 02/01/2017 Reason for Admission:[facility] for severely intractable depression with little progress there.  IOP Course:attended 2 days but called today to say that the group was too large and she could not tolerate the discomfort of so many people.  She did seem slightly better on her last day but not remarkably so and said she did not feel any better.  Mental Status at Discharge:no suicidal thoughts but still severely depresed  Diagnosis:   Level of Care:  IOP  Discharge destination:    Follow-up recommendations:  She will call her therapist and provider for appointments  Comments:  No great progress in PHP or IOP  The patient received suicide prevention pamphlet:  Yes   Carolanne GrumblingGerald Kymoni Lesperance, MD

## 2017-02-01 NOTE — Progress Notes (Signed)
Danielle MartinezSheila K Cox is a 57 y.o. , married, Caucasian, unemployed, female; who [facility].    Pt attended PHP from December 2018 until 01-25-17.  [facility]  Pt reported she took "about 100 pills" to try to kill herself before going into the hospital. Pt has been in and out of the hospital numerous times over the past 30 years but was unable to give a clear number of times. Pt states she lives at home with her husband but they do not spend much time together. Pt reports she has an "OK" relationship with her kids but feels like she can't share everything with them. Pt reports she is struggling with depression and has for 30+ years. Daughter had reported mother was diagnosed with Bipolar 1 30 years ago but had symptoms for most of her life. Mentioned that  mother's last manic episode was in 2012. Pt reports her depression symptoms have gotten much worse lately. Pt states she felt "desperate, lonely, and isolated" when she tried to kill herself about 2 weeks ago. Pt states her typical day includes laying in bed all day. Pt reports she isolates, struggles with concentration and memory, and feels hopeless. Pt had ECT treatments (could not state when) and states they did not help. Apparently, patient was referred for TMS but was unable to go to the assessment because she was in the hospital. Pt was seeing an individual therapist but did not talk when she went "because I didn't have anything to talk about." Pt sees Rickey PrimusLisa Polus, NP for psychiatry. Pt has been on disability for "about 6 years" due to bipolar diagnosis. Pt denies any substance use (other than nicotine) at this time. Pt continues to deny any AVH. Passive SI still..  Discussed safety options at length.  Pt denies a plan or intent; able to contract for safety.  Pt states she does not know what will help her and is feeling hopeless about it all.   Pt only attended two days since  starting IOP.  She called this morning stating that she wouldn't be returning due to the groups being too large.  "The group just isn't for me."  A:  Instructed pt to call for f/u appt with Jake SeatsLisa Poulus, NP.  Encouraged support groups.  R:  Pt receptive.      Chestine SporeLARK, RITA, M.Ed, CNA

## 2017-02-04 ENCOUNTER — Other Ambulatory Visit (HOSPITAL_COMMUNITY): Payer: Medicare Other

## 2017-02-05 ENCOUNTER — Other Ambulatory Visit (HOSPITAL_COMMUNITY): Payer: Medicare Other

## 2017-02-06 ENCOUNTER — Other Ambulatory Visit (HOSPITAL_COMMUNITY): Payer: Medicare Other

## 2017-02-06 NOTE — Progress Notes (Signed)
    Daily Group Progress Note Program: IOP Group Time: 9:00-12:00 Participation Level: Minimal Behavioral Response: Appropriate Type of Therapy:  Group Summary of Progress: Group session was focused on normalizing experienced anger and distinguishing it with the other feelings associated with the anger. Session also focused on being how to become more comfortable with unpleasant emotions in order to develop productive emotional regulation. Lastly, topics of self care was addressed in terms of positively coping with negative life situations. Pt. appeared very isolated and disengaged throughout the whole therapy session. When gently confronted, she expressed not being able to relate to anyone. Afterwards, she became overwhelmed and broke down by crying in front of group members. Some members provided sympathy and understanding towards her as she received positive feedback about making a lot of progress. Resumed disengaged for the remainder of the group session.  Shaune PollackBrown, Chriss Redel B, LPC

## 2017-02-07 ENCOUNTER — Other Ambulatory Visit (HOSPITAL_COMMUNITY): Payer: Medicare Other

## 2017-02-08 ENCOUNTER — Other Ambulatory Visit (HOSPITAL_COMMUNITY): Payer: Medicare Other

## 2017-02-11 ENCOUNTER — Other Ambulatory Visit (HOSPITAL_COMMUNITY): Payer: Medicare Other

## 2017-02-12 ENCOUNTER — Other Ambulatory Visit (HOSPITAL_COMMUNITY): Payer: Medicare Other

## 2017-02-13 ENCOUNTER — Other Ambulatory Visit (HOSPITAL_COMMUNITY): Payer: Medicare Other

## 2017-02-14 ENCOUNTER — Other Ambulatory Visit (HOSPITAL_COMMUNITY): Payer: Medicare Other

## 2017-02-15 ENCOUNTER — Other Ambulatory Visit (HOSPITAL_COMMUNITY): Payer: Medicare Other

## 2017-02-18 ENCOUNTER — Other Ambulatory Visit (HOSPITAL_COMMUNITY): Payer: Medicare Other

## 2017-02-19 ENCOUNTER — Other Ambulatory Visit (HOSPITAL_COMMUNITY): Payer: Medicare Other

## 2017-02-20 ENCOUNTER — Other Ambulatory Visit (HOSPITAL_COMMUNITY): Payer: Medicare Other

## 2017-02-21 ENCOUNTER — Other Ambulatory Visit (HOSPITAL_COMMUNITY): Payer: Medicare Other

## 2017-02-22 ENCOUNTER — Other Ambulatory Visit (HOSPITAL_COMMUNITY): Payer: Medicare Other

## 2017-02-25 ENCOUNTER — Other Ambulatory Visit (HOSPITAL_COMMUNITY): Payer: Medicare Other

## 2017-02-26 ENCOUNTER — Other Ambulatory Visit (HOSPITAL_COMMUNITY): Payer: Medicare Other

## 2017-02-27 ENCOUNTER — Other Ambulatory Visit (HOSPITAL_COMMUNITY): Payer: Medicare Other

## 2017-02-28 ENCOUNTER — Other Ambulatory Visit (HOSPITAL_COMMUNITY): Payer: Medicare Other

## 2017-03-01 ENCOUNTER — Other Ambulatory Visit (HOSPITAL_COMMUNITY): Payer: Medicare Other

## 2017-03-04 ENCOUNTER — Other Ambulatory Visit (HOSPITAL_COMMUNITY): Payer: Medicare Other

## 2017-03-05 ENCOUNTER — Other Ambulatory Visit (HOSPITAL_COMMUNITY): Payer: Medicare Other

## 2017-03-06 ENCOUNTER — Other Ambulatory Visit (HOSPITAL_COMMUNITY): Payer: Medicare Other

## 2017-03-07 ENCOUNTER — Other Ambulatory Visit (HOSPITAL_COMMUNITY): Payer: Medicare Other

## 2017-03-08 ENCOUNTER — Other Ambulatory Visit (HOSPITAL_COMMUNITY): Payer: Medicare Other

## 2017-03-11 ENCOUNTER — Other Ambulatory Visit (HOSPITAL_COMMUNITY): Payer: Medicare Other

## 2017-03-12 ENCOUNTER — Other Ambulatory Visit (HOSPITAL_COMMUNITY): Payer: Medicare Other

## 2017-03-13 ENCOUNTER — Other Ambulatory Visit (HOSPITAL_COMMUNITY): Payer: Medicare Other

## 2017-03-14 ENCOUNTER — Other Ambulatory Visit (HOSPITAL_COMMUNITY): Payer: Medicare Other

## 2017-03-15 ENCOUNTER — Other Ambulatory Visit (HOSPITAL_COMMUNITY): Payer: Medicare Other

## 2017-03-18 ENCOUNTER — Other Ambulatory Visit (HOSPITAL_COMMUNITY): Payer: Medicare Other

## 2017-03-19 ENCOUNTER — Other Ambulatory Visit (HOSPITAL_COMMUNITY): Payer: Medicare Other

## 2017-03-20 ENCOUNTER — Other Ambulatory Visit (HOSPITAL_COMMUNITY): Payer: Medicare Other

## 2017-03-21 ENCOUNTER — Other Ambulatory Visit (HOSPITAL_COMMUNITY): Payer: Medicare Other

## 2017-03-22 ENCOUNTER — Other Ambulatory Visit (HOSPITAL_COMMUNITY): Payer: Medicare Other

## 2017-04-30 ENCOUNTER — Ambulatory Visit: Payer: Medicare Other | Admitting: Family Medicine

## 2017-05-06 NOTE — Progress Notes (Signed)
Danielle Cox is a 57 y.o. female is here for follow up.  History of Present Illness:   HPI: Patient presents for a 1+ week history of worsening sinus pain and pressure, associated with cough and chest congestion.  She is a smoker.  She has tried over-the-counter medications without relief of her symptoms.  She feels very tired.  No fevers.  History of severe depression.  Patient states that it has been getting worse.  She denies suicidal thoughts.  Of note, she was hospitalized several months ago for a Valium overdose.  She states that she has no energy and sits around all day long.  She does not have follow-up with her therapist.  She was started on Paxil 3 weeks ago.  Followed by [facility].  Health Maintenance Due  Topic Date Due  . Hepatitis C Screening  04/17/1962  . HIV Screening  06/09/1977  . TETANUS/TDAP  06/09/1981   No flowsheet data found.   PMHx, SurgHx, SocialHx, FamHx, Medications, and Allergies were reviewed in the Visit Navigator and updated as appropriate.   Patient Active Problem List   Diagnosis Date Noted  . Affective psychosis, bipolar (HCC)   . Major depressive disorder, recurrent severe without psychotic features (HCC) 12/13/2016  . Bipolar 1 disorder, depressed, severe (HCC) 11/20/2016    Class: Chronic  . Overweight (BMI 25.0-29.9) 03/02/2016  . Personality disorder (HCC) 04/23/2013  . Current every day smoker 02/17/2009  . Essential hypertension 02/17/2009   Social History   Tobacco Use  . Smoking status: Current Every Day Smoker    Packs/day: 1.00    Years: 40.00    Pack years: 40.00    Types: Cigarettes  . Smokeless tobacco: Never Used  Substance Use Topics  . Alcohol use: No  . Drug use: No   Current Medications and Allergies:   .  carbamazepine (TEGRETOL XR) 200 MG 12 hr tablet, Take 3 tablets (600 mg total) by mouth 2 (two) times daily. For mood stability, Disp: 180 tablet, Rfl: 0 .  clonazePAM (KLONOPIN) 1 MG tablet, Take 1 tablet  (1 mg total) by mouth 2 (two) times daily. For anxiety, Disp: 60 tablet, Rfl: 0 .  gabapentin (NEURONTIN) 100 MG capsule, Take 2 capsules (200 mg total) by mouth 3 (three) times daily., Disp: 180 capsule, Rfl: 0 .  hydrOXYzine (ATARAX/VISTARIL) 50 MG tablet, Take 1 tablet (50 mg total) by mouth every 6 (six) hours as needed for anxiety., Disp: 90 tablet, Rfl: 1 .  ramelteon (ROZEREM) 8 MG tablet, Take 1 tablet (8 mg total) by mouth at bedtime. For sleep, Disp: 30 tablet, Rfl: 0   Allergies  Allergen Reactions  . Moxifloxacin Hcl Palpitations    REACTION: tackycardia  . Penicillins Rash    Has patient had a PCN reaction causing immediate rash, facial/tongue/throat swelling, SOB or lightheadedness with hypotension: Yes Has patient had a PCN reaction causing severe rash involving mucus membranes or skin necrosis: No Has patient had a PCN reaction that required hospitalization: No Has patient had a PCN reaction occurring within the last 10 years: No If all of the above answers are "NO", then may proceed with Cephalosporin use.    Review of Systems   Pertinent items are noted in the HPI. Otherwise, ROS is negative.  Vitals:   Vitals:   05/07/17 1300  BP: 118/78  Pulse: 92  Temp: 98.9 F (37.2 C)  TempSrc: Oral  SpO2: 96%  Weight: 161 lb 6.4 oz (73.2 kg)  Body mass index is 26.86 kg/m.   Physical Exam:   Physical Exam  Constitutional: She is oriented to person, place, and time. She appears well-developed and well-nourished. No distress.  HENT:  Head: Normocephalic and atraumatic.  Right Ear: External ear normal.  Left Ear: External ear normal.  Nose: Mucosal edema and rhinorrhea present. Right sinus exhibits maxillary sinus tenderness. Left sinus exhibits maxillary sinus tenderness.  Mouth/Throat: Oropharynx is clear and moist.  Eyes: Pupils are equal, round, and reactive to light. Conjunctivae and EOM are normal.  Neck: Normal range of motion. Neck supple. No thyromegaly  present.  Cardiovascular: Normal rate, regular rhythm, normal heart sounds and intact distal pulses.  Pulmonary/Chest: Effort normal. She has wheezes. She has rhonchi.  Abdominal: Soft. Bowel sounds are normal.  Musculoskeletal: Normal range of motion.  Lymphadenopathy:    She has no cervical adenopathy.  Neurological: She is alert and oriented to person, place, and time.  Skin: Skin is warm and dry. Capillary refill takes less than 2 seconds.  Psychiatric: She is slowed. She exhibits a depressed mood. She expresses no homicidal and no suicidal ideation.  Nursing note and vitals reviewed.   Assessment and Plan:   Diagnoses and all orders for this visit:  Cough -     azithromycin (ZITHROMAX) 250 MG tablet; 2 on day one, then one po daily until gone. -     budesonide-formoterol (SYMBICORT) 160-4.5 MCG/ACT inhaler; Inhale 2 puffs into the lungs 2 (two) times daily.  Bipolar 1 disorder, depressed, severe (HCC) Comments: No SI. Will call Psychologist to get into early. Offered to check in next week. Offered PT to get her out of the house.  Malaise and fatigue -     CBC with Differential/Platelet -     Comprehensive metabolic panel -     TSH -     Ferritin -     Vitamin B12   . Reviewed expectations re: course of current medical issues. . Discussed self-management of symptoms. . Outlined signs and symptoms indicating need for more acute intervention. . Patient verbalized understanding and all questions were answered. Marland Kitchen. Health Maintenance issues including appropriate healthy diet, exercise, and smoking avoidance were discussed with patient. . See orders for this visit as documented in the electronic medical record. . Patient received an After Visit Summary.  Helane RimaErica Teng Decou, DO [place], [facility] 05/07/2017  No future appointments.

## 2017-05-07 ENCOUNTER — Ambulatory Visit: Payer: Medicare Other | Admitting: Family Medicine

## 2017-05-07 ENCOUNTER — Encounter: Payer: Self-pay | Admitting: Family Medicine

## 2017-05-07 VITALS — BP 118/78 | HR 92 | Temp 98.9°F | Wt 161.4 lb

## 2017-05-07 DIAGNOSIS — R059 Cough, unspecified: Secondary | ICD-10-CM

## 2017-05-07 DIAGNOSIS — F314 Bipolar disorder, current episode depressed, severe, without psychotic features: Secondary | ICD-10-CM | POA: Diagnosis not present

## 2017-05-07 DIAGNOSIS — R5383 Other fatigue: Secondary | ICD-10-CM

## 2017-05-07 DIAGNOSIS — R05 Cough: Secondary | ICD-10-CM | POA: Diagnosis not present

## 2017-05-07 DIAGNOSIS — R5381 Other malaise: Secondary | ICD-10-CM | POA: Diagnosis not present

## 2017-05-07 LAB — CBC WITH DIFFERENTIAL/PLATELET
Basophils Absolute: 0.1 10*3/uL (ref 0.0–0.1)
Basophils Relative: 0.8 % (ref 0.0–3.0)
Eosinophils Absolute: 0.2 10*3/uL (ref 0.0–0.7)
Eosinophils Relative: 1.9 % (ref 0.0–5.0)
HCT: 46.4 % — ABNORMAL HIGH (ref 36.0–46.0)
Hemoglobin: 15.5 g/dL — ABNORMAL HIGH (ref 12.0–15.0)
Lymphocytes Relative: 20.7 % (ref 12.0–46.0)
Lymphs Abs: 1.7 10*3/uL (ref 0.7–4.0)
MCHC: 33.5 g/dL (ref 30.0–36.0)
MCV: 90.4 fl (ref 78.0–100.0)
Monocytes Absolute: 0.7 10*3/uL (ref 0.1–1.0)
Monocytes Relative: 7.7 % (ref 3.0–12.0)
Neutro Abs: 5.8 10*3/uL (ref 1.4–7.7)
Neutrophils Relative %: 68.9 % (ref 43.0–77.0)
Platelets: 307 10*3/uL (ref 150.0–400.0)
RBC: 5.14 Mil/uL — ABNORMAL HIGH (ref 3.87–5.11)
RDW: 16 % — ABNORMAL HIGH (ref 11.5–15.5)
WBC: 8.4 10*3/uL (ref 4.0–10.5)

## 2017-05-07 LAB — COMPREHENSIVE METABOLIC PANEL
ALT: 34 U/L (ref 0–35)
AST: 20 U/L (ref 0–37)
Albumin: 4.1 g/dL (ref 3.5–5.2)
Alkaline Phosphatase: 96 U/L (ref 39–117)
BUN: 15 mg/dL (ref 6–23)
CO2: 32 mEq/L (ref 19–32)
Calcium: 9.6 mg/dL (ref 8.4–10.5)
Chloride: 104 mEq/L (ref 96–112)
Creatinine, Ser: 0.71 mg/dL (ref 0.40–1.20)
GFR: 90.87 mL/min (ref >60.00)
Glucose, Bld: 79 mg/dL (ref 70–99)
Potassium: 4.9 mEq/L (ref 3.5–5.1)
Sodium: 141 mEq/L (ref 135–145)
Total Bilirubin: 0.2 mg/dL (ref 0.2–1.2)
Total Protein: 6.4 g/dL (ref 6.0–8.3)

## 2017-05-07 LAB — FERRITIN: Ferritin: 6.7 ng/mL — ABNORMAL LOW (ref 10.0–291.0)

## 2017-05-07 LAB — VITAMIN B12: Vitamin B-12: 749 pg/mL (ref 211–911)

## 2017-05-07 LAB — TSH: TSH: 1.01 u[IU]/mL (ref 0.35–4.50)

## 2017-05-07 MED ORDER — AZITHROMYCIN 250 MG PO TABS: ORAL_TABLET | ORAL | 0 refills | Status: DC

## 2017-05-07 MED ORDER — BUDESONIDE-FORMOTEROL FUMARATE 160-4.5 MCG/ACT IN AERO: 2.0000 | INHALATION_SPRAY | Freq: Two times a day (BID) | RESPIRATORY_TRACT | 3 refills | Status: DC

## 2017-05-15 ENCOUNTER — Ambulatory Visit: Payer: Self-pay

## 2017-05-15 ENCOUNTER — Encounter: Payer: Self-pay | Admitting: Family Medicine

## 2017-05-15 ENCOUNTER — Ambulatory Visit: Payer: Medicare Other | Admitting: Family Medicine

## 2017-05-15 VITALS — BP 114/76 | HR 82 | Temp 98.4°F | Ht 65.0 in | Wt 161.4 lb

## 2017-05-15 DIAGNOSIS — R059 Cough, unspecified: Secondary | ICD-10-CM

## 2017-05-15 DIAGNOSIS — J3489 Other specified disorders of nose and nasal sinuses: Secondary | ICD-10-CM | POA: Diagnosis not present

## 2017-05-15 DIAGNOSIS — R05 Cough: Secondary | ICD-10-CM

## 2017-05-15 MED ORDER — PREDNISONE 5 MG PO TABS: ORAL_TABLET | ORAL | 0 refills | Status: DC

## 2017-05-15 MED ORDER — RANITIDINE HCL 300 MG PO TABS: 300.0000 mg | ORAL_TABLET | Freq: Every day | ORAL | 0 refills | Status: DC

## 2017-05-15 MED ORDER — ONDANSETRON 4 MG PO TBDP: 4.0000 mg | ORAL_TABLET | Freq: Three times a day (TID) | ORAL | 0 refills | Status: DC | PRN

## 2017-05-15 NOTE — Telephone Encounter (Signed)
Patient has app today  

## 2017-05-15 NOTE — Telephone Encounter (Signed)
Pt. Reports she has continued sinus pain,pressure and nasal stuffiness after finishing antibiotic. Reports she feels like the Zithromax caused nausea that she still has. Denies any fever or other symptoms. Appointment made for today.  Reason for Disposition . [1] Nasal discharge AND [2] present > 10 days  Answer Assessment - Initial Assessment Questions 1. LOCATION: "Where does it hurt?"      Facial pain 2. ONSET: "When did the sinus pain start?"  (e.g., hours, days)      Started 2 weeks ago 3. SEVERITY: "How bad is the pain?"   (Scale 1-10; mild, moderate or severe)   - MILD (1-3): doesn't interfere with normal activities    - MODERATE (4-7): interferes with normal activities (e.g., work or school) or awakens from sleep   - SEVERE (8-10): excruciating pain and patient unable to do any normal activities        5 4. RECURRENT SYMPTOM: "Have you ever had sinus problems before?" If so, ask: "When was the last time?" and "What happened that time?"      Yes 5. NASAL CONGESTION: "Is the nose blocked?" If so, ask, "Can you open it or must you breathe through the mouth?"     Nose is blocked 6. NASAL DISCHARGE: "Do you have discharge from your nose?" If so ask, "What color?"     Unsure 7. FEVER: "Do you have a fever?" If so, ask: "What is it, how was it measured, and when did it start?"      No 8. OTHER SYMPTOMS: "Do you have any other symptoms?" (e.g., sore throat, cough, earache, difficulty breathing)     No 9. PREGNANCY: "Is there any chance you are pregnant?" "When was your last menstrual period?"     No  Protocols used: SINUS PAIN OR CONGESTION-A-AH

## 2017-05-15 NOTE — Progress Notes (Signed)
Danielle Cox is a 57 y.o. female is here for an acute visit.   History of Present Illness:   Britt Bottom CMA acting as scribe for Dr. Earlene Plater.  HPI: Patient comes in today for an acute issue. Patient is still having cough, congestion and sinus pain. She is also having nausea with it. She has finished the antibiotic that was prescribed.   Health Maintenance Due  Topic Date Due  . Hepatitis C Screening  1960-03-13  . HIV Screening  06/09/1977  . TETANUS/TDAP  06/09/1981   No flowsheet data found. PMHx, SurgHx, SocialHx, FamHx, Medications, and Allergies were reviewed in the Visit Navigator and updated as appropriate.   Patient Active Problem List   Diagnosis Date Noted  . Affective psychosis, bipolar (HCC)   . Major depressive disorder, recurrent severe without psychotic features (HCC) 12/13/2016  . Bipolar 1 disorder, depressed, severe (HCC) 11/20/2016    Class: Chronic  . Overweight (BMI 25.0-29.9) 03/02/2016  . Personality disorder (HCC) 04/23/2013  . Current every day smoker 02/17/2009  . Essential hypertension 02/17/2009   Social History   Tobacco Use  . Smoking status: Current Every Day Smoker    Packs/day: 1.00    Years: 40.00    Pack years: 40.00    Types: Cigarettes  . Smokeless tobacco: Never Used  Substance Use Topics  . Alcohol use: No  . Drug use: No   Current Medications and Allergies:   .  budesonide-formoterol (SYMBICORT) 160-4.5 MCG/ACT inhaler, Inhale 2 puffs into the lungs 2 (two) times daily., Disp: 1 Inhaler, Rfl: 3 .  carbamazepine (TEGRETOL XR) 200 MG 12 hr tablet, Take 3 tablets (600 mg total) by mouth 2 (two) times daily. For mood stability, Disp: 180 tablet, Rfl: 0 .  clonazePAM (KLONOPIN) 1 MG tablet, Take 1 tablet (1 mg total) by mouth 2 (two) times daily. For anxiety, Disp: 60 tablet, Rfl: 0 .  Eszopiclone 3 MG TABS, Take 3 mg by mouth at bedtime. Take immediately before bedtime, Disp: , Rfl:  .  gabapentin (NEURONTIN) 100 MG capsule,  Take 2 capsules (200 mg total) by mouth 3 (three) times daily., Disp: 180 capsule, Rfl: 0 .  hydrOXYzine (ATARAX/VISTARIL) 50 MG tablet, Take 1 tablet (50 mg total) by mouth every 6 (six) hours as needed for anxiety., Disp: 90 tablet, Rfl: 1 .  PARoxetine (PAXIL) 20 MG tablet, , Disp: , Rfl:    Allergies  Allergen Reactions  . Moxifloxacin Hcl Palpitations    REACTION: tackycardia  . Penicillins Rash    Has patient had a PCN reaction causing immediate rash, facial/tongue/throat swelling, SOB or lightheadedness with hypotension: Yes Has patient had a PCN reaction causing severe rash involving mucus membranes or skin necrosis: No Has patient had a PCN reaction that required hospitalization: No Has patient had a PCN reaction occurring within the last 10 years: No If all of the above answers are "NO", then may proceed with Cephalosporin use.    Review of Systems   Pertinent items are noted in the HPI. Otherwise, ROS is negative.  Vitals:   Vitals:   05/15/17 1256  BP: 114/76  Pulse: 82  Temp: 98.4 F (36.9 C)  TempSrc: Oral  SpO2: 97%  Weight: 161 lb 6.4 oz (73.2 kg)  Height:  (1.651 m)     Body mass index is 26.86 kg/m.   Physical Exam:   Physical Exam  Constitutional: She is oriented to person, place, and time. She appears well-developed and well-nourished. No  distress.  HENT:  Head: Normocephalic and atraumatic.  Right Ear: External ear normal.  Left Ear: External ear normal.  Mouth/Throat: No oropharyngeal exudate.  Eyes: Pupils are equal, round, and reactive to light. Conjunctivae are normal.  Neck: Normal range of motion. Neck supple. No JVD present. No thyromegaly present.  Cardiovascular: Normal rate, regular rhythm and normal heart sounds. Exam reveals no gallop and no friction rub.  No murmur heard. Pulmonary/Chest: Effort normal. She has decreased breath sounds. She has no wheezes. She exhibits no tenderness.  Abdominal: Soft. Bowel sounds are normal.  She exhibits no mass. There is no tenderness. There is no rebound and no guarding.  Musculoskeletal: Normal range of motion.  Lymphadenopathy:    She has no cervical adenopathy.  Neurological: She is alert and oriented to person, place, and time. She has normal reflexes.  Skin: No rash noted.  Psychiatric: She has a normal mood and affect. Her behavior is normal. Judgment and thought content normal.    Assessment and Plan:   Dion was seen today for nasal congestion.  Diagnoses and all orders for this visit:  Cough Sinus pain -     ondansetron (ZOFRAN ODT) 4 MG disintegrating tablet; Take 1 tablet (4 mg total) by mouth every 8 (eight) hours as needed for nausea or vomiting. -     predniSONE (DELTASONE) 5 MG tablet; Please take 6,5,4,3,2,1 and done. -     ranitidine (ZANTAC) 300 MG tablet; Take 1 tablet (300 mg total) by mouth at bedtime.  . Reviewed expectations re: course of current medical issues. . Discussed self-management of symptoms. . Outlined signs and symptoms indicating need for more acute intervention. . Patient verbalized understanding and all questions were answered. Marland Kitchen Health Maintenance issues including appropriate healthy diet, exercise, and smoking avoidance were discussed with patient. . See orders for this visit as documented in the electronic medical record. . Patient received an After Visit Summary.  [name], DO [place], [facility] 05/15/2017  No future appointments.

## 2017-05-15 NOTE — Telephone Encounter (Signed)
See note

## 2017-05-25 ENCOUNTER — Other Ambulatory Visit: Payer: Self-pay | Admitting: Family Medicine

## 2017-05-27 NOTE — Telephone Encounter (Signed)
Do you want patient to keep taking looks like original script was only for 14 days?

## 2017-08-07 ENCOUNTER — Ambulatory Visit: Payer: Medicare Other | Admitting: Family Medicine

## 2017-08-07 ENCOUNTER — Encounter: Payer: Self-pay | Admitting: Family Medicine

## 2017-08-07 ENCOUNTER — Ambulatory Visit (INDEPENDENT_AMBULATORY_CARE_PROVIDER_SITE_OTHER): Payer: Medicare Other

## 2017-08-07 VITALS — BP 122/76 | HR 85 | Temp 98.1°F | Ht 65.0 in | Wt 172.4 lb

## 2017-08-07 DIAGNOSIS — R600 Localized edema: Secondary | ICD-10-CM | POA: Diagnosis not present

## 2017-08-07 DIAGNOSIS — M79672 Pain in left foot: Secondary | ICD-10-CM | POA: Diagnosis not present

## 2017-08-07 DIAGNOSIS — M25562 Pain in left knee: Secondary | ICD-10-CM

## 2017-08-07 DIAGNOSIS — R5383 Other fatigue: Secondary | ICD-10-CM

## 2017-08-07 DIAGNOSIS — R5381 Other malaise: Secondary | ICD-10-CM | POA: Diagnosis not present

## 2017-08-07 LAB — CBC WITH DIFFERENTIAL/PLATELET
Basophils Absolute: 0.1 10*3/uL (ref 0.0–0.1)
Basophils Relative: 0.7 % (ref 0.0–3.0)
Eosinophils Absolute: 0.3 10*3/uL (ref 0.0–0.7)
Eosinophils Relative: 3.6 % (ref 0.0–5.0)
HCT: 42.9 % (ref 36.0–46.0)
Hemoglobin: 14.6 g/dL (ref 12.0–15.0)
Lymphocytes Relative: 19.3 % (ref 12.0–46.0)
Lymphs Abs: 1.7 10*3/uL (ref 0.7–4.0)
MCHC: 34 g/dL (ref 30.0–36.0)
MCV: 95 fl (ref 78.0–100.0)
Monocytes Absolute: 0.6 10*3/uL (ref 0.1–1.0)
Monocytes Relative: 6.8 % (ref 3.0–12.0)
Neutro Abs: 6.2 10*3/uL (ref 1.4–7.7)
Neutrophils Relative %: 69.6 % (ref 43.0–77.0)
Platelets: 270 10*3/uL (ref 150.0–400.0)
RBC: 4.52 Mil/uL (ref 3.87–5.11)
RDW: 14.8 % (ref 11.5–15.5)
WBC: 8.8 10*3/uL (ref 4.0–10.5)

## 2017-08-07 LAB — FERRITIN: Ferritin: 14.6 ng/mL (ref 10.0–291.0)

## 2017-08-07 MED ORDER — TRIAMTERENE-HCTZ 37.5-25 MG PO TABS: 1.0000 | ORAL_TABLET | Freq: Every day | ORAL | 3 refills | Status: DC

## 2017-08-07 NOTE — Progress Notes (Signed)
Danielle Cox is a 57 y.o. female here for an acute visit.  History of Present Illness:   HPI: Mechanical fall. Trauma to medal left foot, left knee. Happened > 1 week ago but still painful. Bruises improving. No previous issues.   PMHx, SurgHx, SocialHx, Medications, and Allergies were reviewed in the Visit Navigator and updated as appropriate.  Current Medications:   .  budesonide-formoterol (SYMBICORT) 160-4.5 MCG/ACT inhaler, Inhale 2 puffs into the lungs 2 (two) times daily., Disp: 1 Inhaler, Rfl: 3 .  carbamazepine (TEGRETOL XR) 200 MG 12 hr tablet, Take 3 tablets (600 mg total) by mouth 2 (two) times daily. For mood stability, Disp: 180 tablet, Rfl: 0 .  clonazePAM (KLONOPIN) 1 MG tablet, Take 1 tablet (1 mg total) by mouth 2 (two) times daily. For anxiety, Disp: 60 tablet, Rfl: 0 .  Eszopiclone 3 MG TABS, Take 3 mg by mouth at bedtime. Take immediately before bedtime, Disp: , Rfl:  .  gabapentin (NEURONTIN) 100 MG capsule, Take 2 capsules (200 mg total) by mouth 3 (three) times daily., Disp: 180 capsule, Rfl: 0 .  methylphenidate (RITALIN) 10 MG tablet, , Disp: , Rfl:  .  PARoxetine (PAXIL) 20 MG tablet, , Disp: , Rfl:  .  traZODone (DESYREL) 150 MG tablet, , Disp: , Rfl:    Allergies  Allergen Reactions  . Moxifloxacin Hcl Palpitations    REACTION: tackycardia  . Penicillins Rash    Has patient had a PCN reaction causing immediate rash, facial/tongue/throat swelling, SOB or lightheadedness with hypotension: Yes Has patient had a PCN reaction causing severe rash involving mucus membranes or skin necrosis: No Has patient had a PCN reaction that required hospitalization: No Has patient had a PCN reaction occurring within the last 10 years: No If all of the above answers are "NO", then may proceed with Cephalosporin use.    Review of Systems:   Pertinent items are noted in the HPI. Otherwise, ROS is negative.  Vitals:   Vitals:   08/07/17 1111  BP: 122/76  Pulse: 85   Temp: 98.1 F (36.7 C)  SpO2: 96%  Weight: 172 lb 6.4 oz (78.2 kg)  Height: 5\' 5"  (1.651 m)     Body mass index is 28.69 kg/m.  Physical Exam:   Physical Exam  Constitutional: She appears well-nourished.  HENT:  Head: Normocephalic and atraumatic.  Eyes: Pupils are equal, round, and reactive to light. EOM are normal.  Neck: Normal range of motion. Neck supple.  Cardiovascular: Normal rate, regular rhythm, normal heart sounds and intact distal pulses.  Pulmonary/Chest: Effort normal.  Abdominal: Soft.  Musculoskeletal: She exhibits edema.       Left knee: She exhibits no effusion and normal patellar mobility. Tenderness found. Medial joint line and lateral joint line tenderness noted.       Feet:  Skin: Skin is warm.  Psychiatric: She has a normal mood and affect. Her behavior is normal.  Nursing note and vitals reviewed.    Results for orders placed or performed in visit on 08/07/17  CBC with Differential/Platelet  Result Value Ref Range   WBC 8.8 4.0 - 10.5 K/uL   RBC 4.52 3.87 - 5.11 Mil/uL   Hemoglobin 14.6 12.0 - 15.0 g/dL   HCT 96.0 45.4 - 09.8 %   MCV 95.0 78.0 - 100.0 fl   MCHC 34.0 30.0 - 36.0 g/dL   RDW 11.9 14.7 - 82.9 %   Platelets 270.0 150.0 - 400.0 K/uL   Neutrophils Relative %  69.6 43.0 - 77.0 %   Lymphocytes Relative 19.3 12.0 - 46.0 %   Monocytes Relative 6.8 3.0 - 12.0 %   Eosinophils Relative 3.6 0.0 - 5.0 %   Basophils Relative 0.7 0.0 - 3.0 %   Neutro Abs 6.2 1.4 - 7.7 K/uL   Lymphs Abs 1.7 0.7 - 4.0 K/uL   Monocytes Absolute 0.6 0.1 - 1.0 K/uL   Eosinophils Absolute 0.3 0.0 - 0.7 K/uL   Basophils Absolute 0.1 0.0 - 0.1 K/uL  Ferritin  Result Value Ref Range   Ferritin 14.6 10.0 - 291.0 ng/mL    Assessment and Plan:   Danielle Cox was seen today for fatigue.  Diagnoses and all orders for this visit:  Left foot pain -     DG Foot Complete Left  Acute pain of left knee -     DG Knee AP/LAT W/Sun[place] Left  Malaise and fatigue -      CBC with Differential/Platelet -     Ferritin  Bilateral lower extremity edema -     triamterene-hydrochlorothiazide (MAXZIDE-25) 37.5-25 MG tablet; Take 1 tablet by mouth daily.    . Reviewed expectations re: course of current medical issues. . Discussed self-management of symptoms. . Outlined signs and symptoms indicating need for more acute intervention. . Patient verbalized understanding and all questions were answered. Marland Kitchen. Health Maintenance issues including appropriate healthy diet, exercise, and smoking avoidance were discussed with patient. . See orders for this visit as documented in the electronic medical record. . Patient received an After Visit Summary.  Helane RimaErica Tashya Alberty, DO [place] 08/10/2017

## 2017-08-15 ENCOUNTER — Telehealth: Payer: Self-pay | Admitting: Family Medicine

## 2017-08-15 NOTE — Telephone Encounter (Signed)
Called patient and let her know message received and will get response with Dr. Earlene PlaterWallace is back in office tomorrow.

## 2017-08-15 NOTE — Telephone Encounter (Signed)
Labs done were normal. Please advise.

## 2017-08-15 NOTE — Telephone Encounter (Signed)
Copied from CRM [phone]. Topic: General - Other >> Aug 15, 2017 11:30 AM Leafy Roobinson, Norma J wrote: Reason for CRM:pt was seen on 7/24 for fatigue. Pt is still experiencing fatigue and would like to know the next step

## 2017-09-08 ENCOUNTER — Other Ambulatory Visit: Payer: Self-pay | Admitting: Family Medicine

## 2017-09-18 ENCOUNTER — Encounter: Payer: Self-pay | Admitting: Family Medicine

## 2017-09-18 ENCOUNTER — Ambulatory Visit: Payer: Medicare Other | Admitting: Family Medicine

## 2017-09-18 VITALS — BP 138/88 | HR 84 | Temp 98.4°F | Ht 65.0 in | Wt 174.6 lb

## 2017-09-18 DIAGNOSIS — R5382 Chronic fatigue, unspecified: Secondary | ICD-10-CM

## 2017-09-18 DIAGNOSIS — M2550 Pain in unspecified joint: Secondary | ICD-10-CM | POA: Diagnosis not present

## 2017-09-18 DIAGNOSIS — M797 Fibromyalgia: Secondary | ICD-10-CM | POA: Diagnosis not present

## 2017-09-18 LAB — SEDIMENTATION RATE: SED RATE: 19 mm/h (ref 0–30)

## 2017-09-18 LAB — C-REACTIVE PROTEIN: CRP: 0.5 mg/dL (ref 0.5–20.0)

## 2017-09-18 MED ORDER — CYCLOBENZAPRINE HCL 5 MG PO TABS: 10.0000 mg | ORAL_TABLET | Freq: Three times a day (TID) | ORAL | 0 refills | Status: DC | PRN

## 2017-09-18 MED ORDER — GABAPENTIN 300 MG PO CAPS: 300.0000 mg | ORAL_CAPSULE | Freq: Three times a day (TID) | ORAL | 0 refills | Status: DC

## 2017-09-18 NOTE — Progress Notes (Signed)
Patient: Danielle Cox MRN: [id number] DOB: 03-30-1960 PCP: [name], DO     Subjective:  Chief Complaint  Patient presents with  . Fatigue  . Joint Pain  . back and neck pain    HPI: The patient is a 57 y.o. female who presents today for fatigue that she has had for months. She apparently had a work up done with her PCP. All labs have been normal that were done in April with normal repeat ferritin in July. She has a hx of bipolar, depression and personality disorder. She states her sleep pattern is from 9:30-3:30am then usually sleeps from 3:30-6:00am. Her psychiatrist just put her on trazodone which helps her sleep. She denies any blood in her stool, no easy bruising. She denies any night sweats or fevers. She denies any rapid weight loss.  She does have strong family history of cancer. She does not exercise. She does not work. +smoker.   Joint pain: her main complaint is the middle of her spine and is so severe that she can not sit still for very long. No trauma to that area. She does not work and doesn't really lift anything heavy. She has taken Excedrin and aleve and has had no relief. Pain is rated as an 8/10 and described as sharp and stabbing. Pain is constant and does not radiate. Sitting makes it worse. Nothing makes it better. She had this before a few years ago and went to a chiropractor and was given "steroids." She thinks this helped her. She also has been sleeping on a very soft bed. She also has pain in her shoulders, hips, knees and neck. Denies any radicular symptoms, weakness.   Would also like a full body scan for cancer.   Review of Systems  Constitutional: Positive for appetite change and fatigue.  Respiratory: Negative for shortness of breath.   Cardiovascular: Negative for chest pain.  Gastrointestinal: Positive for abdominal pain and nausea.  Genitourinary: Negative for dysuria.  Musculoskeletal: Positive for back pain and neck pain.  Neurological:  Positive for headaches. Negative for dizziness.  Psychiatric/Behavioral: The patient is nervous/anxious.     Allergies Patient is allergic to moxifloxacin hcl and penicillins.  Past Medical History Patient  has a past medical history of Anxiety, Bipolar disorder (HCC), Bronchitis, chronic (HCC), COPD (chronic obstructive pulmonary disease) (HCC), Depression, and Personality disorder (HCC).  Surgical History Patient  has a past surgical history that includes Cesarean section (1983, 1988, 1992); Tonsillectomy (age 34); and Carpal tunnel release (Right).  Family History Pateint's family history includes Alcohol abuse in her brother, father, mother, and sister; Depression in her brother, father, mother, and sister.  Social History Patient  reports that she has been smoking cigarettes. She has a 40.00 pack-year smoking history. She has never used smokeless tobacco. She reports that she does not drink alcohol or use drugs.    Objective: Vitals:   09/18/17 0849  BP: 138/88  Pulse: 84  Temp: 98.4 F (36.9 C)  TempSrc: Oral  SpO2: 96%  Weight: 174 lb 9.6 oz (79.2 kg)  Height: 5\' 5"  (1.651 m)    Body mass index is 29.05 kg/m.  Physical Exam  Constitutional: She appears well-developed and well-nourished.  Tobacco odor.   Eyes: Pupils are equal, round, and reactive to light. EOM are normal.  Neck: Normal range of motion. Neck supple. No thyromegaly present.  Cardiovascular: Normal rate, regular rhythm and normal heart sounds.  Pulmonary/Chest: Effort normal and breath sounds normal.  Abdominal: Soft. Bowel  sounds are normal.  Musculoskeletal: She exhibits tenderness (TTP over all 11 fibro trigger points. no abnormal joints. no erythema/edema. ROM intact. she is tender over the thoracic spine around T4-T7 as well as paraspinal muscles. ). She exhibits no edema or deformity.  Lymphadenopathy:    She has no cervical adenopathy.  Neurological: No cranial nerve deficit.  Skin: No rash  noted.  Vitals reviewed.      Assessment/plan: 1. Arthralgia, unspecified joint History and exam consistent with fibromyagia. Discussed this in detail with her. Going to check labs for other rheum issue, but discussed exam findings were normal. She is on paxil,so can not do cymbalta. I did increase her gabapentin to 300mg  TID to maybe help her fibro some. Also discussed number one treatment for fibro is exercise. Unsure why her mid back is sore. No trauma or precipitating factors. No indication to image at this point. If not better with stretching, heat, muscle relaxer f/u with pcp in 1-2 weeks for imaging and further work up. Drowsy precautions given for muscle relaxer. She is asking for a steroid and I discussed no indication for this at this time.  - ANA - Rheumatoid factor - C-reactive protein - Sedimentation rate  2. Chronic fatigue Has had full lab work up with pcp and all normal. I think a lot of this is due to depression and fibro and discussed this with her. If we can treat the fibro, get her exercising hopefully this will get better.   3. Fibromyalgia See above. Handout given   We do not do full cancer screens. F/u with pcp for routine/preventative health. Can pay for full body scan out of pocket if she would like.   F/u with pcp for fibro in 3 months. Otherwise 1-2 weeks if back not better.     Return if symptoms worsen or fail to improve.    Orland Mustard, MD [place] [facility]   09/18/2017

## 2017-09-18 NOTE — Patient Instructions (Signed)
Increasing your gabapentin to 300mg  three times a day and adding a low dose muscle relaxer to your back. Continue heating pad, stretching and exercise. Fibromyaglia is actually improved with exercise! If not better in 1-2 weeks, follow up with PCP for imaging.     Myofascial Pain Syndrome and Fibromyalgia Myofascial pain syndrome and fibromyalgia are both pain disorders. This pain may be felt mainly in your muscles.  Myofascial pain syndrome: ? Always has trigger points or tender points in the muscle that will cause pain when pressed. The pain may come and go. ? Usually affects your neck, upper back, and shoulder areas. The pain often radiates into your arms and hands.  Fibromyalgia: ? Has muscle pains and tenderness that come and go. ? Is often associated with fatigue and sleep disturbances. ? Has trigger points. ? Tends to be long-lasting (chronic), but is not life-threatening.  Fibromyalgia and myofascial pain are not the same. However, they often occur together. If you have both conditions, each can make the other worse. Both are common and can cause enough pain and fatigue to make day-to-day activities difficult. What are the causes? The exact causes of fibromyalgia and myofascial pain are not known. People with certain gene types may be more likely to develop fibromyalgia. Some factors can be triggers for both conditions, such as:  Spine disorders.  Arthritis.  Severe injury (trauma) and other physical stressors.  Being under a lot of stress.  A medical illness.  What are the signs or symptoms? Fibromyalgia The main symptom of fibromyalgia is widespread pain and tenderness in your muscles. This can vary over time. Pain is sometimes described as stabbing, shooting, or burning. You may have tingling or numbness, too. You may also have sleep problems and fatigue. You may wake up feeling tired and groggy (fibro fog). Other symptoms may include:  Bowel and bladder  problems.  Headaches.  Visual problems.  Problems with odors and noises.  Depression or mood changes.  Painful menstrual periods (dysmenorrhea).  Dry skin or eyes.  Myofascial pain syndrome Symptoms of myofascial pain syndrome include:  Tight, ropy bands of muscle.  Uncomfortable sensations in muscular areas, such as: ? Aching. ? Cramping. ? Burning. ? Numbness. ? Tingling. ? Muscle weakness.  Trouble moving certain muscles freely (range of motion).  How is this diagnosed? There are no specific tests to diagnose fibromyalgia or myofascial pain syndrome. Both can be hard to diagnose because their symptoms are common in many other conditions. Your health care provider may suspect one or both of these conditions based on your symptoms and medical history. Your health care provider will also do a physical exam. The key to diagnosing fibromyalgia is having pain, fatigue, and other symptoms for more than three months that cannot be explained by another condition. The key to diagnosing myofascial pain syndrome is finding trigger points in muscles that are tender and cause pain elsewhere in your body (referred pain). How is this treated? Treating fibromyalgia and myofascial pain often requires a team of health care providers. This usually starts with your primary provider and a physical therapist. You may also find it helpful to work with alternative health care providers, such as massage therapists or acupuncturists. Treatment for fibromyalgia may include medicines. This may include nonsteroidal anti-inflammatory drugs (NSAIDs), along with other medicines. Treatment for myofascial pain may also include:  NSAIDs.  Cooling and stretching of muscles.  Trigger point injections.  Sound wave (ultrasound) treatments to stimulate muscles.  Follow these instructions at  home:  Take medicines only as directed by your health care provider.  Exercise as directed by your health care  provider or physical therapist.  Try to avoid stressful situations.  Practice relaxation techniques to control your stress. You may want to try: ? Biofeedback. ? Visual imagery. ? Hypnosis. ? Muscle relaxation. ? Yoga. ? Meditation.  Talk to your health care provider about alternative treatments, such as acupuncture or massage treatment.  Maintain a healthy lifestyle. This includes eating a healthy diet and getting enough sleep.  Consider joining a support group.  Do not do activities that stress or strain your muscles. That includes repetitive motions and heavy lifting. Where to find more information:  National Fibromyalgia Association: www.fmaware.org  Arthritis Foundation: www.arthritis.org  American Chronic Pain Association: GumSearch.nl Contact a health care provider if:  You have new symptoms.  Your symptoms get worse.  You have side effects from your medicines.  You have trouble sleeping.  Your condition is causing depression or anxiety. This information is not intended to replace advice given to you by your health care provider. Make sure you discuss any questions you have with your health care provider. Document Released: 01/01/2005 Document Revised: 06/09/2015 Document Reviewed: 10/07/2013 Elsevier Interactive Patient Education  [employer].

## 2017-09-22 LAB — ANA: Anti Nuclear Antibody(ANA): NEGATIVE

## 2017-09-22 LAB — RHEUMATOID FACTOR

## 2017-09-25 ENCOUNTER — Telehealth: Payer: Self-pay | Admitting: Family Medicine

## 2017-09-25 NOTE — Telephone Encounter (Signed)
See note

## 2017-09-25 NOTE — Telephone Encounter (Addendum)
An appointment was made for the patient for 10/07/17 at 2:20pm., but the patient would like to know if there is something she can be given for the pain she is experiencing until her appt with Dr. Berline Chough.  She was crying the entire time she was talking to me.

## 2017-09-25 NOTE — Telephone Encounter (Signed)
Copied from CRM [id number]. Topic: Quick Communication - See Telephone Encounter >> Sep 25, 2017 11:49 AM Lorrine Kin, NT wrote: CRM for notification. See Telephone encounter for: 09/25/17. Patient calling and states that she had seen Dr Artis Flock and was diagnosed with fibromyalgia. States that she is still having pain. States that the muscle relaxer's that were prescribed, did not work. States that she is still having pain in legs, back, and neck. Would like to know what else could be done. Please advise. [facility] - [place], [place] - [address]

## 2017-09-25 NOTE — Telephone Encounter (Signed)
Let's get her in with Dr. Berline Chough.

## 2017-09-25 NOTE — Telephone Encounter (Signed)
Please advise 

## 2017-09-26 NOTE — Telephone Encounter (Signed)
Called patient let her know you are out of office today but we will send you a message.

## 2017-09-27 MED ORDER — KETOROLAC TROMETHAMINE 10 MG PO TABS: 10.0000 mg | ORAL_TABLET | Freq: Four times a day (QID) | ORAL | 0 refills | Status: DC | PRN

## 2017-09-27 NOTE — Telephone Encounter (Signed)
I called in Toradol oral. Make sure she understands that it is very strong. May need Zantac to protect stomach during this time.

## 2017-09-27 NOTE — Telephone Encounter (Signed)
Gave patient information had repeat back to me no questions at this time.

## 2017-10-07 ENCOUNTER — Ambulatory Visit (INDEPENDENT_AMBULATORY_CARE_PROVIDER_SITE_OTHER): Payer: Medicare Other

## 2017-10-07 ENCOUNTER — Encounter: Payer: Self-pay | Admitting: Sports Medicine

## 2017-10-07 ENCOUNTER — Ambulatory Visit: Payer: Medicare Other | Admitting: Sports Medicine

## 2017-10-07 VITALS — BP 120/76 | HR 85 | Ht 65.0 in | Wt 177.6 lb

## 2017-10-07 DIAGNOSIS — G8929 Other chronic pain: Secondary | ICD-10-CM

## 2017-10-07 DIAGNOSIS — M542 Cervicalgia: Secondary | ICD-10-CM

## 2017-10-07 DIAGNOSIS — M5412 Radiculopathy, cervical region: Secondary | ICD-10-CM

## 2017-10-07 DIAGNOSIS — R5382 Chronic fatigue, unspecified: Secondary | ICD-10-CM | POA: Diagnosis not present

## 2017-10-07 DIAGNOSIS — M545 Low back pain: Secondary | ICD-10-CM | POA: Diagnosis not present

## 2017-10-07 MED ORDER — METHYLPREDNISOLONE 4 MG PO TBPK: ORAL_TABLET | ORAL | 0 refills | Status: DC

## 2017-10-07 NOTE — Progress Notes (Signed)
Veverly FellsMichael D. Delorise Shinerigby, Cox  [facility] [phone]  Danielle MartinezSheila K Cox - 57 y.o. female MRN [id number]  Date of birth: 06-Jun-1960  Visit Date: 10/07/2017  PCP: Danielle Cox, Danielle Cox   Referred by: Danielle Cox, Danielle Cox   Scribe(s) for today's visit: Danielle FabianMolly Cox, LAT, ATC  SUBJECTIVE:  Danielle MartinezSheila K Cox is here for New Patient (Initial Visit) (Neck, back and leg pain)  HPI: Her neck and back pain symptoms INITIALLY: Began about 1.5-2 months ago after falling down 3 steps and landed on her stomach and her knees.  She states that she's had XR done of her L knee and L foot about 2 weeks after her accident when she saw Dr. Earlene PlaterWallace.  She states that she is also having pain in her neck that travels down her whole spine w/ radiating pain into B UEs.  Notes numbness in her R hand.  She also saw Dr. Artis Cox on 09/18/17 who diagnosed her w/ fibromyalgia. Described as 8/10 constant, sharp, stabbing pain in the middle of her spine.  She notes burning pain into her B LEs (L>R) and soreness in her B UEs, radiating to B LEs and B UEs Worsened with sitting, transitioning from sit to stand, prolonged standing Improved with muscle relaxer, heating pad Additional associated symptoms include: N/T noted in R hand    At this time symptoms show no change compared to onset. She has been taking Gabapentin 300mg  tid and a muscle relaxer.  REVIEW OF SYSTEMS: Reports night time disturbances. Denies fevers, chills, or night sweats. Denies unexplained weight loss. Denies personal history of cancer. Denies changes in bowel or bladder habits. Reports recent unreported falls. Reports new or worsening dyspnea or wheezing. Reports headaches or dizziness. Yes to both Reports numbness, tingling or weakness  In the extremities - in R hand Reports dizziness or presyncopal episodes Reports lower extremity edema     HISTORY:  Prior history reviewed and updated per electronic medical  record.  Social History   Occupational History  . Not on file  Tobacco Use  . Smoking status: Current Every Day Smoker    Packs/day: 1.00    Years: 40.00    Pack years: 40.00    Types: Cigarettes  . Smokeless tobacco: Never Used  Substance and Sexual Activity  . Alcohol use: No  . Drug use: No  . Sexual activity: Not Currently    Partners: Male    Birth control/protection: None   Social History   Social History Narrative   10/29/2012 [name] [name] was born and grew up in [place], [place]. She has 2 brothers and one sister. She reports that her childhood was "poor," and explains that she needs both financially and emotionally. She completed the 10th grade, then achieved her GED. She has been married twice. The first time at age 57, and that ended after 1 year. She is currently married to her second husband of 27 years. She has 2 sons and one daughter. She is currently unemployed and on disability for 2 years. She lives with her husband, her daughter, and one of her daughter's friends. She denies any legal difficulties. She reports that she is spiritual but not religious. Her hobbies include reading and cross stitch. She reports that she has no social support network. 10/29/2012 [name]    Past Medical History:  Diagnosis Date  . Anxiety   . Bipolar disorder (HCC)   . Bronchitis, chronic (HCC)   . COPD (chronic obstructive pulmonary  disease) (HCC)   . Depression   . Personality disorder [facility])    Past Surgical History:  Procedure Laterality Date  . CARPAL TUNNEL RELEASE Right   . CESAREAN SECTION  1983, 1988, 1992  . TONSILLECTOMY  age 13   family history includes Alcohol abuse in her brother, father, mother, and sister; Depression in her brother, father, mother, and sister.  DATA OBTAINED & REVIEWED:   Recent Labs    12/15/16 0741  HGBA1C 5.6   . 10/07/2017: X-rays cervical and lumbar spine: o Lumbar spine withSignificant degenerative disc disease L5-S1.  Normal alignment no acute  abnormality. o Cervical spine Significant progression of degenerative disc disease from C3-4 to C6-7 with loss of cervical lordosis. .   OBJECTIVE:  VS:  HT:5\' 5"  (165.1 cm)   WT:177 lb 9.6 oz (80.6 kg)  BMI:29.55    BP:120/76  HR:85bpm  TEMP: ( )  RESP:95 %   PHYSICAL EXAM: CONSTITUTIONAL: Well-developed, Well-nourished and In no acute distress PSYCHIATRIC: Alert & appropriately interactive. and Not depressed or anxious appearing. RESPIRATORY: No increased work of breathing and Trachea Midline EYES: Pupils are equal., EOM intact without nystagmus. and No scleral icterus.  VASCULAR EXAM: Warm and well perfused NEURO: unremarkable Abnormal DTR in the Right biceps is trace it actually is hyperexcitable with with repetitive.  To the point that she does have several beats of clonus  MSK Exam: Neck and back:   Well aligned, no significant deformity. No overlying skin changes. No focal bony tenderness TTP over Bilateral cervical and lumbar paraspinal musculature.   RANGE OF MOTION & STRENGTH  Normal upper and lower extremity strength with good overhead range of motion of the shoulders and normal internal and external rotation of bilateral hips.   SPECIALITY TESTING:  Neural tension testing is positive straight leg raise and Spurling's compression test, Lhermitte's compression test, brachial plexus squeeze and arm squeeze test.     ASSESSMENT   1. Cervical radiculopathy   2. Neck pain   3. Chronic bilateral low back pain, with sciatica presence unspecified   4. Chronic fatigue     PLAN:  Pertinent additional documentation may be included in corresponding procedure notes, imaging studies, problem based documentation and patient instructions.  Procedures:  . None  Medications:  Meds ordered this encounter  Medications  . DISCONTD: methylPREDNISolone (MEDROL DOSEPAK) 4 MG TBPK tablet    Sig: Take by mouth as directed. Take 6 tablets on the first day prescribed then as  directed.    Dispense:  21 tablet    Refill:  0   Discussion/Instructions: No problem-specific Assessment & Plan notes found for this encounter.  . Patient has multifactorial pain with concern for potential underlying cervical myelopathy given the abnormal upper extremity reflexes.  We will go ahead and set her up for MRI of her cervical spine given this finding to rule out myelopathy.  If any lack of improvement can consider further evaluation of her lumbar spine and/or referral to formal physical therapy. .   . Discussed red flag symptoms that warrant earlier emergent evaluation and patient voices understanding. . Activity modifications and the importance of avoiding exacerbating activities (limiting pain to no more than a 4 / 10 during or following activity) recommended and discussed.  Follow-up:  . Return for MRI results review.   . If any lack of improvement consider: further diagnostic evaluation with MRI LUMBAR spine if unrevealing CERVICAL      CMA/ATC served as scribe during this visit. History, Physical,  and Plan performed by medical provider. Documentation and orders reviewed and attested to.      Andrena Mews, Cox    [facility] Physician

## 2017-10-07 NOTE — Patient Instructions (Addendum)
We are ordering an MRI for you today.  The imaging office will be calling you to schedule your appointment after we obtain authorization from your insurance company.   Please be sure you have signed up for MyChart so that we can get your results to you.  We will be in touch with you as soon as we can.  Please know, it can take up to 3-4 business days for the radiologist and Dr. Berline Choughigby to have time to review the results and determine the best appropriate action.  If there is something that appears to be surgical or needs a referral to other specialists we will let you know through MyChart or telephone.  Otherwise we will plan to schedule a follow up appointment with Dr. Berline Choughigby once we have the results.   If you [facility]'t heard [facility] by 10/14/17, please give them a call to schedule at [phone].

## 2017-10-14 ENCOUNTER — Other Ambulatory Visit: Payer: Self-pay | Admitting: Physical Therapy

## 2017-10-14 ENCOUNTER — Telehealth: Payer: Self-pay | Admitting: *Deleted

## 2017-10-14 ENCOUNTER — Other Ambulatory Visit: Payer: Self-pay | Admitting: Family Medicine

## 2017-10-14 MED ORDER — KETOROLAC TROMETHAMINE 10 MG PO TABS: 10.0000 mg | ORAL_TABLET | Freq: Four times a day (QID) | ORAL | 0 refills | Status: DC | PRN

## 2017-10-14 NOTE — Telephone Encounter (Signed)
Copied from CRM [id number]. Topic: Quick Communication - See Telephone Encounter >> Oct 14, 2017 11:21 AM Waymon Amato wrote: Pt is calling with pain in her neck and shoulders all the way down her spine and is stating that the prednisone is not helping and she has questions regarding the mri with Dr. Chriss Czar number [phone]

## 2017-10-14 NOTE — Telephone Encounter (Signed)
Called pt again and informed her of the reasoning behind the c-spine MRI.  Pt states that she will need to talk to her husband to decide if she wants to get the MRI.  I advise her to call [facility] directly to schedule the MRI.  Also discuss her medications and she states that the Toradol 10mg  that Dr. Earlene Plater prescribed helped her the most so an rx was placed for Toradol 10mg .

## 2017-10-14 NOTE — Telephone Encounter (Signed)
Returned pt's call and LM to call [facility] line directly [phone]) so we can attempt to answer her questions

## 2017-10-20 ENCOUNTER — Encounter: Payer: Self-pay | Admitting: Sports Medicine

## 2017-10-20 ENCOUNTER — Ambulatory Visit
Admission: RE | Admit: 2017-10-20 | Discharge: 2017-10-20 | Disposition: A | Discharge status: Home / Self Care | Payer: Medicare Other | Source: Ambulatory Visit | Attending: Sports Medicine | Admitting: Sports Medicine

## 2017-10-20 DIAGNOSIS — M542 Cervicalgia: Secondary | ICD-10-CM

## 2017-10-20 DIAGNOSIS — M5412 Radiculopathy, cervical region: Secondary | ICD-10-CM

## 2017-10-21 NOTE — Progress Notes (Signed)
No evidence of myelopathy.  She does have significant changes however and slight flattening of the cord worse at C5-6. We need to have her come in for a follow-up appointment to review these results and discuss the next step

## 2017-10-22 ENCOUNTER — Encounter: Payer: Self-pay | Admitting: Sports Medicine

## 2017-10-22 ENCOUNTER — Ambulatory Visit: Payer: Medicare Other | Admitting: Sports Medicine

## 2017-10-22 VITALS — BP 140/86 | HR 92 | Ht 65.0 in | Wt 179.0 lb

## 2017-10-22 DIAGNOSIS — M545 Low back pain, unspecified: Secondary | ICD-10-CM

## 2017-10-22 DIAGNOSIS — M2550 Pain in unspecified joint: Secondary | ICD-10-CM | POA: Diagnosis not present

## 2017-10-22 DIAGNOSIS — M5412 Radiculopathy, cervical region: Secondary | ICD-10-CM

## 2017-10-22 DIAGNOSIS — M25562 Pain in left knee: Secondary | ICD-10-CM

## 2017-10-22 DIAGNOSIS — M542 Cervicalgia: Secondary | ICD-10-CM

## 2017-10-22 MED ORDER — GABAPENTIN 300 MG PO CAPS
300.0000 mg | ORAL_CAPSULE | Freq: Three times a day (TID) | ORAL | 1 refills | Status: DC
Start: 2017-10-22 — End: 2018-02-04

## 2017-10-22 NOTE — Patient Instructions (Addendum)
Please perform the exercise program that we have prepared for you and gone over in detail on a daily basis.  In addition to the handout you were provided you can access your program through: [email or web address]   Your unique program code is:  [email or web address]

## 2017-10-22 NOTE — Progress Notes (Signed)
Danielle Cox. Danielle Cox [facility] at [facility] [phone]  Danielle Cox - 57 y.o. female MRN [id number]  Date of birth: 06-21-1960  Visit Date: 10/22/2017  PCP: [name], DO   Referred by: [name], DO   Scribe(s) for today's visit: Christoper Fabian, LAT, ATC  SUBJECTIVE:  [name] is here for Follow-up (c-spine pain / cervical radiculopathy)  HPI: Her neck and back pain symptoms INITIALLY: Began about 1.5-2 months ago after falling down 3 steps and landed on her stomach and her knees.  She states that she's had XR done of her L knee and L foot about 2 weeks after her accident when she saw Dr. Earlene Plater.  She states that she is also having pain in her neck that travels down her whole spine w/ radiating pain into B UEs.  Notes numbness in her R hand.  She also saw Dr. Artis Flock on 09/18/17 who diagnosed her w/ fibromyalgia. Described as 8/10 constant, sharp, stabbing pain in the middle of her spine.  She notes burning pain into her B LEs (L>R) and soreness in her B UEs, radiating to B LEs and B UEs Worsened with sitting, transitioning from sit to stand, prolonged standing Improved with muscle relaxer, heating pad Additional associated symptoms include: N/T noted in R hand    At this time symptoms show no change compared to onset. She has been taking Gabapentin 300mg  tid and a muscle relaxer.  10/22/2017: Compared to the last office visit on 10/07/17, her previously described neck and back pain symptoms show no change. Current symptoms are described as an 8/10 constant,sharp, stabbing pain in the middle of her spine & are radiating to B UEs and LEs. She has been taking Gabapentin 300 mg tid and a muscle relaxer.  She was also prescribed a steroid dose pack which she has finished.  C-spine and L-spine XR - 10/07/17 C-spine MRI - 10/20/17  REVIEW OF SYSTEMS: Reports night time disturbances. Denies fevers, chills, or night sweats. Denies  unexplained weight loss. Denies personal history of cancer. Denies changes in bowel or bladder habits. Reports recent unreported falls. Reports new or worsening dyspnea or wheezing. Reports headaches or dizziness. Yes to both Reports numbness, tingling or weakness  In the extremities - in R hand Reports dizziness or presyncopal episodes Reports lower extremity edema    HISTORY:  Prior history reviewed and updated per electronic medical record.  Social History   Occupational History  . Not on file  Tobacco Use  . Smoking status: Current Every Day Smoker    Packs/day: 1.00    Years: 40.00    Pack years: 40.00    Types: Cigarettes  . Smokeless tobacco: Never Used  Substance and Sexual Activity  . Alcohol use: No  . Drug use: No  . Sexual activity: Not Currently    Partners: Male    Birth control/protection: None   Social History   Social History Narrative   10/29/2012 [name] Mckynlee was born and grew up in [place], [place]. She has 2 brothers and one sister. She reports that her childhood was "poor," and explains that she needs both financially and emotionally. She completed the 10th grade, then achieved her GED. She has been married twice. The first time at age 44, and that ended after 1 year. She is currently married to her second husband of 27 years. She has 2 sons and one daughter. She is currently unemployed and on disability for 2  years. She lives with her husband, her daughter, and one of her daughter's friends. She denies any legal difficulties. She reports that she is spiritual but not religious. Her hobbies include reading and cross stitch. She reports that she has no social support network. 10/29/2012 [name]    DATA OBTAINED & REVIEWED:  No results for input(s): HGBA1C, LABURIC, CREATINE in the last 8760 hours. Problem  Cervical Radiculopathy   MRI cervical spine 10/20/2017: Disc degeneration from C3-4 to C6-7 with facet arthropathy mainly at C2-3.  There is a disc herniation is  mildly flattening the ventral cord at C5-6 and right-sided flattening and C6-7.   Lumbar Back Pain    OBJECTIVE:  VS:  HT:5\' 5"  (165.1 cm)   WT:179 lb (81.2 kg)  BMI:29.79    BP:140/86  HR:92bpm  TEMP: ( )  RESP:96 %   PHYSICAL EXAM: CONSTITUTIONAL: Well-developed, Well-nourished and In no acute distress PSYCHIATRIC: Alert & appropriately interactive. and Not depressed or anxious appearing. RESPIRATORY: No increased work of breathing and Trachea Midline EYES: Pupils are equal., EOM intact without nystagmus. and No scleral icterus.  VASCULAR EXAM: Warm and well perfused NEURO: unremarkable  MSK Exam: Neck and back: Stiff upright position with relatively limited cervical rotation and sidebending bilaterally.  Mild pain with Spurling's compression test and questionable radiation into the upper back with Lhermitte's compression test but this is not reproducible.  Negative Hoffmann's.  Upper extremity and lower extremity strength in all myotomes is intact.  ASSESSMENT   1. Neck pain   2. Cervical radiculopathy   3. Arthralgia, unspecified joint   4. Acute pain of left knee   5. Lumbar back pain     PLAN:  Pertinent additional documentation may be included in corresponding procedure notes, imaging studies, problem based documentation and patient instructions.  Procedures:  . None  Medications:  Meds ordered this encounter  Medications  . gabapentin (NEURONTIN) 300 MG capsule    Sig: Take 1 capsule (300 mg total) by mouth 3 (three) times daily.    Dispense:  90 capsule    Refill:  1   Discussion/Instructions: Cervical radiculopathy Referral for epidural steroid injection discussed and placed.  Gabapentin titration  Lumbar back pain Likely functional and nature.  Home exercises per procedure note.  No true radicular symptoms with neural tension testing.  . THERAPEUTIC EXERCISE: Discussed the foundation of treatment for this condition is physical therapy and/or daily  (5-6 days/week) therapeutic exercises, focusing on core strengthening, coordination, neuromuscular control/reeducation.  Home Therapeutic exercises prescribed today per procedure note. and Continue previously prescribed home exercise program.  . Referral placed To Dr. Alvester Morin for epidural steroid . Discussed red flag symptoms that warrant earlier emergent evaluation and patient voices understanding. . Activity modifications and the importance of avoiding exacerbating activities (limiting pain to no more than a 4 / 10 during or following activity) recommended and discussed. . >50% of this 25 minutes minute visit spent in direct patient counseling and/or coordination of care. Discussion was focused on education regarding the in discussing the pathoetiology and anticipated clinical course of the above condition.  Follow-up:  . Return in about 8 weeks (around 12/17/2017).  . If any lack of improvement: consider referral to Physical Therapy and consider referral to Orthopedics for Neurosurgical evaluation     CMA/ATC served as scribe during this visit. History, Physical, and Plan performed by medical provider. Documentation and orders reviewed and attested to.      Andrena Mews, DO    [facility]  [facility] Physician

## 2017-10-23 ENCOUNTER — Telehealth (INDEPENDENT_AMBULATORY_CARE_PROVIDER_SITE_OTHER): Payer: Self-pay | Admitting: *Deleted

## 2017-10-23 NOTE — Telephone Encounter (Signed)
Pt called [facility] asking about pricing for injection. Called pt back and gave her the cost of injection and code [id number] and she states she will call her insurance.

## 2017-10-25 ENCOUNTER — Telehealth: Payer: Self-pay

## 2017-10-25 MED ORDER — TIZANIDINE HCL 4 MG PO TABS: 4.0000 mg | ORAL_TABLET | Freq: Four times a day (QID) | ORAL | 0 refills | Status: DC | PRN

## 2017-10-25 NOTE — Telephone Encounter (Signed)
Prescription for tizanidine has been sent to her pharmacy.  This is the extent of the medicines that are appropriate with her other medications and medical conditions at this time.  I did discuss this with Dr. Earlene Plater and she is in agreement

## 2017-10-25 NOTE — Telephone Encounter (Signed)
Called pt and advised.  

## 2017-10-25 NOTE — Telephone Encounter (Signed)
Pt calling, she has scheduled an appt with Dr. Alvester Morin for epidural inj but she can't have it done until the 25th. She is still in a lot of pain, severe. She was wondering if we could please her something for pain for now.

## 2017-10-27 ENCOUNTER — Encounter: Payer: Self-pay | Admitting: Sports Medicine

## 2017-11-01 ENCOUNTER — Other Ambulatory Visit (INDEPENDENT_AMBULATORY_CARE_PROVIDER_SITE_OTHER): Payer: Self-pay | Admitting: Physical Medicine and Rehabilitation

## 2017-11-01 DIAGNOSIS — F411 Generalized anxiety disorder: Secondary | ICD-10-CM

## 2017-11-01 MED ORDER — DIAZEPAM 5 MG PO TABS: ORAL_TABLET | ORAL | 0 refills | Status: DC

## 2017-11-01 NOTE — Progress Notes (Signed)
Pre-procedure diazepam ordered for pre-operative anxiety.  

## 2017-11-08 ENCOUNTER — Ambulatory Visit (INDEPENDENT_AMBULATORY_CARE_PROVIDER_SITE_OTHER): Payer: Medicare Other | Admitting: Physical Medicine and Rehabilitation

## 2017-11-08 ENCOUNTER — Encounter (INDEPENDENT_AMBULATORY_CARE_PROVIDER_SITE_OTHER): Payer: Self-pay | Admitting: Physical Medicine and Rehabilitation

## 2017-11-08 ENCOUNTER — Ambulatory Visit (INDEPENDENT_AMBULATORY_CARE_PROVIDER_SITE_OTHER): Payer: Self-pay

## 2017-11-08 VITALS — BP 135/84 | HR 79 | Temp 98.4°F | Ht 65.0 in | Wt 177.0 lb

## 2017-11-08 DIAGNOSIS — M5412 Radiculopathy, cervical region: Secondary | ICD-10-CM | POA: Diagnosis not present

## 2017-11-08 MED ORDER — METHYLPREDNISOLONE ACETATE 80 MG/ML IJ SUSP
80.0000 mg | Freq: Once | INTRAMUSCULAR | Status: AC
  Administered 2017-11-08: 80 mg

## 2017-11-08 NOTE — Patient Instructions (Signed)

## 2017-11-08 NOTE — Progress Notes (Signed)
 .  Numeric Pain Rating Scale and Functional Assessment Average Pain 8 Pain Right Now 8 My pain is constant and sharp Pain is worse with: some activites Pain improves with: heat/ice   In the last MONTH (on 0-10 scale) has pain interfered with the following?  1. General activity like being  able to carry out your everyday physical activities such as walking, climbing stairs, carrying groceries, or moving a chair?  Rating(7)  2. Relation with others like being able to carry out your usual social activities and roles such as  activities at home, at work and in your community. Rating(7)  3. Enjoyment of life such that you have  been bothered by emotional problems such as feeling anxious, depressed or irritable?  Rating(7)

## 2017-11-15 NOTE — Progress Notes (Signed)
Danielle Cox - 57 y.o. female MRN [id number]  Date of birth: Feb 11, 1960  Office Visit Note: Visit Date: 11/08/2017 PCP: [name], DO Referred by: [name], DO  Subjective: Chief Complaint  Patient presents with  . Neck - Pain  . Right Shoulder - Pain  . Left Shoulder - Pain  . Middle Back - Pain   HPI:  Danielle Cox is a 57 y.o. female who comes in today At the request of Dr. Gaspar Bidding for cervical epidural injection diagnostically hopefully therapeutically.  Actually in point of fact he did ask if we could complete lumbar injection while consulting on her neck for possible epidural injection.  However, she really is complaining exclusively of neck pain referring into both shoulders and the upper back.  While we typically think of this pattern is may be being more myofascial pattern she does have some flattening and disc narrowing in the middle cervical spine on MRI.  She is failed conservative care including physical treatment with Dr. Berline Chough as well as medication management.  She could have more of a C5 distribution into the shoulders and upper back and rhomboids.  She will really does not report anything into the hands.  She reports movement making this worse.  Her pain is 8 out of 10 and does affect her daily activities.  MRI was reviewed with the patient along with spine models.  Her case is complicated by multiple behavioral health comorbidities.  She does want to proceed today with cervical epidural injection.  If this does not give her more than 50 to 60% relief then I am not sure this is related to the cervical spine and I think continued conservative care or perhaps a comprehensive pain management approach with psychological support would be beneficial.  Brief exam shows that the patient has very stiff movement of the cervical spine reduced range of motion to the pain.  Tenderness across the spinous process at C7 in the trapezius bilaterally.  No shoulder impingement.   She has good strength in the upper extremities bilaterally without deficit.  Negative Hoffman's test.  ROS Otherwise per HPI.  Assessment & Plan: Visit Diagnoses:  1. Cervical radiculopathy     Plan: No additional findings.   Meds & Orders:  Meds ordered this encounter  Medications  . methylPREDNISolone acetate (DEPO-MEDROL) injection 80 mg    Orders Placed This Encounter  Procedures  . XR C-ARM NO REPORT  . Epidural Steroid injection    Follow-up: Return if symptoms worsen or fail to improve.   Procedures: No procedures performed  Cervical Epidural Steroid Injection - Interlaminar Approach with Fluoroscopic Guidance  Patient: Danielle Cox      Date of Birth: [birth year] MRN: [id number] PCP: [name], DO      Visit Date: 11/08/2017   Universal Protocol:    Date/Time: 11/01/195:32 AM  Consent Given By: the patient  Position: PRONE  Additional Comments: Vital signs were monitored before and after the procedure. Patient was prepped and draped in the usual sterile fashion. The correct patient, procedure, and site was verified.   Injection Procedure Details:  Procedure Site One Meds Administered:  Meds ordered this encounter  Medications  . methylPREDNISolone acetate (DEPO-MEDROL) injection 80 mg     Laterality: Right  Location/Site: C7-T1  Needle size: 20 G  Needle type: Touhy  Needle Placement: Paramedian epidural space  Findings:  -Comments: Excellent flow of contrast into the epidural space.  The first we had a hard time  positioning the patient and she wanted to lean to the left off of our [employer] positioning table.  It did make the counter oblique view difficult to see and ultimately we did use a lateral projection on the fluoroscope and could see pretty good flow of contrast that clearly was epidural.  Procedure Details: Using a paramedian approach from the side mentioned above, the region overlying the inferior lamina was localized under  fluoroscopic visualization and the soft tissues overlying this structure were infiltrated with 4 ml. of 1% Lidocaine without Epinephrine. A # 20 gauge, Tuohy needle was inserted into the epidural space using a paramedian approach.  The epidural space was localized using loss of resistance along with lateral and contralateral oblique bi-planar fluoroscopic views.  After negative aspirate for air, blood, and CSF, a 2 ml. volume of Isovue-250 was injected into the epidural space and the flow of contrast was observed. Radiographs were obtained for documentation purposes.   The injectate was administered into the level noted above.  Additional Comments:  The patient tolerated the procedure well Dressing: Band-Aid    Post-procedure details: Patient was observed during the procedure. Post-procedure instructions were reviewed.  Patient left the clinic in stable condition.    Clinical History: MRI CERVICAL SPINE WITHOUT CONTRAST  TECHNIQUE: Multiplanar, multisequence MR imaging of the cervical spine was performed. No intravenous contrast was administered.  COMPARISON:  10/07/2017 is the only available comparison.  FINDINGS: Alignment: Reversal of cervical lordosis  Vertebrae: No fracture, evidence of discitis, or bone lesion.  Cord: Normal signal and morphology.  Posterior Fossa, vertebral arteries, paraspinal tissues: Negative.  Disc levels:  C2-3: Facet spurring asymmetric to the left. Shallow central protrusion. No impingement  C3-4: Disc narrowing with small endplate and uncovertebral spurs. Negative facets. Mild bilateral foraminal narrowing  C4-5: Disc narrowing with endplate and uncovertebral ridging. Negative facets. Patent canal and foramina with mild bilateral foraminal narrowing.  C5-6: Disc narrowing with endplate and uncovertebral ridging. There is a left paracentral protrusion mildly deforming the ventral cord. Bilateral foraminal narrowing is mild,  borderline moderate on the left.  C6-7: Disc narrowing and bulging with a right paracentral protrusion and buttressing osteophyte. This contacts the right ventral cord with mild flattening. The foramina are patent.  C7-T1:Unremarkable.  IMPRESSION: 1. Disc degeneration from C3-4 to C6-7. Facet arthropathy mainly at C2-3. 2. Disc herniations mildly flatten the ventral cord at C5-6 and on the right at C6-7. 3. Mild foraminal narrowings on the symptomatic right side as above.   Electronically Signed   By: Marnee Spring M.D.   On: 10/20/2017 22:27     Objective:  VS:  HT:5\' 5"  (165.1 cm)   WT:177 lb (80.3 kg)  BMI:29.45    BP:135/84  HR:79bpm  TEMP:98.4 F (36.9 C)(Oral)  RESP:95 % Physical Exam  Ortho Exam Imaging: No results found.

## 2017-11-15 NOTE — Procedures (Signed)
Cervical Epidural Steroid Injection - Interlaminar Approach with Fluoroscopic Guidance  Patient: Danielle Cox      Date of Birth: 11/26/1960 MRN: [id number] PCP: [name], DO      Visit Date: 11/08/2017   Universal Protocol:    Date/Time: 11/01/195:32 AM  Consent Given By: the patient  Position: PRONE  Additional Comments: Vital signs were monitored before and after the procedure. Patient was prepped and draped in the usual sterile fashion. The correct patient, procedure, and site was verified.   Injection Procedure Details:  Procedure Site One Meds Administered:  Meds ordered this encounter  Medications  . methylPREDNISolone acetate (DEPO-MEDROL) injection 80 mg     Laterality: Right  Location/Site: C7-T1  Needle size: 20 G  Needle type: Touhy  Needle Placement: Paramedian epidural space  Findings:  -Comments: Excellent flow of contrast into the epidural space.  The first we had a hard time positioning the patient and she wanted to lean to the left off of our [employer] positioning table.  It did make the counter oblique view difficult to see and ultimately we did use a lateral projection on the fluoroscope and could see pretty good flow of contrast that clearly was epidural.  Procedure Details: Using a paramedian approach from the side mentioned above, the region overlying the inferior lamina was localized under fluoroscopic visualization and the soft tissues overlying this structure were infiltrated with 4 ml. of 1% Lidocaine without Epinephrine. A # 20 gauge, Tuohy needle was inserted into the epidural space using a paramedian approach.  The epidural space was localized using loss of resistance along with lateral and contralateral oblique bi-planar fluoroscopic views.  After negative aspirate for air, blood, and CSF, a 2 ml. volume of Isovue-250 was injected into the epidural space and the flow of contrast was observed. Radiographs were obtained for documentation  purposes.   The injectate was administered into the level noted above.  Additional Comments:  The patient tolerated the procedure well Dressing: Band-Aid    Post-procedure details: Patient was observed during the procedure. Post-procedure instructions were reviewed.  Patient left the clinic in stable condition.

## 2017-11-20 ENCOUNTER — Ambulatory Visit: Payer: Medicare Other | Admitting: Family Medicine

## 2017-11-20 ENCOUNTER — Ambulatory Visit (INDEPENDENT_AMBULATORY_CARE_PROVIDER_SITE_OTHER): Payer: Medicare Other

## 2017-11-20 ENCOUNTER — Encounter: Payer: Self-pay | Admitting: Family Medicine

## 2017-11-20 VITALS — BP 132/88 | HR 99 | Temp 98.3°F | Ht 65.0 in | Wt 177.6 lb

## 2017-11-20 DIAGNOSIS — I10 Essential (primary) hypertension: Secondary | ICD-10-CM

## 2017-11-20 DIAGNOSIS — J449 Chronic obstructive pulmonary disease, unspecified: Secondary | ICD-10-CM

## 2017-11-20 DIAGNOSIS — R05 Cough: Secondary | ICD-10-CM

## 2017-11-20 DIAGNOSIS — F17200 Nicotine dependence, unspecified, uncomplicated: Secondary | ICD-10-CM | POA: Diagnosis not present

## 2017-11-20 DIAGNOSIS — M797 Fibromyalgia: Secondary | ICD-10-CM

## 2017-11-20 DIAGNOSIS — R059 Cough, unspecified: Secondary | ICD-10-CM

## 2017-11-20 MED ORDER — BACLOFEN 20 MG PO TABS: 20.0000 mg | ORAL_TABLET | Freq: Every evening | ORAL | 0 refills | Status: DC | PRN

## 2017-11-20 NOTE — Progress Notes (Signed)
Danielle Cox is a 57 y.o. female here for an acute visit.  History of Present Illness:   HPI: See Assessment and Plan section for Problem Based Charting of issues discussed today.   PMHx, SurgHx, SocialHx, Medications, and Allergies were reviewed in the Visit Navigator and updated as appropriate.  Current Medications:   .  budesonide-formoterol (SYMBICORT) 160-4.5 MCG/ACT inhaler, Inhale 2 puffs into the lungs 2 (two) times daily., Disp: 1 Inhaler, Rfl: 3 .  carbamazepine (TEGRETOL XR) 200 MG 12 hr tablet, Take 3 tablets (600 mg total) by mouth 2 (two) times daily. For mood stability, Disp: 180 tablet, Rfl: 0 .  cyclobenzaprine (FLEXERIL) 5 MG tablet, Take 2 tablets (10 mg total) by mouth 3 (three) times daily as needed for muscle spasms., Disp: 20 tablet, Rfl: 0 .  diazepam (VALIUM) 5 MG tablet, Take 1 by mouth 1 hour  pre-procedure with very light food., Disp: 1 tablet, Rfl: 0 .  fluticasone (FLONASE) 50 MCG/ACT nasal spray, INSTILL 2 SPRAYS INTO EACH NOSTRIL DAILY, Disp: 16 g, Rfl: 4 .  gabapentin (NEURONTIN) 300 MG capsule, Take 1 capsule (300 mg total) by mouth 3 (three) times daily., Disp: 90 capsule, Rfl: 1 .  ketorolac (TORADOL) 10 MG tablet, Take 1 tablet (10 mg total) by mouth every 6 (six) hours as needed., Disp: 20 tablet, Rfl: 0 .  PARoxetine (PAXIL) 20 MG tablet, , Disp: , Rfl:  .  tiZANidine (ZANAFLEX) 4 MG tablet, Take 1 tablet (4 mg total) by mouth every 6 (six) hours as needed for muscle spasms., Disp: 30 tablet, Rfl: 0 .  traZODone (DESYREL) 150 MG tablet, , Disp: , Rfl:    Allergies  Allergen Reactions  . Moxifloxacin Hcl Palpitations    REACTION: tackycardia  . Penicillins Rash    Has patient had a PCN reaction causing immediate rash, facial/tongue/throat swelling, SOB or lightheadedness with hypotension: Yes Has patient had a PCN reaction causing severe rash involving mucus membranes or skin necrosis: No Has patient had a PCN reaction that required  hospitalization: No Has patient had a PCN reaction occurring within the last 10 years: No If all of the above answers are "NO", then may proceed with Cephalosporin use.    Review of Systems:   Pertinent items are noted in the HPI. Otherwise, ROS is negative.  Vitals:   Vitals:   11/20/17 1519  BP: 132/88  Pulse: 99  Temp: 98.3 F (36.8 C)  TempSrc: Oral  SpO2: 96%  Weight: 177 lb 9.6 oz (80.6 kg)  Height: 5\' 5"  (1.651 m)     Body mass index is 29.55 kg/m.  Physical Exam:   Physical Exam  Constitutional: She appears well-nourished.  HENT:  Head: Normocephalic and atraumatic.  Eyes: Pupils are equal, round, and reactive to light. EOM are normal.  Neck: Normal range of motion. Neck supple.  Cardiovascular: Normal rate, regular rhythm, normal heart sounds and intact distal pulses.  Pulmonary/Chest: Effort normal. No respiratory distress. She has decreased breath sounds. She has no wheezes. She has no rhonchi.  Abdominal: Soft.  Skin: Skin is warm.  Psychiatric: Her behavior is normal. She exhibits a depressed mood.  Nursing note and vitals reviewed.  Dg Chest 2 View  Result Date: 11/21/2017 CLINICAL DATA:  Dry cough, congestion and shortness of breath for 2 months. History of COPD. EXAM: CHEST - 2 VIEW COMPARISON:  PA and lateral chest 03/02/2016 and 05/26/2008. CT chest 05/26/2008. FINDINGS: Peribronchial thickening and mild pulmonary hyperexpansion are unchanged. No  consolidative process, pneumothorax or effusion. Heart size is normal. No acute or focal bony abnormality. Thoracolumbar scoliosis noted. IMPRESSION: No acute disease. Findings compatible with COPD. Electronically Signed   By: Drusilla Kanner M.D.   On: 11/21/2017 09:00   Assessment and Plan:   Current every day smoker Smoking cessation counseling was provided.  Approximately 5 minutes were spent discussing the rationale for tobacco cessation and strategies for doing so.  Adjuncts, including nicotine patches  and nicotine lozenges were recommended. Patient is eligible for low-dose CT scan for lung cancer screening based on the criteria. She declines at this time.  Fibromyalgia Patient has a several year history of diffuse soft tissue pain, chronic fatigue, and refractory depression. The symptoms are of diffuse and intermittent severity. They are made worse by: movement. They are helped by rest. The patient denies fever, truly inflamed joints, rash, localizing neurologic symptoms, unexplained weight loss. Previous treatments include OTC meds. Evaluation to date includes previous labs reviewed and in note. Recent interventions include no specific treatment yet undertaken. Limitation on activities include none. Patient is on disability.  Symptoms noted today likely due to fibromyalgia rather than other serious disease.  Plan:  1. Discussed fibromyalgia with patient along with my philosophy of management. 2. Agricultural engineer distributed. 3. Started Baclofen per orders.  Cough Patient complains of sore throat and cough. Symptoms began 2 weeks ago. Symptoms have been waxing and waning since that time.The cough is dry and is aggravated by exercise and reclining position. Associated symptoms include: none. Patient does not have a history of asthma. Patient does not have a history of environmental allergens. Patient has not traveled recently. Patient does have a history of smoking.   Dg Chest 2 View: Peribronchial thickening and mild pulmonary hyperexpansion are unchanged. No consolidative process, pneumothorax or effusion. Heart size is normal. No acute or focal bony abnormality. Thoracolumbar scoliosis noted. IMPRESSION: No acute disease. Findings compatible with COPD. Electronically Signed   By: Drusilla Kanner M.D.   On: 11/21/2017 09:00   COPD (chronic obstructive pulmonary disease) (HCC) Smoking cessation instruction/counseling given:  counseled patient on the dangers of tobacco use, advised patient  to stop smoking, and reviewed strategies to maximize success.  Orders Placed This Encounter  Procedures  . DG Chest 2 View   Meds ordered this encounter  Medications  . baclofen (LIORESAL) 20 MG tablet    Sig: Take 1 tablet (20 mg total) by mouth at bedtime as needed for muscle spasms.    Dispense:  30 each    Refill:  0   . Reviewed expectations re: course of current medical issues. . Discussed self-management of symptoms. . Outlined signs and symptoms indicating need for more acute intervention. . Patient verbalized understanding and all questions were answered. Marland Kitchen Health Maintenance issues including appropriate healthy diet, exercise, and smoking avoidance were discussed with patient. . See orders for this visit as documented in the electronic medical record. . Patient received an After Visit Summary.  [name], DO [place], [facility] 11/21/2017

## 2017-11-20 NOTE — Patient Instructions (Signed)
With chronic myofascial pain, Effexor may be worth trying again. Please ask your Psychiatrist if this or another similar medication would be a safe replacement for Paxil.

## 2017-11-21 DIAGNOSIS — M797 Fibromyalgia: Secondary | ICD-10-CM | POA: Insufficient documentation

## 2017-11-21 DIAGNOSIS — J449 Chronic obstructive pulmonary disease, unspecified: Secondary | ICD-10-CM | POA: Insufficient documentation

## 2017-11-21 HISTORY — DX: Fibromyalgia: M79.7

## 2017-11-21 NOTE — Assessment & Plan Note (Signed)
Smoking cessation instruction/counseling given:  counseled patient on the dangers of tobacco use, advised patient to stop smoking, and reviewed strategies to maximize success 

## 2017-11-21 NOTE — Assessment & Plan Note (Signed)
Smoking cessation counseling was provided.  Approximately 5 minutes were spent discussing the rationale for tobacco cessation and strategies for doing so.  Adjuncts, including nicotine patches and nicotine lozenges were recommended. Patient is eligible for low-dose CT scan for lung cancer screening based on the criteria. She declines at this time.

## 2017-11-21 NOTE — Assessment & Plan Note (Addendum)
Patient has a several year history of diffuse soft tissue pain, chronic fatigue, and refractory depression. The symptoms are of diffuse and intermittent severity. They are made worse by: movement. They are helped by rest. The patient denies fever, truly inflamed joints, rash, localizing neurologic symptoms, unexplained weight loss. Previous treatments include OTC meds. Evaluation to date includes previous labs reviewed and in note. Recent interventions include no specific treatment yet undertaken. Limitation on activities include none. Patient is on disability.  Symptoms noted today likely due to fibromyalgia rather than other serious disease.  Plan:  1. Discussed fibromyalgia with patient along with my philosophy of management. 2. Agricultural engineer distributed. 3. Started Baclofen per orders.

## 2017-11-21 NOTE — Assessment & Plan Note (Signed)
Patient complains of sore throat and cough. Symptoms began 2 weeks ago. Symptoms have been waxing and waning since that time.The cough is dry and is aggravated by exercise and reclining position. Associated symptoms include: none. Patient does not have a history of asthma. Patient does not have a history of environmental allergens. Patient has not traveled recently. Patient does have a history of smoking.   Dg Chest 2 View: Peribronchial thickening and mild pulmonary hyperexpansion are unchanged. No consolidative process, pneumothorax or effusion. Heart size is normal. No acute or focal bony abnormality. Thoracolumbar scoliosis noted. IMPRESSION: No acute disease. Findings compatible with COPD. Electronically Signed   By: Drusilla Kanner M.D.   On: 11/21/2017 09:00

## 2017-11-22 ENCOUNTER — Telehealth: Payer: Self-pay

## 2017-11-22 NOTE — Telephone Encounter (Signed)
Pt calling because she saw Dr. Alvester Morin 2 weeks ago and had epidural injection. She reports that she got no relief from this injections, she continues to have pain in her neck, shoulders, spine, and lower back. She saw Dr. Earlene Plater Wednesday and was prescribed Baclofen, she has gotten no relief. She would like a call back because she is not sure what else to do.

## 2017-11-25 NOTE — Telephone Encounter (Signed)
Spoke with patient, she isn't sure if she should move up her appointment with Dr. Berline Chough because she isn't sure if there is anything else that he could possibly do. She is having piercing pain in her neck, shoulders, spine and lower back. She has gotten no relief with Baclofen.   Forwarding to Dr. Berline Chough to advise.

## 2017-11-25 NOTE — Telephone Encounter (Signed)
Happy to refer her to neurosurgery if she would like.  Sent to [facility] if she does not have any preference.

## 2017-11-26 NOTE — Telephone Encounter (Signed)
Scan for lung cancer screening. Okay to refer to Pulmonology if she agrees to it.

## 2017-11-26 NOTE — Telephone Encounter (Signed)
Per Dr. Berline Chough, can come in to discuss options with him but he would only recommended being referral to neurosurgery or possibly contacting Dr. [place] [facility] office to see if it might be beneficial to repeat the epidural injection.

## 2017-11-26 NOTE — Telephone Encounter (Signed)
Spoke with pt, she declines referral at this time. She says that Dr. Earlene Plater mentioned something about a CAT scan, I reviewed note and saw rec of CT lung cancer screening. Will touch base to Dr. Berline Chough and Dr. Earlene Plater regarding this. Pt said she thought it was at CT scan with contrast to "check everything out".

## 2017-11-26 NOTE — Telephone Encounter (Signed)
Called pt and advised. She will hold off on all referrals for now. She needs some time to think about things because she is in a lot of pain and doesn't know what she can do. I told her that she can reach out to Dr. [place] [facility] office directly to see if Dr. Alvester Morin thinks another injection or she can call me back if she would like to proceed with the referral to neurosurgery. Pt verbalized understanding. No further action required at this time.

## 2017-11-27 ENCOUNTER — Telehealth (INDEPENDENT_AMBULATORY_CARE_PROVIDER_SITE_OTHER): Payer: Self-pay | Admitting: Physical Medicine and Rehabilitation

## 2017-11-27 NOTE — Telephone Encounter (Signed)
Patient called to triage this afternoon. She states that she is having headaches, cannot sleep at night, is up and cleaning all hours of the night, and has numbness around her mouth.  She is unsure if this is related to the injection or if it is a side effect of the Abilify she was recently put on.  She has called her psychiatrist as well, but has not received a call back from them. I advised patient if she is having numbness and severe headache, she should be seen in the ED. She states that she is going to take another Benadryl and will go if she gets worse. I did advise we would have Dr. Alvester [place]inNewton address message in the morning.

## 2017-11-28 NOTE — Telephone Encounter (Signed)
The symptoms she is having are not indicative of an allergic reaction.  Allergic reactions are fairly immediate and would be looking at more of angioedema or rash etc.  I reviewed the fluoroscopic images and this shows good flow of contrast without any issues from a procedural standpoint.  Obviously steroid type medication can get absorbed and if any of this was intravascular she could have had a euphoria usually for a short while but anybody with underlying behavioral health diagnoses may get that for more of a lasting timeframe.  It can cause a mania or euphoria from a steroid side effects.  She should follow-up with her psychiatrist and also look at the potential issues with Abilify as that something out of my realm of expertise.  The numbness around the mouth if it were nerve related is a cranial nerve issue and would not be related to the cervical spine.  I do think if she is concerned I would rather have her seen at the emergency department but I do not feel like this is related to any procedural component of the injection.  I am also happy to see her in follow-up as well.

## 2017-11-28 NOTE — Telephone Encounter (Signed)
Please see message below.   Thanks

## 2017-11-28 NOTE — Telephone Encounter (Signed)
Called patient to advise. She did not go to urgent care or the ED. She does have a follow up appointment with her psychiatrist on 12/2, and she is waiting for a call back from them regarding her symptoms.

## 2017-11-29 ENCOUNTER — Other Ambulatory Visit: Payer: Self-pay | Admitting: Family Medicine

## 2017-11-29 ENCOUNTER — Telehealth (INDEPENDENT_AMBULATORY_CARE_PROVIDER_SITE_OTHER): Payer: Self-pay | Admitting: *Deleted

## 2017-11-29 ENCOUNTER — Telehealth: Payer: Self-pay | Admitting: Physical Therapy

## 2017-11-29 ENCOUNTER — Other Ambulatory Visit (INDEPENDENT_AMBULATORY_CARE_PROVIDER_SITE_OTHER): Payer: Self-pay | Admitting: Physical Medicine and Rehabilitation

## 2017-11-29 DIAGNOSIS — Z1231 Encounter for screening mammogram for malignant neoplasm of breast: Secondary | ICD-10-CM

## 2017-11-29 MED ORDER — DICLOFENAC SODIUM 75 MG PO TBEC: 75.0000 mg | DELAYED_RELEASE_TABLET | Freq: Two times a day (BID) | ORAL | 0 refills | Status: DC

## 2017-11-29 NOTE — Telephone Encounter (Signed)
Pt called and left a VM on our back line number stating that she was having a manic episode associated w/ her bipolar depression.  Pt states that she feels like these issues are associated w/ the lumbar epidural she had w/ Dr. Alvester MorinNewton on 11/08/17.  She states that she called Dr. Winslow West BlasNewton's office and that they weren't very helpful and didn't feel that her symptoms were due to the epidural.  Pt reports that his office told her that she could go to the ED if her symptoms were that bad.  Pt states that she is also suffering from a very bad HA and would like to know if Dr. Berline Choughigby would prescribe her something for her HA.

## 2017-11-29 NOTE — Progress Notes (Signed)
Diclofenac for headache, total of 7 days and hydration. F/up in office if no relief and f/up with psychiatrist for increase mania due to corticosteroid.

## 2017-11-29 NOTE — Telephone Encounter (Signed)
See my note

## 2017-11-29 NOTE — Telephone Encounter (Signed)
Called pt and advised.  

## 2017-11-29 NOTE — Telephone Encounter (Signed)
After speaking to Dr. Berline Choughigby, returned the pt's called and advised her to call her psychiatrist regarding medication for her HA.  She states that she had already spoken to her psychiatrist and that she increased her Abilify.  I advised her to call her psychiatrist again and specifically ask her for something for the HA as Dr. Berline Choughigby doesn't feel comfortable filling a script for her HA given her psychiatric hx and current manic episode.

## 2017-12-02 LAB — COLOGUARD: Cologuard: NEGATIVE

## 2017-12-17 ENCOUNTER — Other Ambulatory Visit: Payer: Self-pay | Admitting: Family Medicine

## 2017-12-17 ENCOUNTER — Ambulatory Visit: Payer: Self-pay | Admitting: Family Medicine

## 2017-12-17 ENCOUNTER — Encounter: Payer: Self-pay | Admitting: Sports Medicine

## 2017-12-17 ENCOUNTER — Ambulatory Visit: Payer: Medicare Other | Admitting: Sports Medicine

## 2017-12-17 VITALS — BP 124/78 | HR 95 | Ht 65.0 in | Wt 178.8 lb

## 2017-12-17 DIAGNOSIS — M5412 Radiculopathy, cervical region: Secondary | ICD-10-CM | POA: Diagnosis not present

## 2017-12-17 DIAGNOSIS — M797 Fibromyalgia: Secondary | ICD-10-CM

## 2017-12-17 DIAGNOSIS — F314 Bipolar disorder, current episode depressed, severe, without psychotic features: Secondary | ICD-10-CM | POA: Diagnosis not present

## 2017-12-17 DIAGNOSIS — R519 Headache, unspecified: Secondary | ICD-10-CM

## 2017-12-17 DIAGNOSIS — F17200 Nicotine dependence, unspecified, uncomplicated: Secondary | ICD-10-CM

## 2017-12-17 DIAGNOSIS — M25562 Pain in left knee: Secondary | ICD-10-CM

## 2017-12-17 DIAGNOSIS — M542 Cervicalgia: Secondary | ICD-10-CM | POA: Diagnosis not present

## 2017-12-17 DIAGNOSIS — R51 Headache: Secondary | ICD-10-CM

## 2017-12-17 MED ORDER — DICLOFENAC SODIUM 1 % TD GEL: TRANSDERMAL | 1 refills | Status: DC

## 2017-12-17 NOTE — Telephone Encounter (Signed)
Ok to refill given 3 weeks ago?

## 2017-12-17 NOTE — Progress Notes (Signed)
Danielle Cox. Danielle Cox [facility] at [facility] [phone]  Danielle Cox - 57 y.o. female MRN [id number]  Date of birth: 1960-01-30  Visit Date: 12/17/2017   PCP: [name], DO   Referred by: [name], DO   SUBJECTIVE:  Danielle Cox is here for Follow-up (Neck and back pain)   HPI: Patient is here for follow-up of ongoing neck and upper back pain.  She reports pain continues to be severe at times and radiates into her bilateral shoulders.  She continues to have finger numbness and tingling.  Bilateral hip and knee pain is significant in the left.  And right knee.  She has been taking gabapentin 300 mg 3 times per day.  She is also taking aspirin as well as a muscle relaxer that has not been significantly beneficial.  She has been performing her home therapeutic exercise program including pelvic tilts, knee rocks inside side rotation.  The epidural steroid with Dr. Alvester Morin was on 12/02/2017 with only minimal improvement in her symptoms.  She has had worsening headaches as well as thrush type symptoms.  REVIEW OF SYSTEMS: Denies night time disturbances. Denies fevers, chills, or night sweats. Denies unexplained weight loss. Denies personal history of cancer. Denies changes in bowel or bladder habits. Denies recent unreported falls. Denies new or worsening dyspnea or wheezing. Denies headaches or dizziness.  Denies numbness, tingling or weakness  In the extremities.  Denies dizziness or presyncopal episodes Denies lower extremity edema    HISTORY:  Prior history reviewed and updated per electronic medical record.  Patient Active Problem List   Diagnosis Date Noted  . Obesity (BMI 30.0-34.9) 02/12/2018  . Cervical radiculopathy 12/29/2017    MRI cervical spine 10/20/2017: Disc degeneration from C3-4 to C6-7 with facet arthropathy mainly at C2-3.  There is a disc herniation is mildly flattening the ventral cord at C5-6 and  right-sided flattening and C6-7.   . Lumbar back pain 12/29/2017  . Fibromyalgia 11/21/2017  . COPD (chronic obstructive pulmonary disease) (HCC) 11/21/2017  . Affective psychosis, bipolar (HCC)   . Major depressive disorder, recurrent severe without psychotic features (HCC) 12/13/2016  . Bipolar 1 disorder, depressed, severe (HCC) 11/20/2016    Class: Chronic  . Overweight (BMI 25.0-29.9) 03/02/2016  . Personality disorder (HCC) 04/23/2013  . Current every day smoker 02/17/2009  . Essential hypertension 02/17/2009  . Cough 02/17/2009   Social History   Occupational History  . Not on file  Tobacco Use  . Smoking status: Current Every Day Smoker    Packs/day: 1.00    Years: 40.00    Pack years: 40.00    Types: Cigarettes  . Smokeless tobacco: Never Used  Substance and Sexual Activity  . Alcohol use: No  . Drug use: No  . Sexual activity: Not Currently    Partners: Male    Birth control/protection: None   Social History   Social History Narrative   10/29/2012 Danielle Cox was born and grew up in [place], [place]. She has 2 brothers and one sister. She reports that her childhood was "poor," and explains that she needs both financially and emotionally. She completed the 10th grade, then achieved her GED. She has been married twice. The first time at age 29, and that ended after 1 year. She is currently married to her second husband of 27 years. She has 2 sons and one daughter. She is currently unemployed and on disability for 2 years. She lives with  her husband, her daughter, and one of her daughter's friends. She denies any legal difficulties. She reports that she is spiritual but not religious. Her hobbies include reading and cross stitch. She reports that she has no social support network. 10/29/2012 Danielle    OBJECTIVE:  VS:  HT:5\' 5"  (165.1 cm)   WT:178 lb 12.8 oz (81.1 kg)  BMI:29.75    BP:124/78  HR:95bpm  TEMP: ( )  RESP:96 %   PHYSICAL EXAM:  Patient is slightly manic  today.  She has Stream of thought consciousness. She has pain with cervical range of motion that is diffuse.  No focal symptoms with Spurling's compression test or Lhermitte's compression test.  Extraocular muscles are intact.   ASSESSMENT:   1. Fibromyalgia   2. Neck pain   3. Cervical radiculopathy   4. Bipolar 1 disorder, depressed, severe (HCC)   5. Current every day smoker   6. Acute pain of left knee   7. Daily headache     PROCEDURES:  None  PLAN:  Pertinent additional documentation may be included in corresponding procedure notes, imaging studies, problem based documentation and patient instructions.  Ultimately her neck pain is slightly improved from an epidural steroid injection that she had but she is actually had worsening thrush as well as now chronic daily headaches.  This is compounded by her underlying psychiatric conditions as well as fibromyalgia.  At this time she is not interested and unable to go to physical therapy due to the cost.  She is taken herself off of the muscle relaxers and anti-inflammatories due to lack of improvement.  Since doing so however she has noted that her left knee is worsening and this is likely compounded by manic episode that is causing her to be on her feet more.  We will plan to have her start on Voltaren gel and will place a referral to neurology for evaluation of the chronic daily headaches that she has been having.  She does report these were present prior to the epidural but worse since the epidural.  Ultimately she continues to have any lack of improvement neurosurgical evaluation could be considered although she like to try to avoid this if possible.  Continue previously prescribed home exercise program.   Activity modifications and the importance of avoiding exacerbating activities (limiting pain to no more than a 4 / 10 during or following activity) recommended and discussed.  Discussed red flag symptoms that warrant earlier  emergent evaluation and patient voices understanding.   Meds ordered this encounter  Medications  . DISCONTD: diclofenac sodium (VOLTAREN) 1 % GEL    Sig: Apply topically to affected area qid    Dispense:  100 g    Refill:  1   Lab Orders  No laboratory test(s) ordered today   Imaging Orders  No imaging studies ordered today   Referral Orders     Ambulatory referral to Neurology  Return if symptoms worsen or fail to improve.          Andrena MewsMichael D Luca Dyar, DO    [facility]

## 2017-12-18 ENCOUNTER — Encounter: Payer: Self-pay | Admitting: Neurology

## 2017-12-29 ENCOUNTER — Encounter: Payer: Self-pay | Admitting: Sports Medicine

## 2017-12-29 DIAGNOSIS — M5412 Radiculopathy, cervical region: Secondary | ICD-10-CM | POA: Insufficient documentation

## 2017-12-29 DIAGNOSIS — M545 Low back pain, unspecified: Secondary | ICD-10-CM | POA: Insufficient documentation

## 2017-12-29 NOTE — Assessment & Plan Note (Signed)
Likely functional and nature.  Home exercises per procedure note.  No true radicular symptoms with neural tension testing.

## 2017-12-29 NOTE — Assessment & Plan Note (Signed)
Referral for epidural steroid injection discussed and placed.  Gabapentin titration

## 2017-12-29 NOTE — Progress Notes (Signed)

## 2017-12-31 ENCOUNTER — Ambulatory Visit: Payer: Medicare Other | Admitting: Family Medicine

## 2017-12-31 VITALS — BP 130/76 | HR 98 | Temp 98.6°F | Ht 65.0 in | Wt 181.4 lb

## 2017-12-31 DIAGNOSIS — M545 Low back pain, unspecified: Secondary | ICD-10-CM

## 2017-12-31 DIAGNOSIS — R102 Pelvic and perineal pain: Secondary | ICD-10-CM

## 2017-12-31 DIAGNOSIS — R51 Headache: Secondary | ICD-10-CM

## 2017-12-31 DIAGNOSIS — F17200 Nicotine dependence, unspecified, uncomplicated: Secondary | ICD-10-CM

## 2017-12-31 DIAGNOSIS — M797 Fibromyalgia: Secondary | ICD-10-CM

## 2017-12-31 DIAGNOSIS — R519 Headache, unspecified: Secondary | ICD-10-CM

## 2017-12-31 DIAGNOSIS — R350 Frequency of micturition: Secondary | ICD-10-CM

## 2017-12-31 DIAGNOSIS — M5412 Radiculopathy, cervical region: Secondary | ICD-10-CM

## 2017-12-31 DIAGNOSIS — G8929 Other chronic pain: Secondary | ICD-10-CM

## 2017-12-31 LAB — CBC WITH DIFFERENTIAL/PLATELET
Basophils Absolute: 0.1 10*3/uL (ref 0.0–0.1)
Basophils Relative: 0.8 % (ref 0.0–3.0)
Eosinophils Absolute: 0.2 10*3/uL (ref 0.0–0.7)
Eosinophils Relative: 2.1 % (ref 0.0–5.0)
HCT: 47 % — ABNORMAL HIGH (ref 36.0–46.0)
Hemoglobin: 16.3 g/dL — ABNORMAL HIGH (ref 12.0–15.0)
Lymphocytes Relative: 20.7 % (ref 12.0–46.0)
Lymphs Abs: 1.6 10*3/uL (ref 0.7–4.0)
MCHC: 34.6 g/dL (ref 30.0–36.0)
MCV: 97.2 fl (ref 78.0–100.0)
Monocytes Absolute: 0.6 10*3/uL (ref 0.1–1.0)
Monocytes Relative: 7.2 % (ref 3.0–12.0)
Neutro Abs: 5.5 10*3/uL (ref 1.4–7.7)
Neutrophils Relative %: 69.2 % (ref 43.0–77.0)
Platelets: 286 10*3/uL (ref 150.0–400.0)
RBC: 4.84 Mil/uL (ref 3.87–5.11)
RDW: 13 % (ref 11.5–15.5)
WBC: 7.9 10*3/uL (ref 4.0–10.5)

## 2017-12-31 LAB — COMPREHENSIVE METABOLIC PANEL
ALT: 27 U/L (ref 0–35)
AST: 13 U/L (ref 0–37)
Albumin: 4.1 g/dL (ref 3.5–5.2)
Alkaline Phosphatase: 70 U/L (ref 39–117)
BUN: 13 mg/dL (ref 6–23)
CO2: 31 mEq/L (ref 19–32)
Calcium: 9.8 mg/dL (ref 8.4–10.5)
Chloride: 99 mEq/L (ref 96–112)
Creatinine, Ser: 0.67 mg/dL (ref 0.40–1.20)
GFR: 96.93 mL/min (ref >60.00)
Glucose, Bld: 116 mg/dL — ABNORMAL HIGH (ref 70–99)
Potassium: 3.9 mEq/L (ref 3.5–5.1)
Sodium: 137 mEq/L (ref 135–145)
Total Bilirubin: 0.2 mg/dL (ref 0.2–1.2)
Total Protein: 6.3 g/dL (ref 6.0–8.3)

## 2017-12-31 LAB — URINALYSIS, ROUTINE W REFLEX MICROSCOPIC
Bilirubin Urine: NEGATIVE
Hgb urine dipstick: NEGATIVE
Ketones, ur: NEGATIVE
Leukocytes, UA: NEGATIVE
Nitrite: NEGATIVE
RBC / HPF: NONE SEEN (ref 0–?)
Specific Gravity, Urine: 1.015 (ref 1.000–1.030)
Total Protein, Urine: NEGATIVE
Urine Glucose: NEGATIVE
Urobilinogen, UA: 0.2 (ref 0.0–1.0)
pH: 6 (ref 5.0–8.0)

## 2017-12-31 LAB — POC URINALSYSI DIPSTICK (AUTOMATED)
Bilirubin, UA: NEGATIVE
Blood, UA: NEGATIVE
Glucose, UA: NEGATIVE
Ketones, UA: NEGATIVE
Leukocytes, UA: NEGATIVE
Nitrite, UA: NEGATIVE
Protein, UA: NEGATIVE
Spec Grav, UA: 1.02 (ref 1.010–1.025)
Urobilinogen, UA: 0.2 E.U./dL
pH, UA: 6 (ref 5.0–8.0)

## 2017-12-31 LAB — TSH: TSH: 0.86 u[IU]/mL (ref 0.35–4.50)

## 2017-12-31 NOTE — Progress Notes (Signed)
Danielle Cox is a 57 y.o. female here for an acute visit.  History of Present Illness:   Danielle Cox, CMA acting as scribe for Dr. [name].   Headache   This is a chronic problem. The current episode started more than 1 month ago. The problem occurs daily. The problem has been waxing and waning. The pain is located in the bilateral region. The pain radiates to the right neck and left neck. The pain quality is not similar to prior headaches. The quality of the pain is described as aching, pulsating, squeezing and stabbing. The pain is at a severity of 6/10. Associated symptoms include abdominal pain, back pain, a loss of balance, nausea, neck pain, phonophobia, photophobia, vomiting and weakness. Pertinent negatives include no blurred vision, coughing, dizziness, ear pain, eye pain, fever, rhinorrhea, sinus pressure, sore throat, swollen glands or tinnitus. The symptoms are aggravated by emotional stress. She has tried darkened room, NSAIDs and acetaminophen for the symptoms. The treatment provided no relief. Her past medical history is significant for hypertension and obesity.   History of severe depression with suicide attempt, fibromyalgia. Hx of ECT. Smoker.    Patient also in office for evaluation on urinary frequency starting two weeks ago. She states that she is getting up five or more times a night to urinate. She also is urinating more frequently during the day. She denies any pain with urination, fever, or vomiting. She is having chills, nausea, and lower abdominal pain, also associated with bilateral lower back pain radiating to groin. The pain is throbbing and increasing with standing. Hx of chronic back pain, followed by SM and PMR. BMs normal, last one yesterday. She has increased water intake and has been drinking cranberry juice.  Review of Systems  Constitutional: Positive for malaise/fatigue. Negative for chills and fever.  HENT: Negative for ear pain, rhinorrhea,  sinus pressure, sore throat and tinnitus.   Eyes: Positive for photophobia. Negative for blurred vision and pain.  Respiratory: Negative for cough.   Cardiovascular: Negative for chest pain.  Gastrointestinal: Positive for abdominal pain, nausea and vomiting. Negative for blood in stool, heartburn and melena.  Genitourinary: Positive for flank pain, frequency and urgency. Negative for dysuria and hematuria.  Musculoskeletal: Positive for back pain, myalgias and neck pain.  Neurological: Positive for weakness, headaches and loss of balance. Negative for dizziness.  Endo/Heme/Allergies: Negative for polydipsia.  Psychiatric/Behavioral: Positive for depression.   PMHx, SurgHx, SocialHx, Medications, and Allergies were reviewed in the Visit Navigator and updated as appropriate.  Current Medications:   .  ARIPiprazole (ABILIFY) 5 MG tablet, Take 5 mg by mouth daily., Disp: , Rfl:  .  budesonide-formoterol (SYMBICORT) 160-4.5 MCG/ACT inhaler, Inhale 2 puffs into the lungs 2 (two) times daily., Disp: 1 Inhaler, Rfl: 3 .  carbamazepine (TEGRETOL XR) 200 MG 12 hr tablet, Take 3 tablets (600 mg total) by mouth 2 (two) times daily. For mood stability, Disp: 180 tablet, Rfl: 0 .  diclofenac sodium (VOLTAREN) 1 % GEL, Apply topically to affected area qid, Disp: 100 g, Rfl: 1 .  fluticasone (FLONASE) 50 MCG/ACT nasal spray, INSTILL 2 SPRAYS INTO EACH NOSTRIL DAILY, Disp: 16 g, Rfl: 4 .  gabapentin (NEURONTIN) 300 MG capsule, Take 1 capsule (300 mg total) by mouth 3 (three) times daily., Disp: 90 capsule, Rfl: 1 .  PARoxetine (PAXIL) 20 MG tablet, , Disp: , Rfl:  .  traZODone (DESYREL) 150 MG tablet, 3 (three) times daily. , Disp: , Rfl:  Allergies  Allergen Reactions  . Moxifloxacin Hcl Palpitations    REACTION: tackycardia  . Penicillins Rash    Has patient had a PCN reaction causing immediate rash, facial/tongue/throat swelling, SOB or lightheadedness with hypotension: Yes Has patient had a PCN  reaction causing severe rash involving mucus membranes or skin necrosis: No Has patient had a PCN reaction that required hospitalization: No Has patient had a PCN reaction occurring within the last 10 years: No If all of the above answers are "NO", then may proceed with Cephalosporin use.   . Corticosteroids Other (See Comments)    Mania   Review of Systems:   Pertinent items are noted in the HPI. Otherwise, ROS is negative.  Vitals:   Vitals:   12/31/17 1307  BP: 130/76  Pulse: 98  Temp: 98.6 F (37 C)  TempSrc: Oral  SpO2: 96%  Weight: 181 lb 6.4 oz (82.3 kg)  Height: 5\' 5"  (1.651 m)     Body mass index is 30.19 kg/m.  Physical Exam:   Physical Exam Vitals signs and nursing note reviewed.  HENT:     Head: Normocephalic and atraumatic.  Eyes:     Pupils: Pupils are equal, round, and reactive to light.  Neck:     Musculoskeletal: Normal range of motion and neck supple.  Cardiovascular:     Rate and Rhythm: Normal rate and regular rhythm.     Heart sounds: Normal heart sounds.  Pulmonary:     Effort: Pulmonary effort is normal.  Abdominal:     General: Abdomen is protuberant.     Palpations: Abdomen is soft.     Tenderness: There is abdominal tenderness in the right lower quadrant, suprapubic area and left lower quadrant. There is no right CVA tenderness, left CVA tenderness, guarding or rebound.  Musculoskeletal:     Lumbar back: She exhibits decreased range of motion and spasm.       Back:  Skin:    General: Skin is warm.  Neurological:     General: No focal deficit present.     Mental Status: She is alert and oriented to person, place, and time.     Cranial Nerves: Cranial nerves are intact.     Sensory: Sensation is intact.     Motor: Motor function is intact.     Coordination: Coordination is intact.     Gait: Gait is intact.  Psychiatric:        Attention and Perception: Attention normal.        Mood and Affect: Mood is depressed. Affect is blunt.         Speech: Speech normal.        Behavior: Behavior is cooperative.        Thought Content: Thought content normal.        Cognition and Memory: Cognition normal.      Results for orders placed or performed in visit on 12/31/17  CBC with Differential/Platelet  Result Value Ref Range   WBC 7.9 4.0 - 10.5 K/uL   RBC 4.84 3.87 - 5.11 Mil/uL   Hemoglobin 16.3 (H) 12.0 - 15.0 g/dL   HCT 16.1 (H) 09.6 - 04.5 %   MCV 97.2 78.0 - 100.0 fl   MCHC 34.6 30.0 - 36.0 g/dL   RDW 40.9 81.1 - 91.4 %   Platelets 286.0 150.0 - 400.0 K/uL   Neutrophils Relative % 69.2 43.0 - 77.0 %   Lymphocytes Relative 20.7 12.0 - 46.0 %   Monocytes Relative 7.2  3.0 - 12.0 %   Eosinophils Relative 2.1 0.0 - 5.0 %   Basophils Relative 0.8 0.0 - 3.0 %   Neutro Abs 5.5 1.4 - 7.7 K/uL   Lymphs Abs 1.6 0.7 - 4.0 K/uL   Monocytes Absolute 0.6 0.1 - 1.0 K/uL   Eosinophils Absolute 0.2 0.0 - 0.7 K/uL   Basophils Absolute 0.1 0.0 - 0.1 K/uL  Comprehensive metabolic panel  Result Value Ref Range   Sodium 137 135 - 145 mEq/L   Potassium 3.9 3.5 - 5.1 mEq/L   Chloride 99 96 - 112 mEq/L   CO2 31 19 - 32 mEq/L   Glucose, Bld 116 (H) 70 - 99 mg/dL   BUN 13 6 - 23 mg/dL   Creatinine, Ser 1.610.67 0.40 - 1.20 mg/dL   Total Bilirubin 0.2 0.2 - 1.2 mg/dL   Alkaline Phosphatase 70 39 - 117 U/L   AST 13 0 - 37 U/L   ALT 27 0 - 35 U/L   Total Protein 6.3 6.0 - 8.3 g/dL   Albumin 4.1 3.5 - 5.2 g/dL   Calcium 9.8 8.4 - 09.610.5 mg/dL   GFR 04.5496.93 >09.81>60.00 mL/min  Urinalysis, Routine w reflex microscopic  Result Value Ref Range   Color, Urine YELLOW Yellow;Lt. Yellow   APPearance CLEAR Clear   Specific Gravity, Urine 1.015 1.000 - 1.030   pH 6.0 5.0 - 8.0   Total Protein, Urine NEGATIVE Negative   Urine Glucose NEGATIVE Negative   Ketones, ur NEGATIVE Negative   Bilirubin Urine NEGATIVE Negative   Hgb urine dipstick NEGATIVE Negative   Urobilinogen, UA 0.2 0.0 - 1.0   Leukocytes, UA NEGATIVE Negative   Nitrite NEGATIVE  Negative   WBC, UA 0-2/hpf 0-2/hpf   RBC / HPF none seen 0-2/hpf   Squamous Epithelial / LPF Rare(0-4/hpf) Rare(0-4/hpf)  TSH  Result Value Ref Range   TSH 0.86 0.35 - 4.50 uIU/mL  POCT Urinalysis Dipstick (Automated)  Result Value Ref Range   Color, UA yellow    Clarity, UA clear    Glucose, UA Negative Negative   Bilirubin, UA Negative    Ketones, UA Negative    Spec Grav, UA 1.020 1.010 - 1.025   Blood, UA Negative    pH, UA 6.0 5.0 - 8.0   Protein, UA Negative Negative   Urobilinogen, UA 0.2 0.2 or 1.0 E.U./dL   Nitrite, UA Negative    Leukocytes, UA Negative Negative   Assessment and Plan:   Danielle Cox with multiple chronic and acute complaints today. UA looked clear. Back pain c/w Hx of the same, but she is tender during abdominal exam. Will order CT abd/pelvis to further evaluate. Chronic daily headache, likely complicated by neck issues, however, in setting of smoker, Hx ECT, worsening issues - will add MRI to see if she needs to be evaluated by Neurology more quickly.  Diagnoses and all orders for this visit:  Pelvic pain -     CBC with Differential/Platelet -     Comprehensive metabolic panel -     Urinalysis, Routine w reflex microscopic -     Urine Culture -     US Pelvis Complete; Future  Urinary frequency -     POCT Urinalysis Dipstick (Automated)  Current every day smoker  Chronic intractable headache, unspecified headache type -     TSH -     MR BRAIN WO CONTRAST; Future  Fibromyalgia  Cervical radiculopathy  Lumbar back pain   . Reviewed expectations re: course of  current medical issues. . Discussed self-management of symptoms. . Outlined signs and symptoms indicating need for more acute intervention. . Patient verbalized understanding and all questions were answered. Marland Kitchen Health Maintenance issues including appropriate healthy diet, exercise, and smoking avoidance were discussed with patient. . See orders for this visit as documented in the  electronic medical record. . Patient received an After Visit Summary.  CMA served as Neurosurgeon during this visit. History, Physical, and Plan performed by medical provider. The above documentation has been reviewed and is accurate and complete. [name], D.O.  [name], DO [place] [place] 01/01/2018  Records requested if needed. Time spent with the patient: 45 minutes, of which >50% was spent in obtaining information about her symptoms, reviewing her previous labs, evaluations, and treatments, counseling her about her condition (please see the discussed topics above), and developing a plan to further investigate it; she had a number of questions which I addressed.

## 2018-01-01 ENCOUNTER — Encounter: Payer: Self-pay | Admitting: Family Medicine

## 2018-01-01 LAB — URINE CULTURE
MICRO NUMBER:: 91508793
SPECIMEN QUALITY:: ADEQUATE

## 2018-01-02 ENCOUNTER — Ambulatory Visit
Admission: RE | Admit: 2018-01-02 | Discharge: 2018-01-02 | Disposition: A | Discharge status: Home / Self Care | Payer: Medicare Other | Source: Ambulatory Visit | Attending: Family Medicine | Admitting: Family Medicine

## 2018-01-02 DIAGNOSIS — R51 Headache: Principal | ICD-10-CM

## 2018-01-02 DIAGNOSIS — R519 Headache, unspecified: Secondary | ICD-10-CM

## 2018-01-02 DIAGNOSIS — G8929 Other chronic pain: Secondary | ICD-10-CM

## 2018-01-09 ENCOUNTER — Ambulatory Visit
Admission: RE | Admit: 2018-01-09 | Discharge: 2018-01-09 | Disposition: A | Discharge status: Home / Self Care | Payer: Medicare Other | Source: Ambulatory Visit | Attending: Family Medicine | Admitting: Family Medicine

## 2018-01-09 DIAGNOSIS — Z1231 Encounter for screening mammogram for malignant neoplasm of breast: Secondary | ICD-10-CM

## 2018-01-13 ENCOUNTER — Telehealth: Payer: Self-pay | Admitting: Family Medicine

## 2018-01-13 NOTE — Telephone Encounter (Signed)
Already addressed, right? See result note for image.

## 2018-01-13 NOTE — Telephone Encounter (Signed)
Please advise 

## 2018-01-13 NOTE — Telephone Encounter (Signed)
Copied from CRM [id number]. Topic: Quick Communication - See Telephone Encounter >> Jan 13, 2018 10:53 AM Angela NevinWilliams, Candice N wrote: CRM for notification. See Telephone encounter for: 01/13/18.  Patient called to inquire if Dr. Earlene PlaterWallace has reviewed her imaging results from 12/16. Please advise.

## 2018-01-13 NOTE — Telephone Encounter (Signed)
Called patient she was wanting her mammogram results. I have given them to her she will call if any problems.

## 2018-01-13 NOTE — Telephone Encounter (Signed)
See note

## 2018-02-03 ENCOUNTER — Ambulatory Visit: Payer: Self-pay

## 2018-02-03 NOTE — Telephone Encounter (Signed)
FYI has app with you tomorrow at 11:20

## 2018-02-03 NOTE — Telephone Encounter (Signed)
See note

## 2018-02-03 NOTE — Telephone Encounter (Signed)
Pt. Reports having "severe dizziness - I think it's related to my sinus symptoms." States when she gets a sinus infection, she gets dizziness. The pain is mainly on the right side of her face.Sinus' feel "blocked." Denies fever. Denies vertigo.Appointment made for tomorrow. Reason for Disposition . [1] MODERATE dizziness (e.g., interferes with normal activities) AND [2] has NOT been evaluated by physician for this  (Exception: dizziness caused by heat exposure, sudden standing, or poor fluid intake)  Answer Assessment - Initial Assessment Questions 1. DESCRIPTION: "Describe your dizziness."     Dizzy 2. LIGHTHEADED: "Do you feel lightheaded?" (e.g., somewhat faint, woozy, weak upon standing)     Feels faint 3. VERTIGO: "Do you feel like either you or the room is spinning or tilting?" (i.e. vertigo)     No 4. SEVERITY: "How bad is it?"  "Do you feel like you are going to faint?" "Can you stand and walk?"   - MILD - walking normally   - MODERATE - interferes with normal activities (e.g., work, school)    - SEVERE - unable to stand, requires support to walk, feels like passing out now.      Mild 5. ONSET:  "When did the dizziness begin?"     Started few weeks , but has become worse 6. AGGRAVATING FACTORS: "Does anything make it worse?" (e.g., standing, change in head position)     Changing positions 7. HEART RATE: "Can you tell me your heart rate?" "How many beats in 15 seconds?"  (Note: not all patients can do this)       No 8. CAUSE: "What do you think is causing the dizziness?"     Sinus 9. RECURRENT SYMPTOM: "Have you had dizziness before?" If so, ask: "When was the last time?" "What happened that time?"     Yes - sinus 10. OTHER SYMPTOMS: "Do you have any other symptoms?" (e.g., fever, chest pain, vomiting, diarrhea, bleeding)       No 11. PREGNANCY: "Is there any chance you are pregnant?" "When was your last menstrual period?"       No  Protocols used: DIZZINESS [facility]

## 2018-02-04 ENCOUNTER — Telehealth: Payer: Self-pay

## 2018-02-04 ENCOUNTER — Encounter: Payer: Self-pay | Admitting: Family Medicine

## 2018-02-04 ENCOUNTER — Ambulatory Visit: Payer: Medicare Other | Admitting: Family Medicine

## 2018-02-04 VITALS — BP 122/84 | HR 94 | Temp 98.4°F | Ht 65.0 in | Wt 177.8 lb

## 2018-02-04 DIAGNOSIS — F17200 Nicotine dependence, unspecified, uncomplicated: Secondary | ICD-10-CM

## 2018-02-04 DIAGNOSIS — J329 Chronic sinusitis, unspecified: Secondary | ICD-10-CM

## 2018-02-04 DIAGNOSIS — F314 Bipolar disorder, current episode depressed, severe, without psychotic features: Secondary | ICD-10-CM | POA: Diagnosis not present

## 2018-02-04 DIAGNOSIS — B9689 Other specified bacterial agents as the cause of diseases classified elsewhere: Secondary | ICD-10-CM | POA: Diagnosis not present

## 2018-02-04 MED ORDER — DOXYCYCLINE HYCLATE 100 MG PO TABS: 100.0000 mg | ORAL_TABLET | Freq: Two times a day (BID) | ORAL | 0 refills | Status: DC

## 2018-02-04 MED ORDER — POTASSIUM CHLORIDE CRYS ER 20 MEQ PO TBCR: EXTENDED_RELEASE_TABLET | ORAL | 1 refills | Status: DC

## 2018-02-04 MED ORDER — FUROSEMIDE 20 MG PO TABS: ORAL_TABLET | ORAL | 1 refills | Status: DC

## 2018-02-04 NOTE — Addendum Note (Signed)
Addended by: Dierdre Searles on: 02/04/2018 02:06 PM   Modules accepted: Orders

## 2018-02-04 NOTE — Telephone Encounter (Signed)
Pt c/o increased urination while taking Triamterene-HCTZ. At OV today she wanted to find out if it would be OK to stop this medication. I advised that I would check with Dr. Earlene Plater and give her a call as she had already left for lunch.   Called pt and advised. She agrees to d/c Triamterene-HCTZ and try an alternative medication. She would like a 30 day supply sent to [facility].   I have d/c Triamterene-HCTZ on med list. Forwarding to Dr. Earlene Plater regarding alternative medication.

## 2018-02-04 NOTE — Progress Notes (Signed)
Danielle Cox is a 58 y.o. female here for an acute visit.  History of Present Illness:   I Danielle Cox/CMA acting as scribe for Dr. [name].   HPI:  Pt c/o sinus pain and pressure. Sx have been present x several months. She c/o nasal congestion and "rattling" on the R side of her head. She has COPD and is a smoker. She is unable to produce mucus when attempting to blow her nose. She denies fever but has noticed body aches. She does have fibromyalgia and attributes body aches to that.   Pt c/o diarrhea 5 x yesterday and 8 x today. He says that stool is green. Sx seem to have progressed over the past week. She notes minimal changes to her diet, she has been trying to cut out junk food. She c/o abd pain and nausea but denies vomiting. She has been trying to eat saltine crackers and bananas in addition to drinking more water since sx started.   She c/o LBP which she also relates to fibromyalgia. She denies dysuria, hematuria. Last urine culture was normal.   PMHx, SurgHx, SocialHx, Medications, and Allergies were reviewed in the Visit Navigator and updated as appropriate.  Current Medications:   .  ARIPiprazole (ABILIFY) 5 MG tablet, Take 5 mg by mouth daily., Disp: , Rfl:  .  budesonide-formoterol (SYMBICORT) 160-4.5 MCG/ACT inhaler, Inhale 2 puffs into the lungs 2 (two) times daily., Disp: 1 Inhaler, Rfl: 3 .  carbamazepine (TEGRETOL XR) 200 MG 12 hr tablet, Take 3 tablets (600 mg total) by mouth 2 (two) times daily. For mood stability, Disp: 180 tablet, Rfl: 0 .  diclofenac sodium (VOLTAREN) 1 % GEL, Apply topically to affected area qid, Disp: 100 g, Rfl: 1 .  fluticasone (FLONASE) 50 MCG/ACT nasal spray, INSTILL 2 SPRAYS INTO EACH NOSTRIL DAILY, Disp: 16 g, Rfl: 4 .  gabapentin (NEURONTIN) 300 MG capsule, Take 1 capsule (300 mg total) by mouth 3 (three) times daily., Disp: 90 capsule, Rfl: 1 .  PARoxetine (PAXIL) 20 MG tablet, , Disp: , Rfl:  .  traZODone (DESYREL) 150 MG  tablet, 3 (three) times daily. , Disp: , Rfl:    Allergies  Allergen Reactions  . Moxifloxacin Hcl Palpitations    REACTION: tackycardia  . Penicillins Rash    Has patient had a PCN reaction causing immediate rash, facial/tongue/throat swelling, SOB or lightheadedness with hypotension: Yes Has patient had a PCN reaction causing severe rash involving mucus membranes or skin necrosis: No Has patient had a PCN reaction that required hospitalization: No Has patient had a PCN reaction occurring within the last 10 years: No If all of the above answers are "NO", then may proceed with Cephalosporin use.   . Corticosteroids Other (See Comments)    Mania   Review of Systems:   Pertinent items are noted in the HPI. Otherwise, ROS is negative.  Vitals:   Vitals:   02/04/18 1200  BP: 122/84  Pulse: 94  Temp: 98.4 F (36.9 C)  TempSrc: Oral  SpO2: 96%  Weight: 177 lb 12.8 oz (80.6 kg)  Height: 5\' 5"  (1.651 m)     Body mass index is 29.59 kg/m.  Physical Exam:   Physical Exam Vitals signs and nursing note reviewed.  HENT:     Head: Normocephalic and atraumatic.     Nose:     Right Sinus: Maxillary sinus tenderness present.     Left Sinus: Maxillary sinus tenderness present.  Eyes:  Pupils: Pupils are equal, round, and reactive to light.  Neck:     Musculoskeletal: Normal range of motion and neck supple.  Cardiovascular:     Rate and Rhythm: Normal rate and regular rhythm.     Heart sounds: Normal heart sounds.  Pulmonary:     Effort: Pulmonary effort is normal.  Abdominal:     Palpations: Abdomen is soft.  Skin:    General: Skin is warm.  Psychiatric:        Behavior: Behavior normal.    Assessment and Plan:   Aleia was seen today for sinus problem.  Diagnoses and all orders for this visit:  Bacterial sinusitis -     doxycycline (VIBRA-TABS) 100 MG tablet; Take 1 tablet (100 mg total) by mouth 2 (two) times daily.  Current every day smoker  Bipolar 1  disorder, depressed, severe (HCC)   . Reviewed expectations re: course of current medical issues. . Discussed self-management of symptoms. . Outlined signs and symptoms indicating need for more acute intervention. . Patient verbalized understanding and all questions were answered. Marland Kitchen Health Maintenance issues including appropriate healthy diet, exercise, and smoking avoidance were discussed with patient. . See orders for this visit as documented in the electronic medical record. . Patient received an After Visit Summary.  CMA served as Neurosurgeon during this visit. History, Physical, and Plan performed by medical provider. The above documentation has been reviewed and is accurate and complete. [name], D.O.  [name], DO [place], [facility] 02/04/2018

## 2018-02-04 NOTE — Telephone Encounter (Signed)
Okay Lasix 20 in am, with KCL 20 meq. #30. 1 refill. If issues, let me know.

## 2018-02-04 NOTE — Telephone Encounter (Signed)
Rx sent to pharmacy, pt aware. 

## 2018-02-06 ENCOUNTER — Telehealth: Payer: Self-pay | Admitting: Family Medicine

## 2018-02-06 NOTE — Telephone Encounter (Signed)
Okay to send in low-dose Atnivert. 25 mg po q 8 hours prn vertigo. #20. If no improvement, let us and/or Psych know. Some of these symptoms may be withdrawal from Psych medications.

## 2018-02-06 NOTE — Telephone Encounter (Signed)
See note  Copied from CRM [id number]. Topic: General - Other >> Feb 06, 2018 11:59 AM [name] wrote: Reason for CRM:   Patient was in to see her provider on Tues 02/04/2018 for sinus problems, but she wanted the provider to know she is still experiencing dizziness even with taking meds for it.  Please advise.

## 2018-02-06 NOTE — Telephone Encounter (Signed)
Called patient she is having increased dizziness more when she is up moving around. Was not bad yesterday but more so today. She has tried otc dramamine with no improvement. She is drinking and eating ok. She is still having some lose bowel movements but that is much better. She has been taking the fluid pill and potasium. She has not had any falls. When dizzy she feels like everything is going from side to side. Increased when she closes her eyes.   Patient informed you are out of the office today she is ok with waiting until tomorrow for answer.

## 2018-02-07 ENCOUNTER — Ambulatory Visit: Payer: Self-pay

## 2018-02-07 ENCOUNTER — Other Ambulatory Visit: Payer: Self-pay

## 2018-02-07 MED ORDER — MECLIZINE HCL 25 MG PO TABS: 25.0000 mg | ORAL_TABLET | Freq: Three times a day (TID) | ORAL | 0 refills | Status: DC | PRN

## 2018-02-07 NOTE — Telephone Encounter (Signed)
See note

## 2018-02-07 NOTE — Telephone Encounter (Signed)
Called patient let her know that she should stay on medications until we hear back from Dr. Earlene Plater.

## 2018-02-07 NOTE — Telephone Encounter (Signed)
Pt. Reports she started Lasix on Wednesday. Reports it has made her "very sleepy during the day, and read it in the side effects that it can do that." Please advise pt.  Answer Assessment - Initial Assessment Questions 1. SYMPTOMS: "Do you have any symptoms?"     Sleepy during the day 2. SEVERITY: If symptoms are present, ask "Are they mild, moderate or severe?"     Moderate  Protocols used: MEDICATION QUESTION CALL-A-AH

## 2018-02-07 NOTE — Telephone Encounter (Signed)
Called patient reviewed all information. She will call if any change.

## 2018-02-09 NOTE — Telephone Encounter (Signed)
Does not usually make sleepy. Can she come by [facility] to get lab draw? CMP, BNP.

## 2018-02-10 ENCOUNTER — Ambulatory Visit: Payer: Self-pay | Admitting: *Deleted

## 2018-02-10 NOTE — Telephone Encounter (Signed)
See note

## 2018-02-10 NOTE — Telephone Encounter (Signed)
Spoke to patient has app tomorrow.

## 2018-02-10 NOTE — Telephone Encounter (Signed)
Contacted pt regarding symptoms; she states that she has constant cramping and pressure in her bladder; she says that she has been urinating a lot since she has been on a "water pill"; the says that this has been ongoing since she started the "water pills"; it has progressively worsened; she says that lower back pain for several months which she attributes to fibromyalgia; the pt also says that she had 5 soft stools; recommendations made per nurse triage protocol; pt offered and accepted appointment with Dr [name], [facility], 02/11/2018 at 1000; she verbalized understanding; will route to office for notification of this upcoming appointment.      Reason for Disposition . Age > 50 years  Answer Assessment - Initial Assessment Questions 1. SEVERITY: "How bad is the pain?"  (e.g., Scale 1-10; mild, moderate, or severe)   - MILD (1-3): complains slightly about urination hurting   - MODERATE (4-7): interferes with normal activities     - SEVERE (8-10): excruciating, unwilling or unable to urinate because of the pain      Pain rated 7 out of 10 2. FREQUENCY: "How many times have you had painful urination today?"      6 times today, and 6 times last night (10-12 times since 1900 on 02/09/18) 3. PATTERN: "Is pain present every time you urinate or just sometimes?"      constant 4. ONSET: "When did the painful urination start?"      weeks 5. FEVER: "Do you have a fever?" If so, ask: "What is your temperature, how was it measured, and when did it start?"     no 6. PAST UTI: "Have you had a urine infection before?" If so, ask: "When was the last time?" and "What happened that time?"      Yes; a few months ago 7. CAUSE: "What do you think is causing the painful urination?"  (e.g., UTI, scratch, Herpes sore)     Not sure 8. OTHER SYMPTOMS: "Do you have any other symptoms?" (e.g., flank pain, vaginal discharge, genital sores, urgency, blood in urine)     Bladder pressure 9. PREGNANCY: "Is  there any chance you are pregnant?" "When was your last menstrual period?"     no  Protocols used: URINATION PAIN - [facility]

## 2018-02-10 NOTE — Telephone Encounter (Signed)
Called patient she has app with you in am for other issues will get labs then

## 2018-02-10 NOTE — Progress Notes (Signed)
Danielle MartinezSheila K Cox is a 58 y.o. female here for an acute visit.  History of Present Illness:   HPI: Lasix/K started at last visit. Patient felt bladder pressure, frequency, and bilateral leg cramping. Stopped yesterday and feels better. Denies LE edema. Cardiovascular ROS: no chest pain or dyspnea on exertion.  Due for labs. See orders.   PMHx, SurgHx, SocialHx, Medications, and Allergies were reviewed in the Visit Navigator and updated as appropriate.  Current Medications   .  ARIPiprazole (ABILIFY) 5 MG tablet, Take 5.5 mg by mouth daily. , Disp: , Rfl:  .  budesonide-formoterol (SYMBICORT) 160-4.5 MCG/ACT inhaler, Inhale 2 puffs into the lungs 2 (two) times daily., Disp: 1 Inhaler, Rfl: 3 .  clorazepate (TRANXENE) 7.5 MG tablet, , Disp: , Rfl:  .  doxycycline (VIBRA-TABS) 100 MG tablet, Take 1 tablet (100 mg total) by mouth 2 (two) times daily., Disp: 20 tablet, Rfl: 0 .  fluticasone (FLONASE) 50 MCG/ACT nasal spray, INSTILL 2 SPRAYS INTO EACH NOSTRIL DAILY, Disp: 16 g, Rfl: 4 .  furosemide (LASIX) 20 MG tablet, Take 1 tablet, po, qAM along with KCL 20 meq, Disp: 30 tablet, Rfl: 1 .  gabapentin (NEURONTIN) 600 MG tablet, , Disp: , Rfl:  .  meclizine (ANTIVERT) 25 MG tablet, Take 1 tablet (25 mg total) by mouth every 8 (eight) hours as needed for dizziness., Disp: 20 tablet, Rfl: 0 .  PARoxetine (PAXIL) 20 MG tablet, 40 mg. , Disp: , Rfl:  .  potassium chloride SA (K-DUR,KLOR-CON) 20 MEQ tablet, Take 1 tablet, po, qAM along with Furosemide., Disp: 30 tablet, Rfl: 1 .  traZODone (DESYREL) 150 MG tablet, 3 (three) times daily. , Disp: , Rfl:    Allergies  Allergen Reactions  . Moxifloxacin Hcl Palpitations    REACTION: tackycardia  . Penicillins Rash    Has patient had a PCN reaction causing immediate rash, facial/tongue/throat swelling, SOB or lightheadedness with hypotension: Yes Has patient had a PCN reaction causing severe rash involving mucus membranes or skin necrosis: No Has  patient had a PCN reaction that required hospitalization: No Has patient had a PCN reaction occurring within the last 10 years: No If all of the above answers are "NO", then may proceed with Cephalosporin use.   . Corticosteroids Other (See Comments)    Mania   Review of Systems   Pertinent items are noted in the HPI. Otherwise, ROS is negative.  Vitals   Vitals:   02/11/18 1006  BP: 124/86  Pulse: 84  Temp: 98.5 F (36.9 C)  TempSrc: Oral  SpO2: 95%  Weight: 181 lb (82.1 kg)  Height: 5\' 5"  (1.651 m)     Body mass index is 30.12 kg/m.  Physical Exam   Physical Exam Vitals signs and nursing note reviewed.  HENT:     Head: Normocephalic and atraumatic.  Eyes:     Pupils: Pupils are equal, round, and reactive to light.  Neck:     Musculoskeletal: Normal range of motion and neck supple.  Cardiovascular:     Rate and Rhythm: Normal rate and regular rhythm.     Heart sounds: Normal heart sounds.  Pulmonary:     Effort: Pulmonary effort is normal.  Abdominal:     Palpations: Abdomen is soft.  Skin:    General: Skin is warm.  Psychiatric:        Behavior: Behavior normal.    Assessment and Plan   Danielle HatchetSheila was seen today for bladder pressure and abdominal cramping.  Diagnoses and all orders for this visit:  Lower abdominal pain -     POCT Urinalysis Dipstick -     Urine Culture -     DG Abd 2 Views; Future  Encounter for screening for HIV  Encounter for hepatitis C screening test for low risk patient  Obesity (BMI 30.0-34.9)  Fibromyalgia  Current every day smoker  Essential hypertension  Bilateral lower extremity edema -     CBC with Differential/Platelet -     Comprehensive metabolic panel -     Brain natriuretic peptide -     Magnesium  Screening for HIV (human immunodeficiency virus) -     HIV Antibody (routine testing w rflx)  Encounter for hepatitis C virus screening test for high risk patient -     Hepatitis C antibody  Screening for  lipid disorders -     Lipid panel -     LDL cholesterol, direct    . Reviewed expectations re: course of current medical issues. . Discussed self-management of symptoms. . Outlined signs and symptoms indicating need for more acute intervention. . Patient verbalized understanding and all questions were answered. Marland Kitchen. Health Maintenance issues including appropriate healthy diet, exercise, and smoking avoidance were discussed with patient. . See orders for this visit as documented in the electronic medical record. . Patient received an After Visit Summary.  Helane RimaErica Izabelle Daus, DO [place] 02/12/2018

## 2018-02-11 ENCOUNTER — Ambulatory Visit (INDEPENDENT_AMBULATORY_CARE_PROVIDER_SITE_OTHER): Payer: Medicare Other

## 2018-02-11 ENCOUNTER — Encounter: Payer: Self-pay | Admitting: Family Medicine

## 2018-02-11 ENCOUNTER — Ambulatory Visit: Payer: Medicare Other | Admitting: Family Medicine

## 2018-02-11 VITALS — BP 124/86 | HR 84 | Temp 98.5°F | Ht 65.0 in | Wt 181.0 lb

## 2018-02-11 DIAGNOSIS — R1030 Lower abdominal pain, unspecified: Secondary | ICD-10-CM | POA: Diagnosis not present

## 2018-02-11 DIAGNOSIS — Z13220 Encounter for screening for lipoid disorders: Secondary | ICD-10-CM | POA: Diagnosis not present

## 2018-02-11 DIAGNOSIS — Z114 Encounter for screening for human immunodeficiency virus [HIV]: Secondary | ICD-10-CM

## 2018-02-11 DIAGNOSIS — R3989 Other symptoms and signs involving the genitourinary system: Secondary | ICD-10-CM

## 2018-02-11 DIAGNOSIS — R600 Localized edema: Secondary | ICD-10-CM

## 2018-02-11 DIAGNOSIS — E669 Obesity, unspecified: Secondary | ICD-10-CM

## 2018-02-11 DIAGNOSIS — I10 Essential (primary) hypertension: Secondary | ICD-10-CM

## 2018-02-11 DIAGNOSIS — Z9189 Other specified personal risk factors, not elsewhere classified: Secondary | ICD-10-CM

## 2018-02-11 DIAGNOSIS — Z1159 Encounter for screening for other viral diseases: Secondary | ICD-10-CM

## 2018-02-11 DIAGNOSIS — M797 Fibromyalgia: Secondary | ICD-10-CM

## 2018-02-11 DIAGNOSIS — F17200 Nicotine dependence, unspecified, uncomplicated: Secondary | ICD-10-CM

## 2018-02-11 LAB — COMPREHENSIVE METABOLIC PANEL
ALT: 19 U/L (ref 0–35)
AST: 11 U/L (ref 0–37)
Albumin: 4 g/dL (ref 3.5–5.2)
Alkaline Phosphatase: 75 U/L (ref 39–117)
BUN: 13 mg/dL (ref 6–23)
CO2: 28 mEq/L (ref 19–32)
Calcium: 9.8 mg/dL (ref 8.4–10.5)
Chloride: 106 mEq/L (ref 96–112)
Creatinine, Ser: 0.66 mg/dL (ref 0.40–1.20)
GFR: 92.75 mL/min (ref >60.00)
Glucose, Bld: 94 mg/dL (ref 70–99)
Potassium: 4.4 mEq/L (ref 3.5–5.1)
Sodium: 141 mEq/L (ref 135–145)
Total Bilirubin: 0.2 mg/dL (ref 0.2–1.2)
Total Protein: 6.2 g/dL (ref 6.0–8.3)

## 2018-02-11 LAB — POCT URINALYSIS DIPSTICK
Bilirubin, UA: NEGATIVE
Blood, UA: NEGATIVE
Glucose, UA: NEGATIVE
Ketones, UA: NEGATIVE
Leukocytes, UA: NEGATIVE
Nitrite, UA: NEGATIVE
Protein, UA: NEGATIVE
Spec Grav, UA: >=1.03 — AB (ref 1.010–1.025)
Urobilinogen, UA: 0.2 E.U./dL
pH, UA: 6 (ref 5.0–8.0)

## 2018-02-11 LAB — CBC WITH DIFFERENTIAL/PLATELET
Basophils Absolute: 0.1 10*3/uL (ref 0.0–0.1)
Basophils Relative: 0.7 % (ref 0.0–3.0)
Eosinophils Absolute: 0.2 10*3/uL (ref 0.0–0.7)
Eosinophils Relative: 2.3 % (ref 0.0–5.0)
HCT: 47 % — ABNORMAL HIGH (ref 36.0–46.0)
Hemoglobin: 15.8 g/dL — ABNORMAL HIGH (ref 12.0–15.0)
Lymphocytes Relative: 24.2 % (ref 12.0–46.0)
Lymphs Abs: 2.2 10*3/uL (ref 0.7–4.0)
MCHC: 33.7 g/dL (ref 30.0–36.0)
MCV: 97.6 fl (ref 78.0–100.0)
Monocytes Absolute: 0.6 10*3/uL (ref 0.1–1.0)
Monocytes Relative: 6.7 % (ref 3.0–12.0)
Neutro Abs: 5.9 10*3/uL (ref 1.4–7.7)
Neutrophils Relative %: 66.1 % (ref 43.0–77.0)
Platelets: 294 10*3/uL (ref 150.0–400.0)
RBC: 4.82 Mil/uL (ref 3.87–5.11)
RDW: 12.8 % (ref 11.5–15.5)
WBC: 9 10*3/uL (ref 4.0–10.5)

## 2018-02-11 LAB — LDL CHOLESTEROL, DIRECT: Direct LDL: 173 mg/dL

## 2018-02-11 LAB — LIPID PANEL
Cholesterol: 276 mg/dL — ABNORMAL HIGH (ref 0–200)
HDL: 39.7 mg/dL (ref >39.00)
NonHDL: 236.14
Total CHOL/HDL Ratio: 7
Triglycerides: 254 mg/dL — ABNORMAL HIGH (ref 0.0–149.0)
VLDL: 50.8 mg/dL — ABNORMAL HIGH (ref 0.0–40.0)

## 2018-02-11 LAB — MAGNESIUM: Magnesium: 2 mg/dL (ref 1.5–2.5)

## 2018-02-11 LAB — BRAIN NATRIURETIC PEPTIDE: Pro B Natriuretic peptide (BNP): 33 pg/mL (ref 0.0–100.0)

## 2018-02-12 DIAGNOSIS — E669 Obesity, unspecified: Secondary | ICD-10-CM | POA: Insufficient documentation

## 2018-02-12 LAB — HEPATITIS C ANTIBODY
Hepatitis C Ab: NONREACTIVE
SIGNAL TO CUT-OFF: 0.07 (ref <1.00)

## 2018-02-12 LAB — URINE CULTURE
MICRO NUMBER:: 115788
Result:: NO GROWTH
SPECIMEN QUALITY:: ADEQUATE

## 2018-02-12 LAB — HIV ANTIBODY (ROUTINE TESTING W REFLEX): HIV 1&2 Ab, 4th Generation: NONREACTIVE

## 2018-02-13 MED ORDER — ATORVASTATIN CALCIUM 20 MG PO TABS: 20.0000 mg | ORAL_TABLET | Freq: Every day | ORAL | 3 refills | Status: DC

## 2018-02-13 NOTE — Progress Notes (Unsigned)
Statin sent

## 2018-02-18 ENCOUNTER — Encounter: Payer: Self-pay | Admitting: Neurology

## 2018-02-18 NOTE — Progress Notes (Signed)
NEUROLOGY CONSULT OFFICE NOTE  Danielle Cox [id number]  Referring provider:  Gaspar Bidding, DO PCP:  [name], DO  HISTORY OF PRESENT ILLNESS: Danielle Cox is a 58 year old right-handed Caucasian woman with fibromyalgia, Bipolar disorder, depression, HTN and COPD who presents for headache.  She is accompanied by her husband who supplements history.  Onset:  Since her 40s.  Progressively gotten worse over the years. Location:  Usually right sided front to back Quality:  throbbing Intensity:  7-8/10.  She denies new headache, thunderclap headache  Aura:  No Prodrome:  Hungry or anorexia Postdrome:  fatigue Associated symptoms:  Nausea, sometimes blurred vision.  She denies associated vomiting, photophobia, phonophobia, unilateral numbness or weakness. Duration:  All day Frequency:  daily Frequency of abortive medication: Has taken OTC analgesics ASA, Tylenol, Aleve daily for many years Triggers:  Unknown Relieving factors:  no Activity:  aggravates  MRI of brain from 01/02/18 was personally reviewed and was normal. MRI of cervical spine from 10/20/17 was personally reviewed and demonstrated disc degeneration from C3-4 to C6-7 with herniation at C5-6 and C6-7 causing mild flattening of the ventral cord.  Current NSAIDS:  ASA, Advil, Aleve Current analgesics:  Tylenol Current triptans:  none Current ergotamine:  none Current anti-emetic:  none Current muscle relaxants:  none Current anti-anxiolytic:  Tranxene Current sleep aide:  trazodone Current Antihypertensive medications:  none Current Antidepressant medications:  Paxil 40mg   Current Anticonvulsant medications:  Gabapentin 600mg  TID Current anti-CGRP:  none Current Vitamins/Herbal/Supplements:  D3, B12, iron Current Antihistamines/Decongestants:  none Other therapy:  none Other medication:  Abilify  Past NSAIDS:  None Past analgesics:  Fioricet, Excedrin Past abortive triptans:  Sumatriptan 50mg  Past  abortive ergotamine:  none Past muscle relaxants:  tizanidine Past anti-emetic:  none Past antihypertensive medications:  no Past antidepressant medications:  Remeron, Pristiq Past anticonvulsant medications:  Tegretol, Depakote Past anti-CGRP:  none Past vitamins/Herbal/Supplements:  none Past antihistamines/decongestants:  none Other past therapies:  none  Caffeine:  2 cups of tea daily.  No coffee or Colas Smoker:  yes Diet:  Water, Sprite, Gatorade Exercise:  Housework and yard work Depression:  yes; Anxiety:  yes Other pain:  Neck pain, back pain Sleep hygiene:  varies Family history of headache:  no  CMP from 1/28 was normal.  PAST MEDICAL HISTORY: Past Medical History:  Diagnosis Date  . Anxiety   . Bipolar disorder (HCC)   . Bronchitis, chronic (HCC)   . COPD (chronic obstructive pulmonary disease) (HCC)   . Depression   . Personality disorder [facility])    Past Surgical History:  Procedure Laterality Date  . CARPAL TUNNEL RELEASE Right   . CESAREAN SECTION  1983, 1988, 1992  . TONSILLECTOMY  age 7    MEDICATIONS: Current Outpatient Medications on File Prior to Visit  Medication Sig Dispense Refill  . ARIPiprazole (ABILIFY) 5 MG tablet Take 5.5 mg by mouth daily.     Marland Kitchen atorvastatin (LIPITOR) 20 MG tablet Take 1 tablet (20 mg total) by mouth daily. 90 tablet 3  . budesonide-formoterol (SYMBICORT) 160-4.5 MCG/ACT inhaler Inhale 2 puffs into the lungs 2 (two) times daily. 1 Inhaler 3  . clorazepate (TRANXENE) 7.5 MG tablet     . doxycycline (VIBRA-TABS) 100 MG tablet Take 1 tablet (100 mg total) by mouth 2 (two) times daily. 20 tablet 0  . fluticasone (FLONASE) 50 MCG/ACT nasal spray INSTILL 2 SPRAYS INTO EACH NOSTRIL DAILY 16 g 4  . furosemide (LASIX) 20 MG tablet  Take 1 tablet, po, qAM along with KCL 20 meq 30 tablet 1  . gabapentin (NEURONTIN) 600 MG tablet     . meclizine (ANTIVERT) 25 MG tablet Take 1 tablet (25 mg total) by mouth every 8 (eight) hours as  needed for dizziness. 20 tablet 0  . PARoxetine (PAXIL) 20 MG tablet 40 mg.     . potassium chloride SA (K-DUR,KLOR-CON) 20 MEQ tablet Take 1 tablet, po, qAM along with Furosemide. 30 tablet 1  . traZODone (DESYREL) 150 MG tablet 3 (three) times daily.      No current facility-administered medications on file prior to visit.     ALLERGIES: Allergies  Allergen Reactions  . Moxifloxacin Hcl Palpitations    REACTION: tackycardia  . Penicillins Rash    Has patient had a PCN reaction causing immediate rash, facial/tongue/throat swelling, SOB or lightheadedness with hypotension: Yes Has patient had a PCN reaction causing severe rash involving mucus membranes or skin necrosis: No Has patient had a PCN reaction that required hospitalization: No Has patient had a PCN reaction occurring within the last 10 years: No If all of the above answers are "NO", then may proceed with Cephalosporin use.   . Corticosteroids Other (See Comments)    Mania    FAMILY HISTORY: Family History  Problem Relation Age of Onset  . Depression Mother   . Alcohol abuse Mother   . Depression Father   . Alcohol abuse Father   . Alcohol abuse Sister   . Depression Sister   . Alcohol abuse Brother   . Depression Brother   . Breast cancer Neg Hx    SOCIAL HISTORY: Social History   Socioeconomic History  . Marital status: Married    Spouse name: Not on file  . Number of children: Not on file  . Years of education: Not on file  . Highest education level: Not on file  Occupational History  . Not on file  Social Needs  . Financial resource strain: Not very hard  . Food insecurity:    Worry: Never true    Inability: Never true  . Transportation needs:    Medical: No    Non-medical: No  Tobacco Use  . Smoking status: Current Every Day Smoker    Packs/day: 1.00    Years: 40.00    Pack years: 40.00    Types: Cigarettes  . Smokeless tobacco: Never Used  Substance and Sexual Activity  . Alcohol use: No    . Drug use: No  . Sexual activity: Not Currently    Partners: Male    Birth control/protection: None  Lifestyle  . Physical activity:    Days per week: 0 days    Minutes per session: Not on file  . Stress: Very much  Relationships  . Social connections:    Talks on phone: Never    Gets together: Once a week    Attends religious service: Never    Active member of club or organization: No    Attends meetings of clubs or organizations: Never    Relationship status: Married  . Intimate partner violence:    Fear of current or ex partner: No    Emotionally abused: No    Physically abused: No    Forced sexual activity: No  Other Topics Concern  . Not on file  Social History Narrative   10/29/2012 [name] [name] was born and grew up in [place], [place]. She has 2 brothers and one sister. She reports that her childhood  was "poor," and explains that she needs both financially and emotionally. She completed the 10th grade, then achieved her GED. She has been married twice. The first time at age 89, and that ended after 1 year. She is currently married to her second husband of 27 years. She has 2 sons and one daughter. She is currently unemployed and on disability for 2 years. She lives with her husband, her daughter, and one of her daughter's friends. She denies any legal difficulties. She reports that she is spiritual but not religious. Her hobbies include reading and cross stitch. She reports that she has no social support network. 10/29/2012 [name]    REVIEW OF SYSTEMS: Constitutional: No fevers, chills, or sweats, no generalized fatigue, change in appetite Eyes: No visual changes, double vision, eye pain Ear, nose and throat: No hearing loss, ear pain, nasal congestion, sore throat Cardiovascular: No chest pain, palpitations Respiratory:  No shortness of breath at rest or with exertion, wheezes GastrointestinaI: No nausea, vomiting, diarrhea, abdominal pain, fecal incontinence Genitourinary:  No  dysuria, urinary retention or frequency Musculoskeletal:  neck pain, back pain Integumentary: No rash, pruritus, skin lesions Neurological: as above Psychiatric: depression, insomnia, anxiety Endocrine: No palpitations, fatigue, diaphoresis, mood swings, change in appetite, change in weight, increased thirst Hematologic/Lymphatic:  No purpura, petechiae. Allergic/Immunologic: no itchy/runny eyes, nasal congestion, recent allergic reactions, rashes  PHYSICAL EXAM: Blood pressure 128/82, pulse 98, height 5\' 5"  (1.651 m), weight 180 lb (81.6 kg), SpO2 92 %. General: No acute distress.  Patient appears well-groomed.   Head:  Normocephalic/atraumatic Eyes:  Fundi examined but not visualized Neck: supple, paraspinal tenderness, full range of motion Heart:  Regular rate and rhythm Lungs:  Clear to auscultation bilaterally Back: paraspinal tenderness Neurological Exam: alert and oriented to person, place, and time. Attention span and concentration intact, recent and remote memory intact, fund of knowledge intact.  Speech fluent and not dysarthric, language intact.  CN II-XII intact. Bulk and tone normal, muscle strength 5/5 throughout.  Sensation to light touch, temperature and vibration intact.  Deep tendon reflexes 2+ throughout, toes downgoing.  Finger to nose and heel to shin testing intact.  Gait normal, Romberg negative.  IMPRESSION: Chronic migraine without aura, without status migrainosus, not intractable, complicated by medication overuse, depression and chronic pain  PLAN: 1.  She defers preventative medication at this time.  She would like to discuss possible options with her psychiatrist first, which I agree.  She would like to avoid the anti-CGRP.  I suggested propranolol, zonisamide or nortriptyline 2.  For abortive therapy, Relpax 40mg .  She has already tried sumatriptan and has a contraindication to Maxalt 3.  She will taper off of over the counter analgesics by one day per week  over the next several weeks.  Will only use Relpax as abortive therapy.  Limit use of pain relievers to no more than 2 days out of week to prevent risk of rebound or medication-overuse headache. 4.  Keep headache diary 5.  Exercise, hydration, caffeine cessation, sleep hygiene, monitor for and avoid triggers 6. She would like to try supplements/vitamins:  magnesium citrate 400mg  daily, riboflavin 400mg  daily, and coenzyme Q10 100mg  three times daily 7.  Follow up in 4 months.   Shon MilletAdam Khalea Ventura, DO  CC:  Helane RimaErica Wallace, DO  Gaspar BiddingMichael Rigby, DO

## 2018-02-21 ENCOUNTER — Encounter

## 2018-02-21 ENCOUNTER — Other Ambulatory Visit: Payer: Self-pay | Admitting: Sports Medicine

## 2018-02-21 ENCOUNTER — Ambulatory Visit: Payer: Medicare Other | Admitting: Neurology

## 2018-02-21 ENCOUNTER — Encounter: Payer: Self-pay | Admitting: Neurology

## 2018-02-21 VITALS — BP 128/82 | HR 98 | Ht 65.0 in | Wt 180.0 lb

## 2018-02-21 DIAGNOSIS — G4440 Drug-induced headache, not elsewhere classified, not intractable: Secondary | ICD-10-CM | POA: Diagnosis not present

## 2018-02-21 DIAGNOSIS — G43709 Chronic migraine without aura, not intractable, without status migrainosus: Secondary | ICD-10-CM | POA: Diagnosis not present

## 2018-02-21 DIAGNOSIS — M797 Fibromyalgia: Secondary | ICD-10-CM

## 2018-02-21 DIAGNOSIS — F3130 Bipolar disorder, current episode depressed, mild or moderate severity, unspecified: Secondary | ICD-10-CM | POA: Diagnosis not present

## 2018-02-21 MED ORDER — ELETRIPTAN HYDROBROMIDE 40 MG PO TABS: ORAL_TABLET | ORAL | 3 refills | Status: DC

## 2018-02-21 NOTE — Patient Instructions (Signed)
Migraine Recommendations: 1.  Consider following preventative medications:  Propranolol, zonisamide, nortriptyline 2.  Take eletriptan 40mg  at earliest onset of headache.  May repeat dose once in 2 hours if needed.  Do not exceed two tablets in 24 hours. 3.  Limit use of pain relievers to no more than 2 days out of week to prevent risk of rebound or medication-overuse headache.  STOP ALL OVER THE COUNTER ANALGESICS (ASPIRIN, TYLENOL, ALEVE, ADVIL, ETC).  You may taper off slowly but one day each week (treat no more than 6 days on week one, then no more than 5 days on week two, then no more than 4 days on week three, ... until you are off all over the counter analgesics)  4.  Be aware of common food triggers such as processed sweets, processed foods with nitrites (such as deli meat, hot dogs, sausages), foods with MSG, alcohol (such as wine), chocolate, certain cheeses, certain fruits (dried fruits, bananas, pineapple), vinegar, diet soda. 4.  Avoid caffeine 5.  Routine exercise 6.  Proper sleep hygiene 7.  Stay adequately hydrated with water 8.  Keep a headache diary. 9.  Maintain proper stress management. 10.  Do not skip meals. 11.  Consider supplements:  Magnesium citrate 400mg  to 600mg  daily, riboflavin 400mg , Coenzyme Q 10 100mg  three times daily  12.  Follow up in 4 months

## 2018-02-24 NOTE — Telephone Encounter (Signed)
Per last OV note with Berline Chough on 12/17/17 Gabapentin 300 mg TID. Looks like pt reported med change 02/04/18 to 600 mg TID. Forwarding to Dr. Berline Chough to advise.

## 2018-02-25 NOTE — Progress Notes (Signed)
Rcvd approval from [facility]- [id number] through 01/15/19

## 2018-03-24 ENCOUNTER — Telehealth: Payer: Self-pay | Admitting: Family Medicine

## 2018-03-24 NOTE — Telephone Encounter (Signed)
Pt returned Brandy's vm. No answer on direct line. Please reach back out to pt.

## 2018-03-24 NOTE — Telephone Encounter (Signed)
See note

## 2018-03-24 NOTE — Telephone Encounter (Signed)
Copied from CRM [id number]. Topic: Quick Communication - See Telephone Encounter >> Mar 24, 2018 12:47 PM Arlyss Gandy, NT wrote: CRM for notification. See Telephone encounter for: 03/24/18.Pt states that she is continuing to have headaches, hip pain, back pain, and all over pain. She is unsure if this is all related to the fibromyalgia. She states she has been doing some walking, but that the pain is there when she stands or sits. Requesting to discuss with Dr. Janeece Riggers nurse of what she can do.

## 2018-03-24 NOTE — Telephone Encounter (Signed)
Called pt and left VM to call the office.  

## 2018-03-25 NOTE — Telephone Encounter (Signed)
Spoke with pt, she has been more active recently and has noticed increased pain and swelling in her lower extremities. She has noticed increased pain in her lower back and hips. She said Dr. Berline Chough has mentions MRI of her L-spine or hips but she doesn't want to do that d/t cost. She was seen by neurology and did not feel that this was beneficial because they told her to stop taking Tylenol and Excedrin and sent in a new rx that was $50 and she didn't get it d/t cost. She continues to take Tylenol and Excedrin. I advised, per last OV note, that Dr. Berline Chough recommended referral to neurosurgery if she had lack of improvement, pt declined referral. She is requesting a muscle relaxer that will not make her sleepy. I advised her that she will likely need to be seen in the office for evaluation has last visit was in 12/2017. She advised the she is currently in [place] and will be there for the next few weeks but still wanted me to check with Dr. Berline Chough regarding having a muscle relaxer sent in.   Forwarding to Dr. Berline Chough.

## 2018-03-26 ENCOUNTER — Ambulatory Visit: Payer: Self-pay | Admitting: Family Medicine

## 2018-03-26 NOTE — Telephone Encounter (Signed)
I returned pt's call.   She is in [place] but gets frequent sinus infections.   She said Dr. Earlene Plater will usually call in something for her.  See triage notes for symptoms.  She is going to call back and give [place] the name and information of the pharmacy there to send the Rx to.  I sent these notes to Dr. Earlene Plater for disposition.    Reason for Disposition . [1] Sinus congestion (pressure, fullness) AND [2] present > 10 days    Had symptoms for a week.  Not greater than 10 days as mentioned in the protocol.  Answer Assessment - Initial Assessment Questions 1. LOCATION: "Where does it hurt?"      Headache my whole head. 2. ONSET: "When did the sinus pain start?"  (e.g., hours, days)      AA week.  It has progressed.   I can't blow much out.   It's yellow mucus. 3. SEVERITY: "How bad is the pain?"   (Scale 1-10; mild, moderate or severe)   - MILD (1-3): doesn't interfere with normal activities    - MODERATE (4-7): interferes with normal activities (e.g., work or school) or awakens from sleep   - SEVERE (8-10): excruciating pain and patient unable to do any normal activities        Every time bend over I get dizzy.    4. RECURRENT SYMPTOM: "Have you ever had sinus problems before?" If so, ask: "When was the last time?" and "What happened that time?"      Yes 5. NASAL CONGESTION: "Is the nose blocked?" If so, ask, "Can you open it or must you breathe through the mouth?"     Can't blow nose 6. NASAL DISCHARGE: "Do you have discharge from your nose?" If so ask, "What color?"     No 7. FEVER: "Do you have a fever?" If so, ask: "What is it, how was it measured, and when did it start?"      No fever 8. OTHER SYMPTOMS: "Do you have any other symptoms?" (e.g., sore throat, cough, earache, difficulty breathing)     Sore throat, ears with pressure, yellow mucus with coughing.   9. PREGNANCY: "Is there any chance you are pregnant?" "When was your last menstrual period?"     Not  asked  Protocols used: SINUS PAIN OR CONGESTION-A-AH

## 2018-03-26 NOTE — Telephone Encounter (Signed)
See note

## 2018-03-26 NOTE — Telephone Encounter (Signed)
Please advise 

## 2018-03-26 NOTE — Telephone Encounter (Signed)
Message from Margarito Courser sent at 03/26/2018 4:05 PM EDT   [facility] in [place], [place] and their number is [phone] its located on [address].

## 2018-03-27 NOTE — Telephone Encounter (Signed)
Thrush more likely from Symbicort. Wash mouth and brush teeth after.

## 2018-03-27 NOTE — Telephone Encounter (Signed)
Okay Rx omnicef 200 BID x 10 days. Confirm no allergy to cephalosporin class. Okay if rash to PCN.

## 2018-03-27 NOTE — Telephone Encounter (Signed)
Called patient she does not think that she has no allergy to Cephalosporin. I have called meds into pharmacy as requested.  She had an allergic reaction to steroid injection that she had. She wants to know if the Flonase could be causing reaction that keeps giving her thrush. Told her to hold until we call back with answer. She was informed that you are out of the office today.

## 2018-03-28 ENCOUNTER — Other Ambulatory Visit: Payer: Self-pay | Admitting: *Deleted

## 2018-03-28 ENCOUNTER — Other Ambulatory Visit: Payer: Self-pay

## 2018-03-28 MED ORDER — NYSTATIN 100000 UNIT/ML MT SUSP: 5.0000 mL | Freq: Four times a day (QID) | OROMUCOSAL | 0 refills | Status: DC

## 2018-03-28 NOTE — Telephone Encounter (Signed)
Okay to hold Symbicort. Okay to call in Nystatin swish and spit.

## 2018-03-28 NOTE — Telephone Encounter (Signed)
See note

## 2018-03-28 NOTE — Telephone Encounter (Signed)
Pt returned call to office and message given to pt per notes of Dr. Earlene Plater on 03/28/18. Pt verbalized understanding. Pt requesting for prescription for Nystatin be sent to [place], [place]. Prescription resent to requested pharmacy.

## 2018-03-28 NOTE — Telephone Encounter (Signed)
Called and left detailed voicemail message on patient's cell phone advising per Dr. Earlene Plater to hold Symbicort and that we sent in rx for Nystatin to her pharmacy.  Advised to c/b w/questions or concerns.

## 2018-03-28 NOTE — Telephone Encounter (Signed)
Called and spoke with patient.  She states that she stopped taking her Symbicort 5 days ago and feels better being off of it.  She also reports that she had been washing her mouth out and brushing her teeth after each use.    Ok for her to d/c Symbicort or does she need to resume?   Please advise.

## 2018-03-28 NOTE — Telephone Encounter (Signed)
Unfortunately there are not any muscle relaxers that will not also make her sleepy.  She needs an OV to discuss any medication changes.  If she is unable to return to care due to being out of the state for a prolonged period it would be appropriate to be evaluated in [place].

## 2018-03-28 NOTE — Telephone Encounter (Signed)
Left message to return call to our office.  

## 2018-04-01 ENCOUNTER — Ambulatory Visit: Payer: Self-pay | Admitting: *Deleted

## 2018-04-01 ENCOUNTER — Other Ambulatory Visit: Payer: Self-pay | Admitting: Family Medicine

## 2018-04-01 NOTE — Telephone Encounter (Signed)
Please advise 

## 2018-04-01 NOTE — Telephone Encounter (Signed)
Okay small dose of prednisone. 5 mg, #21.

## 2018-04-01 NOTE — Telephone Encounter (Signed)
See note

## 2018-04-01 NOTE — Telephone Encounter (Signed)
Called patient she is allergic to steroids do you still want me to call in meds?

## 2018-04-01 NOTE — Telephone Encounter (Signed)
Called patient reviewed all information and repeated back to me. Will call if any questions.  ? ?

## 2018-04-01 NOTE — Telephone Encounter (Signed)
Pt reports has been taking Omnicef 200mg  BID as prescribed by Dr. Earlene Plater for 'sinus infection.' On day 5/10. Reports sinus tenderness,pressure remains along with headache 10/10 at times. States afebrile. Has taken Aleve or tylenol which helps with headache, "But comes back." Reports "Really stuffy." States cannot use Flonase or Symbicort or any spray with steroids as she is "Allergic to steroids."  States has been using saline nasal spray which is ineffective. Requesting something "To loosen up this stuffiness."   Please advise: [phone]  Reason for Disposition . Caller requesting a NON-URGENT new prescription or refill and triager unable to refill per unit policy  Answer Assessment - Initial Assessment Questions 1. SYMPTOMS: "Do you have any symptoms?"     yes 2. SEVERITY: If symptoms are present, ask "Are they mild, moderate or severe?"     Moderate "Stuffy" Sinus pressure, pain. Day 5/10 on antibiotic  Protocols used: MEDICATION QUESTION CALL-A-AH

## 2018-04-01 NOTE — Telephone Encounter (Signed)
Spoke with pt and advised per Dr. Berline Chough. She says that she has been feeling a little better and she has been more active recently. She will call back to schedule OV if needed.

## 2018-04-01 NOTE — Telephone Encounter (Signed)
Start OTC Mucinex to break up mucous and drink lots of water.

## 2018-04-08 ENCOUNTER — Telehealth: Payer: Self-pay

## 2018-04-08 ENCOUNTER — Other Ambulatory Visit: Payer: Self-pay | Admitting: Family Medicine

## 2018-04-08 NOTE — Telephone Encounter (Signed)
Received call from patient wanted to know if anything she can do to help if she get Covid? She has history of COPD not able to take inhalers with steroids.

## 2018-04-09 NOTE — Telephone Encounter (Signed)
Same precautions:  Coronavirus (COVID-19) Are you at risk?  Are you at risk for the Coronavirus (COVID-19)?  To be considered HIGH RISK for Coronavirus (COVID-19), you have to meet the following criteria:  . Traveled to [place], [place], [place], [place] or [place]; or in the [place] to [place], [place], [place], or [place]; and have fever, cough, and shortness of breath within the last 2 weeks of travel OR . Been in close contact with a person diagnosed with COVID-19 within the last 2 weeks and have fever, cough, and shortness of breath . IF YOU DO NOT MEET THESE CRITERIA, YOU ARE CONSIDERED LOW RISK FOR COVID-19.  What to do if you are HIGH RISK for COVID-19?  Marland Kitchen If you are having a medical emergency, call 911. . Seek medical care right away. Before you go to a doctor's office, urgent care or emergency department, call ahead and tell them about your recent travel, contact with someone diagnosed with COVID-19, and your symptoms. You should receive instructions from your physician's office regarding next steps of care.  . When you arrive at healthcare provider, tell the healthcare staff immediately you have returned from visiting [place], [place], [place], [place] or [place]; or traveled in the [place] to [place], [place], [place], or [place]; in the last two weeks or you have been in close contact with a person diagnosed with COVID-19 in the last 2 weeks.   . Tell the health care staff about your symptoms: fever, cough and shortness of breath. . After you have been seen by a medical provider, you will be either: o Tested for (COVID-19) and discharged home on quarantine except to seek medical care if symptoms worsen, and asked to  - Stay home and avoid contact with others until you get your results (4-5 days)  - Avoid travel on public transportation if possible (such as bus, train, or airplane) or o Sent to the Emergency Department by EMS for evaluation, COVID-19 testing,  and possible admission depending on your condition and test results.  What to do if you are LOW RISK for COVID-19?  Reduce your risk of any infection by using the same precautions used for avoiding the common cold or flu:  Marland Kitchen Wash your hands often with soap and warm water for at least 20 seconds.  If soap and water are not readily available, use an alcohol-based hand sanitizer with at least 60% alcohol.  . If coughing or sneezing, cover your mouth and nose by coughing or sneezing into the elbow areas of your shirt or coat, into a tissue or into your sleeve (not your hands). . Avoid shaking hands with others and consider head nods or verbal greetings only. . Avoid touching your eyes, nose, or mouth with unwashed hands.  . Avoid close contact with people who are sick. . Avoid places or events with large numbers of people in one location, like concerts or sporting events. . Carefully consider travel plans you have or are making. . If you are planning any travel outside or inside the [place], visit the CDC's Travelers' Health webpage for the latest health notices. . If you have some symptoms but not all symptoms, continue to monitor at home and seek medical attention if your symptoms worsen. . If you are having a medical emergency, call 911.   ADDITIONAL HEALTHCARE OPTIONS FOR PATIENTS  [facility] / e-Visit: [email or web address]         [facility]: [phone]  [facility]: [phone]                   [facility]: [phone]

## 2018-04-09 NOTE — Telephone Encounter (Signed)
Spoke to pt, concerned because she has COPD. Went over [employer] Dr. Earlene Plater noted. Pt verbalized understanding and said is a little congested and is taking Mucinex. Told pt to continue to monitor symptoms and if worsen please let us know. Pt verbalized understanding.

## 2018-04-17 ENCOUNTER — Telehealth: Payer: Self-pay | Admitting: Family Medicine

## 2018-04-17 ENCOUNTER — Other Ambulatory Visit: Payer: Self-pay

## 2018-04-17 NOTE — Telephone Encounter (Signed)
Patient left message asking if Dr. Earlene Plater had approved her for getting an inhaler because what she was previously on she was allergic too

## 2018-04-17 NOTE — Telephone Encounter (Signed)
Called patient she wanted to know if she could have another inhaler called in. She is allergic to Symbicort gives her thrush and swelling in mouth.

## 2018-04-17 NOTE — Telephone Encounter (Signed)
WEBEX to discuss.

## 2018-04-17 NOTE — Telephone Encounter (Signed)
See note

## 2018-04-18 ENCOUNTER — Other Ambulatory Visit: Payer: Self-pay | Admitting: Family Medicine

## 2018-04-20 NOTE — Progress Notes (Signed)
Virtual Visit via Video   I connected with Danielle Cox on 04/21/18 at  9:00 AM EDT by a video enabled telemedicine application and verified that I am speaking with the correct person using two identifiers. Location patient: Home Location provider: [place], Office Persons participating in the virtual visit: Danielle Cox, Danielle Rima, DO Danielle Cox, CMA acting as scribe for Dr. Helane Cox.   I discussed the limitations of evaluation and management by telemedicine and the availability of in person appointments. The patient expressed understanding and agreed to proceed.  Subjective:   HPI: Patient has had increased allergy issues. She was not able to take Flonase and Symbicort  due to thrush. She has been taking a lot of musinex, and other over th counter medications. She has found a musinex  spray that has been helping. Directions say not to use more than twice a day but she has been using 4 times a day. We reviewed problems with taking medication more than you should. She should only take for 3 days in a row. She was informed that it will give rebound symptoms. She stopped Flonase after she had reaction to steroid injection. She has had some shortness of breath. She has been working more in the yard and that has helped with weight loss. She was instructed to use mask if outside to help with allergy.        Reviewed all precautions and expectations with prevention of Covid-19  ROS: See pertinent positives and negatives per HPI.  Patient Active Problem List   Diagnosis Date Noted  . Obesity (BMI 30.0-34.9) 02/12/2018  . Cervical radiculopathy 12/29/2017  . Lumbar back pain 12/29/2017  . Fibromyalgia 11/21/2017  . COPD (chronic obstructive pulmonary disease) (HCC) 11/21/2017  . Affective psychosis, bipolar (HCC)   . Major depressive disorder, recurrent severe without psychotic features (HCC) 12/13/2016  . Bipolar 1 disorder, depressed, severe (HCC) 11/20/2016   Class: Chronic  . Overweight (BMI 25.0-29.9) 03/02/2016  . Personality disorder (HCC) 04/23/2013  . Current every day smoker 02/17/2009  . Essential hypertension 02/17/2009  . Cough 02/17/2009    Social History   Tobacco Use  . Smoking status: Current Every Day Smoker    Packs/day: 1.00    Years: 40.00    Pack years: 40.00    Types: Cigarettes  . Smokeless tobacco: Never Used  Substance Use Topics  . Alcohol use: No   Current Outpatient Medications:  .  atorvastatin (LIPITOR) 20 MG tablet, Take 1 tablet (20 mg total) by mouth daily., Disp: 90 tablet, Rfl: 3 .  Cholecalciferol (D3 MAXIMUM STRENGTH PO), Take 2,500 mg by mouth daily., Disp: , Rfl:  .  clorazepate (TRANXENE) 7.5 MG tablet, , Disp: , Rfl:  .  Cyanocobalamin (B-12) 2500 MCG TABS, Take 2,500 mg by mouth daily., Disp: , Rfl:  .  eletriptan (RELPAX) 40 MG tablet, Take 1 tablet earliest onset of migraine.  May repeat x1 in 2 hours if headache persists or recurs.  Max 2 tablets/24, Disp: 10 tablet, Rfl: 3 .  gabapentin (NEURONTIN) 600 MG tablet, 600 mg 3 (three) times daily. , Disp: , Rfl:  .  IRON, FERROUS SULFATE, PO, Take 65 mg by mouth daily., Disp: , Rfl:  .  PARoxetine (PAXIL) 20 MG tablet, 40 mg. , Disp: , Rfl:  .  potassium chloride SA (K-DUR,KLOR-CON) 20 MEQ tablet, TAKE ONE TABLET BY MOUTH EVERY MORNING ALONG WITH FUROSEMIDE, Disp: 30 tablet, Rfl: 0 .  traZODone (DESYREL) 150  MG tablet, 3 (three) times daily. , Disp: , Rfl:  .  azelastine (ASTELIN) 0.1 % nasal spray, Place 2 sprays into both nostrils 2 (two) times daily. Use in each nostril as directed, Disp: 30 mL, Rfl: 12 .  cetirizine (ZYRTEC) 10 MG tablet, Take one tab a night., Disp: 30 tablet, Rfl: 11 .  furosemide (LASIX) 20 MG tablet, , Disp: , Rfl:  .  Ipratropium-Albuterol (COMBIVENT) 20-100 MCG/ACT AERS respimat, Inhale 1 puff into the lungs every 6 (six) hours., Disp: 1 Inhaler, Rfl: 3 .  modafinil (PROVIGIL) 100 MG tablet, , Disp: , Rfl:  .  PARoxetine  (PAXIL) 40 MG tablet, , Disp: , Rfl:   Allergies  Allergen Reactions  . Moxifloxacin Hcl Palpitations    REACTION: tackycardia  . Penicillins Rash    Has patient had a PCN reaction causing immediate rash, facial/tongue/throat swelling, SOB or lightheadedness with hypotension: Yes Has patient had a PCN reaction causing severe rash involving mucus membranes or skin necrosis: No Has patient had a PCN reaction that required hospitalization: No Has patient had a PCN reaction occurring within the last 10 years: No If all of the above answers are "NO", then may proceed with Cephalosporin use.   . Corticosteroids Other (See Comments)    Mania    Objective:   VITALS: Per patient if applicable, see vitals. GENERAL: Alert, appears well and in no acute distress. HEENT: Atraumatic, conjunctiva clear, no obvious abnormalities on inspection of external nose and ears. NECK: Normal movements of the head and neck. CARDIOPULMONARY: No increased WOB. Speaking in clear sentences. I:E ratio WNL.  MS: Moves all visible extremities without noticeable abnormality. PSYCH: Pleasant and cooperative, well-groomed. Speech normal rate and rhythm. Affect is appropriate. Insight and judgement are appropriate. Attention is focused, linear, and appropriate.  NEURO: CN grossly intact. Oriented as arrived to appointment on time with no prompting. Moves both UE equally.  SKIN: No obvious lesions, wounds, erythema, or cyanosis noted on face or hands.  Assessment and Plan:   Danielle Cox was seen today for follow-up.  Diagnoses and all orders for this visit:  Chronic obstructive pulmonary disease with acute exacerbation (HCC) -     Ipratropium-Albuterol (COMBIVENT) 20-100 MCG/ACT AERS respimat; Inhale 1 puff into the lungs every 6 (six) hours.  Current every day smoker  Seasonal allergic rhinitis due to pollen -     cetirizine (ZYRTEC) 10 MG tablet; Take one tab a night. -     azelastine (ASTELIN) 0.1 % nasal spray;  Place 2 sprays into both nostrils 2 (two) times daily. Use in each nostril as directed -     Ipratropium-Albuterol (COMBIVENT) 20-100 MCG/ACT AERS respimat; Inhale 1 puff into the lungs every 6 (six) hours.    . Reviewed expectations re: course of current medical issues. . Discussed self-management of symptoms. . Outlined signs and symptoms indicating need for more acute intervention. . Patient verbalized understanding and all questions were answered. Marland Kitchen Health Maintenance issues including appropriate healthy diet, exercise, and smoking avoidance were discussed with patient. . See orders for this visit as documented in the electronic medical record.  Danielle Rima, DO 04/21/2018

## 2018-04-21 ENCOUNTER — Encounter: Payer: Self-pay | Admitting: Family Medicine

## 2018-04-21 ENCOUNTER — Other Ambulatory Visit: Payer: Self-pay

## 2018-04-21 ENCOUNTER — Ambulatory Visit (INDEPENDENT_AMBULATORY_CARE_PROVIDER_SITE_OTHER): Payer: Medicare Other | Admitting: Family Medicine

## 2018-04-21 VITALS — Ht 65.0 in

## 2018-04-21 DIAGNOSIS — F17200 Nicotine dependence, unspecified, uncomplicated: Secondary | ICD-10-CM

## 2018-04-21 DIAGNOSIS — J441 Chronic obstructive pulmonary disease with (acute) exacerbation: Secondary | ICD-10-CM | POA: Diagnosis not present

## 2018-04-21 DIAGNOSIS — J301 Allergic rhinitis due to pollen: Secondary | ICD-10-CM

## 2018-04-21 MED ORDER — IPRATROPIUM-ALBUTEROL 20-100 MCG/ACT IN AERS: 1.0000 | INHALATION_SPRAY | Freq: Four times a day (QID) | RESPIRATORY_TRACT | 3 refills | Status: DC

## 2018-04-21 MED ORDER — AZELASTINE HCL 0.1 % NA SOLN: 2.0000 | Freq: Two times a day (BID) | NASAL | 12 refills | Status: DC

## 2018-04-21 MED ORDER — CETIRIZINE HCL 10 MG PO TABS: ORAL_TABLET | ORAL | 11 refills | Status: DC

## 2018-04-21 NOTE — Patient Instructions (Signed)
Websites that have reliable patient information: 1. American Academy of Asthma, Allergy, and Immunology: www.aaaai.org 2. Food Allergy Research and Education (FARE): foodallergy.org 3. Mothers of Asthmatics: http://www.asthmacommunitynetwork.org 4. American College of Allergy, Asthma, and Immunology: www.acaai.org

## 2018-04-22 ENCOUNTER — Other Ambulatory Visit: Payer: Self-pay | Admitting: Family Medicine

## 2018-04-29 ENCOUNTER — Other Ambulatory Visit: Payer: Self-pay | Admitting: Family Medicine

## 2018-05-04 ENCOUNTER — Other Ambulatory Visit: Payer: Self-pay | Admitting: Family Medicine

## 2018-05-05 ENCOUNTER — Other Ambulatory Visit: Payer: Self-pay

## 2018-05-05 ENCOUNTER — Other Ambulatory Visit: Payer: Self-pay | Admitting: Family Medicine

## 2018-05-05 MED ORDER — MECLIZINE HCL 25 MG PO TABS: 25.0000 mg | ORAL_TABLET | Freq: Three times a day (TID) | ORAL | 0 refills | Status: DC | PRN

## 2018-05-05 NOTE — Telephone Encounter (Signed)
Requested medication (s) are due for refill today -if to continue  Requested medication (s) are on the active medication list -yes  Future visit scheduled -no  Last refill: -03/02/18  Notes to clinic: Patient is requesting refill of historical medication- sent for PCP review of request  Requested Prescriptions  Pending Prescriptions Disp Refills   furosemide (LASIX) 20 MG tablet 30 tablet      Cardiovascular:  Diuretics - Loop Passed - 05/05/2018  8:02 AM      Passed - K in normal range and within 360 days    Potassium  Date Value Ref Range Status  02/11/2018 4.4 3.5 - 5.1 mEq/L Final         Passed - Ca in normal range and within 360 days    Calcium  Date Value Ref Range Status  02/11/2018 9.8 8.4 - 10.5 mg/dL Final         Passed - Na in normal range and within 360 days    Sodium  Date Value Ref Range Status  02/11/2018 141 135 - 145 mEq/L Final         Passed - Cr in normal range and within 360 days    Creatinine, Ser  Date Value Ref Range Status  02/11/2018 0.66 0.40 - 1.20 mg/dL Final         Passed - Last BP in normal range    BP Readings from Last 1 Encounters:  02/21/18 128/82         Passed - Valid encounter within last 6 months    Recent Outpatient Visits          2 weeks ago Chronic obstructive pulmonary disease with acute exacerbation (HCC)   [facility] [place], [place], DO   2 months ago Lower abdominal pain   [facility] [place], [place], DO   3 months ago Bacterial sinusitis   [facility] [place], [place], DO   4 months ago Pelvic pain   [facility] [place], [place], DO   4 months ago Fibromyalgia    [facility] [place], [place], DO              Requested Prescriptions  Pending Prescriptions Disp Refills   furosemide (LASIX) 20 MG tablet 30 tablet      Cardiovascular:  Diuretics - Loop Passed - 05/05/2018  8:02 AM     Passed - K in normal range and within 360 days    Potassium  Date Value Ref Range Status  02/11/2018 4.4 3.5 - 5.1 mEq/L Final         Passed - Ca in normal range and within 360 days    Calcium  Date Value Ref Range Status  02/11/2018 9.8 8.4 - 10.5 mg/dL Final         Passed - Na in normal range and within 360 days    Sodium  Date Value Ref Range Status  02/11/2018 141 135 - 145 mEq/L Final         Passed - Cr in normal range and within 360 days    Creatinine, Ser  Date Value Ref Range Status  02/11/2018 0.66 0.40 - 1.20 mg/dL Final         Passed - Last BP in normal range    BP Readings from Last 1 Encounters:  02/21/18 128/82         Passed - Valid encounter within last 6 months    Recent Outpatient  Visits          2 weeks ago Chronic obstructive pulmonary disease with acute exacerbation [facility](HCC)   [facility] [place], [place], [place]   2 months ago Lower abdominal pain   [facility] [place], [place], [place]   3 months ago Bacterial sinusitis   [facility] [place], [place], [place]   4 months ago Pelvic pain   [facility] [place], [place], [place]   4 months ago Fibromyalgia   [facility] [place], [place], DO

## 2018-05-05 NOTE — Telephone Encounter (Signed)
See note. Patient has had visit in last 6 months.

## 2018-05-05 NOTE — Telephone Encounter (Signed)
Spoke with patient, she requested a refill on Meclizine and it was denied.   Looks like change in therapy 01/2018.   Pt would like refill of Meclizine.   Forwarding to Dr. Earlene Plater.

## 2018-05-06 ENCOUNTER — Telehealth: Payer: Self-pay | Admitting: Family Medicine

## 2018-05-06 NOTE — Telephone Encounter (Signed)
Please call and schedule an appointment  Copied from CRM [id number]. Topic: General - Other >> May 06, 2018  3:06 PM Laural Benes, [place] C wrote: Reason for CRM: pt is calling in to be advised. Pt says that she is very congested and her sinuses is very dry. Pt says that she has been taking Mucinex and her nasal spray. Pt says that her mucus is clear. Pt doesn't have any other symptoms.   Pt would like to know if provider could send something in to pharmacy or schedule visit?    CB: [phone]

## 2018-05-07 ENCOUNTER — Other Ambulatory Visit: Payer: Self-pay

## 2018-05-07 ENCOUNTER — Ambulatory Visit (INDEPENDENT_AMBULATORY_CARE_PROVIDER_SITE_OTHER): Payer: Medicare Other | Admitting: Physician Assistant

## 2018-05-07 ENCOUNTER — Encounter: Payer: Self-pay | Admitting: Physician Assistant

## 2018-05-07 DIAGNOSIS — J3489 Other specified disorders of nose and nasal sinuses: Secondary | ICD-10-CM

## 2018-05-07 MED ORDER — DOXYCYCLINE HYCLATE 100 MG PO TABS: 100.0000 mg | ORAL_TABLET | Freq: Two times a day (BID) | ORAL | 0 refills | Status: DC

## 2018-05-07 NOTE — Telephone Encounter (Signed)
Danielle Cox, please schedule

## 2018-05-07 NOTE — Progress Notes (Signed)
Virtual Visit via Video   I connected with Danielle Cox on 05/07/18 at  9:20 AM EDT by a video enabled telemedicine application and verified that I am speaking with the correct person using two identifiers. Location patient: Home Location provider: [place] Cox, Office Persons participating in the virtual visit: Danielle Cox, Danielle Cox, [place]   I discussed the limitations of evaluation and management by telemedicine and the availability of in person appointments. The patient expressed understanding and agreed to proceed.  Subjective:   HPI: Nasal congestion Pt c/o nasal congestion x 5 days light green drainage,  facial pain around eyes and nose, headaches, bilateral ear discomfort and some dizziness. Pt denies fever, chills. Pt has hx of sinus infections. Pt has been taking Mucinex and Tylenol with little relief. She is a smoker. Has slight cough. Denies SOB. Is using her inhalers, combivent, as scheduled. Also using nasal astelin.  Denies known COVID contact.  ROS: See pertinent positives and negatives per HPI.  Patient Active Problem List   Diagnosis Date Noted  . Obesity (BMI 30.0-34.9) 02/12/2018  . Cervical radiculopathy 12/29/2017  . Lumbar back pain 12/29/2017  . Fibromyalgia 11/21/2017  . COPD (chronic obstructive pulmonary disease) (HCC) 11/21/2017  . Affective psychosis, bipolar (HCC)   . Major depressive disorder, recurrent severe without psychotic features (HCC) 12/13/2016  . Bipolar 1 disorder, depressed, severe (HCC) 11/20/2016    Class: Chronic  . Overweight (BMI 25.0-29.9) 03/02/2016  . Personality disorder (HCC) 04/23/2013  . Current every day smoker 02/17/2009  . Essential hypertension 02/17/2009  . Cough 02/17/2009    Social History   Tobacco Use  . Smoking status: Current Every Day Smoker    Packs/day: 1.00    Years: 40.00    Pack years: 40.00    Types: Cigarettes  . Smokeless tobacco: Never Used  Substance Use Topics  . Alcohol use: No     Current Outpatient Medications:  .  ARIPiprazole (ABILIFY) 10 MG tablet, Take 10 mg by mouth daily. , Disp: , Rfl:  .  atorvastatin (LIPITOR) 20 MG tablet, Take 1 tablet (20 mg total) by mouth daily., Disp: 90 tablet, Rfl: 3 .  azelastine (ASTELIN) 0.1 % nasal spray, Place 2 sprays into both nostrils 2 (two) times daily. Use in each nostril as directed, Disp: 30 mL, Rfl: 12 .  cetirizine (ZYRTEC) 10 MG tablet, Take one tab a night., Disp: 30 tablet, Rfl: 11 .  Cholecalciferol (D3 MAXIMUM STRENGTH PO), Take 2,500 mg by mouth daily., Disp: , Rfl:  .  clorazepate (TRANXENE) 7.5 MG tablet, Take 7.5 mg by mouth daily. , Disp: , Rfl:  .  Cyanocobalamin (B-12) 2500 MCG TABS, Take 2,500 mg by mouth daily., Disp: , Rfl:  .  furosemide (LASIX) 20 MG tablet, TAKE ONE TABLET BY MOUTH EVERY MORNING ALONG WITH POTASSIUM, Disp: 90 tablet, Rfl: 1 .  gabapentin (NEURONTIN) 600 MG tablet, 600 mg 3 (three) times daily. , Disp: , Rfl:  .  Ipratropium-Albuterol (COMBIVENT) 20-100 MCG/ACT AERS respimat, Inhale 1 puff into the lungs every 6 (six) hours., Disp: 1 Inhaler, Rfl: 3 .  IRON, FERROUS SULFATE, PO, Take 65 mg by mouth daily., Disp: , Rfl:  .  modafinil (PROVIGIL) 100 MG tablet, Take 100 mg by mouth 2 (two) times daily. , Disp: , Rfl:  .  PARoxetine (PAXIL) 20 MG tablet, Take 20 mg by mouth daily. , Disp: , Rfl:  .  PARoxetine (PAXIL) 40 MG tablet, Take 40 mg by mouth  daily. , Disp: , Rfl:  .  potassium chloride SA (K-DUR,KLOR-CON) 20 MEQ tablet, TAKE ONE TABLET BY MOUTH EVERY MORNING ALONG WITH FUROSEMIDE, Disp: 30 tablet, Rfl: 0 .  traZODone (DESYREL) 150 MG tablet, 3 (three) times daily. , Disp: , Rfl:  .  doxycycline (VIBRA-TABS) 100 MG tablet, Take 1 tablet (100 mg total) by mouth 2 (two) times daily., Disp: 20 tablet, Rfl: 0 .  meclizine (ANTIVERT) 25 MG tablet, Take 1 tablet (25 mg total) by mouth every 8 (eight) hours as needed for dizziness. (Patient not taking: Reported on 05/07/2018), Disp: 20  tablet, Rfl: 0  Allergies  Allergen Reactions  . Moxifloxacin Hcl Palpitations    REACTION: tackycardia  . Penicillins Rash    Has patient had a PCN reaction causing immediate rash, facial/tongue/throat swelling, SOB or lightheadedness with hypotension: Yes Has patient had a PCN reaction causing severe rash involving mucus membranes or skin necrosis: No Has patient had a PCN reaction that required hospitalization: No Has patient had a PCN reaction occurring within the last 10 years: No If all of the above answers are "NO", then may proceed with Cephalosporin use.   . Corticosteroids Other (See Comments)    Mania    Objective:   VITALS: Per patient if applicable, see vitals. GENERAL: Alert, appears well and in no acute distress. HEENT: Atraumatic, conjunctiva clear, no obvious abnormalities on inspection of external nose and ears. NECK: Normal movements of the head and neck. CARDIOPULMONARY: No increased WOB. Speaking in clear sentences. I:E ratio WNL.  MS: Moves all visible extremities without noticeable abnormality. PSYCH: Pleasant and cooperative, well-groomed. Speech normal rate and rhythm. Affect is appropriate. Insight and judgement are appropriate. Attention is focused, linear, and appropriate.  NEURO: CN grossly intact. Oriented as arrived to appointment on time with no prompting. Moves both UE equally.  SKIN: No obvious lesions, wounds, erythema, or cyanosis noted on face or hands.  Assessment and Plan:   Danielle HatchetSheila was seen today for nasal congestion.  Diagnoses and all orders for this visit:  Sinus pain  Other orders -     doxycycline (VIBRA-TABS) 100 MG tablet; Take 1 tablet (100 mg total) by mouth 2 (two) times daily.   No red flags on exam.  Suspect early sinusitis. Will initiate doxycycline per orders -- she has been prescribed this in the past and has tolerated well. Discussed taking medications as prescribed. Reviewed return precautions including worsening fever,  SOB, worsening cough or other concerns. Push fluids and rest. I recommend that patient follow-up if symptoms worsen or persist despite treatment x 7-10 days, sooner if needed.   . Reviewed expectations re: course of current medical issues. . Discussed self-management of symptoms. . Outlined signs and symptoms indicating need for more acute intervention. . Patient verbalized understanding and all questions were answered. Marland Kitchen. Health Maintenance issues including appropriate healthy diet, exercise, and smoking avoidance were discussed with patient. . See orders for this visit as documented in the electronic medical record.  I discussed the assessment and treatment plan with the patient. The patient was provided an opportunity to ask questions and all were answered. The patient agreed with the plan and demonstrated an understanding of the instructions.   The patient was advised to call back or seek an in-person evaluation if the symptoms worsen or if the condition fails to improve as anticipated.  CMA or LPN served as scribe during this visit. History, Physical, and Plan performed by medical provider. The above documentation has been reviewed  and is accurate and complete.    [place], [place] 05/07/2018

## 2018-05-25 ENCOUNTER — Other Ambulatory Visit: Payer: Self-pay | Admitting: Family Medicine

## 2018-05-26 ENCOUNTER — Ambulatory Visit (INDEPENDENT_AMBULATORY_CARE_PROVIDER_SITE_OTHER): Payer: Medicare Other | Admitting: Family Medicine

## 2018-05-26 ENCOUNTER — Other Ambulatory Visit: Payer: Self-pay

## 2018-05-26 ENCOUNTER — Telehealth: Payer: Self-pay | Admitting: Family Medicine

## 2018-05-26 ENCOUNTER — Encounter: Payer: Self-pay | Admitting: Family Medicine

## 2018-05-26 VITALS — Ht 65.0 in | Wt 177.0 lb

## 2018-05-26 DIAGNOSIS — J441 Chronic obstructive pulmonary disease with (acute) exacerbation: Secondary | ICD-10-CM

## 2018-05-26 DIAGNOSIS — F17200 Nicotine dependence, unspecified, uncomplicated: Secondary | ICD-10-CM

## 2018-05-26 DIAGNOSIS — J301 Allergic rhinitis due to pollen: Secondary | ICD-10-CM

## 2018-05-26 MED ORDER — MONTELUKAST SODIUM 10 MG PO TABS: 10.0000 mg | ORAL_TABLET | Freq: Every day | ORAL | 3 refills | Status: DC

## 2018-05-26 MED ORDER — POTASSIUM CHLORIDE CRYS ER 20 MEQ PO TBCR: EXTENDED_RELEASE_TABLET | ORAL | 0 refills | Status: DC

## 2018-05-26 NOTE — Telephone Encounter (Signed)
See request °

## 2018-05-26 NOTE — Progress Notes (Unsigned)
Virtual Visit via Video   Due to the COVID-19 pandemic, this visit was completed with telemedicine (audio/video) technology to reduce patient and provider exposure as well as to preserve personal protective equipment.   I connected with [name] by a video enabled telemedicine application and verified that I am speaking with the correct person using two identifiers. Location patient: Home Location provider: [facility], Office Persons participating in the virtual visit: Saphera, Brawn, CMA   I discussed the limitations of evaluation and management by telemedicine and the availability of in person appointments. The patient expressed understanding and agreed to proceed.  Care Team   Patient Care Team: [name], DO as PCP - General (Family Medicine) Andrena Mews, DO as Consulting Physician (Family Medicine) Lesle Reek, DDS (Dental General Practice) Ellis Savage, NP as Nurse Practitioner Drema Dallas, DO as Consulting Physician (Neurology)  Subjective:   HPI:   ROS   Patient Active Problem List   Diagnosis Date Noted  . Obesity (BMI 30.0-34.9) 02/12/2018  . Cervical radiculopathy 12/29/2017  . Lumbar back pain 12/29/2017  . Fibromyalgia 11/21/2017  . COPD (chronic obstructive pulmonary disease) (HCC) 11/21/2017  . Affective psychosis, bipolar (HCC)   . Major depressive disorder, recurrent severe without psychotic features (HCC) 12/13/2016  . Bipolar 1 disorder, depressed, severe (HCC) 11/20/2016    Class: Chronic  . Overweight (BMI 25.0-29.9) 03/02/2016  . Personality disorder (HCC) 04/23/2013  . Current every day smoker 02/17/2009  . Essential hypertension 02/17/2009  . Cough 02/17/2009    Social History   Tobacco Use  . Smoking status: Current Every Day Smoker    Packs/day: 1.00    Years: 40.00    Pack years: 40.00    Types: Cigarettes  . Smokeless tobacco: Never Used  Substance Use Topics  . Alcohol use: No     Current Outpatient Medications:  .  atorvastatin (LIPITOR) 20 MG tablet, Take 1 tablet (20 mg total) by mouth daily., Disp: 90 tablet, Rfl: 3 .  azelastine (ASTELIN) 0.1 % nasal spray, Place 2 sprays into both nostrils 2 (two) times daily. Use in each nostril as directed, Disp: 30 mL, Rfl: 12 .  Cholecalciferol (D3 MAXIMUM STRENGTH PO), Take 2,500 mg by mouth daily., Disp: , Rfl:  .  clorazepate (TRANXENE) 7.5 MG tablet, Take 7.5 mg by mouth daily. , Disp: , Rfl:  .  Cyanocobalamin (B-12) 2500 MCG TABS, Take 2,500 mg by mouth daily., Disp: , Rfl:  .  furosemide (LASIX) 20 MG tablet, TAKE ONE TABLET BY MOUTH EVERY MORNING ALONG WITH POTASSIUM, Disp: 90 tablet, Rfl: 1 .  gabapentin (NEURONTIN) 600 MG tablet, 600 mg 3 (three) times daily. , Disp: , Rfl:  .  Ipratropium-Albuterol (COMBIVENT) 20-100 MCG/ACT AERS respimat, Inhale 1 puff into the lungs every 6 (six) hours., Disp: 1 Inhaler, Rfl: 3 .  IRON, FERROUS SULFATE, PO, Take 65 mg by mouth daily., Disp: , Rfl:  .  meclizine (ANTIVERT) 25 MG tablet, Take 1 tablet (25 mg total) by mouth every 8 (eight) hours as needed for dizziness., Disp: 20 tablet, Rfl: 0 .  modafinil (PROVIGIL) 100 MG tablet, Take 100 mg by mouth 2 (two) times daily. , Disp: , Rfl:  .  montelukast (SINGULAIR) 10 MG tablet, Take 1 tablet (10 mg total) by mouth at bedtime., Disp: 30 tablet, Rfl: 3 .  PARoxetine (PAXIL) 20 MG tablet, Take 20 mg by mouth daily. , Disp: , Rfl:  .  PARoxetine (PAXIL)  40 MG tablet, Take 40 mg by mouth daily. , Disp: , Rfl:  .  potassium chloride SA (K-DUR) 20 MEQ tablet, TAKE ONE TABLET BY MOUTH EVERY MORNING ALONG WITH FUROSEMIDE, Disp: 90 tablet, Rfl: 0 .  traZODone (DESYREL) 150 MG tablet, 3 (three) times daily. , Disp: , Rfl:   Allergies  Allergen Reactions  . Moxifloxacin Hcl Palpitations    REACTION: tackycardia  . Penicillins Rash    Has patient had a PCN reaction causing immediate rash, facial/tongue/throat swelling, SOB or lightheadedness  with hypotension: Yes Has patient had a PCN reaction causing severe rash involving mucus membranes or skin necrosis: No Has patient had a PCN reaction that required hospitalization: No Has patient had a PCN reaction occurring within the last 10 years: No If all of the above answers are "NO", then may proceed with Cephalosporin use.   . Corticosteroids Other (See Comments)    Mania    Objective:   VITALS: Per patient if applicable, see vitals. GENERAL: Alert, appears well and in no acute distress. HEENT: Atraumatic, conjunctiva clear, no obvious abnormalities on inspection of external nose and ears. NECK: Normal movements of the head and neck. CARDIOPULMONARY: No increased WOB. Speaking in clear sentences. I:E ratio WNL.  MS: Moves all visible extremities without noticeable abnormality. PSYCH: Pleasant and cooperative, well-groomed. Speech normal rate and rhythm. Affect is appropriate. Insight and judgement are appropriate. Attention is focused, linear, and appropriate.  NEURO: CN grossly intact. Oriented as arrived to appointment on time with no prompting. Moves both UE equally.  SKIN: No obvious lesions, wounds, erythema, or cyanosis noted on face or hands.  Depression screen PHQ 2/9 09/18/2017  Decreased Interest 3  Down, Depressed, Hopeless 1  PHQ - 2 Score 4  Altered sleeping 1  Tired, decreased energy 3  Change in appetite 0  Feeling bad or failure about yourself  1  Trouble concentrating 1  Moving slowly or fidgety/restless 0  Suicidal thoughts 0  PHQ-9 Score 10  Some encounter information is confidential and restricted. Go to Review Flowsheets activity to see all data.    Assessment and Plan:   There are no diagnoses linked to this encounter.  Marland Kitchen. COVID-19 Education: The signs and symptoms of COVID-19 were discussed with the patient and how to seek care for testing if needed. The importance of social distancing was discussed today. . Reviewed expectations re: course of  current medical issues. . Discussed self-management of symptoms. . Outlined signs and symptoms indicating need for more acute intervention. . Patient verbalized understanding and all questions were answered. Marland Kitchen. Health Maintenance issues including appropriate healthy diet, exercise, and smoking avoidance were discussed with patient. . See orders for this visit as documented in the electronic medical record.  Donnamarie PoagJoellen Y Yesmin Mutch, CMA  Records requested if needed. Time spent: *** minutes, of which >50% was spent in obtaining information about her symptoms, reviewing her previous labs, evaluations, and treatments, counseling her about her condition (please see the discussed topics above), and developing a plan to further investigate it; she had a number of questions which I addressed.

## 2018-05-26 NOTE — Telephone Encounter (Signed)
Copied from CRM [id number]. Topic: Quick Communication - Rx Refill/Question >> May 26, 2018  4:31 PM Jens Som A wrote: Medication: potassium chloride SA (K-DUR) 20 MEQ tablet [[id number]] The pharmacy did not receive this script can it be resent.  Has the patient contacted their pharmacy? Yes  (Agent: If no, request that the patient contact the pharmacy for the refill.) (Agent: If yes, when and what did the pharmacy advise?)  Preferred Pharmacy (with phone number or street name):  [facility] [place], [place] - [address] [phone] (Phone) [phone] (Fax)     Agent: Please be advised that RX refills may take up to 3 business days. We ask that you follow-up with your pharmacy.

## 2018-05-26 NOTE — Progress Notes (Signed)
Virtual Visit via Video   Due to the COVID-19 pandemic, this visit was completed with telemedicine (audio/video) technology to reduce patient and provider exposure as well as to preserve personal protective equipment.   I connected with Danielle Cox by a video enabled telemedicine application and verified that I am speaking with the correct person using two identifiers. Location patient: Home Location provider: [facility], Office Persons participating in the virtual visit: Anayansi, Rundquist, DO   I discussed the limitations of evaluation and management by telemedicine and the availability of in person appointments. The patient expressed understanding and agreed to proceed.  Care Team   Patient Care Team: [name], DO as PCP - General (Family Medicine) Andrena Mews, DO as Consulting Physician (Family Medicine) Lesle Reek, DDS (Dental General Practice) Ellis Savage, NP as Nurse Practitioner Drema Dallas, DO as Consulting Physician (Neurology)  Subjective:   HPI: Smoker, with Hx of allergies. Increased runny nose and congestion, sinus pressure. No fever. Minimal cough. No wheeze. Taking antihistamine without relief.   Review of Systems  Constitutional: Negative for chills and fever.  HENT: Positive for congestion, ear pain and sinus pain.   Eyes: Negative for blurred vision and double vision.  Respiratory: Positive for cough, sputum production and wheezing. Negative for shortness of breath.   Cardiovascular: Negative for chest pain and palpitations.  Gastrointestinal: Positive for nausea. Negative for vomiting.  Genitourinary: Negative for dysuria, frequency and urgency.  Musculoskeletal: Negative for myalgias and neck pain.  Skin: Negative for rash.  Neurological: Positive for headaches. Negative for dizziness.  Endo/Heme/Allergies: Does not bruise/bleed easily.  Psychiatric/Behavioral: Positive for depression. Negative for suicidal ideas.      Patient Active Problem List   Diagnosis Date Noted  . Obesity (BMI 30.0-34.9) 02/12/2018  . Cervical radiculopathy 12/29/2017  . Lumbar back pain 12/29/2017  . Fibromyalgia 11/21/2017  . COPD (chronic obstructive pulmonary disease) (HCC) 11/21/2017  . Affective psychosis, bipolar (HCC)   . Major depressive disorder, recurrent severe without psychotic features (HCC) 12/13/2016  . Bipolar 1 disorder, depressed, severe (HCC) 11/20/2016    Class: Chronic  . Overweight (BMI 25.0-29.9) 03/02/2016  . Personality disorder (HCC) 04/23/2013  . Current every day smoker 02/17/2009  . Essential hypertension 02/17/2009  . Cough 02/17/2009    Social History   Tobacco Use  . Smoking status: Current Every Day Smoker    Packs/day: 1.00    Years: 40.00    Pack years: 40.00    Types: Cigarettes  . Smokeless tobacco: Never Used  Substance Use Topics  . Alcohol use: No    Current Outpatient Medications:  .  ARIPiprazole (ABILIFY) 10 MG tablet, Take 10 mg by mouth daily. , Disp: , Rfl:  .  atorvastatin (LIPITOR) 20 MG tablet, Take 1 tablet (20 mg total) by mouth daily., Disp: 90 tablet, Rfl: 3 .  azelastine (ASTELIN) 0.1 % nasal spray, Place 2 sprays into both nostrils 2 (two) times daily. Use in each nostril as directed, Disp: 30 mL, Rfl: 12 .  cetirizine (ZYRTEC) 10 MG tablet, Take one tab a night., Disp: 30 tablet, Rfl: 11 .  Cholecalciferol (D3 MAXIMUM STRENGTH PO), Take 2,500 mg by mouth daily., Disp: , Rfl:  .  clorazepate (TRANXENE) 7.5 MG tablet, Take 7.5 mg by mouth daily. , Disp: , Rfl:  .  Cyanocobalamin (B-12) 2500 MCG TABS, Take 2,500 mg by mouth daily., Disp: , Rfl:  .  doxycycline (VIBRA-TABS) 100 MG tablet, Take 1 tablet (  100 mg total) by mouth 2 (two) times daily., Disp: 20 tablet, Rfl: 0 .  furosemide (LASIX) 20 MG tablet, TAKE ONE TABLET BY MOUTH EVERY MORNING ALONG WITH POTASSIUM, Disp: 90 tablet, Rfl: 1 .  gabapentin (NEURONTIN) 600 MG tablet, 600 mg 3 (three) times daily. ,  Disp: , Rfl:  .  Ipratropium-Albuterol (COMBIVENT) 20-100 MCG/ACT AERS respimat, Inhale 1 puff into the lungs every 6 (six) hours., Disp: 1 Inhaler, Rfl: 3 .  IRON, FERROUS SULFATE, PO, Take 65 mg by mouth daily., Disp: , Rfl:  .  meclizine (ANTIVERT) 25 MG tablet, Take 1 tablet (25 mg total) by mouth every 8 (eight) hours as needed for dizziness. (Patient not taking: Reported on 05/07/2018), Disp: 20 tablet, Rfl: 0 .  modafinil (PROVIGIL) 100 MG tablet, Take 100 mg by mouth 2 (two) times daily. , Disp: , Rfl:  .  PARoxetine (PAXIL) 20 MG tablet, Take 20 mg by mouth daily. , Disp: , Rfl:  .  PARoxetine (PAXIL) 40 MG tablet, Take 40 mg by mouth daily. , Disp: , Rfl:  .  potassium chloride SA (K-DUR) 20 MEQ tablet, TAKE ONE TABLET BY MOUTH EVERY MORNING ALONG WITH FUROSEMIDE, Disp: 90 tablet, Rfl: 0 .  traZODone (DESYREL) 150 MG tablet, 3 (three) times daily. , Disp: , Rfl:   Allergies  Allergen Reactions  . Moxifloxacin Hcl Palpitations    REACTION: tackycardia  . Penicillins Rash    Has patient had a PCN reaction causing immediate rash, facial/tongue/throat swelling, SOB or lightheadedness with hypotension: Yes Has patient had a PCN reaction causing severe rash involving mucus membranes or skin necrosis: No Has patient had a PCN reaction that required hospitalization: No Has patient had a PCN reaction occurring within the last 10 years: No If all of the above answers are "NO", then may proceed with Cephalosporin use.   . Corticosteroids Other (See Comments)    Mania   Objective:   VITALS: Per patient if applicable, see vitals. GENERAL: Alert, appears well and in no acute distress. HEENT: Atraumatic, conjunctiva clear, no obvious abnormalities on inspection of external nose and ears. NECK: Normal movements of the head and neck. CARDIOPULMONARY: No increased WOB. Speaking in clear sentences. I:E ratio WNL.  MS: Moves all visible extremities without noticeable abnormality. PSYCH: Pleasant  and cooperative, well-groomed. Speech normal rate and rhythm. Affect is appropriate. Insight and judgement are appropriate. Attention is focused, linear, and appropriate.  NEURO: CN grossly intact. Oriented as arrived to appointment on time with no prompting. Moves both UE equally.  SKIN: No obvious lesions, wounds, erythema, or cyanosis noted on face or hands.  Depression screen PHQ 2/9 09/18/2017  Decreased Interest 3  Down, Depressed, Hopeless 1  PHQ - 2 Score 4  Altered sleeping 1  Tired, decreased energy 3  Change in appetite 0  Feeling bad or failure about yourself  1  Trouble concentrating 1  Moving slowly or fidgety/restless 0  Suicidal thoughts 0  PHQ-9 Score 10  Some encounter information is confidential and restricted. Go to Review Flowsheets activity to see all data.    Assessment and Plan:   Milahn was seen today for nasal congestion.  Diagnoses and all orders for this visit:  Seasonal allergic rhinitis due to pollen Reviewed block box warning.  -     montelukast (SINGULAIR) 10 MG tablet; Take 1 tablet (10 mg total) by mouth at bedtime.  Current every day smoker I advised patient to quit smoking, and offered support. Kelly QUITLINE: 1-800-QUIT-NOW (  [phone]) [phone])  Chronic obstructive pulmonary disease with acute exacerbation (HCC)   . COVID-19 Education: The signs and symptoms of COVID-19 were discussed with the patient and how to seek care for testing if needed. The importance of social distancing was discussed today. . Reviewed expectations re: course of current medical issues. . Discussed self-management of symptoms. . Outlined signs and symptoms indicating need for more acute intervention. . Patient verbalized understanding and all questions were answered. Marland Kitchen. Health Maintenance issues including appropriate healthy diet, exercise, and smoking avoidance were discussed with patient. . See orders for this visit as documented in the electronic medical record.  Helane RimaErica  Jaleea Alesi, DO  Records requested if needed. Time spent: 25 minutes, of which >50% was spent in obtaining information about her symptoms, reviewing her previous labs, evaluations, and treatments, counseling her about her condition (please see the discussed topics above), and developing a plan to further investigate it; she had a number of questions which I addressed.

## 2018-05-26 NOTE — Telephone Encounter (Signed)
Last OV 04/21/18 Last refill 04/29/18 #30/0 Next OV not scheduled

## 2018-05-26 NOTE — Telephone Encounter (Signed)
Re-sent to pharmacy.

## 2018-05-29 ENCOUNTER — Telehealth: Payer: Self-pay | Admitting: Family Medicine

## 2018-05-29 NOTE — Telephone Encounter (Signed)
Called and let patient know Dr. Earlene Plater was not in the office today and I did not see any mention of antibiotic in her office note.  She would like a call back in the morning.

## 2018-05-29 NOTE — Telephone Encounter (Signed)
Copied from CRM [id number]. Topic: General - Other >> May 29, 2018 12:17 PM Fanny Bien wrote: Reason for CRM: pt called and seemed upset. Pt states that she has called 4 time to see if she needs to be taking an antibiotic. Pt would like a call back regarding.

## 2018-05-30 ENCOUNTER — Encounter: Payer: Self-pay | Admitting: Family Medicine

## 2018-05-30 ENCOUNTER — Other Ambulatory Visit: Payer: Self-pay

## 2018-05-30 DIAGNOSIS — J329 Chronic sinusitis, unspecified: Secondary | ICD-10-CM

## 2018-05-30 DIAGNOSIS — B9689 Other specified bacterial agents as the cause of diseases classified elsewhere: Secondary | ICD-10-CM

## 2018-05-30 MED ORDER — AZITHROMYCIN 250 MG PO TABS: ORAL_TABLET | ORAL | 0 refills | Status: DC

## 2018-05-30 NOTE — Telephone Encounter (Signed)
See note

## 2018-05-30 NOTE — Telephone Encounter (Signed)
Called Dr. Earlene Plater advised to call in Z pack called patient and informed.

## 2018-05-30 NOTE — Telephone Encounter (Signed)
Called patient l/m to call office  

## 2018-05-30 NOTE — Telephone Encounter (Signed)
Forwarding to Jarold Motto, Dr Earlene Plater will not be back into the office until Tuesday.

## 2018-05-30 NOTE — Telephone Encounter (Signed)
Patient returning call to [facility]. Please advise.

## 2018-07-07 ENCOUNTER — Ambulatory Visit: Payer: Self-pay | Admitting: Neurology

## 2018-07-17 ENCOUNTER — Encounter: Payer: Self-pay | Admitting: Family Medicine

## 2018-07-17 ENCOUNTER — Ambulatory Visit (INDEPENDENT_AMBULATORY_CARE_PROVIDER_SITE_OTHER): Payer: Medicare Other | Admitting: Family Medicine

## 2018-07-17 VITALS — HR 90 | Temp 98.0°F

## 2018-07-17 DIAGNOSIS — F609 Personality disorder, unspecified: Secondary | ICD-10-CM

## 2018-07-17 DIAGNOSIS — R5382 Chronic fatigue, unspecified: Secondary | ICD-10-CM | POA: Diagnosis not present

## 2018-07-17 DIAGNOSIS — F332 Major depressive disorder, recurrent severe without psychotic features: Secondary | ICD-10-CM

## 2018-07-17 DIAGNOSIS — F314 Bipolar disorder, current episode depressed, severe, without psychotic features: Secondary | ICD-10-CM | POA: Diagnosis not present

## 2018-07-17 NOTE — Progress Notes (Signed)
    Chief Complaint:  Danielle Cox is a 58 y.o. female who presents today for a virtual office visit with a chief complaint of depression.   Assessment/Plan:  Depression/bipolar/personality disorder/fatigue No reported SI or HI.  She is on several sedating medications in addition to her psychotropic medications which could likely contribute to some of the fatigue that she is having.  She is working with psychiatry to tweak her current medications.  Given her worsening fatigue over the last few weeks, we will check CBC, C met, and TSH to look for other possible causes, however I think most likely her increasing fatigue is due to her medications.  Recommended patient follow-up with PCP and psychiatry soon as previously planned.     Subjective:  HPI:  Depression  / Bipolar affective disorder Patient currently follows with psychiatry for this.  Symptoms have worsened over the last 4 to 6 weeks.  She is currently on several medications including Tranxene 7.5 mg daily, Paxil 60 mg daily, trazodone 150 mg 3 times daily, and Resporal 1 mg daily.  She has been working with her psychiatrist to change her doses due to her recent worsening symptoms.  Overall feels much more fatigued and down.  She was advised to follow-up with her primary physician to let them know about her symptoms and changes and medications.  Denies any recent illnesses.  No fever chills.  Normal sleep habits.  No reported constipation or diarrhea.  She has not noticed anything that makes her symptoms better or worse.   She is also on Singulair for cough/allergies and gabapentin for her fibromyalgia.  ROS: Per HPI  PMH: She reports that she has been smoking cigarettes. She has a 40.00 pack-year smoking history. She has never used smokeless tobacco. She reports that she does not drink alcohol or use drugs.      Objective/Observations  Physical Exam: Gen: NAD, resting comfortably Pulm: Normal work of breathing Neuro: Grossly  normal, moves all extremities Psych: Normal affect and thought content  Virtual Visit via Video   I connected with Benn Moulder on 07/17/18 at 10:00 AM EDT by a video enabled telemedicine application and verified that I am speaking with the correct person using two identifiers. I discussed the limitations of evaluation and management by telemedicine and the availability of in person appointments. The patient expressed understanding and agreed to proceed.   Patient location: Home Provider location: [place] participating in the virtual visit: Myself and Patient     Algis Greenhouse. Jerline Pain, MD 07/17/2018 10:29 AM

## 2018-07-21 ENCOUNTER — Telehealth: Payer: Self-pay | Admitting: Family Medicine

## 2018-07-21 DIAGNOSIS — R5382 Chronic fatigue, unspecified: Secondary | ICD-10-CM

## 2018-07-21 DIAGNOSIS — R42 Dizziness and giddiness: Secondary | ICD-10-CM

## 2018-07-21 DIAGNOSIS — F17200 Nicotine dependence, unspecified, uncomplicated: Secondary | ICD-10-CM

## 2018-07-21 DIAGNOSIS — F314 Bipolar disorder, current episode depressed, severe, without psychotic features: Secondary | ICD-10-CM

## 2018-07-21 NOTE — Telephone Encounter (Signed)
See note

## 2018-07-21 NOTE — Telephone Encounter (Signed)
Patient is feeling very badly and anxious to hear Dr. Juleen China response. Please advise. Thank you.

## 2018-07-21 NOTE — Telephone Encounter (Signed)
Patient is calling back that she still is not feeling well. Still feeling fatigued. Her body is still a heavy weight, dopey, and confusion. Patient has been smoking more, She has been feeling this way for 6 weeks. She has followed up with Psychiatry.   It was recommend for her to have blood work done. Can an order be placed for   CBC, C met, and TSH?   Requesting Joellen to give her a call back.

## 2018-07-21 NOTE — Telephone Encounter (Signed)
I have called patient states that labs were not ordered per Dr. Ellwood Handler note. She states that she is not eating at all and has dry heaves when she takes her medications. She is going to be around 7pm each night and sleeping 12 hrs or more each night due to "feeling dopey and dizzy all the time".  She does have a follow up with psychology the only changes have been the coming off resperdal to Abilify. That change only started about 5 days ago. She started feeling this way about a month ago. She keeps a log of how she feels each day and this is going into week 5.

## 2018-07-21 NOTE — Telephone Encounter (Signed)
Please advise patient was seen by parker on 7/2 not sure what next step need to be?

## 2018-07-22 NOTE — Telephone Encounter (Signed)
Please see if Dr. Jerline Pain can weigh in on this, perhaps he could order labs and reassess if appropriate.

## 2018-07-22 NOTE — Telephone Encounter (Signed)
Since we have no openings this week, please ask Dr. Jerline Pain or Aldona Bar to order labs and f/u with her.

## 2018-07-22 NOTE — Telephone Encounter (Signed)
Can you do?

## 2018-07-22 NOTE — Telephone Encounter (Signed)
Lab orders placed. Called pt and schedule appointment for tomorrow.

## 2018-07-22 NOTE — Telephone Encounter (Signed)
[place] with me. Please place orders for CBC, CMET, and TSH and ask pt to schedule lab visit.   Algis Greenhouse. Jerline Pain, MD 07/22/2018 1:42 PM

## 2018-07-23 ENCOUNTER — Other Ambulatory Visit (INDEPENDENT_AMBULATORY_CARE_PROVIDER_SITE_OTHER): Payer: Medicare Other

## 2018-07-23 ENCOUNTER — Other Ambulatory Visit: Payer: Self-pay

## 2018-07-23 DIAGNOSIS — F314 Bipolar disorder, current episode depressed, severe, without psychotic features: Secondary | ICD-10-CM | POA: Diagnosis not present

## 2018-07-23 DIAGNOSIS — R42 Dizziness and giddiness: Secondary | ICD-10-CM

## 2018-07-23 DIAGNOSIS — F17200 Nicotine dependence, unspecified, uncomplicated: Secondary | ICD-10-CM

## 2018-07-23 DIAGNOSIS — R5382 Chronic fatigue, unspecified: Secondary | ICD-10-CM | POA: Diagnosis not present

## 2018-07-23 LAB — COMPREHENSIVE METABOLIC PANEL
ALT: 23 U/L (ref 0–35)
AST: 15 U/L (ref 0–37)
Albumin: 4.4 g/dL (ref 3.5–5.2)
Alkaline Phosphatase: 68 U/L (ref 39–117)
BUN: 18 mg/dL (ref 6–23)
CO2: 27 mEq/L (ref 19–32)
Calcium: 9.4 mg/dL (ref 8.4–10.5)
Chloride: 104 mEq/L (ref 96–112)
Creatinine, Ser: 0.82 mg/dL (ref 0.40–1.20)
GFR: 72.08 mL/min (ref >60.00)
Glucose, Bld: 111 mg/dL — ABNORMAL HIGH (ref 70–99)
Potassium: 4.5 mEq/L (ref 3.5–5.1)
Sodium: 141 mEq/L (ref 135–145)
Total Bilirubin: 0.2 mg/dL (ref 0.2–1.2)
Total Protein: 6.6 g/dL (ref 6.0–8.3)

## 2018-07-23 LAB — CBC
HCT: 45.5 % (ref 36.0–46.0)
Hemoglobin: 15 g/dL (ref 12.0–15.0)
MCHC: 33 g/dL (ref 30.0–36.0)
MCV: 96.5 fl (ref 78.0–100.0)
Platelets: 301 10*3/uL (ref 150.0–400.0)
RBC: 4.71 Mil/uL (ref 3.87–5.11)
RDW: 12.9 % (ref 11.5–15.5)
WBC: 13.2 10*3/uL — ABNORMAL HIGH (ref 4.0–10.5)

## 2018-07-23 LAB — TSH: TSH: 0.61 u[IU]/mL (ref 0.35–4.50)

## 2018-07-24 NOTE — Progress Notes (Signed)
Please inform patient of the following:  Her white blood cell count was slight elevated as was her blood sugar but there are no obvious causes her for her drowsiness. We should recheck her blood counts again in a few months. Recommend follow up with PCP and/or psychiatry if still having issues with sedation/drowsiness.  Danielle Cox. Jerline Pain, MD 07/24/2018 11:06 AM

## 2018-07-25 MED ORDER — DOXYCYCLINE HYCLATE 100 MG PO CAPS: 100.0000 mg | ORAL_CAPSULE | Freq: Two times a day (BID) | ORAL | 0 refills | Status: DC

## 2018-07-25 NOTE — Addendum Note (Signed)
Addended by: Jasper Loser on: 07/25/2018 10:48 AM   Modules accepted: Orders

## 2018-07-25 NOTE — Telephone Encounter (Signed)
Called pt and advised. Confirmed pharmacy, rx sent via Epic.

## 2018-07-25 NOTE — Telephone Encounter (Signed)
Pt called in and stated that she spoke with Dr Ellwood Handler nurse yesterday and she gave her symptoms .  Stated she was blowing out green and yellow mucus.  She stated that her face hurts and she knows she has a sinus infection.  She stated that Dr Ellwood Handler nurse was going to talk to Dr Jerline Pain and get back with her.    Best number  [phone]

## 2018-07-25 NOTE — Telephone Encounter (Signed)
Vivi Barrack, MD  Jasper Loser, CMA        Ok to send in doxycycline 100mg  bid x 7 days.   Algis Greenhouse. Jerline Pain, MD  07/24/2018 3:53 PM

## 2018-08-02 ENCOUNTER — Ambulatory Visit (INDEPENDENT_AMBULATORY_CARE_PROVIDER_SITE_OTHER): Payer: Medicare Other | Admitting: Family Medicine

## 2018-08-02 ENCOUNTER — Encounter: Payer: Self-pay | Admitting: Family Medicine

## 2018-08-02 ENCOUNTER — Other Ambulatory Visit: Payer: Self-pay

## 2018-08-02 DIAGNOSIS — D72829 Elevated white blood cell count, unspecified: Secondary | ICD-10-CM | POA: Diagnosis not present

## 2018-08-02 DIAGNOSIS — M797 Fibromyalgia: Secondary | ICD-10-CM | POA: Diagnosis not present

## 2018-08-02 DIAGNOSIS — G43719 Chronic migraine without aura, intractable, without status migrainosus: Secondary | ICD-10-CM

## 2018-08-02 MED ORDER — TIZANIDINE HCL 4 MG PO TABS: 4.0000 mg | ORAL_TABLET | Freq: Every evening | ORAL | 0 refills | Status: DC | PRN

## 2018-08-02 NOTE — Patient Instructions (Signed)
There are no preventive care reminders to display for this patient.  Depression screen PHQ 2/9 09/18/2017  Decreased Interest 3  Down, Depressed, Hopeless 1  PHQ - 2 Score 4  Altered sleeping 1  Tired, decreased energy 3  Change in appetite 0  Feeling bad or failure about yourself  1  Trouble concentrating 1  Moving slowly or fidgety/restless 0  Suicidal thoughts 0  PHQ-9 Score 10  Some encounter information is confidential and restricted. Go to Review Flowsheets activity to see all data.

## 2018-08-02 NOTE — Progress Notes (Signed)
Phone [phone] I acted as a Education administrator for Dr. Dyanne Iha, LPN Subjective:  Virtual visit via Video note. Chief complaint: Chief Complaint  Patient presents with  . Migraine    This visit type was conducted due to national recommendations for restrictions regarding the COVID-19 Pandemic (e.g. social distancing).  This format is felt to be most appropriate for this patient at this time balancing risks to patient and risks to population by having him in for in person visit.  No physical exam was performed (except for noted visual exam or audio findings with Telehealth visits).    Our team/I connected with Benn Moulder at 10:40 AM EDT by a video enabled telemedicine application (doxy.me or caregility through epic) and verified that I am speaking with the correct person using two identifiers.  Location patient: Home[place] Location provider: [facility], office Persons participating in the virtual visit:  patient  Our team/I discussed the limitations of evaluation and management by telemedicine and the availability of in person appointments. In light of current covid-19 pandemic, patient also understands that we are trying to protect them by minimizing in office contact if at all possible.  The patient expressed consent for telemedicine visit and agreed to proceed. Patient understands insurance will be billed.   ROS-  No fever/chills reported. Has body aches from fibromyalgia. Continued headaches.    Past Medical History-  Patient Active Problem List   Diagnosis Date Noted  . Obesity (BMI 30.0-34.9) 02/12/2018  . Cervical radiculopathy 12/29/2017  . Lumbar back pain 12/29/2017  . Fibromyalgia 11/21/2017  . COPD (chronic obstructive pulmonary disease) ([place]) 11/21/2017  . Affective psychosis, bipolar ([place])   . Major depressive disorder, recurrent severe without psychotic features ([place]) 12/13/2016  . Bipolar 1 disorder, depressed, severe ([place]) 11/20/2016    Class: Chronic  .  Overweight (BMI 25.0-29.9) 03/02/2016  . Personality disorder ([place]) 04/23/2013  . Current every day smoker 02/17/2009  . Essential hypertension 02/17/2009  . Cough 02/17/2009    Medications- reviewed and updated Current Outpatient Medications  Medication Sig Dispense Refill  . atorvastatin (LIPITOR) 20 MG tablet Take 1 tablet (20 mg total) by mouth daily. 90 tablet 3  . azelastine (ASTELIN) 0.1 % nasal spray Place 2 sprays into both nostrils 2 (two) times daily. Use in each nostril as directed 30 mL 12  . Cholecalciferol (D3 MAXIMUM STRENGTH PO) Take 2,500 mg by mouth daily.    . clorazepate (TRANXENE) 7.5 MG tablet Take 7.5 mg by mouth daily.     . Cyanocobalamin (B-12) 2500 MCG TABS Take 2,500 mg by mouth daily.    . furosemide (LASIX) 20 MG tablet TAKE ONE TABLET BY MOUTH EVERY MORNING ALONG WITH POTASSIUM 90 tablet 1  . gabapentin (NEURONTIN) 600 MG tablet 600 mg 3 (three) times daily.     . Ipratropium-Albuterol (COMBIVENT) 20-100 MCG/ACT AERS respimat Inhale 1 puff into the lungs every 6 (six) hours. 1 Inhaler 3  . IRON, FERROUS SULFATE, PO Take 65 mg by mouth daily.    . meclizine (ANTIVERT) 25 MG tablet Take 1 tablet (25 mg total) by mouth every 8 (eight) hours as needed for dizziness. 20 tablet 0  . modafinil (PROVIGIL) 200 MG tablet Take 200 mg by mouth daily.    . montelukast (SINGULAIR) 10 MG tablet Take 1 tablet (10 mg total) by mouth at bedtime. 30 tablet 3  . PARoxetine (PAXIL) 40 MG tablet Take 60 mg by mouth daily.     . potassium chloride SA (  K-DUR) 20 MEQ tablet One tab daily 90 tablet 0  . risperiDONE (RISPERDAL) 1 MG tablet Take 2 mg by mouth 2 (two) times daily.     . traZODone (DESYREL) 150 MG tablet 3 (three) times daily.     Marland Kitchen. tiZANidine (ZANAFLEX) 4 MG tablet Take 1 tablet (4 mg total) by mouth at bedtime as needed for muscle spasms. 20 tablet 0   No current facility-administered medications for this visit.      Objective:  LMP  (LMP Unknown)  self reported  vitals Gen: NAD, resting comfortably, appears tearful when discussing pain Lungs: nonlabored, normal respiratory rate  Skin: appears dry, no obvious rash   Assessment and Plan   #  Migraine/fibromyalgia/leukocytosis S: Pt c/o headaches everyday, also c/o pain in neck, shoulders and joint pain since June at least, has gotten worse after doing heavy duty cleaning with daughter 3 weeks ago. Pt said she saw Dr. Berline Choughigby awhile ago and he wanted to send her to Neurosurgeon but she never went. Had steroid injection back in Feb with Dr. Everlena CooperJaffe, helped for little while but had thrush- does not do well with steroids and would prefer to avoid. Saw DR. Parker a few weeks ago for fatigue and only abnormality was leukocytosis and mild.   She feels like she is throbbing in every muscle and joint in her body- knees, hip, back, neck, biceps, abdominal muscles. Feels like hands are only area not bothering her. Feels like she always has a headache or migraine. Takes aleve in AM and tylenol in PM daily for headaches but they continue to come. She never tried relpax due to cost. She is hesitant as doesn't want to take more medicine as she is worried about it causing her fatigue.   Feels she is doing well on bipolar medicine and is hesitant with muscle relaxers as doesn't want to get too tired. Heat/ice do not help. Getting good nights rest over 10 hours.  A/P: Migraines with poor control likely due to rebound headaches from regular tylenol/ibuprofen- advised follow up with Dr. Everlena CooperJaffe- patient will consider  Chronic pain with fibromyalgia- patient is hesitant about medication changes and I told her as I am not her PCP would like to avoid any long term medication changes- I was able to secure a visit for her on Friday in AM with Dr. Earlene PlaterWallace- patient would like to come fasting and update labs. She will need to be screened before visit for covid 19 of course (closer to time of visit)  In the end she asked me to prescribe a  muscle relaxant at least for bed time to hopefully help tension in AM when she wakes up- she doesn't think flexeril helped but is willing to trial tizanidine Dr. Berline Choughigby previously gave her last year- I sent this in and advised no driving for 8 hours after use.   Leukocytosis/chronic fatigue- noted on last labs- will need to be repeat likely with differential at visit with Dr. Earlene PlaterWallace   Recommended follow up:  With PCP on Friday- most appropriate care venue for patient with chronic conditions needing change of her chronic regimen Future Appointments  Date Time Provider Department Center  08/08/2018  7:40 AM Helane RimaWallace, Erica, DO [facility]   Lab/Order associations:   ICD-10-CM   1. Fibromyalgia  M79.7   2. Intractable chronic migraine without aura and without status migrainosus  G43.719   3. Leukocytosis, unspecified type  D72.829    Meds ordered this encounter  Medications  .  tiZANidine (ZANAFLEX) 4 MG tablet    Sig: Take 1 tablet (4 mg total) by mouth at bedtime as needed for muscle spasms.    Dispense:  20 tablet    Refill:  0   Return precautions advised.  Tana ConchStephen Rogan Ecklund, MD

## 2018-08-08 ENCOUNTER — Ambulatory Visit: Payer: Medicare Other | Admitting: Family Medicine

## 2018-08-13 ENCOUNTER — Ambulatory Visit (INDEPENDENT_AMBULATORY_CARE_PROVIDER_SITE_OTHER): Payer: Medicare Other | Admitting: Family Medicine

## 2018-08-13 ENCOUNTER — Other Ambulatory Visit: Payer: Self-pay

## 2018-08-13 ENCOUNTER — Encounter: Payer: Self-pay | Admitting: Family Medicine

## 2018-08-13 VITALS — Ht 65.0 in | Wt 177.0 lb

## 2018-08-13 DIAGNOSIS — Z20828 Contact with and (suspected) exposure to other viral communicable diseases: Secondary | ICD-10-CM | POA: Diagnosis not present

## 2018-08-13 DIAGNOSIS — Z13220 Encounter for screening for lipoid disorders: Secondary | ICD-10-CM

## 2018-08-13 DIAGNOSIS — F314 Bipolar disorder, current episode depressed, severe, without psychotic features: Secondary | ICD-10-CM

## 2018-08-13 DIAGNOSIS — R5382 Chronic fatigue, unspecified: Secondary | ICD-10-CM

## 2018-08-13 DIAGNOSIS — R52 Pain, unspecified: Secondary | ICD-10-CM

## 2018-08-13 DIAGNOSIS — M797 Fibromyalgia: Secondary | ICD-10-CM

## 2018-08-13 DIAGNOSIS — Z20822 Contact with and (suspected) exposure to covid-19: Secondary | ICD-10-CM

## 2018-08-13 DIAGNOSIS — R7301 Impaired fasting glucose: Secondary | ICD-10-CM

## 2018-08-13 DIAGNOSIS — J449 Chronic obstructive pulmonary disease, unspecified: Secondary | ICD-10-CM

## 2018-08-13 DIAGNOSIS — F17200 Nicotine dependence, unspecified, uncomplicated: Secondary | ICD-10-CM

## 2018-08-13 NOTE — Progress Notes (Signed)
Virtual Visit via Video   Due to the COVID-19 pandemic, this visit was completed with telemedicine (audio/video) technology to reduce patient and provider exposure as well as to preserve personal protective equipment.   I connected with Danielle MartinezSheila K Cox by a video enabled telemedicine application and verified that I am speaking with the correct person using two identifiers. Location patient: Home Location provider: [facility], Office Persons participating in the virtual visit: Skip MayerSheila K Cox, Leya Paige, DO   I discussed the limitations of evaluation and management by telemedicine and the availability of in person appointments. The patient expressed understanding and agreed to proceed.  Care Team   Patient Care Team: Danielle RimaWallace, Danielle Wilhite, DO as PCP - General (Family Medicine) Andrena Mewsigby, Michael D, DO as Consulting Physician (Family Medicine) Lesle Reekhabot, Larry, DDS (Dental General Practice) Ellis SavagePoulos, Lisa, NP as Nurse Practitioner Drema DallasJaffe, Danielle R, DO as Consulting Physician (Neurology)  Subjective:   Danielle MortJoEllen Cox, CMA acting as scribe for Dr. Helane RimaErica Tavon Cox.   HPI: Patient has thrush in mouth. Seen dentist today was told it was due to all medications she is on. Was given mouth wash from that office to clear it up.   Patient has lab orders from [facility] that need to be done when she can come in the office.   Started having body aches and cough a few weeks ago. Symptoms not improved.  Patient does not believe that this is simply due to her fibromyalgia or medications.  She would like COVID testing.  She has done some travel recently to visit her daughter.  Patient Active Problem List   Diagnosis Date Noted  . Obesity (BMI 30.0-34.9) 02/12/2018  . Cervical radiculopathy 12/29/2017  . Lumbar back pain 12/29/2017  . Fibromyalgia 11/21/2017  . COPD (chronic obstructive pulmonary disease) ([facility]) 11/21/2017  . Affective psychosis, bipolar ([facility])   . Major depressive disorder,  recurrent severe without psychotic features ([facility]) 12/13/2016  . Bipolar 1 disorder, depressed, severe ([facility]) 11/20/2016    Class: Chronic  . Overweight (BMI 25.0-29.9) 03/02/2016  . Personality disorder ([facility]) 04/23/2013  . Current every day smoker 02/17/2009  . Essential hypertension 02/17/2009  . Cough 02/17/2009    Social History   Tobacco Use  . Smoking status: Current Every Day Smoker    Packs/day: 1.00    Years: 40.00    Pack years: 40.00    Types: Cigarettes  . Smokeless tobacco: Never Used  Substance Use Topics  . Alcohol use: No    Current Outpatient Medications:  .  atorvastatin (LIPITOR) 20 MG tablet, Take 1 tablet (20 mg total) by mouth daily., Disp: 90 tablet, Rfl: 3 .  azelastine (ASTELIN) 0.1 % nasal spray, Place 2 sprays into both nostrils 2 (two) times daily. Use in each nostril as directed, Disp: 30 mL, Rfl: 12 .  Cholecalciferol (D3 MAXIMUM STRENGTH PO), Take 2,500 mg by mouth daily., Disp: , Rfl:  .  clorazepate (TRANXENE) 7.5 MG tablet, Take 7.5 mg by mouth daily. , Disp: , Rfl:  .  Cyanocobalamin (B-12) 2500 MCG TABS, Take 2,500 mg by mouth daily., Disp: , Rfl:  .  furosemide (LASIX) 20 MG tablet, TAKE ONE TABLET BY MOUTH EVERY MORNING ALONG WITH POTASSIUM, Disp: 90 tablet, Rfl: 1 .  gabapentin (NEURONTIN) 600 MG tablet, 600 mg 3 (three) times daily. , Disp: , Rfl:  .  Ipratropium-Albuterol (COMBIVENT) 20-100 MCG/ACT AERS respimat, Inhale 1 puff into the lungs every 6 (six) hours., Disp: 1 Inhaler, Rfl: 3 .  IRON, FERROUS  SULFATE, PO, Take 65 mg by mouth daily., Disp: , Rfl:  .  meclizine (ANTIVERT) 25 MG tablet, Take 1 tablet (25 mg total) by mouth every 8 (eight) hours as needed for dizziness., Disp: 20 tablet, Rfl: 0 .  modafinil (PROVIGIL) 200 MG tablet, Take 400 mg by mouth daily. , Disp: , Rfl:  .  montelukast (SINGULAIR) 10 MG tablet, Take 1 tablet (10 mg total) by mouth at bedtime., Disp: 30 tablet, Rfl: 3 .  PARoxetine (PAXIL) 40 MG tablet, Take 60 mg  by mouth daily. , Disp: , Rfl:  .  potassium chloride SA (K-DUR) 20 MEQ tablet, One tab daily, Disp: 90 tablet, Rfl: 0 .  risperiDONE (RISPERDAL) 1 MG tablet, Take 2 mg by mouth 2 (two) times daily. , Disp: , Rfl:  .  traZODone (DESYREL) 150 MG tablet, 3 (three) times daily. , Disp: , Rfl:   Allergies  Allergen Reactions  . Moxifloxacin Hcl Palpitations    REACTION: tackycardia  . Penicillins Rash    Has patient had a PCN reaction causing immediate rash, facial/tongue/throat swelling, SOB or lightheadedness with hypotension: Yes Has patient had a PCN reaction causing severe rash involving mucus membranes or skin necrosis: No Has patient had a PCN reaction that required hospitalization: No Has patient had a PCN reaction occurring within the last 10 years: No If all of the above answers are "NO", then may proceed with Cephalosporin use.   . Corticosteroids Other (See Comments)    Mania    Objective:   VITALS: Per patient if applicable, see vitals. GENERAL: Alert, appears well and in no acute distress. HEENT: Atraumatic, conjunctiva clear, no obvious abnormalities on inspection of external nose and ears. NECK: Normal movements of the head and neck. CARDIOPULMONARY: No increased WOB. Speaking in clear sentences. I:E ratio WNL.  MS: Moves all visible extremities without noticeable abnormality. PSYCH: Pleasant and cooperative, well-groomed. Speech normal rate and rhythm. Affect is appropriate. Insight and judgement are appropriate. Attention is focused, linear, and appropriate.  NEURO: CN grossly intact. Oriented as arrived to appointment on time with no prompting. Moves both UE equally.  SKIN: No obvious lesions, wounds, erythema, or cyanosis noted on face or hands.  [facility] 08/13/2018 09/18/2017  Decreased Interest 3 3  Down, Depressed, Hopeless 1 1  PHQ - 2 Score 4 4  Altered sleeping 0 1  Tired, decreased energy 3 3  Change in appetite 2 0  Feeling bad or failure  about yourself  0 1  Trouble concentrating 2 1  Moving slowly or fidgety/restless 0 0  Suicidal thoughts 0 0  PHQ-9 Score 11 10  Difficult doing work/chores Somewhat difficult -  Some encounter information is confidential and restricted. Go to Review Flowsheets activity to see all data.    Assessment and Plan:   [name] was seen today for follow-up.  Diagnoses and all orders for this visit:  Fibromyalgia  Generalized body aches -     Novel Coronavirus, NAA ([facility]) -     Sedimentation rate -     C-reactive protein -     ANA  Exposure to Covid-19 Virus  Chronic obstructive pulmonary disease, unspecified COPD type ([facility])  Bipolar 1 disorder, depressed, severe ([facility])  Current every day smoker  Chronic fatigue -     CBC with Differential/Platelet; Future -     Comprehensive metabolic panel; Future -     TSH; Future -     T4, free; Future  Screening for lipid disorders -  Lipid panel; Future  Fasting hyperglycemia -     Hemoglobin A1c; Future    . COVID-19 Education: The signs and symptoms of COVID-19 were discussed with the patient and how to seek care for testing if needed. The importance of social distancing was discussed today. . Reviewed expectations re: course of current medical issues. . Discussed self-management of symptoms. . Outlined signs and symptoms indicating need for more acute intervention. . Patient verbalized understanding and all questions were answered. Marland Kitchen Health Maintenance issues including appropriate healthy diet, exercise, and smoking avoidance were discussed with patient. . See orders for this visit as documented in the electronic medical record.  Briscoe Deutscher, DO  Records requested if needed. Time spent: 15 minutes, of which >50% was spent in obtaining information about her symptoms, reviewing her previous labs, evaluations, and treatments, counseling her about her condition (please see the discussed topics above), and developing a plan to  further investigate it; she had a number of questions which I addressed.

## 2018-08-14 ENCOUNTER — Other Ambulatory Visit: Payer: Self-pay

## 2018-08-14 DIAGNOSIS — Z20822 Contact with and (suspected) exposure to covid-19: Secondary | ICD-10-CM

## 2018-08-16 LAB — NOVEL CORONAVIRUS, NAA: SARS-CoV-2, NAA: NOT DETECTED

## 2018-08-18 ENCOUNTER — Ambulatory Visit: Payer: Medicare Other | Admitting: Physician Assistant

## 2018-08-18 ENCOUNTER — Telehealth: Payer: Self-pay | Admitting: Family Medicine

## 2018-08-18 NOTE — Telephone Encounter (Signed)
App time moved to tomorrow virtual to see if she needs to come in for labs.

## 2018-08-18 NOTE — Telephone Encounter (Signed)
Patient wants to get scheduled for labs but has body aches (covid symptom) she said that she was negative however, I need approval before scheduling this.   She has a virtual appt monday 8/10 for the aches and knee swelling (refused to be seen by anyone else).   Can she schedule the lab appt for in office?

## 2018-08-19 ENCOUNTER — Encounter: Payer: Medicare Other | Admitting: Family Medicine

## 2018-08-19 ENCOUNTER — Other Ambulatory Visit: Payer: Self-pay

## 2018-08-19 NOTE — Progress Notes (Signed)
ERROR

## 2018-08-20 ENCOUNTER — Other Ambulatory Visit (INDEPENDENT_AMBULATORY_CARE_PROVIDER_SITE_OTHER): Payer: Medicare Other

## 2018-08-20 DIAGNOSIS — Z13220 Encounter for screening for lipoid disorders: Secondary | ICD-10-CM | POA: Diagnosis not present

## 2018-08-20 DIAGNOSIS — R7301 Impaired fasting glucose: Secondary | ICD-10-CM

## 2018-08-20 DIAGNOSIS — R5382 Chronic fatigue, unspecified: Secondary | ICD-10-CM

## 2018-08-20 LAB — CBC WITH DIFFERENTIAL/PLATELET
Basophils Absolute: 0.1 10*3/uL (ref 0.0–0.1)
Basophils Relative: 0.7 % (ref 0.0–3.0)
Eosinophils Absolute: 0.2 10*3/uL (ref 0.0–0.7)
Eosinophils Relative: 2.3 % (ref 0.0–5.0)
HCT: 46.6 % — ABNORMAL HIGH (ref 36.0–46.0)
Hemoglobin: 15.6 g/dL — ABNORMAL HIGH (ref 12.0–15.0)
Lymphocytes Relative: 19.8 % (ref 12.0–46.0)
Lymphs Abs: 2.1 10*3/uL (ref 0.7–4.0)
MCHC: 33.5 g/dL (ref 30.0–36.0)
MCV: 94.7 fl (ref 78.0–100.0)
Monocytes Absolute: 0.7 10*3/uL (ref 0.1–1.0)
Monocytes Relative: 6.5 % (ref 3.0–12.0)
Neutro Abs: 7.6 10*3/uL (ref 1.4–7.7)
Neutrophils Relative %: 70.7 % (ref 43.0–77.0)
Platelets: 248 10*3/uL (ref 150.0–400.0)
RBC: 4.92 Mil/uL (ref 3.87–5.11)
RDW: 13.1 % (ref 11.5–15.5)
WBC: 10.8 10*3/uL — ABNORMAL HIGH (ref 4.0–10.5)

## 2018-08-20 LAB — LIPID PANEL
Cholesterol: 181 mg/dL (ref 0–200)
HDL: 46.4 mg/dL (ref >39.00)
LDL Cholesterol: 104 mg/dL — ABNORMAL HIGH (ref 0–99)
NonHDL: 134.74
Total CHOL/HDL Ratio: 4
Triglycerides: 153 mg/dL — ABNORMAL HIGH (ref 0.0–149.0)
VLDL: 30.6 mg/dL (ref 0.0–40.0)

## 2018-08-20 LAB — COMPREHENSIVE METABOLIC PANEL
ALT: 49 U/L — ABNORMAL HIGH (ref 0–35)
AST: 26 U/L (ref 0–37)
Albumin: 4.3 g/dL (ref 3.5–5.2)
Alkaline Phosphatase: 68 U/L (ref 39–117)
BUN: 17 mg/dL (ref 6–23)
CO2: 28 mEq/L (ref 19–32)
Calcium: 9.7 mg/dL (ref 8.4–10.5)
Chloride: 107 mEq/L (ref 96–112)
Creatinine, Ser: 0.81 mg/dL (ref 0.40–1.20)
GFR: 73.09 mL/min (ref >60.00)
Glucose, Bld: 116 mg/dL — ABNORMAL HIGH (ref 70–99)
Potassium: 4.2 mEq/L (ref 3.5–5.1)
Sodium: 142 mEq/L (ref 135–145)
Total Bilirubin: 0.2 mg/dL (ref 0.2–1.2)
Total Protein: 6.3 g/dL (ref 6.0–8.3)

## 2018-08-20 LAB — TSH: TSH: 0.89 u[IU]/mL (ref 0.35–4.50)

## 2018-08-20 LAB — T4, FREE: Free T4: 0.82 ng/dL (ref 0.60–1.60)

## 2018-08-20 LAB — HEMOGLOBIN A1C: Hgb A1c MFr Bld: 5.7 % (ref 4.6–6.5)

## 2018-08-22 ENCOUNTER — Other Ambulatory Visit: Payer: Self-pay

## 2018-08-22 ENCOUNTER — Ambulatory Visit (INDEPENDENT_AMBULATORY_CARE_PROVIDER_SITE_OTHER): Payer: Medicare Other

## 2018-08-22 ENCOUNTER — Other Ambulatory Visit (INDEPENDENT_AMBULATORY_CARE_PROVIDER_SITE_OTHER): Payer: Medicare Other

## 2018-08-22 ENCOUNTER — Encounter: Payer: Self-pay | Admitting: Family Medicine

## 2018-08-22 ENCOUNTER — Ambulatory Visit (INDEPENDENT_AMBULATORY_CARE_PROVIDER_SITE_OTHER): Payer: Medicare Other | Admitting: Family Medicine

## 2018-08-22 VITALS — Ht 65.0 in | Wt 177.0 lb

## 2018-08-22 DIAGNOSIS — M2550 Pain in unspecified joint: Secondary | ICD-10-CM

## 2018-08-22 DIAGNOSIS — J301 Allergic rhinitis due to pollen: Secondary | ICD-10-CM

## 2018-08-22 DIAGNOSIS — M25561 Pain in right knee: Secondary | ICD-10-CM

## 2018-08-22 DIAGNOSIS — D72829 Elevated white blood cell count, unspecified: Secondary | ICD-10-CM | POA: Diagnosis not present

## 2018-08-22 DIAGNOSIS — M25562 Pain in left knee: Secondary | ICD-10-CM

## 2018-08-22 DIAGNOSIS — G8929 Other chronic pain: Secondary | ICD-10-CM

## 2018-08-22 DIAGNOSIS — M797 Fibromyalgia: Secondary | ICD-10-CM

## 2018-08-22 DIAGNOSIS — F314 Bipolar disorder, current episode depressed, severe, without psychotic features: Secondary | ICD-10-CM

## 2018-08-22 DIAGNOSIS — R05 Cough: Secondary | ICD-10-CM

## 2018-08-22 DIAGNOSIS — D72828 Other elevated white blood cell count: Secondary | ICD-10-CM | POA: Diagnosis not present

## 2018-08-22 DIAGNOSIS — R059 Cough, unspecified: Secondary | ICD-10-CM

## 2018-08-22 DIAGNOSIS — E8881 Metabolic syndrome: Secondary | ICD-10-CM

## 2018-08-22 DIAGNOSIS — F17200 Nicotine dependence, unspecified, uncomplicated: Secondary | ICD-10-CM

## 2018-08-22 DIAGNOSIS — E88819 Insulin resistance, unspecified: Secondary | ICD-10-CM

## 2018-08-22 MED ORDER — AZELASTINE HCL 0.1 % NA SOLN: 2.0000 | Freq: Two times a day (BID) | NASAL | 12 refills | Status: DC

## 2018-08-22 MED ORDER — MONTELUKAST SODIUM 10 MG PO TABS: 10.0000 mg | ORAL_TABLET | Freq: Every day | ORAL | 1 refills | Status: DC

## 2018-08-22 NOTE — Progress Notes (Signed)
Virtual Visit via Video   Due to the COVID-19 pandemic, this visit was completed with telemedicine (audio/video) technology to reduce patient and provider exposure as well as to preserve personal protective equipment.   I connected with Danielle Cox by a video enabled telemedicine application and verified that I am speaking with the correct person using two identifiers. Location patient: Home Location provider: [facility], Office Persons participating in the virtual visit: ERNIE SAGRERO, Briscoe Deutscher, DO Lonell Grandchild, CMA acting as scribe for Dr. Briscoe Deutscher.   I discussed the limitations of evaluation and management by telemedicine and the availability of in person appointments. The patient expressed understanding and agreed to proceed.  Care Team   Patient Care Team: Briscoe Deutscher, DO as PCP - General (Family Medicine) Gerda Diss, DO as Consulting Physician (Family Medicine) Bridgett Larsson, [place] (Dental General Practice) Noemi Chapel, NP as Nurse Practitioner Pieter Partridge, DO as Consulting Physician (Neurology)  Subjective:   HPI: Patient has had increased pain in joints. Including knee, hip, back and shoulders. Right > Left.  She describes the pain as burning or aching.  It is constant and nothing improves it.  She does endorse some swelling and making hard to walk.  Longstanding pain.  She has been seen by sports medicine and PMR.  She has had epidural injections.  Patient is high risk for any type of controlled substance due to previous suicide attempts.  Severe depression, bipolar.  Has diagnosis of fibromyalgia.  Complains of recent rash due to doxycycline use.  Longtime smoker.  Recent COVID test negative.  Results for orders placed or performed in visit on 08/20/18  T4, free  Result Value Ref Range   Free T4 0.82 0.60 - 1.60 ng/dL  TSH  Result Value Ref Range   TSH 0.89 0.35 - 4.50 uIU/mL  Lipid panel  Result Value Ref Range   Cholesterol 181 0 - 200  mg/dL   Triglycerides 153.0 (H) 0.0 - 149.0 mg/dL   HDL 46.40 >39.00 mg/dL   VLDL 30.6 0.0 - 40.0 mg/dL   LDL Cholesterol 104 (H) 0 - 99 mg/dL   Total CHOL/HDL Ratio 4    NonHDL 134.74   Comprehensive metabolic panel  Result Value Ref Range   Sodium 142 135 - 145 mEq/L   Potassium 4.2 3.5 - 5.1 mEq/L   Chloride 107 96 - 112 mEq/L   CO2 28 19 - 32 mEq/L   Glucose, Bld 116 (H) 70 - 99 mg/dL   BUN 17 6 - 23 mg/dL   Creatinine, Ser 0.81 0.40 - 1.20 mg/dL   Total Bilirubin 0.2 0.2 - 1.2 mg/dL   Alkaline Phosphatase 68 39 - 117 U/L   AST 26 0 - 37 U/L   ALT 49 (H) 0 - 35 U/L   Total Protein 6.3 6.0 - 8.3 g/dL   Albumin 4.3 3.5 - 5.2 g/dL   Calcium 9.7 8.4 - 10.5 mg/dL   GFR 73.09 >60.00 mL/min  CBC with Differential/Platelet  Result Value Ref Range   WBC 10.8 (H) 4.0 - 10.5 K/uL   RBC 4.92 3.87 - 5.11 Mil/uL   Hemoglobin 15.6 (H) 12.0 - 15.0 g/dL   HCT 46.6 (H) 36.0 - 46.0 %   MCV 94.7 78.0 - 100.0 fl   MCHC 33.5 30.0 - 36.0 g/dL   RDW 13.1 11.5 - 15.5 %   Platelets 248.0 150.0 - 400.0 K/uL   Neutrophils Relative % 70.7 43.0 - 77.0 %  Lymphocytes Relative 19.8 12.0 - 46.0 %   Monocytes Relative 6.5 3.0 - 12.0 %   Eosinophils Relative 2.3 0.0 - 5.0 %   Basophils Relative 0.7 0.0 - 3.0 %   Neutro Abs 7.6 1.4 - 7.7 K/uL   Lymphs Abs 2.1 0.7 - 4.0 K/uL   Monocytes Absolute 0.7 0.1 - 1.0 K/uL   Eosinophils Absolute 0.2 0.0 - 0.7 K/uL   Basophils Absolute 0.1 0.0 - 0.1 K/uL  Hemoglobin A1c  Result Value Ref Range   Hgb A1c MFr Bld 5.7 4.6 - 6.5 %   Lab Results  Component Value Date   ESRSEDRATE 2 08/22/2018   Lab Results  Component Value Date   CRP 2.7 08/22/2018   Dg Chest 2 View  Result Date: 08/22/2018 CLINICAL DATA:  Seasonal rhinitis EXAM: CHEST - 2 VIEW COMPARISON:  11/20/2017 FINDINGS: Scoliosis of the spine. Coarse interstitial pattern consistent with chronic change, similar compared to prior. Stable cardiomediastinal silhouette. No pneumothorax. IMPRESSION: No  active cardiopulmonary disease. Similar appearance of coarse chronic interstitial lung opacity Electronically Signed   By: Donavan Foil M.D.   On: 08/22/2018 16:16   Dg Knee 1-2 Views Left  Result Date: 08/22/2018 CLINICAL DATA:  Knee pain EXAM: LEFT KNEE - 1-2 VIEW COMPARISON:  None. FINDINGS: No fracture or malalignment. Small knee effusion. Mild degenerative change of the medial joint space compartment IMPRESSION: 1. No acute osseous abnormality 2. Probable small knee effusion.  Minimal degenerative change Electronically Signed   By: Donavan Foil M.D.   On: 08/22/2018 16:17   Dg Knee 1-2 Views Right  Result Date: 08/22/2018 CLINICAL DATA:  Knee pain EXAM: RIGHT KNEE - 1-2 VIEW COMPARISON:  None. FINDINGS: No evidence of fracture, dislocation, or joint effusion. No evidence of arthropathy or other focal bone abnormality. Soft tissues are unremarkable. IMPRESSION: Negative. Electronically Signed   By: Donavan Foil M.D.   On: 08/22/2018 16:17   Review of Systems  Constitutional: Negative for chills and fever.  HENT: Negative for hearing loss and tinnitus.   Eyes: Negative for blurred vision and double vision.  Respiratory: Negative for cough and wheezing.   Cardiovascular: Negative for chest pain, palpitations and leg swelling.  Gastrointestinal: Positive for nausea. Negative for vomiting.  Genitourinary: Negative for dysuria and urgency.  Musculoskeletal: Positive for back pain and joint pain.  Neurological: Negative for dizziness and headaches.  Psychiatric/Behavioral: Positive for depression.    Patient Active Problem List   Diagnosis Date Noted  . Seasonal allergic rhinitis due to pollen 08/24/2018  . Arthralgia of multiple joints 08/24/2018  . Leucocytosis 08/24/2018  . Chronic pain of both knees 08/24/2018  . Insulin resistance 08/24/2018  . Obesity (BMI 30.0-34.9) 02/12/2018  . Cervical radiculopathy 12/29/2017  . Lumbar back pain 12/29/2017  . Fibromyalgia 11/21/2017  . COPD  (chronic obstructive pulmonary disease) ([place]) 11/21/2017  . Affective psychosis, bipolar ([place])   . Major depressive disorder, recurrent severe without psychotic features ([place]) 12/13/2016  . Bipolar 1 disorder, depressed, severe ([place]) 11/20/2016    Class: Chronic  . Overweight (BMI 25.0-29.9) 03/02/2016  . Personality disorder ([place]) 04/23/2013  . Current every day smoker 02/17/2009  . Essential hypertension 02/17/2009  . Cough 02/17/2009    Social History   Tobacco Use  . Smoking status: Current Every Day Smoker    Packs/day: 1.00    Years: 40.00    Pack years: 40.00    Types: Cigarettes  . Smokeless tobacco: Never Used  Substance Use Topics  .  Alcohol use: No    Current Outpatient Medications:  .  atorvastatin (LIPITOR) 20 MG tablet, Take 1 tablet (20 mg total) by mouth daily., Disp: 90 tablet, Rfl: 3 .  azelastine (ASTELIN) 0.1 % nasal spray, Place 2 sprays into both nostrils 2 (two) times daily. Use in each nostril as directed, Disp: 30 mL, Rfl: 12 .  Cholecalciferol (D3 MAXIMUM STRENGTH PO), Take 2,500 mg by mouth daily., Disp: , Rfl:  .  clorazepate (TRANXENE) 7.5 MG tablet, Take 7.5 mg by mouth daily. , Disp: , Rfl:  .  Cyanocobalamin (B-12) 2500 MCG TABS, Take 2,500 mg by mouth daily., Disp: , Rfl:  .  furosemide (LASIX) 20 MG tablet, TAKE ONE TABLET BY MOUTH EVERY MORNING ALONG WITH POTASSIUM, Disp: 90 tablet, Rfl: 1 .  gabapentin (NEURONTIN) 600 MG tablet, 600 mg 3 (three) times daily. , Disp: , Rfl:  .  Ipratropium-Albuterol (COMBIVENT) 20-100 MCG/ACT AERS respimat, Inhale 1 puff into the lungs every 6 (six) hours., Disp: 1 Inhaler, Rfl: 3 .  IRON, FERROUS SULFATE, PO, Take 65 mg by mouth daily., Disp: , Rfl:  .  meclizine (ANTIVERT) 25 MG tablet, Take 1 tablet (25 mg total) by mouth every 8 (eight) hours as needed for dizziness., Disp: 20 tablet, Rfl: 0 .  modafinil (PROVIGIL) 200 MG tablet, Take 400 mg by mouth daily. , Disp: , Rfl:  .  montelukast (SINGULAIR) 10 MG  tablet, Take 1 tablet (10 mg total) by mouth at bedtime., Disp: 90 tablet, Rfl: 1 .  PARoxetine (PAXIL) 40 MG tablet, Take 60 mg by mouth daily. , Disp: , Rfl:  .  potassium chloride SA (K-DUR) 20 MEQ tablet, One tab daily, Disp: 90 tablet, Rfl: 0 .  risperiDONE (RISPERDAL) 1 MG tablet, Take 2 mg by mouth 2 (two) times daily. , Disp: , Rfl:  .  traZODone (DESYREL) 150 MG tablet, 3 (three) times daily. , Disp: , Rfl:  .  ARIPiprazole (ABILIFY) 10 MG tablet, , Disp: , Rfl:   Allergies  Allergen Reactions  . Moxifloxacin Hcl Palpitations    REACTION: tackycardia  . Penicillins Rash    Has patient had a PCN reaction causing immediate rash, facial/tongue/throat swelling, SOB or lightheadedness with hypotension: Yes Has patient had a PCN reaction causing severe rash involving mucus membranes or skin necrosis: No Has patient had a PCN reaction that required hospitalization: No Has patient had a PCN reaction occurring within the last 10 years: No If all of the above answers are "NO", then may proceed with Cephalosporin use.   . Corticosteroids Other (See Comments)    Mania    Objective:   VITALS: Per patient if applicable, see vitals. GENERAL: Alert, appears well and in no acute distress. HEENT: Atraumatic, conjunctiva clear, no obvious abnormalities on inspection of external nose and ears. NECK: Normal movements of the head and neck. CARDIOPULMONARY: No increased WOB. Speaking in clear sentences. I:E ratio WNL.  MS: Moves all visible extremities without noticeable abnormality. PSYCH: Pleasant and cooperative, well-groomed. Speech normal rate and rhythm. Affect is appropriate. Insight and judgement are appropriate. Attention is focused, linear, and appropriate.  NEURO: CN grossly intact. Oriented as arrived to appointment on time with no prompting. Moves both UE equally.  SKIN: No obvious lesions, wounds, erythema, or cyanosis noted on face or hands.  Depression screen [facility] 2/9 08/13/2018  09/18/2017  Decreased Interest 3 3  Down, Depressed, Hopeless 1 1  PHQ - 2 Score 4 4  Altered sleeping 0 1  Tired, decreased energy 3 3  Change in appetite 2 0  Feeling bad or failure about yourself  0 1  Trouble concentrating 2 1  Moving slowly or fidgety/restless 0 0  Suicidal thoughts 0 0  PHQ-9 Score 11 10  Difficult doing work/chores Somewhat difficult -  Some encounter information is confidential and restricted. Go to Review Flowsheets activity to see all data.   Assessment and Plan:   Danielle Cox was seen today for pain.  Diagnoses and all orders for this visit:  Arthralgia of multiple joints Comments: Unfortunately, her ESR, CRP, and ANA labs were not drawn last week.  She will come in for those and x-rays today. Orders: -     Rheumatoid Factor; Future -     Sedimentation rate; Future -     C-reactive protein; Future  Seasonal allergic rhinitis due to pollen Comments: Refill of medications today. Orders: -     azelastine (ASTELIN) 0.1 % nasal spray; Place 2 sprays into both nostrils 2 (two) times daily. Use in each nostril as directed -     montelukast (SINGULAIR) 10 MG tablet; Take 1 tablet (10 mg total) by mouth at bedtime.  Other elevated white blood cell (WBC) count Comments: Improved from last check but still elevated.  Will monitor closely in light of pain complaints.  Chronic pain of both knees Comments: X-rays today. Orders: -     DG Knee 1-2 Views Right; Future -     DG Knee 1-2 Views Left; Future  Fibromyalgia Comments: This is uncontrolled.  We did discuss the medication Savella today.  I have asked her to check in with her psychiatrist about this medication.  Current every day smoker Comments: Smoking cessation reviewed.  Cough Comments: Chest x-ray today without any acute changes.  Reviewed smoking cessation recommendations. Orders: -     DG Chest 2 View; Future  Bipolar 1 disorder, depressed, severe ([place]) Comments: Patient followed by  psychiatry.  Insulin resistance Comments: Will need to be monitored, especially since she is on some antipsychotic medications that can increase this independently of weight.  Marland Kitchen COVID-19 Education: The signs and symptoms of COVID-19 were discussed with the patient and how to seek care for testing if needed. The importance of social distancing was discussed today. . Reviewed expectations re: course of current medical issues. . Discussed self-management of symptoms. . Outlined signs and symptoms indicating need for more acute intervention. . Patient verbalized understanding and all questions were answered. Marland Kitchen Health Maintenance issues including appropriate healthy diet, exercise, and smoking avoidance were discussed with patient. . See orders for this visit as documented in the electronic medical record.  Briscoe Deutscher, DO  Records requested if needed. Time spent: 25 minutes, of which >50% was spent in obtaining information about her symptoms, reviewing her previous labs, evaluations, and treatments, counseling her about her condition (please see the discussed topics above), and developing a plan to further investigate it; she had a number of questions which I addressed.

## 2018-08-24 ENCOUNTER — Encounter: Payer: Self-pay | Admitting: Family Medicine

## 2018-08-24 DIAGNOSIS — E88819 Insulin resistance, unspecified: Secondary | ICD-10-CM | POA: Insufficient documentation

## 2018-08-24 DIAGNOSIS — E8881 Metabolic syndrome: Secondary | ICD-10-CM | POA: Insufficient documentation

## 2018-08-24 DIAGNOSIS — G8929 Other chronic pain: Secondary | ICD-10-CM | POA: Insufficient documentation

## 2018-08-24 DIAGNOSIS — M2550 Pain in unspecified joint: Secondary | ICD-10-CM | POA: Insufficient documentation

## 2018-08-24 DIAGNOSIS — J301 Allergic rhinitis due to pollen: Secondary | ICD-10-CM | POA: Insufficient documentation

## 2018-08-24 DIAGNOSIS — D72829 Elevated white blood cell count, unspecified: Secondary | ICD-10-CM | POA: Insufficient documentation

## 2018-08-24 HISTORY — DX: Insulin resistance, unspecified: E88.819

## 2018-08-25 ENCOUNTER — Ambulatory Visit: Payer: Medicare Other | Admitting: Family Medicine

## 2018-08-25 LAB — ANA: Anti Nuclear Antibody (ANA): NEGATIVE

## 2018-08-25 LAB — SEDIMENTATION RATE: Sed Rate: 2 mm/h (ref 0–30)

## 2018-08-25 LAB — CYCLIC CITRUL PEPTIDE ANTIBODY, IGG: Cyclic Citrullin Peptide Ab: <16 UNITS

## 2018-08-25 LAB — RHEUMATOID FACTOR: Rheumatoid fact SerPl-aCnc: <14 IU/mL (ref <14)

## 2018-08-25 LAB — C-REACTIVE PROTEIN: CRP: 2.7 mg/L (ref <8.0)

## 2018-09-09 ENCOUNTER — Telehealth: Payer: Self-pay | Admitting: Family Medicine

## 2018-09-09 ENCOUNTER — Other Ambulatory Visit: Payer: Self-pay

## 2018-09-09 ENCOUNTER — Encounter: Payer: Self-pay | Admitting: Physician Assistant

## 2018-09-09 ENCOUNTER — Ambulatory Visit (INDEPENDENT_AMBULATORY_CARE_PROVIDER_SITE_OTHER): Payer: Medicare Other | Admitting: Physician Assistant

## 2018-09-09 DIAGNOSIS — M797 Fibromyalgia: Secondary | ICD-10-CM | POA: Diagnosis not present

## 2018-09-09 NOTE — Telephone Encounter (Signed)
Pt called and states that she is having so much pain in both knees. Pt states that she is having pain all over her body. Pt states that it feels piercing and burning. Pt states that she does not know what to do for all this pain. Please advise.

## 2018-09-09 NOTE — Telephone Encounter (Signed)
Please call patient and schedule her an ov for tomorrow.

## 2018-09-09 NOTE — Progress Notes (Signed)
TELEPHONE ENCOUNTER   Patient verbally agreed to telephone visit and is aware that copayment and coinsurance may apply. Patient was treated using telemedicine according to accepted telemedicine protocols.  Location of the patient: home Location of provider: [facility] Names of all persons participating in the telemedicine service and role in the encounter: Jarold MottoSamantha Lanah Steines, [place], Delynn FlavinSheila Kathman  Subjective:   Chief Complaint  Patient presents with  . Joint Pain     HPI   Pt c/o pain all over, worsen the past couple weeks. She is taking Aleve daily, Tylenol 325 mg. No relief. Also taking gabapentin. Pt was started on Savella but had to stop due to side effects -- constipation, hot flashes.  She has been seeing Dr. Earlene PlaterWallace for her fibromyalgia. The recommendation at last visit, after the autoimmune screening labs were found to be normal, was to send to [facility]. Patient opted to try Savella first and then offer Pain Mg[facility].  She continues to have worsening pain and it is affecting her quality of life. She is concerned because she doesn't know what is causing her pain.  Patient Active Problem List   Diagnosis Date Noted  . Seasonal allergic rhinitis due to pollen 08/24/2018  . Arthralgia of multiple joints 08/24/2018  . Leucocytosis 08/24/2018  . Chronic pain of both knees 08/24/2018  . Insulin resistance 08/24/2018  . Obesity (BMI 30.0-34.9) 02/12/2018  . Cervical radiculopathy 12/29/2017  . Lumbar back pain 12/29/2017  . Fibromyalgia 11/21/2017  . COPD (chronic obstructive pulmonary disease) ([facility]) 11/21/2017  . Affective psychosis, bipolar ([facility])   . Major depressive disorder, recurrent severe without psychotic features ([facility]) 12/13/2016  . Bipolar 1 disorder, depressed, severe ([facility]) 11/20/2016    Class: Chronic  . Overweight (BMI 25.0-29.9) 03/02/2016  . Personality disorder ([facility]) 04/23/2013  . Current every day smoker 02/17/2009  . Essential  hypertension 02/17/2009  . Cough 02/17/2009   Social History   Tobacco Use  . Smoking status: Current Every Day Smoker    Packs/day: 1.00    Years: 40.00    Pack years: 40.00    Types: Cigarettes  . Smokeless tobacco: Never Used  Substance Use Topics  . Alcohol use: No    Current Outpatient Medications:  .  ARIPiprazole (ABILIFY) 10 MG tablet, , Disp: , Rfl:  .  atorvastatin (LIPITOR) 20 MG tablet, Take 1 tablet (20 mg total) by mouth daily., Disp: 90 tablet, Rfl: 3 .  azelastine (ASTELIN) 0.1 % nasal spray, Place 2 sprays into both nostrils 2 (two) times daily. Use in each nostril as directed, Disp: 30 mL, Rfl: 12 .  Cholecalciferol (D3 MAXIMUM STRENGTH PO), Take 2,500 mg by mouth daily., Disp: , Rfl:  .  clorazepate (TRANXENE) 7.5 MG tablet, Take 7.5 mg by mouth daily. , Disp: , Rfl:  .  Cyanocobalamin (B-12) 2500 MCG TABS, Take 2,500 mg by mouth daily., Disp: , Rfl:  .  fluvoxaMINE (LUVOX) 50 MG tablet, Take 50 mg by mouth at bedtime., Disp: , Rfl:  .  furosemide (LASIX) 20 MG tablet, TAKE ONE TABLET BY MOUTH EVERY MORNING ALONG WITH POTASSIUM, Disp: 90 tablet, Rfl: 1 .  gabapentin (NEURONTIN) 600 MG tablet, 600 mg 3 (three) times daily. , Disp: , Rfl:  .  Ipratropium-Albuterol (COMBIVENT) 20-100 MCG/ACT AERS respimat, Inhale 1 puff into the lungs every 6 (six) hours., Disp: 1 Inhaler, Rfl: 3 .  IRON, FERROUS SULFATE, PO, Take 65 mg by mouth daily., Disp: , Rfl:  .  meclizine (ANTIVERT) 25 MG tablet, Take 1 tablet (25 mg total) by mouth every 8 (eight) hours as needed for dizziness., Disp: 20 tablet, Rfl: 0 .  modafinil (PROVIGIL) 200 MG tablet, Take 400 mg by mouth daily. , Disp: , Rfl:  .  montelukast (SINGULAIR) 10 MG tablet, Take 1 tablet (10 mg total) by mouth at bedtime., Disp: 90 tablet, Rfl: 1 .  potassium chloride SA (K-DUR) 20 MEQ tablet, One tab daily, Disp: 90 tablet, Rfl: 0 .  risperiDONE (RISPERDAL) 1 MG tablet, Take 2 mg by mouth 2 (two) times daily. , Disp: , Rfl:   .  traZODone (DESYREL) 150 MG tablet, 3 (three) times daily. , Disp: , Rfl:  Allergies  Allergen Reactions  . Moxifloxacin Hcl Palpitations    REACTION: tackycardia  . Penicillins Rash    Has patient had a PCN reaction causing immediate rash, facial/tongue/throat swelling, SOB or lightheadedness with hypotension: Yes Has patient had a PCN reaction causing severe rash involving mucus membranes or skin necrosis: No Has patient had a PCN reaction that required hospitalization: No Has patient had a PCN reaction occurring within the last 10 years: No If all of the above answers are "NO", then may proceed with Cephalosporin use.   . Corticosteroids Other (See Comments)    Mania    Assessment & Plan:   1. Fibromyalgia   Very limited options due to past medication failures, comorbidities, and polypharmacy. She states that her pain is not severe enough to warrant ER evaluation however she did verbalize understanding that she should not delay emergency care due to COVID-19 if pain worsens or changes. Urgent referral to pain management. She was agreeable to this plan and verbalized understanding.   Orders Placed This Encounter  Procedures  . Ambulatory referral to Pain Clinic   No orders of the defined types were placed in this encounter.   Inda Coke, [place] 09/09/2018  Time spent with the patient: 8 minutes, spent in obtaining information about her symptoms, reviewing her previous labs, evaluations, and treatments, counseling her about her condition (please see the discussed topics above), and developing a plan to further investigate it; she had a number of questions which I addressed.

## 2018-09-09 NOTE — Telephone Encounter (Signed)
Admin Staff, Please contact to schedule an appointment for today  Clinical team please be advised.

## 2018-09-09 NOTE — Telephone Encounter (Signed)
Pt is already scheduled for today with Sam

## 2018-09-16 ENCOUNTER — Telehealth: Payer: Self-pay | Admitting: Family Medicine

## 2018-09-16 ENCOUNTER — Telehealth: Payer: Self-pay | Admitting: Physical Therapy

## 2018-09-16 NOTE — Telephone Encounter (Signed)
Copied from [place] [id number]. Topic: General - Other >> Sep 16, 2018  2:35 PM Keene Breath wrote: Reason for CRM: [facility]. Called to inform the office that they think they received a referral in error for the patient.  Please call to discuss.  CB# [phone], Option 5

## 2018-09-16 NOTE — Telephone Encounter (Signed)
Copied from [place] [id number]. Topic: General - Other >> Sep 16, 2018  2:44 PM Pauline Good wrote: Reason for CRM: want to know if referral is suppose to be for [facility] for this pt. Pt called the office and when they look at office notes it stated [facility]. Please call pt verify

## 2018-09-18 ENCOUNTER — Telehealth: Payer: Self-pay | Admitting: Physical Therapy

## 2018-09-18 ENCOUNTER — Other Ambulatory Visit: Payer: Self-pay

## 2018-09-18 NOTE — Telephone Encounter (Signed)
Can you check and see where we are with this referral? Do not see anything in notes

## 2018-09-18 NOTE — Telephone Encounter (Signed)
Copied from [place] [id number]. Topic: General - Other >> Sep 18, 2018  2:03 PM Leward Quan A wrote: Reason for CRM: Patient is requesting some pain medication from Dr Juleen China states that she is in so much pain that she cannot rest and is up at night. She states that she need something to combat the pain till she get into the pain clinic. Ph# [phone]

## 2018-09-18 NOTE — Telephone Encounter (Signed)
Copied from Danielle Cox [id number]. Topic: General - Other >> Sep 18, 2018  2:02 PM Leward Quan A wrote: Patient called in to request an update on the status of the referral for her to the pain management center at [facility]  she would like a call back at Ph# [phone]

## 2018-09-18 NOTE — Telephone Encounter (Signed)
Called patient she does not think this pain is just fibromyalgia she would like to know if there is some where we can send her to have evaluation done with imaging.

## 2018-09-18 NOTE — Telephone Encounter (Signed)
Pain clinics can take up to a week because they review charts.  She can call them back and inform them that [facility] does not have a pain center - im not sure where they got that from - I didn't see anything in the notes.  I can also call and get an update from them tomorrow afternoon.

## 2018-09-18 NOTE — Telephone Encounter (Signed)
Duplicate message. 

## 2018-09-19 NOTE — Telephone Encounter (Signed)
Called and spoke with Danielle Cox at [facility]. She says that issue has been resolved and they have accepted her as a patient and will be reaching out to her to get her scheduled.

## 2018-09-19 NOTE — Telephone Encounter (Signed)
Called and spoke with Betty at Pain Management. She says that issue has been resolved and they have accepted her as a patient and will be reaching out to her to get her scheduled.  

## 2018-09-19 NOTE — Telephone Encounter (Signed)
What is the status of pain management referral?

## 2018-09-20 ENCOUNTER — Other Ambulatory Visit: Payer: Self-pay | Admitting: Family Medicine

## 2018-09-23 NOTE — Telephone Encounter (Signed)
Called patient states she has heard from them. She can not get in until 9/23. Pain is severe she is scheduled to fly out of town and not sure what she needs to do until then?

## 2018-09-24 ENCOUNTER — Telehealth: Payer: Self-pay | Admitting: Family Medicine

## 2018-09-24 ENCOUNTER — Other Ambulatory Visit: Payer: Self-pay | Admitting: Family Medicine

## 2018-09-24 DIAGNOSIS — J301 Allergic rhinitis due to pollen: Secondary | ICD-10-CM

## 2018-09-24 NOTE — Telephone Encounter (Signed)
She leaves tomorrow for [place] - she didn't realize she didn't have any refills left for the Meclizine and Singulair - she would like a refill of those called into the pharmacy today.  She is going to keep her appt for the pain clinic - she would like something called in for the flight / car ride if at all possible to help make her more comfortable.  She would like a phone call back.  

## 2018-09-24 NOTE — Telephone Encounter (Signed)
I'm so sorry, but I cannot think of any medication that she is not already taking. We have already discussed no opioids from me in the past.

## 2018-09-24 NOTE — Telephone Encounter (Signed)
Last fill 05/05/18  #20/0 Last OV 08/13/18

## 2018-09-24 NOTE — Telephone Encounter (Signed)
Called patient she would like to have something for pain sent in.

## 2018-09-24 NOTE — Telephone Encounter (Signed)
Added to another open message  

## 2018-09-24 NOTE — Telephone Encounter (Signed)
Called patient let know if any questions

## 2018-09-24 NOTE — Telephone Encounter (Signed)
Okay refills. Benzos would have to be prescribed by Psych. Can take Dramamine or Meclizine for the flight.

## 2018-09-24 NOTE — Telephone Encounter (Signed)
She leaves tomorrow for [facility] - she didn't realize she didn't have any refills left for the Meclizine and Singulair - she would like a refill of those called into the pharmacy today.  She is going to keep her appt for the pain clinic - she would like something called in for the flight / car ride if at all possible to help make her more comfortable.  She would like a phone call back.

## 2018-09-24 NOTE — Telephone Encounter (Signed)
Please call patient. Her husband has a separate message for similar complaint - pain uncontrolled. We can get them into a pain clinic quickly, right? [place]?

## 2018-09-24 NOTE — Telephone Encounter (Signed)
I called a few locations - most of them are the same week as the appt she has now.  There may be one location that can get her in next week - but she will have to drive to [place] or [place] to those [facility] locations.  I will call and talk to her.

## 2018-10-15 ENCOUNTER — Other Ambulatory Visit: Payer: Self-pay | Admitting: Pain Medicine

## 2018-10-15 ENCOUNTER — Other Ambulatory Visit: Payer: Self-pay

## 2018-10-15 ENCOUNTER — Ambulatory Visit
Admission: RE | Admit: 2018-10-15 | Discharge: 2018-10-15 | Disposition: A | Discharge status: Home / Self Care | Payer: Medicare Other | Source: Ambulatory Visit | Attending: Pain Medicine | Admitting: Pain Medicine

## 2018-10-15 DIAGNOSIS — M25551 Pain in right hip: Secondary | ICD-10-CM

## 2018-10-30 ENCOUNTER — Other Ambulatory Visit: Payer: Self-pay | Admitting: Family Medicine

## 2018-11-17 ENCOUNTER — Ambulatory Visit: Payer: Self-pay

## 2018-11-17 NOTE — Telephone Encounter (Signed)
Patient called with multiple complaints.  She states she has been having headache she rates at 7 stiff neck that run down her spine dizziness and blurred vision. She states she just does not feel well. One of her Drs has just decreased her cymbalta. She is taking multple medications.This is the only medication change.  She states that the vision may be because of her glasses. She denies fever or cold symptoms. Care advice read to patient and she was told to go to ER for care. Patient request call to office. Office notified. I spoke with Sheria Lang.  She will call patient.  Patient notified and will wait the call.  Reason for Disposition . Loss of vision or double vision (Exception: same as prior migraines)  Answer Assessment - Initial Assessment Questions 1. LOCATION: "Where does it hurt?"      Back of head down spine 2. ONSET: "When did the headache start?" (Minutes, hours or days)      Ongoing for a while 3. PATTERN: "Does the pain come and go, or has it been constant since it started?"     Comes and goes 4. SEVERITY: "How bad is the pain?" and "What does it keep you from doing?"  (e.g., Scale 1-10; mild, moderate, or severe)   - MILD (1-3): doesn't interfere with normal activities    - MODERATE (4-7): interferes with normal activities or awakens from sleep    - SEVERE (8-10): excruciating pain, unable to do any normal activities        7 5. RECURRENT SYMPTOM: "Have you ever had headaches before?" If so, ask: "When was the last time?" and "What happened that time?"      no 6. CAUSE: "What do you think is causing the headache?"    cymbalta from spine Dr 7. MIGRAINE: "Have you been diagnosed with migraine headaches?" If so, ask: "Is this headache similar?"     Yes sometimes 8. HEAD INJURY: "Has there been any recent injury to the head?"      no 9. OTHER SYMPTOMS: "Do you have any other symptoms?" (fever, stiff neck, eye pain, sore throat, cold symptoms)     Stiff neck, SOB 10. PREGNANCY: "Is  there any chance you are pregnant?" "When was your last menstrual period?"      N/A no periods  Protocols used: HEADACHE-A-AH

## 2018-11-17 NOTE — Telephone Encounter (Signed)
See note

## 2018-11-17 NOTE — Telephone Encounter (Signed)
Spoke to patient she has had increased and new symptoms. She has had increased weakness and new memory  Issues. She is not able to check blood pressure. She is having some SOB and cough. Has history of smoking. I have advised  that she be seen in ED for new onset of extreme headache, with vision changes, increased fatigue with new onset of memory issues. She will have son take her when he gets home. If increased symptoms or new ones before then she will call 911.

## 2018-11-17 NOTE — Telephone Encounter (Signed)
Excellent advise thanks

## 2018-11-27 ENCOUNTER — Other Ambulatory Visit: Payer: Self-pay | Admitting: Family Medicine

## 2018-11-30 ENCOUNTER — Other Ambulatory Visit: Payer: Self-pay | Admitting: Family Medicine

## 2018-11-30 DIAGNOSIS — J441 Chronic obstructive pulmonary disease with (acute) exacerbation: Secondary | ICD-10-CM

## 2018-11-30 DIAGNOSIS — J301 Allergic rhinitis due to pollen: Secondary | ICD-10-CM

## 2018-12-05 ENCOUNTER — Other Ambulatory Visit: Payer: Self-pay | Admitting: Family Medicine

## 2018-12-09 NOTE — Telephone Encounter (Signed)
Patient is scheduled for TOC to Dr. Jerline Pain on 02/06/19. Thank you!

## 2018-12-09 NOTE — Telephone Encounter (Signed)
Please call patient to make [facility] app and let me know when made so I can send for refill.

## 2018-12-22 ENCOUNTER — Other Ambulatory Visit: Payer: Self-pay | Admitting: Pain Medicine

## 2018-12-22 ENCOUNTER — Ambulatory Visit
Admission: RE | Admit: 2018-12-22 | Discharge: 2018-12-22 | Disposition: A | Discharge status: Home / Self Care | Payer: Medicare Other | Source: Ambulatory Visit | Attending: Pain Medicine | Admitting: Pain Medicine

## 2018-12-22 DIAGNOSIS — M25561 Pain in right knee: Secondary | ICD-10-CM

## 2018-12-22 DIAGNOSIS — M25562 Pain in left knee: Secondary | ICD-10-CM

## 2018-12-28 IMAGING — MR MR HEAD W/O CM
10 series · 48 of 48 positions shown · non-contrast
Comparison: None.

CLINICAL DATA: Chronic headache with normal neuro exam

EXAM:
MRI HEAD WITHOUT CONTRAST
TECHNIQUE: Multiplanar, multiecho pulse sequences of the brain and surrounding
structures were obtained without intravenous contrast.

[Series 5: T1 · sagittal · 4.0mm · 0.94mm/px · 4 of 31 slices shown (1 of 2)]
[im 1/31]
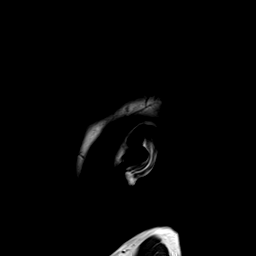
[im 11/31]
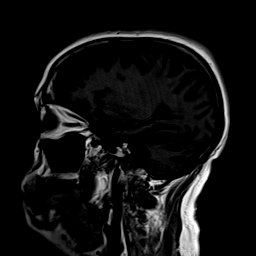
[im 21/31]
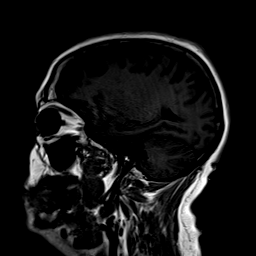
[im 31/31]
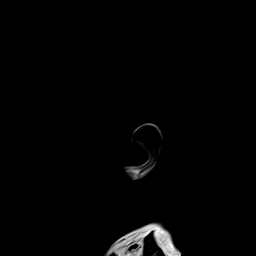

[Series 6: DWI · axial · 3.0mm · 1.44mm/px · z∈[-61,+84]mm · 7 of 90 slices shown (1 of 4)]
[im 1/90]
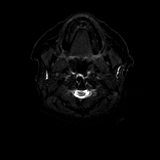
[im 15/90]
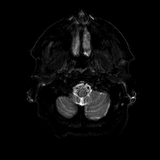
[im 30/90]
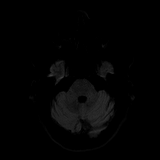
[im 45/90]
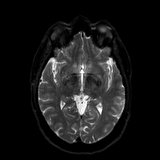
[im 60/90]
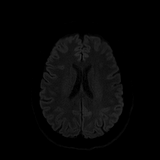
[im 75/90]
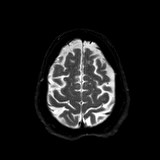
[im 90/90]
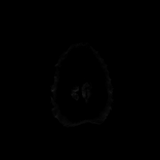

[Series 7: DWI · axial · 3.0mm · 1.44mm/px · z∈[-61,+84]mm · 4 of 43 slices shown (2 of 4)]
[im 1/43]
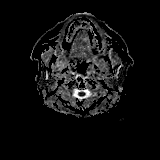
[im 15/43]
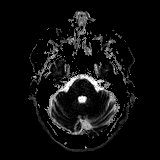
[im 29/43]
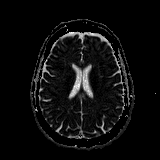
[im 43/43]
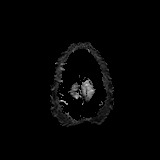

[Series 8: DWI · coronal · 5.0mm · 1.44mm/px · 5 of 60 slices shown (3 of 4)]
[im 1/60]
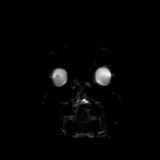
[im 15/60]
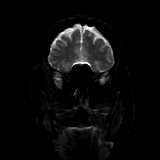
[im 30/60]
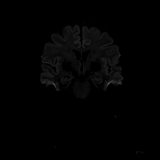
[im 45/60]
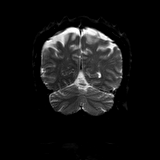
[im 60/60]
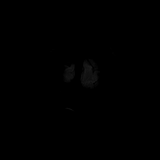

[Series 9: DWI · coronal · 5.0mm · 1.44mm/px · 2 of 30 slices shown (4 of 4)]
[im 1/30]
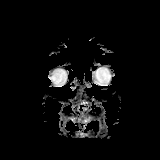
[im 30/30]
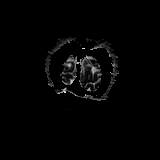

[Series 10: T2 · axial · 4.0mm · 0.45mm/px · z∈[-58,+87]mm · 2 of 29 slices shown (1 of 2)]
[im 1/29]
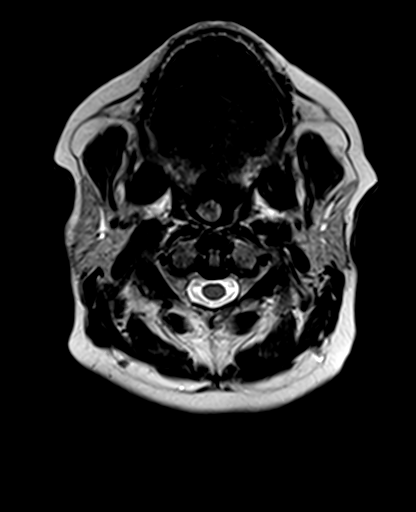
[im 29/29]
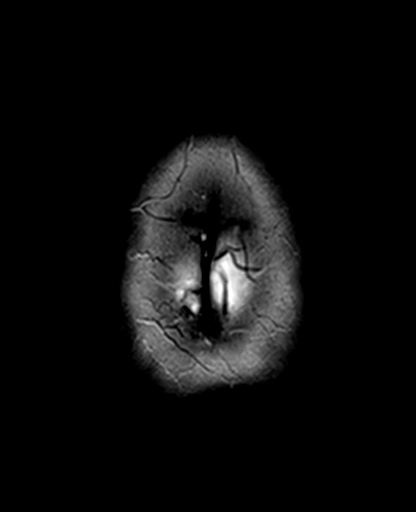

[Series 11: FLAIR · axial · 3.0mm · 0.72mm/px · z∈[-61,+89]mm · 2 of 26 slices shown]
[im 1/26]
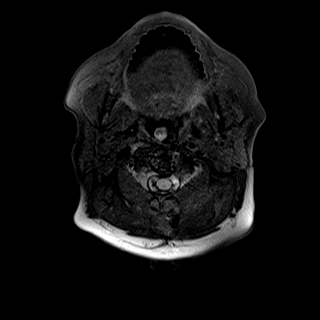
[im 26/26]
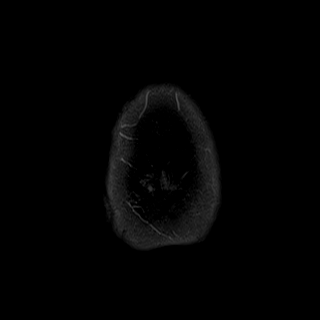

[Series 13: swi_images · axial · 1.5mm · 0.90mm/px · z∈[-57,+85]mm · 8 of 96 slices shown]
[im 1/96]
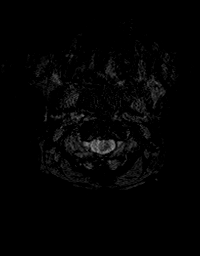
[im 14/96]
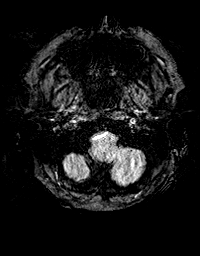
[im 28/96]
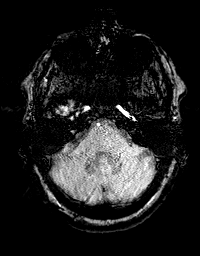
[im 41/96]
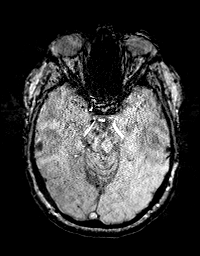
[im 55/96]
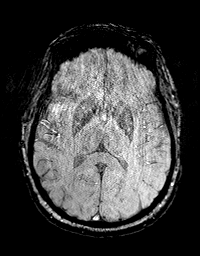
[im 68/96]
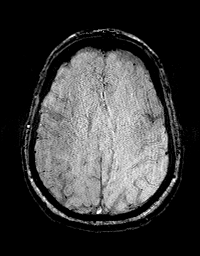
[im 82/96]
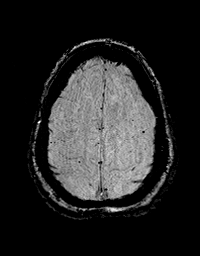
[im 96/96]
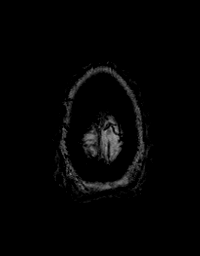

[Series 14: T1 · axial · 1.0mm · 0.90mm/px · z∈[-57,+85]mm · 12 of 144 slices shown (2 of 2)]
[im 1/144]
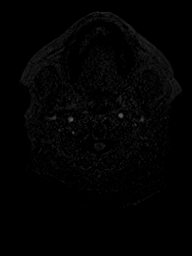
[im 14/144]
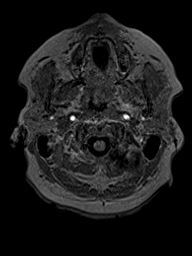
[im 27/144]
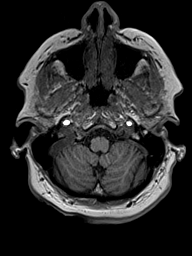
[im 40/144]
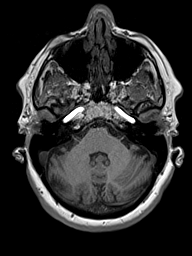
[im 53/144]
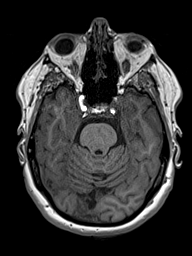
[im 66/144]
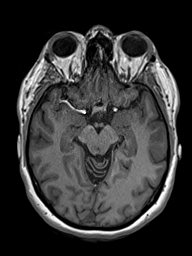
[im 79/144]
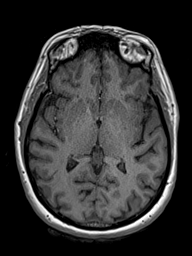
[im 92/144]
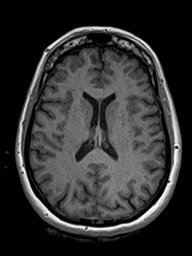
[im 105/144]
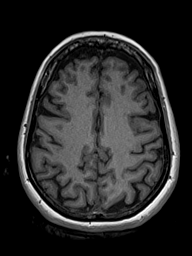
[im 118/144]
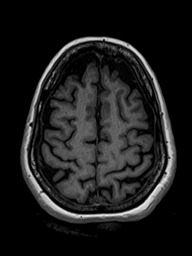
[im 131/144]
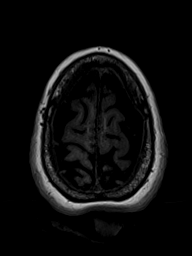
[im 144/144]
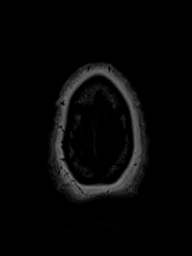

[Series 15: T2 · coronal · 4.5mm · 0.45mm/px · 2 of 30 slices shown (2 of 2)]
[im 1/30]
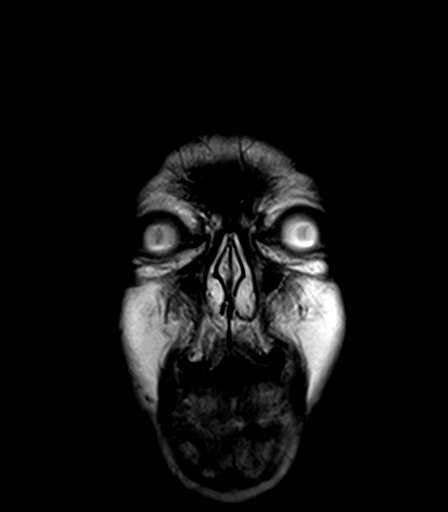
[im 30/30]
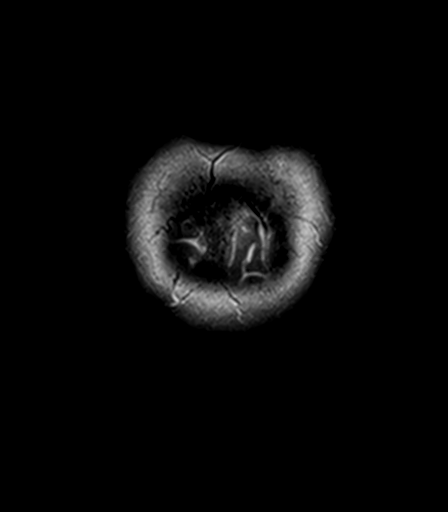

[48 of 48 positions shown; findings below may reference images not displayed]

FINDINGS: Brain: No acute infarction, hemorrhage, hydrocephalus, extra-axial
collection or mass lesion. Few small FLAIR hyperintensities in the
cerebral white matter attributed to old insult, usually considered
incidental when seen to this minor degree. There is no specific
demyelinating pattern. Normal brain volume.

Vascular: Major flow voids are preserved

Skull and upper cervical spine: No focal marrow lesion. Disc
narrowing and facet spurring as seen on recent cervical MRI.

Sinuses/Orbits: Negative.  No sinusitis.
IMPRESSION: Negative exam.  No explanation for headache.

## 2019-01-08 ENCOUNTER — Other Ambulatory Visit: Payer: Self-pay | Admitting: Family Medicine

## 2019-01-22 ENCOUNTER — Other Ambulatory Visit: Payer: Self-pay

## 2019-01-22 ENCOUNTER — Ambulatory Visit (INDEPENDENT_AMBULATORY_CARE_PROVIDER_SITE_OTHER): Payer: Medicare PPO

## 2019-01-22 VITALS — BP 114/78

## 2019-01-22 DIAGNOSIS — Z0000 Encounter for general adult medical examination without abnormal findings: Secondary | ICD-10-CM

## 2019-01-22 NOTE — Progress Notes (Signed)
This visit is being conducted via phone call due to the COVID-19 pandemic. This patient has given me verbal consent via phone to conduct this visit, patient states they are participating from their home address. Some vital signs may be absent or patient reported.   Patient identification: identified by name, DOB, and current address.  Location provider:  [facility], Office Persons participating in the virtual visit: Kandis Fantasia LPN, patient, and Dr. Jacquiline Doe     Subjective:   Danielle Cox is a 59 y.o. female who presents for an Initial Medicare Annual Wellness Visit.  Review of Systems     Cardiac Risk Factors include: hypertension;smoking/ tobacco exposure    Objective:    Today's Vitals   01/22/19 0913  BP: 114/78   There is no height or weight on file to calculate BMI.  Advanced Directives 01/22/2019 12/13/2016 12/12/2016  Does Patient Have a Medical Advance Directive? No No No  Would patient like information on creating a medical advance directive? Yes (MAU/Ambulatory/Procedural Areas - Information given) No - Patient declined No - Patient declined  Some encounter information is confidential and restricted. Go to Review Flowsheets activity to see all data.    Current Medications (verified) Outpatient Encounter Medications as of 01/22/2019  Medication Sig  . ARIPiprazole (ABILIFY) 10 MG tablet   . atorvastatin (LIPITOR) 20 MG tablet Take 1 tablet (20 mg total) by mouth daily.  Marland Kitchen azelastine (ASTELIN) 0.1 % nasal spray Place 2 sprays into both nostrils 2 (two) times daily. Use in each nostril as directed  . Buprenorphine HCl 300 MCG FILM Place 1 patch inside cheek every 12 (twelve) hours.  . Cholecalciferol (D3 MAXIMUM STRENGTH PO) Take 2,500 mg by mouth daily.  . clorazepate (TRANXENE) 7.5 MG tablet Take 7.5 mg by mouth daily.   . COMBIVENT RESPIMAT 20-100 MCG/ACT AERS respimat INHALE ONE INHALATION BY MOUTH EVERY 6 HOURS  . Cyanocobalamin (B-12) 2500 MCG TABS Take  2,500 mg by mouth daily.  . DULoxetine (CYMBALTA) 30 MG capsule Take 3 capsules by mouth daily.  . fluvoxaMINE (LUVOX) 50 MG tablet Take 50 mg by mouth at bedtime.  . furosemide (LASIX) 20 MG tablet TAKE ONE TABLET BY MOUTH EVERY MORNING ALONG WITH POTASSIUM  . gabapentin (NEURONTIN) 600 MG tablet 600 mg 3 (three) times daily.   . IRON, FERROUS SULFATE, PO Take 65 mg by mouth daily.  . meclizine (ANTIVERT) 25 MG tablet TAKE ONE TABLET BY MOUTH EVERY 8 HOURS AS NEEDED FOR DIZZINESS  . modafinil (PROVIGIL) 200 MG tablet Take 400 mg by mouth daily.   . montelukast (SINGULAIR) 10 MG tablet TAKE ONE TABLET BY MOUTH EVERY NIGHT AT BEDTIME  . nystatin (MYCOSTATIN) 100000 UNIT/ML suspension   . potassium chloride SA (KLOR-CON M20) 20 MEQ tablet TAKE ONE TABLET BY MOUTH EVERY MORNING  . risperiDONE (RISPERDAL) 1 MG tablet Take 2 mg by mouth 2 (two) times daily.   Marland Kitchen rOPINIRole (REQUIP) 1 MG tablet   . traZODone (DESYREL) 150 MG tablet 3 (three) times daily.    No facility-administered encounter medications on file as of 01/22/2019.    Allergies (verified) Moxifloxacin hcl, Penicillins, and Corticosteroids   History: Past Medical History:  Diagnosis Date  . Anxiety   . Bipolar disorder (HCC)   . Bronchitis, chronic (HCC)   . COPD (chronic obstructive pulmonary disease) (HCC)   . Depression   . Personality disorder [facility])    Past Surgical History:  Procedure Laterality Date  . CARPAL TUNNEL RELEASE Right   .  CESAREAN SECTION  1983, 1988, 1992  . TONSILLECTOMY  age 49   Family History  Problem Relation Age of Onset  . Depression Mother   . Alcohol abuse Mother   . Depression Father   . Alcohol abuse Father   . Alcohol abuse Sister   . Depression Sister   . Alcohol abuse Brother   . Depression Brother        overdose  . Breast cancer Neg Hx    Social History   Socioeconomic History  . Marital status: Married    Spouse name: Onalee Hua  . Number of children: 3  . Years of education:  Not on file  . Highest education level: GED or equivalent  Occupational History  . Occupation: disabled  Tobacco Use  . Smoking status: Current Every Day Smoker    Packs/day: 1.00    Years: 40.00    Pack years: 40.00    Types: Cigarettes  . Smokeless tobacco: Never Used  Substance and Sexual Activity  . Alcohol use: No  . Drug use: No  . Sexual activity: Not Currently    Partners: Male    Birth control/protection: None  Other Topics Concern  . Not on file  Social History Narrative   10/29/2012 [name] Ramonia was born and grew up in [place], [place]. She has 2 brothers and one sister. She reports that her childhood was "poor," and explains that she needs both financially and emotionally. She completed the 10th grade, then achieved her GED. She has been married twice. The first time at age 52, and that ended after 1 year. She is currently married to her second husband of 27 years. She has 2 sons and one daughter. She is currently unemployed and on disability for 2 years. She lives with her husband, her daughter, and one of her daughter's friends. She denies any legal difficulties. She reports that she is spiritual but not religious. Her hobbies include reading and cross stitch. She reports that she has no social support network. 10/29/2012 [name]      Patient is right-handed.she lives with her husband ina one story home with a few steps to enter. She drinks 2 cups of tea a day. She is active around the home.   Social Determinants of Health   Financial Resource Strain:   . Difficulty of Paying Living Expenses: Not on file  Food Insecurity:   . Worried About Programme researcher, broadcasting/film/video in the Last Year: Not on file  . Ran Out of Food in the Last Year: Not on file  Transportation Needs:   . Lack of Transportation (Medical): Not on file  . Lack of Transportation (Non-Medical): Not on file  Physical Activity:   . Days of Exercise per Week: Not on file  . Minutes of Exercise per Session: Not on file    Stress:   . Feeling of Stress : Not on file  Social Connections:   . Frequency of Communication with Friends and Family: Not on file  . Frequency of Social Gatherings with Friends and Family: Not on file  . Attends Religious Services: Not on file  . Active Member of Clubs or Organizations: Not on file  . Attends Banker Meetings: Not on file  . Marital Status: Not on file    Tobacco Counseling Ready to quit: Not Answered Counseling given: Not Answered   Clinical Intake:  Pre-visit preparation completed: Yes  Pain : 0-10 Pain Type: Chronic pain Pain Location: Knee Pain Onset: More than  a month ago Pain Frequency: Constant  Diabetes: No  How often do you need to have someone help you when you read instructions, pamphlets, or other written materials from your doctor or pharmacy?: 1 - Never  Interpreter Needed?: No  Information entered by :: Denman George LPN   Activities of Daily Living In your present state of health, do you have any difficulty performing the following activities: 01/22/2019 08/13/2018  Hearing? N N  Vision? N N  Difficulty concentrating or making decisions? N N  Walking or climbing stairs? N N  Dressing or bathing? N N  Doing errands, shopping? N N  Preparing Food and eating ? N -  Using the Toilet? N -  In the past six months, have you accidently leaked urine? N -  Do you have problems with loss of bowel control? N -  Managing your Medications? N -  Managing your Finances? N -  Housekeeping or managing your Housekeeping? N -  Some recent data might be hidden     Immunizations and Health Maintenance  There is no immunization history on file for this patient. Health Maintenance Due  Topic Date Due  . TETANUS/TDAP  06/09/1981  . PAP SMEAR-Modifier  03/03/2019    Patient Care Team: Briscoe Deutscher, DO as PCP - General (Family Medicine) Gerda Diss, DO as Consulting Physician (Family Medicine) Bridgett Larsson, [place] (Dental  General Practice) Noemi Chapel, NP as Nurse Practitioner Pieter Partridge, DO as Consulting Physician (Neurology) Care, [facility] (Pain Medicine) Norma Fredrickson, MD as Consulting Physician (Psychiatry) Dian Situ, MD as Consulting Physician (Pain Medicine)  Indicate any recent Medical Services you may have received from other than [facility] providers in the past year (date may be approximate).     Assessment:   This is a routine wellness examination for Chesni.  Hearing/Vision screen No exam data present  Dietary issues and exercise activities discussed: Current Exercise Habits: The patient does not participate in regular exercise at present  Goals   None    Depression Screen PHQ 2/9 Scores 01/22/2019 08/13/2018 09/18/2017 03/02/2016  PHQ - 2 Score - 4 4 -  PHQ- 9 Score - 11 10 -  Exception Documentation (No Data) - - Other- indicate reason in comment box  Not completed - - - FOLLOWED BY [facility] FOR DEPRESSION, SEVERE   Some encounter information is confidential and restricted. Go to Review Flowsheets activity to see all data.    Fall Risk Fall Risk  01/22/2019 08/22/2018 08/13/2018 02/21/2018  Falls in the past year? 0 0 0 1  Number falls in past yr: - 0 0 1  Injury with Fall? 0 0 0 0  Follow up Education provided;Falls prevention discussed;Falls evaluation completed - - Falls evaluation completed    Is the patient's home free of loose throw rugs in walkways, pet beds, electrical cords, etc?   yes      Grab bars in the bathroom? yes      Handrails on the stairs?   yes      Adequate lighting?   yes    Cognitive Function: no cognitive concerns at this time    Screening Tests Health Maintenance  Topic Date Due  . TETANUS/TDAP  06/09/1981  . PAP SMEAR-Modifier  03/03/2019  . INFLUENZA VACCINE  02/18/2024 (Originally 08/16/2018)  . Fecal DNA (Cologuard)  12/02/2020  . Hepatitis C Screening  Completed  . HIV Screening  Completed  . MAMMOGRAM  Discontinued     Qualifies  for Shingles Vaccine? Discussed and patient will check with pharmacy for coverage.  Patient education handout provided   Cancer Screenings: Lung: Low Dose CT Chest recommended if Age 29-80 years, 30 pack-year currently smoking OR have quit w/in 15years. Patient does not qualify. Breast: Up to date on Mammogram? Yes   Up to date of Bone Density/Dexa? Yes Colorectal: Cologuard 12/02/17 normal     Plan:  I have personally reviewed and addressed the Medicare Annual Wellness questionnaire and have noted the following in the patient's chart:  A. Medical and social history B. Use of alcohol, tobacco or illicit drugs  C. Current medications and supplements D. Functional ability and status E.  Nutritional status F.  Physical activity G. Advance directives H. List of other physicians I.  Hospitalizations, surgeries, and ER visits in previous 12 months J.  Vitals K. Screenings such as hearing and vision if needed, cognitive and depression L. Referrals, records requested, and appointments- none   In addition, I have reviewed and discussed with patient certain preventive protocols, quality metrics, and best practice recommendations. A written personalized care plan for preventive services as well as general preventive health recommendations were provided to patient.   Signed,  Kandis Fantasia, LPN  Nurse Health Advisor   Nurse Notes: no additional

## 2019-01-22 NOTE — Patient Instructions (Signed)
Danielle Cox , Thank you for taking time to come for your Medicare Wellness Visit. I appreciate your ongoing commitment to your health goals. Please review the following plan we discussed and let me know if I can assist you in the future.   Screening recommendations/referrals: Colorectal Screening: up to date; Cologuard normal 12/02/17 Mammogram: up to date; last 01/09/18 Bone Density: starting @ age 57  Vision and Dental Exams: Recommended annual ophthalmology exams for early detection of glaucoma and other disorders of the eye Recommended annual dental exams for proper oral hygiene  Vaccinations: Influenza vaccine:  recommended this fall either at PCP office or through your local pharmacy  Pneumococcal vaccine: not indicated Tdap vaccine: recommended; Please call your insurance company to determine your out of pocket expense. You may receive this vaccine at your local pharmacy or Health Dept. Shingles vaccine: Please call your insurance company to determine your out of pocket expense for the Shingrix vaccine. You may receive this vaccine at your local pharmacy.  Advanced directives:  I have provided a copy for you to complete at home and have notarized. Once this is complete please bring a copy in to our office so we can scan it into your chart.  Goals: Recommend to drink at least 6-8 8oz glasses of water per day and consume a balanced diet rich in fresh fruits and vegetables.   Next appointment: Please schedule your Annual Wellness Visit with your Nurse Health Advisor in one year.  Preventive Care 40-64 Years, Female Preventive care refers to lifestyle choices and visits with your health care provider that can promote health and wellness. What does preventive care include?  A yearly physical exam. This is also called an annual well check.  Dental exams once or twice a year.  Routine eye exams. Ask your health care provider how often you should have your eyes checked.  Personal  lifestyle choices, including:  Daily care of your teeth and gums.  Regular physical activity.  Eating a healthy diet.  Avoiding tobacco and drug use.  Limiting alcohol use.  Practicing safe sex.  Taking low-dose aspirin daily starting at age 66 if recommended by your health care provider.  Taking vitamin and mineral supplements as recommended by your health care provider. What happens during an annual well check? The services and screenings done by your health care provider during your annual well check will depend on your age, overall health, lifestyle risk factors, and family history of disease. Counseling  Your health care provider may ask you questions about your:  Alcohol use.  Tobacco use.  Drug use.  Emotional well-being.  Home and relationship well-being.  Sexual activity.  Eating habits.  Work and work Statistician.  Method of birth control.  Menstrual cycle.  Pregnancy history. Screening  You may have the following tests or measurements:  Height, weight, and BMI.  Blood pressure.  Lipid and cholesterol levels. These may be checked every 5 years, or more frequently if you are over 73 years old.  Skin check.  Lung cancer screening. You may have this screening every year starting at age 63 if you have a 30-pack-year history of smoking and currently smoke or have quit within the past 15 years.  Fecal occult blood test (FOBT) of the stool. You may have this test every year starting at age 19.  Flexible sigmoidoscopy or colonoscopy. You may have a sigmoidoscopy every 5 years or a colonoscopy every 10 years starting at age 50.  Hepatitis C blood test.  Hepatitis B blood test.  Sexually transmitted disease (STD) testing.  Diabetes screening. This is done by checking your blood sugar (glucose) after you have not eaten for a while (fasting). You may have this done every 1-3 years.  Mammogram. This may be done every 1-2 years. Talk to your health care  provider about when you should start having regular mammograms. This may depend on whether you have a family history of breast cancer.  BRCA-related cancer screening. This may be done if you have a family history of breast, ovarian, tubal, or peritoneal cancers.  Pelvic exam and Pap test. This may be done every 3 years starting at age 73. Starting at age 70, this may be done every 5 years if you have a Pap test in combination with an HPV test.  Bone density scan. This is done to screen for osteoporosis. You may have this scan if you are at high risk for osteoporosis. Discuss your test results, treatment options, and if necessary, the need for more tests with your health care provider. Vaccines  Your health care provider may recommend certain vaccines, such as:  Influenza vaccine. This is recommended every year.  Tetanus, diphtheria, and acellular pertussis (Tdap, Td) vaccine. You may need a Td booster every 10 years.  Zoster vaccine. You may need this after age 101.  Pneumococcal 13-valent conjugate (PCV13) vaccine. You may need this if you have certain conditions and were not previously vaccinated.  Pneumococcal polysaccharide (PPSV23) vaccine. You may need one or two doses if you smoke cigarettes or if you have certain conditions. Talk to your health care provider about which screenings and vaccines you need and how often you need them. This information is not intended to replace advice given to you by your health care provider. Make sure you discuss any questions you have with your health care provider. Document Released: 01/28/2015 Document Revised: 09/21/2015 Document Reviewed: 11/02/2014 Elsevier Interactive Patient Education  2017 [place] Prevention in the Home Falls can cause injuries. They can happen to people of all ages. There are many things you can do to make your home safe and to help prevent falls. What can I do on the outside of my home?  Regularly fix the  edges of walkways and driveways and fix any cracks.  Remove anything that might make you trip as you walk through a door, such as a raised step or threshold.  Trim any bushes or trees on the path to your home.  Use bright outdoor lighting.  Clear any walking paths of anything that might make someone trip, such as rocks or tools.  Regularly check to see if handrails are loose or broken. Make sure that both sides of any steps have handrails.  Any raised decks and porches should have guardrails on the edges.  Have any leaves, snow, or ice cleared regularly.  Use sand or salt on walking paths during winter.  Clean up any spills in your garage right away. This includes oil or grease spills. What can I do in the bathroom?  Use night lights.  Install grab bars by the toilet and in the tub and shower. Do not use towel bars as grab bars.  Use non-skid mats or decals in the tub or shower.  If you need to sit down in the shower, use a plastic, non-slip stool.  Keep the floor dry. Clean up any water that spills on the floor as soon as it happens.  Remove soap buildup  in the tub or shower regularly.  Attach bath mats securely with double-sided non-slip rug tape.  Do not have throw rugs and other things on the floor that can make you trip. What can I do in the bedroom?  Use night lights.  Make sure that you have a light by your bed that is easy to reach.  Do not use any sheets or blankets that are too big for your bed. They should not hang down onto the floor.  Have a firm chair that has side arms. You can use this for support while you get dressed.  Do not have throw rugs and other things on the floor that can make you trip. What can I do in the kitchen?  Clean up any spills right away.  Avoid walking on wet floors.  Keep items that you use a lot in easy-to-reach places.  If you need to reach something above you, use a strong step stool that has a grab bar.  Keep  electrical cords out of the way.  Do not use floor polish or wax that makes floors slippery. If you must use wax, use non-skid floor wax.  Do not have throw rugs and other things on the floor that can make you trip. What can I do with my stairs?  Do not leave any items on the stairs.  Make sure that there are handrails on both sides of the stairs and use them. Fix handrails that are broken or loose. Make sure that handrails are as long as the stairways.  Check any carpeting to make sure that it is firmly attached to the stairs. Fix any carpet that is loose or worn.  Avoid having throw rugs at the top or bottom of the stairs. If you do have throw rugs, attach them to the floor with carpet tape.  Make sure that you have a light switch at the top of the stairs and the bottom of the stairs. If you do not have them, ask someone to add them for you. What else can I do to help prevent falls?  Wear shoes that:  Do not have high heels.  Have rubber bottoms.  Are comfortable and fit you well.  Are closed at the toe. Do not wear sandals.  If you use a stepladder:  Make sure that it is fully opened. Do not climb a closed stepladder.  Make sure that both sides of the stepladder are locked into place.  Ask someone to hold it for you, if possible.  Clearly mark and make sure that you can see:  Any grab bars or handrails.  First and last steps.  Where the edge of each step is.  Use tools that help you move around (mobility aids) if they are needed. These include:  Canes.  Walkers.  Scooters.  Crutches.  Turn on the lights when you go into a dark area. Replace any light bulbs as soon as they burn out.  Set up your furniture so you have a clear path. Avoid moving your furniture around.  If any of your floors are uneven, fix them.  If there are any pets around you, be aware of where they are.  Review your medicines with your doctor. Some medicines can make you feel dizzy.  This can increase your chance of falling. Ask your doctor what other things that you can do to help prevent falls. This information is not intended to replace advice given to you by your health care provider. Make  sure you discuss any questions you have with your health care provider. Document Released: 10/28/2008 Document Revised: 06/09/2015 Document Reviewed: 02/05/2014 Elsevier Interactive Patient Education  2017 [employer].

## 2019-01-25 ENCOUNTER — Other Ambulatory Visit: Payer: Self-pay | Admitting: Family Medicine

## 2019-01-28 DIAGNOSIS — M2351 Chronic instability of knee, right knee: Secondary | ICD-10-CM | POA: Diagnosis not present

## 2019-01-28 DIAGNOSIS — M2352 Chronic instability of knee, left knee: Secondary | ICD-10-CM | POA: Diagnosis not present

## 2019-01-28 DIAGNOSIS — M170 Bilateral primary osteoarthritis of knee: Secondary | ICD-10-CM | POA: Diagnosis not present

## 2019-01-29 NOTE — Telephone Encounter (Signed)
Last OV 09/09/18 with Jarold Motto Last refill 10/31/18 #90/0 Next OV 02/06/19 with Dr. [name]

## 2019-02-05 ENCOUNTER — Other Ambulatory Visit: Payer: Self-pay | Admitting: Family Medicine

## 2019-02-06 ENCOUNTER — Encounter: Payer: Medicare Other | Admitting: Family Medicine

## 2019-02-08 ENCOUNTER — Other Ambulatory Visit: Payer: Self-pay | Admitting: Family Medicine

## 2019-02-09 ENCOUNTER — Telehealth: Payer: Self-pay | Admitting: Family Medicine

## 2019-02-09 NOTE — Telephone Encounter (Signed)
ERROR

## 2019-02-09 NOTE — Telephone Encounter (Signed)
Please review

## 2019-02-10 ENCOUNTER — Ambulatory Visit (INDEPENDENT_AMBULATORY_CARE_PROVIDER_SITE_OTHER): Payer: Medicare PPO | Admitting: Family Medicine

## 2019-02-10 DIAGNOSIS — J449 Chronic obstructive pulmonary disease, unspecified: Secondary | ICD-10-CM

## 2019-02-10 DIAGNOSIS — M797 Fibromyalgia: Secondary | ICD-10-CM | POA: Diagnosis not present

## 2019-02-10 MED ORDER — DULERA 100-5 MCG/ACT IN AERO: 2.0000 | INHALATION_SPRAY | Freq: Every day | RESPIRATORY_TRACT | 5 refills | Status: DC

## 2019-02-10 NOTE — Assessment & Plan Note (Signed)
Stable.  Will send in [facility] instead of Combivent.  Continue Singulair 10 mg daily.

## 2019-02-10 NOTE — Assessment & Plan Note (Signed)
Pain is stable but uncontrolled.. Continue Cymbalta and gabapentin.  She will follow-up with her spine specialist.  May need referral to rheumatology if continues to be uncontrolled.

## 2019-02-10 NOTE — Progress Notes (Signed)
   Danielle Cox is a 59 y.o. female who presents today for a virtual office visit.  Assessment/Plan:  Chronic Problems Addressed Today: COPD (chronic obstructive pulmonary disease) (HCC) Stable.  Will send in [facility] instead of Combivent.  Continue Singulair 10 mg daily.  Fibromyalgia Pain is stable but uncontrolled.. Continue Cymbalta and gabapentin.  She will follow-up with her spine specialist.  May need referral to rheumatology if continues to be uncontrolled.     Subjective:  HPI:  Patient request alternative to Combivent.  Insurance coverage recently changed and stated would be about $60 per inhaler.  She has been on Symbicort in the past.  She currently taking Singulair daily and tolerating well.  Patient has been seeing spine specialist as well.  Symptoms were currently uncontrolled.  Recently had injections which did not help.       Objective/Observations  Physical Exam: Gen: NAD, resting comfortably Pulm: Normal work of breathing Neuro: Grossly normal, moves all extremities Psych: Normal affect and thought content  Virtual Visit via Video   I connected with Danielle Cox on 02/10/19 at  3:40 PM EST by a video enabled telemedicine application and verified that I am speaking with the correct person using two identifiers. The limitations of evaluation and management by telemedicine and the availability of in person appointments were discussed. The patient expressed understanding and agreed to proceed.   Patient location: Home Provider location: [place] [employer] Persons participating in the virtual visit: Myself and Patient     Katina Degree. [name], MD 02/10/2019 9:11 AM

## 2019-02-11 ENCOUNTER — Other Ambulatory Visit: Payer: Self-pay | Admitting: Family Medicine

## 2019-02-16 DIAGNOSIS — G894 Chronic pain syndrome: Secondary | ICD-10-CM | POA: Diagnosis not present

## 2019-02-16 DIAGNOSIS — Z79899 Other long term (current) drug therapy: Secondary | ICD-10-CM | POA: Diagnosis not present

## 2019-02-16 DIAGNOSIS — M545 Low back pain: Secondary | ICD-10-CM | POA: Diagnosis not present

## 2019-02-16 DIAGNOSIS — M170 Bilateral primary osteoarthritis of knee: Secondary | ICD-10-CM | POA: Diagnosis not present

## 2019-02-16 DIAGNOSIS — M25551 Pain in right hip: Secondary | ICD-10-CM | POA: Diagnosis not present

## 2019-02-16 DIAGNOSIS — Z79891 Long term (current) use of opiate analgesic: Secondary | ICD-10-CM | POA: Diagnosis not present

## 2019-02-17 ENCOUNTER — Telehealth: Payer: Self-pay | Admitting: Family Medicine

## 2019-02-17 ENCOUNTER — Other Ambulatory Visit: Payer: Self-pay

## 2019-02-17 MED ORDER — BUDESONIDE-FORMOTEROL FUMARATE 80-4.5 MCG/ACT IN AERO: 2.0000 | INHALATION_SPRAY | Freq: Two times a day (BID) | RESPIRATORY_TRACT | 3 refills | Status: DC

## 2019-02-17 MED ORDER — POTASSIUM CHLORIDE CRYS ER 20 MEQ PO TBCR: EXTENDED_RELEASE_TABLET | ORAL | 1 refills | Status: DC

## 2019-02-17 NOTE — Telephone Encounter (Signed)
MEDICATION:potassium chloride SA (KLOR-CON M20) 20 MEQ tablet  PHARMACY:[facility] [place], [place] - [address]  Comments: For the -mometasone-formoterol [facility]) 100-5 MCG/ACT AERO, patient states she spoke with Dr.Parker saying that if it was to expensive that he would prescribe something else  **Let patient know to contact pharmacy at the end of the day to make sure medication is ready. **  ** Please notify patient to allow 48-72 hours to process**  **Encourage patient to contact the pharmacy for refills or they can request refills through [facility]**

## 2019-02-17 NOTE — Telephone Encounter (Signed)
FYI

## 2019-02-17 NOTE — Telephone Encounter (Signed)
Rx sent- patient aware.  

## 2019-02-23 ENCOUNTER — Other Ambulatory Visit: Payer: Self-pay | Admitting: Physician Assistant

## 2019-02-27 ENCOUNTER — Other Ambulatory Visit: Payer: Self-pay | Admitting: Physician Assistant

## 2019-03-03 DIAGNOSIS — Z20828 Contact with and (suspected) exposure to other viral communicable diseases: Secondary | ICD-10-CM | POA: Diagnosis not present

## 2019-03-23 ENCOUNTER — Other Ambulatory Visit: Payer: Self-pay | Admitting: Family Medicine

## 2019-04-14 ENCOUNTER — Encounter: Payer: Self-pay | Admitting: Family Medicine

## 2019-04-14 ENCOUNTER — Other Ambulatory Visit: Payer: Self-pay

## 2019-04-14 ENCOUNTER — Ambulatory Visit (INDEPENDENT_AMBULATORY_CARE_PROVIDER_SITE_OTHER): Payer: Medicare PPO | Admitting: Family Medicine

## 2019-04-14 VITALS — BP 120/70 | HR 99 | Temp 97.2°F | Ht 65.0 in | Wt 161.2 lb

## 2019-04-14 DIAGNOSIS — F17200 Nicotine dependence, unspecified, uncomplicated: Secondary | ICD-10-CM | POA: Diagnosis not present

## 2019-04-14 DIAGNOSIS — F319 Bipolar disorder, unspecified: Secondary | ICD-10-CM

## 2019-04-14 DIAGNOSIS — J449 Chronic obstructive pulmonary disease, unspecified: Secondary | ICD-10-CM

## 2019-04-14 DIAGNOSIS — F17210 Nicotine dependence, cigarettes, uncomplicated: Secondary | ICD-10-CM

## 2019-04-14 DIAGNOSIS — J301 Allergic rhinitis due to pollen: Secondary | ICD-10-CM

## 2019-04-14 DIAGNOSIS — I10 Essential (primary) hypertension: Secondary | ICD-10-CM

## 2019-04-14 DIAGNOSIS — M797 Fibromyalgia: Secondary | ICD-10-CM

## 2019-04-14 NOTE — Assessment & Plan Note (Signed)
Stable.  Continue lifestyle modification and regular exercise.

## 2019-04-14 NOTE — Assessment & Plan Note (Signed)
Patient was asked about her tobacco use today and was strongly advised to quit. Patient is currently trying to quit. We reviewed treatment options to assist her quit smoking including NRT, Chantix, and Bupropion. Follow up at next office visit.   Total time spent counseling approximately 5 minutes.

## 2019-04-14 NOTE — Progress Notes (Signed)
Danielle Cox is a 59 y.o. female who presents today for an office visit.  Assessment/Plan:  Chronic Problems Addressed Today: Essential hypertension At goal off medications.  Current every day smoker Patient was asked about her tobacco use today and was strongly advised to quit. Patient is currently trying to quit. We reviewed treatment options to assist her quit smoking including NRT, Chantix, and Bupropion. Follow up at next office visit.   Total time spent counseling approximately 5 minutes.    Seasonal allergic rhinitis due to pollen Continue singulair 10mg  daily.   COPD (chronic obstructive pulmonary disease) (HCC) Stable.  Continue Singulair 10 mg daily.  Not currently on any controller inhaler.  Fibromyalgia Stable.  Continue lifestyle modification and regular exercise.  Affective psychosis, bipolar (HCC) Stable.  Continue management per psychiatry.     Subjective:  HPI:  Her stable, chronic medical conditions are outlined below:  # Essential Hypertension - Controlled without medications  # COPD / Seasonal Allergies / Current Smoker - On singulair 10mg  daily - Cutting down on smoking   # Schizoaffective Disorder - Follows with [facility]  - On abilify 15mg  daily, tranzene 7.5mg  daily, cymbalta 90mg  daily, luvox 50mg  daily, provigil 400mg  daily, risperidone 3mg  daily, requip 1mg  daily, and trazodone 150mg  three times daily as needed  # Chronic Pain / fibromyalgia - Follows with pain management  PMH:  The following were reviewed and entered/updated in epic: Past Medical History:  Diagnosis Date  . Anxiety   . Bipolar disorder (HCC)   . Bronchitis, chronic (HCC)   . COPD (chronic obstructive pulmonary disease) (HCC)   . Depression   . Personality disorder [facility])    Patient Active Problem List   Diagnosis Date Noted  . Seasonal allergic rhinitis due to pollen 08/24/2018  . Arthralgia of multiple joints 08/24/2018  . Insulin resistance  08/24/2018  . Cervical radiculopathy 12/29/2017  . Lumbar back pain 12/29/2017  . Fibromyalgia 11/21/2017  . COPD (chronic obstructive pulmonary disease) (HCC) 11/21/2017  . Affective psychosis, bipolar (HCC)   . Major depressive disorder, recurrent severe without psychotic features (HCC) 12/13/2016  . Bipolar 1 disorder, depressed, severe (HCC) 11/20/2016    Class: Chronic  . Personality disorder (HCC) 04/23/2013  . Current every day smoker 02/17/2009  . Essential hypertension 02/17/2009  . Cough 02/17/2009   Past Surgical History:  Procedure Laterality Date  . CARPAL TUNNEL RELEASE Right   . CESAREAN SECTION  1983, 1988, 1992  . TONSILLECTOMY  age 25    Family History  Problem Relation Age of Onset  . Depression Mother   . Alcohol abuse Mother   . Depression Father   . Alcohol abuse Father   . Alcohol abuse Sister   . Depression Sister   . Alcohol abuse Brother   . Depression Brother        overdose  . Breast cancer Neg Hx     Medications- reviewed and updated Current Outpatient Medications  Medication Sig Dispense Refill  . ARIPiprazole (ABILIFY) 10 MG tablet 15 mg.     . atorvastatin (LIPITOR) 20 MG tablet TAKE ONE TABLET BY MOUTH DAILY 90 tablet 0  . azelastine (ASTELIN) 0.1 % nasal spray Place 2 sprays into both nostrils 2 (two) times daily. Use in each nostril as directed 30 mL 12  . Cholecalciferol (D3 MAXIMUM STRENGTH PO) Take 2,500 mg by mouth daily.    . clorazepate (TRANXENE) 7.5 MG tablet Take 7.5 mg by mouth daily.     13/07/2017  Cyanocobalamin (B-12) 2500 MCG TABS Take 2,500 mg by mouth daily.    . DULoxetine (CYMBALTA) 30 MG capsule Take 3 capsules by mouth daily.    . IRON, FERROUS SULFATE, PO Take 65 mg by mouth daily.    . montelukast (SINGULAIR) 10 MG tablet TAKE ONE TABLET BY MOUTH EVERY NIGHT AT BEDTIME 30 tablet 2  . potassium chloride SA (KLOR-CON M20) 20 MEQ tablet TAKE ONE TABLET BY MOUTH EVERY MORNING 30 tablet 1  . rOPINIRole (REQUIP) 1 MG tablet      . traZODone (DESYREL) 150 MG tablet 3 (three) times daily.     . fluvoxaMINE (LUVOX) 50 MG tablet Take 50 mg by mouth at bedtime.    . furosemide (LASIX) 20 MG tablet TAKE ONE TABLET BY MOUTH DAILY (Patient not taking: Reported on 04/14/2019) 30 tablet 0  . meclizine (ANTIVERT) 25 MG tablet TAKE ONE TABLET BY MOUTH EVERY 8 HOURS AS NEEDED FOR DIZZINESS (Patient not taking: Reported on 04/14/2019) 20 tablet 0  . modafinil (PROVIGIL) 200 MG tablet Take 400 mg by mouth daily.     Marland Kitchen nystatin (MYCOSTATIN) 100000 UNIT/ML suspension     . risperiDONE (RISPERDAL) 1 MG tablet Take 2 mg by mouth daily.      No current facility-administered medications for this visit.    Allergies-reviewed and updated Allergies  Allergen Reactions  . Moxifloxacin Hcl Palpitations    REACTION: tackycardia  . Penicillins Rash    Has patient had a PCN reaction causing immediate rash, facial/tongue/throat swelling, SOB or lightheadedness with hypotension: Yes Has patient had a PCN reaction causing severe rash involving mucus membranes or skin necrosis: No Has patient had a PCN reaction that required hospitalization: No Has patient had a PCN reaction occurring within the last 10 years: No If all of the above answers are "NO", then may proceed with Cephalosporin use.   . Corticosteroids Other (See Comments)    Mania    Social History   Socioeconomic History  . Marital status: Married    Spouse name: Onalee Hua  . Number of children: 3  . Years of education: Not on file  . Highest education level: GED or equivalent  Occupational History  . Occupation: disabled  Tobacco Use  . Smoking status: Current Every Day Smoker    Packs/day: 1.00    Years: 40.00    Pack years: 40.00    Types: Cigarettes  . Smokeless tobacco: Never Used  Substance and Sexual Activity  . Alcohol use: No  . Drug use: No  . Sexual activity: Not Currently    Partners: Male    Birth control/protection: None  Other Topics Concern  . Not on  file  Social History Narrative   10/29/2012 [name] Tajuana was born and grew up in [place], [place]. She has 2 brothers and one sister. She reports that her childhood was "poor," and explains that she needs both financially and emotionally. She completed the 10th grade, then achieved her GED. She has been married twice. The first time at age 24, and that ended after 1 year. She is currently married to her second husband of 27 years. She has 2 sons and one daughter. She is currently unemployed and on disability for 2 years. She lives with her husband, her daughter, and one of her daughter's friends. She denies any legal difficulties. She reports that she is spiritual but not religious. Her hobbies include reading and cross stitch. She reports that she has no social support network. 10/29/2012 [name]  Patient is right-handed.she lives with her husband ina one story home with a few steps to enter. She drinks 2 cups of tea a day. She is active around the home.   Social Determinants of Health   Financial Resource Strain:   . Difficulty of Paying Living Expenses:   Food Insecurity:   . Worried About Charity fundraiser in the Last Year:   . Arboriculturist in the Last Year:   Transportation Needs:   . Film/video editor (Medical):   Marland Kitchen Lack of Transportation (Non-Medical):   Physical Activity:   . Days of Exercise per Week:   . Minutes of Exercise per Session:   Stress:   . Feeling of Stress :   Social Connections:   . Frequency of Communication with Friends and Family:   . Frequency of Social Gatherings with Friends and Family:   . Attends Religious Services:   . Active Member of Clubs or Organizations:   . Attends Archivist Meetings:   Marland Kitchen Marital Status:           Objective:  Physical Exam: BP 120/70 (BP Location: Left Arm, Patient Position: Sitting, Cuff Size: Normal)   Pulse 99   Temp (!) 97.2 F (36.2 C) (Temporal)   Ht 5\' 5"  (1.651 m)   Wt 161 lb 3.2 oz (73.1 kg)   LMP   (LMP Unknown)   SpO2 98%   BMI 26.83 kg/m   Gen: No acute distress, resting comfortably CV: Regular rate and rhythm with no murmurs appreciated Pulm: Normal work of breathing, clear to auscultation bilaterally with no crackles, wheezes, or rhonchi Neuro: Grossly normal, moves all extremities Psych: Normal affect and thought content  Time Spent: 45 minutes of total time was spent on the date of the encounter performing the following actions: chart review prior to seeing the patient, obtaining history, performing a medically necessary exam, counseling on the treatment plan, placing orders, and documenting in our EHR.        Algis Greenhouse. Jerline Pain, MD 04/14/2019 9:08 AM

## 2019-04-14 NOTE — Patient Instructions (Signed)
It was very nice to see you today!  I am glad that you are feeling better!  No changes today.  Please come back in 6 months or so for your annual well visit with blood work, or sooner if needed.  Take care, Dr [name]  Please try these tips to maintain a healthy lifestyle:   Eat at least 3 REAL meals and 1-2 snacks per day.  Aim for no more than 5 hours between eating.  If you eat breakfast, please do so within one hour of getting up.    Each meal should contain half fruits/vegetables, one quarter protein, and one quarter carbs (no bigger than a computer mouse)   Cut down on sweet beverages. This includes juice, soda, and sweet tea.     Drink at least 1 glass of water with each meal and aim for at least 8 glasses per day   Exercise at least 150 minutes every week.

## 2019-04-14 NOTE — Assessment & Plan Note (Signed)
Stable.  Continue Singulair 10 mg daily.  Not currently on any controller inhaler.

## 2019-04-14 NOTE — Assessment & Plan Note (Signed)
At goal off medications. 

## 2019-04-14 NOTE — Assessment & Plan Note (Signed)
Stable.  Continue management per psychiatry. 

## 2019-04-14 NOTE — Assessment & Plan Note (Addendum)
Continue singulair 10mg  daily.

## 2019-04-19 ENCOUNTER — Other Ambulatory Visit: Payer: Self-pay | Admitting: Family Medicine

## 2019-04-21 ENCOUNTER — Other Ambulatory Visit: Payer: Self-pay | Admitting: Family Medicine

## 2019-04-22 DIAGNOSIS — F419 Anxiety disorder, unspecified: Secondary | ICD-10-CM | POA: Diagnosis not present

## 2019-04-22 DIAGNOSIS — F3175 Bipolar disorder, in partial remission, most recent episode depressed: Secondary | ICD-10-CM | POA: Diagnosis not present

## 2019-04-24 ENCOUNTER — Telehealth: Payer: Self-pay | Admitting: Family Medicine

## 2019-04-24 DIAGNOSIS — H2513 Age-related nuclear cataract, bilateral: Secondary | ICD-10-CM | POA: Diagnosis not present

## 2019-04-24 NOTE — Telephone Encounter (Signed)
°  LAST APPOINTMENT DATE: 04/21/2019   NEXT APPOINTMENT DATE:@Visit  date not found  MEDICATION:potassium chloride SA (KLOR-CON M20) 20 MEQ tablet  PHARMACY:[facility] [place], [place] - [address]  **Let patient know to contact pharmacy at the end of the day to make sure medication is ready. **  ** Please notify patient to allow 48-72 hours to process**  **Encourage patient to contact the pharmacy for refills or they can request refills through [facility]**  CLINICAL FILLS OUT ALL BELOW:   LAST REFILL:  QTY:  REFILL DATE:    OTHER COMMENTS:    Okay for refill?  Please advise

## 2019-04-27 ENCOUNTER — Other Ambulatory Visit: Payer: Self-pay

## 2019-04-27 MED ORDER — POTASSIUM CHLORIDE CRYS ER 20 MEQ PO TBCR: EXTENDED_RELEASE_TABLET | ORAL | 1 refills | Status: DC

## 2019-04-27 NOTE — Telephone Encounter (Signed)
Rx sent 

## 2019-05-10 ENCOUNTER — Other Ambulatory Visit: Payer: Self-pay | Admitting: Family Medicine

## 2019-05-11 ENCOUNTER — Other Ambulatory Visit: Payer: Self-pay | Admitting: Family Medicine

## 2019-05-15 ENCOUNTER — Telehealth: Payer: Self-pay | Admitting: Family Medicine

## 2019-05-15 ENCOUNTER — Emergency Department (HOSPITAL_COMMUNITY)
Admission: EM | Admit: 2019-05-15 | Discharge: 2019-05-15 | Disposition: A | Discharge status: Home / Self Care | Payer: Medicare PPO | Attending: Emergency Medicine | Admitting: Emergency Medicine

## 2019-05-15 ENCOUNTER — Other Ambulatory Visit: Payer: Self-pay

## 2019-05-15 DIAGNOSIS — Z79899 Other long term (current) drug therapy: Secondary | ICD-10-CM | POA: Insufficient documentation

## 2019-05-15 DIAGNOSIS — J449 Chronic obstructive pulmonary disease, unspecified: Secondary | ICD-10-CM | POA: Insufficient documentation

## 2019-05-15 DIAGNOSIS — F310 Bipolar disorder, current episode hypomanic: Secondary | ICD-10-CM

## 2019-05-15 DIAGNOSIS — F332 Major depressive disorder, recurrent severe without psychotic features: Secondary | ICD-10-CM | POA: Diagnosis not present

## 2019-05-15 DIAGNOSIS — Z046 Encounter for general psychiatric examination, requested by authority: Secondary | ICD-10-CM | POA: Diagnosis present

## 2019-05-15 DIAGNOSIS — F17210 Nicotine dependence, cigarettes, uncomplicated: Secondary | ICD-10-CM | POA: Insufficient documentation

## 2019-05-15 DIAGNOSIS — Z5320 Procedure and treatment not carried out because of patient's decision for unspecified reasons: Secondary | ICD-10-CM | POA: Diagnosis not present

## 2019-05-15 DIAGNOSIS — Z03818 Encounter for observation for suspected exposure to other biological agents ruled out: Secondary | ICD-10-CM | POA: Diagnosis not present

## 2019-05-15 DIAGNOSIS — I10 Essential (primary) hypertension: Secondary | ICD-10-CM | POA: Diagnosis not present

## 2019-05-15 DIAGNOSIS — Z20822 Contact with and (suspected) exposure to covid-19: Secondary | ICD-10-CM | POA: Insufficient documentation

## 2019-05-15 DIAGNOSIS — F1310 Sedative, hypnotic or anxiolytic abuse, uncomplicated: Secondary | ICD-10-CM | POA: Insufficient documentation

## 2019-05-15 LAB — COMPREHENSIVE METABOLIC PANEL
ALT: 34 U/L (ref 0–44)
AST: 25 U/L (ref 15–41)
Albumin: 4.2 g/dL (ref 3.5–5.0)
Alkaline Phosphatase: 60 U/L (ref 38–126)
Anion gap: 7 (ref 5–15)
BUN: 18 mg/dL (ref 6–20)
CO2: 31 mmol/L (ref 22–32)
Calcium: 9.4 mg/dL (ref 8.9–10.3)
Chloride: 106 mmol/L (ref 98–111)
Creatinine, Ser: 0.68 mg/dL (ref 0.44–1.00)
GFR calc Af Amer: >60 mL/min (ref >60)
GFR calc non Af Amer: >60 mL/min (ref >60)
Glucose, Bld: 115 mg/dL — ABNORMAL HIGH (ref 70–99)
Potassium: 3.6 mmol/L (ref 3.5–5.1)
Sodium: 144 mmol/L (ref 135–145)
Total Bilirubin: 0.5 mg/dL (ref 0.3–1.2)
Total Protein: 6.7 g/dL (ref 6.5–8.1)

## 2019-05-15 LAB — CBC
HCT: 45.4 % (ref 36.0–46.0)
Hemoglobin: 15 g/dL (ref 12.0–15.0)
MCH: 31 pg (ref 26.0–34.0)
MCHC: 33 g/dL (ref 30.0–36.0)
MCV: 93.8 fL (ref 80.0–100.0)
Platelets: 278 10*3/uL (ref 150–400)
RBC: 4.84 MIL/uL (ref 3.87–5.11)
RDW: 13.9 % (ref 11.5–15.5)
WBC: 11.3 10*3/uL — ABNORMAL HIGH (ref 4.0–10.5)
nRBC: 0 % (ref 0.0–0.2)

## 2019-05-15 LAB — RAPID URINE DRUG SCREEN, HOSP PERFORMED
Amphetamines: NOT DETECTED
Barbiturates: NOT DETECTED
Benzodiazepines: POSITIVE — AB
Cocaine: NOT DETECTED
Opiates: NOT DETECTED
Tetrahydrocannabinol: NOT DETECTED

## 2019-05-15 LAB — RESPIRATORY PANEL BY RT PCR (FLU A&B, COVID)
Influenza A by PCR: NEGATIVE
Influenza B by PCR: NEGATIVE
SARS Coronavirus 2 by RT PCR: NEGATIVE

## 2019-05-15 LAB — I-STAT BETA HCG BLOOD, ED (MC, WL, AP ONLY): I-stat hCG, quantitative: <5 m[IU]/mL (ref <5)

## 2019-05-15 LAB — ETHANOL: Alcohol, Ethyl (B): <10 mg/dL (ref <10)

## 2019-05-15 NOTE — ED Notes (Signed)
Called to give report to [facility]; nurse there reports he is unaware of a new patient coming because he has not received any admission orders

## 2019-05-15 NOTE — Telephone Encounter (Signed)
FYI

## 2019-05-15 NOTE — ED Triage Notes (Signed)
Pt came to [facility] ED voluntarily.pt brought by husband. Pt stated having bi polar episode. Pt stated manic at times, "up doing things".  Pt cooperative. Dressing out.

## 2019-05-15 NOTE — ED Provider Notes (Signed)
Patient was medically cleared and evaluated by psychiatry thought to be a candidate for inpatient.  Patient is currently voluntary and a bed was found in [place].  However her husband's called and stated that he was coming to pick her up because he did not want her to go to [place].  At this time patient is not suicidal or homicidal.  She is voluntary and we have no grounds to IVC her at this time.   Gwyneth Sprout, MD 05/15/19 2113

## 2019-05-15 NOTE — Progress Notes (Signed)
Pt accepted to [facility]; Room to be determined.    Dr. Margarita Rana is the accepting and attending physician.   Call report to [phone].   Angela @ [facility] ED notified.     Pt is Voluntary.    Pt may be transported by [employer].   Pt scheduled to arrive anytime, the bed is available now.   Drucilla Schmidt, MSW, LCSW-A Clinical Disposition Social Worker [facility]/TTS [phone]

## 2019-05-15 NOTE — Telephone Encounter (Signed)
Danielle Cox called in this morning states that psychiatrist/ counseling is not helping. patient is very confused, had patient triaged. The outcome was to go to ER now for the Schizophrenia.

## 2019-05-15 NOTE — Progress Notes (Addendum)
Patient meets inpatient criteria per Ophelia Shoulder, NP. Patient has been faxed out to the following facilities for review:    [facility]  [facility]   [facility]  [facility]  TTS will continue to follow and assist with securing bed placement.   Drucilla Schmidt, MSW, LCSW-A Clinical Disposition Social Worker [facility]/TTS [phone]

## 2019-05-15 NOTE — Telephone Encounter (Signed)
Nurse Assessment Nurse: Gasper Sells, RN, Marylu Lund Date/Time Lamount Cohen Time): 05/15/2019 9:25:28 AM Confirm and document reason for call. If symptomatic, describe symptoms. ---Caller states his wife is bipolar and is confused for last 1-2 weeks. Contacted psychiatrist yesterday. Taking caffeine pills. Strange behavior with regards to homewares. Took bowls out of cabinet and gave them away. Adult children have noticed. Psych referred to med. Dr for labs. Had covid shot about week ago. Has the patient had close contact with a person known or suspected to have the novel coronavirus illness OR traveled / lives in area with major community spread (including international travel) in the last 14 days from the onset of symptoms? * If Asymptomatic, screen for exposure and travel within the last 14 days. ---No Does the patient have any new or worsening symptoms? ---Yes Will a triage be completed? ---Yes Related visit to physician within the last 2 weeks? ---No Does the PT have any chronic conditions? (i.e. diabetes, asthma, this includes High risk factors for pregnancy, etc.) ---Yes List chronic conditions. ---Bipolar, subtle changes, smoker, COPD, Is this a behavioral health or substance abuse call? ---Yes Are you having any thoughts or feelings of harming or killing yourself or someone else? ---No Are you currently experiencing any physical discomfort that you think may be related to the use of alcohol or other drugs? (use substance abuse or alcohol abuse guidelines. These include withdrawal symptoms) ---NoPLEASE NOTE: All timestamps contained within this report are represented as [place] Standard Time. CONFIDENTIALTY NOTICE: This fax transmission is intended only for the addressee. It contains information that is legally privileged, confidential or otherwise protected from use or disclosure. If you are not the intended recipient, you are strictly prohibited from reviewing, disclosing, copying using  or disseminating any of this information or taking any action in reliance on or regarding this information. If you have received this fax in error, please notify us immediately by telephone so that we can arrange for its return to [place]. Phone: [phone], Toll-Free: [phone], Fax: [phone] Page: 2 of 2 Call Id: [id number] Nurse Assessment Do you worry that you may be hearing or seeing things that others do not? ---No Do you take medications for your condition(s)? ---Yes List medications here. ---trazadone, abilify, provigil, requip, 1mg  HS, singulair, cymbalta Guidelines Guideline Title Affirmed Question Affirmed Notes Nurse Date/Time (Eastern Time) Bipolar Disorder (Manic Depression) [1] Bipolar disorder (manic depression) AND [2] unable to do any of normal activities (e.g., self care, school, work; in comparison to baseline). , RN, Gasper Sells 05/15/2019 9:33:58 AM Disp. Time 05/17/2019 Time) Disposition Final User 05/15/2019 9:23:22 AM Send to Urgent Queue 05/17/2019 05/15/2019 9:36:18 AM Go to ED Now Yes 05/17/2019, RN, Gasper Sells Disagree/Comply Comply Caller Understands Yes PreDisposition Call Doctor Care Advice Given Per Guideline GO TO ED NOW: * You need to be seen in the Emergency Department. * Go to the ED at ___________ Hospital. * Leave now. Drive carefully. CARE ADVICE given per Bipolar Disorder (Adult) guideline. ALTERNATE DISPOSITION - CALL MENTAL HEALTH PROFESSIONAL NOW: * If patient has a Herbert Pun, psychologist or counselor, recommend the caller phone the mental health professional now. ANOTHER ADULT SHOULD DRIVE: * It is better and safer if another adult drives instead of you.

## 2019-05-15 NOTE — ED Notes (Signed)
Patient wanded by security. 

## 2019-05-15 NOTE — ED Provider Notes (Signed)
[facility] DEPT Provider Note   CSN: [id number] Arrival date & time: 05/15/19  1107     History Chief Complaint  Patient presents with  . Medical Clearance    Danielle Cox is a 59 y.o. female.  HPI Patient reportedly brought in by husband.  History of bipolar disease.  Patient states she is manic.  States she has been feeling manic for a while now.  States she is taking caffeine pills to.  Denies hallucinations.  States she is here because her husband thought she needed some help.  Sees Dr. Reece Levy but has not seen him in a few months.  States has been feeling bad for months now.  No hallucinations.  No suicidal thoughts.  States she continues to take her medications.    Past Medical History:  Diagnosis Date  . Anxiety   . Bipolar disorder ([place])   . Bronchitis, chronic ([place])   . COPD (chronic obstructive pulmonary disease) ([place])   . Depression   . Personality disorder [facility]     Patient Active Problem List   Diagnosis Date Noted  . Seasonal allergic rhinitis due to pollen 08/24/2018  . Arthralgia of multiple joints 08/24/2018  . Insulin resistance 08/24/2018  . Cervical radiculopathy 12/29/2017  . Lumbar back pain 12/29/2017  . Fibromyalgia 11/21/2017  . COPD (chronic obstructive pulmonary disease) ([place]) 11/21/2017  . Affective psychosis, bipolar ([place])   . Major depressive disorder, recurrent severe without psychotic features ([place]) 12/13/2016  . Bipolar 1 disorder, depressed, severe ([place]) 11/20/2016    Class: Chronic  . Personality disorder ([place]) 04/23/2013  . Current every day smoker 02/17/2009  . Essential hypertension 02/17/2009  . Cough 02/17/2009    Past Surgical History:  Procedure Laterality Date  . CARPAL TUNNEL RELEASE Right   . [place]  . TONSILLECTOMY  age 62     OB History   No obstetric history on file.     Family History  Problem Relation Age of Onset  . Depression Mother   .  Alcohol abuse Mother   . Depression Father   . Alcohol abuse Father   . Alcohol abuse Sister   . Depression Sister   . Alcohol abuse Brother   . Depression Brother        overdose  . Breast cancer Neg Hx     Social History   Tobacco Use  . Smoking status: Current Every Day Smoker    Packs/day: 1.00    Years: 40.00    Pack years: 40.00    Types: Cigarettes  . Smokeless tobacco: Never Used  Substance Use Topics  . Alcohol use: No  . Drug use: No    Home Medications Prior to Admission medications   Medication Sig Start Date End Date Taking? Authorizing Provider  ARIPiprazole (ABILIFY) 10 MG tablet 15 mg.  08/12/18   [provider]  atorvastatin (LIPITOR) 20 MG tablet TAKE ONE TABLET BY MOUTH DAILY 05/11/19   Orma Flaming, MD  azelastine (ASTELIN) 0.1 % nasal spray Place 2 sprays into both nostrils 2 (two) times daily. Use in each nostril as directed 08/22/18   Briscoe Deutscher, DO  Cholecalciferol (D3 MAXIMUM STRENGTH PO) Take 2,500 mg by mouth daily.    [provider]  clorazepate (TRANXENE) 7.5 MG tablet Take 7.5 mg by mouth daily.  01/22/18   [provider]  Cyanocobalamin (B-12) 2500 MCG TABS Take 2,500 mg by mouth daily.    [provider]  DULoxetine (CYMBALTA) 30 MG capsule Take 3 capsules by mouth daily. 12/17/18   [provider]  fluvoxaMINE (LUVOX) 50 MG tablet Take 50 mg by mouth at bedtime.    [provider]  furosemide (LASIX) 20 MG tablet TAKE ONE TABLET BY MOUTH DAILY 04/20/19   [name], MD  IRON, FERROUS SULFATE, PO Take 65 mg by mouth daily.    [provider]  meclizine (ANTIVERT) 25 MG tablet TAKE ONE TABLET BY MOUTH EVERY 8 HOURS AS NEEDED FOR DIZZINESS Patient not taking: Reported on 04/14/2019 11/27/18   Willow Ora, MD  modafinil (PROVIGIL) 200 MG tablet Take 400 mg by mouth daily.     [provider]  montelukast (SINGULAIR) 10 MG tablet TAKE ONE TABLET BY MOUTH EVERY NIGHT AT  BEDTIME 09/24/18   [name], DO  nystatin (MYCOSTATIN) 100000 UNIT/ML suspension  08/22/18   [provider]  potassium chloride SA (KLOR-CON M20) 20 MEQ tablet TAKE ONE TABLET BY MOUTH EVERY MORNING 04/27/19   [name], MD  risperiDONE (RISPERDAL) 1 MG tablet Take 2 mg by mouth daily.  06/16/18   [provider]  rOPINIRole (REQUIP) 1 MG tablet  08/29/18   [provider]  traZODone (DESYREL) 150 MG tablet 3 (three) times daily.  07/16/17   [provider]    Allergies    Moxifloxacin hcl, Penicillins, and Corticosteroids  Review of Systems   Review of Systems  Constitutional: Negative for appetite change.  HENT: Negative for congestion.   Respiratory: Negative for shortness of breath.   Cardiovascular: Negative for chest pain.  Gastrointestinal: Negative for abdominal pain.  Musculoskeletal: Negative for back pain.  Skin: Negative for pallor.  Neurological: Negative for weakness.  Psychiatric/Behavioral: Negative for hallucinations and suicidal ideas. The patient is hyperactive.     Physical Exam Updated Vital Signs BP (!) 143/68 (BP Location: Left Arm)   Pulse 95   Temp 98.9 F (37.2 C) (Oral)   Resp 18   LMP  (LMP Unknown)   SpO2 96%   Physical Exam Vitals and nursing note reviewed.  Eyes:     Pupils: Pupils are equal, round, and reactive to light.  Pulmonary:     Breath sounds: No wheezing or rhonchi.  Abdominal:     Tenderness: There is no abdominal tenderness.  Musculoskeletal:        General: No tenderness.     Cervical back: Neck supple.  Skin:    General: Skin is warm.  Neurological:     Mental Status: She is alert and oriented to person, place, and time.     ED Results / Procedures / Treatments   Labs (all labs ordered are listed, but only abnormal results are displayed) Labs Reviewed  RESPIRATORY PANEL BY RT PCR (FLU A&B, COVID)  COMPREHENSIVE METABOLIC PANEL  ETHANOL  RAPID URINE DRUG SCREEN, HOSP PERFORMED   CBC  I-STAT BETA HCG BLOOD, ED ([facility], [facility], [facility] ONLY)    EKG None  Radiology No results found.  Procedures Procedures (including critical care time)  Medications Ordered in ED Medications - No data to display  ED Course  I have reviewed the triage vital signs and the nursing notes.  Pertinent labs & imaging results that were available during my care of the patient were reviewed by me and considered in my medical decision making (see chart for details).    MDM Rules/Calculators/A&P  Patient with history of bipolar disorder.  States she is manic now.  Will get lab work but medically cleared.  Denies suicidal homicidal thoughts. Final Clinical Impression(s) / ED Diagnoses Final diagnoses:  Bipolar affective disorder, current episode hypomanic [facility])    Rx / DC Orders ED Discharge Orders    None       Benjiman Core, MD 05/15/19 1207

## 2019-05-15 NOTE — ED Notes (Signed)
Spoke with patient's husband: Does not want patient to go to [place] because it's too far. I explained the process and the facilities that were contacted for placement. He decided that he would come get her; wife concurred
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
## 2019-05-16 ENCOUNTER — Other Ambulatory Visit: Payer: Self-pay | Admitting: Family Medicine

## 2019-05-16 ENCOUNTER — Emergency Department (HOSPITAL_COMMUNITY)
Admission: EM | Admit: 2019-05-16 | Discharge: 2019-05-18 | Disposition: A | Discharge status: Other Acute Inpatient Hospital | Payer: Medicare PPO | Attending: Emergency Medicine | Admitting: Emergency Medicine

## 2019-05-16 ENCOUNTER — Encounter (HOSPITAL_COMMUNITY): Payer: Self-pay | Admitting: Emergency Medicine

## 2019-05-16 DIAGNOSIS — F319 Bipolar disorder, unspecified: Secondary | ICD-10-CM | POA: Diagnosis present

## 2019-05-16 DIAGNOSIS — Z79899 Other long term (current) drug therapy: Secondary | ICD-10-CM | POA: Diagnosis not present

## 2019-05-16 DIAGNOSIS — F314 Bipolar disorder, current episode depressed, severe, without psychotic features: Secondary | ICD-10-CM | POA: Diagnosis not present

## 2019-05-16 DIAGNOSIS — F329 Major depressive disorder, single episode, unspecified: Secondary | ICD-10-CM

## 2019-05-16 DIAGNOSIS — R000 Tachycardia, unspecified: Secondary | ICD-10-CM | POA: Diagnosis not present

## 2019-05-16 DIAGNOSIS — F3112 Bipolar disorder, current episode manic without psychotic features, moderate: Secondary | ICD-10-CM | POA: Insufficient documentation

## 2019-05-16 DIAGNOSIS — J449 Chronic obstructive pulmonary disease, unspecified: Secondary | ICD-10-CM | POA: Diagnosis not present

## 2019-05-16 DIAGNOSIS — F17210 Nicotine dependence, cigarettes, uncomplicated: Secondary | ICD-10-CM | POA: Diagnosis not present

## 2019-05-16 DIAGNOSIS — F419 Anxiety disorder, unspecified: Secondary | ICD-10-CM | POA: Diagnosis not present

## 2019-05-16 DIAGNOSIS — F3010 Manic episode without psychotic symptoms, unspecified: Secondary | ICD-10-CM

## 2019-05-16 DIAGNOSIS — F32A Depression, unspecified: Secondary | ICD-10-CM

## 2019-05-16 DIAGNOSIS — F309 Manic episode, unspecified: Secondary | ICD-10-CM | POA: Diagnosis not present

## 2019-05-16 MED ORDER — ACETAMINOPHEN 500 MG PO TABS: 1000.0000 mg | ORAL_TABLET | Freq: Four times a day (QID) | ORAL | Status: DC | PRN

## 2019-05-16 MED ORDER — NAPROXEN SODIUM 275 MG PO TABS
275.0000 mg | ORAL_TABLET | Freq: Two times a day (BID) | ORAL | Status: DC | PRN
  Filled 2019-05-16: qty 2

## 2019-05-16 MED ORDER — CLORAZEPATE DIPOTASSIUM 7.5 MG PO TABS
7.5000 mg | ORAL_TABLET | Freq: Every day | ORAL | Status: DC
  Filled 2019-05-16 (×2): qty 1

## 2019-05-16 MED ORDER — MONTELUKAST SODIUM 10 MG PO TABS
10.0000 mg | ORAL_TABLET | Freq: Every day | ORAL | Status: DC
  Administered 2019-05-17: 10 mg via ORAL
  Filled 2019-05-16: qty 1

## 2019-05-16 MED ORDER — TRAZODONE HCL 50 MG PO TABS
450.0000 mg | ORAL_TABLET | Freq: Every day | ORAL | Status: DC
  Administered 2019-05-16: 450 mg via ORAL
  Filled 2019-05-16: qty 4

## 2019-05-16 MED ORDER — ASPIRIN 81 MG PO CHEW
81.0000 mg | CHEWABLE_TABLET | Freq: Every day | ORAL | Status: DC
  Administered 2019-05-16 – 2019-05-18 (×3): 81 mg via ORAL
  Filled 2019-05-16 (×3): qty 1

## 2019-05-16 MED ORDER — DULOXETINE HCL 30 MG PO CPEP
90.0000 mg | ORAL_CAPSULE | Freq: Every day | ORAL | Status: DC
  Administered 2019-05-17 – 2019-05-18 (×2): 90 mg via ORAL
  Filled 2019-05-16 (×3): qty 3

## 2019-05-16 MED ORDER — RISPERIDONE 2 MG PO TABS
2.0000 mg | ORAL_TABLET | Freq: Every day | ORAL | Status: DC
  Administered 2019-05-16 – 2019-05-17 (×2): 2 mg via ORAL
  Filled 2019-05-16 (×2): qty 1

## 2019-05-16 MED ORDER — MODAFINIL 200 MG PO TABS
400.0000 mg | ORAL_TABLET | Freq: Every day | ORAL | Status: DC
  Administered 2019-05-16: 400 mg via ORAL
  Filled 2019-05-16 (×2): qty 2

## 2019-05-16 MED ORDER — ASCORBIC ACID 500 MG PO TABS
1500.0000 mg | ORAL_TABLET | Freq: Every day | ORAL | Status: DC
  Administered 2019-05-17 – 2019-05-18 (×2): 1500 mg via ORAL
  Filled 2019-05-16 (×3): qty 3

## 2019-05-16 MED ORDER — FUROSEMIDE 20 MG PO TABS
20.0000 mg | ORAL_TABLET | Freq: Every day | ORAL | Status: DC
  Administered 2019-05-16 – 2019-05-18 (×3): 20 mg via ORAL
  Filled 2019-05-16 (×3): qty 1

## 2019-05-16 MED ORDER — POTASSIUM CHLORIDE CRYS ER 20 MEQ PO TBCR
20.0000 meq | EXTENDED_RELEASE_TABLET | Freq: Every day | ORAL | Status: DC
  Administered 2019-05-16 – 2019-05-18 (×3): 20 meq via ORAL
  Filled 2019-05-16 (×3): qty 1

## 2019-05-16 MED ORDER — ATORVASTATIN CALCIUM 20 MG PO TABS
20.0000 mg | ORAL_TABLET | Freq: Every day | ORAL | Status: DC
  Administered 2019-05-16 – 2019-05-18 (×3): 20 mg via ORAL
  Filled 2019-05-16 (×2): qty 1
  Filled 2019-05-16: qty 2

## 2019-05-16 MED ORDER — ROPINIROLE HCL 1 MG PO TABS
2.0000 mg | ORAL_TABLET | Freq: Every day | ORAL | Status: DC
  Administered 2019-05-17 – 2019-05-18 (×2): 2 mg via ORAL
  Filled 2019-05-16 (×2): qty 2

## 2019-05-16 MED ORDER — POLYVINYL ALCOHOL 1.4 % OP SOLN: 1.0000 [drp] | OPHTHALMIC | Status: DC | PRN

## 2019-05-16 MED ORDER — ARIPIPRAZOLE 10 MG PO TABS
10.0000 mg | ORAL_TABLET | Freq: Every evening | ORAL | Status: DC
  Administered 2019-05-16 – 2019-05-17 (×2): 10 mg via ORAL
  Filled 2019-05-16 (×2): qty 1
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
## 2019-05-16 NOTE — ED Notes (Signed)
Pt changed into scrubs. Personal clothes placed in pt belongings bag.

## 2019-05-16 NOTE — [facility] Counselor (Signed)
Clinician received a phone call from admissions at [facility] in [place], [place] at 2149. Admissions stated they were open to accepting pt, however, they knew pt's husband had refused her offer to go to [facility] in [place], [place] yesterday, which is 90 minutes away, so they were unsure if pt's husband would accept this offer, as [place] is further away (3 hours). Clinician stated she would talk to pt's husband and see if he was open to this hospital placement.  Clinician contacted pt's husband at 2157 and explained that pt had been offered a bed at [facility] in [place] but that I wanted to talk with him about this potential placement prior to accepting the bed offer. [name] expressed that his wife is not actively aware of what is going on and that he is concerned that, with the placement being so far away, once she is coherent, she will not understand where she is and why he is not there to visit her, as he works 9 hour days and has a 1-hour commute, so will be unable to visit her daily. Clinician expressed an understanding of this, but also shared that this hospital is not able to hand-select placements for pt, so while pt's husband has a right to refuse services at a particular hospital/location, [facility] has to continue to refer pt out to all available hospitals and offer those placements. [name] inquired as to what the likelihood will be that pt will receive another offer at another hospital, and clinician stated that, between some hospitals not accepting pts from other hospitals and there being an increase in pts requiring inpatient care, it has been more difficult to get pts placed as close to home as it has been in the past, though there is no way to predict what will or will not happen. [name] expressed an understanding and stated he was going to decline the placement and hope that another placement became available that was closer to home. Clinician  expressed an understanding and stated she would relay this information to [facility] in [place]. Clinician provided pt's husband with the phone number to pt's nurse's desk so he could check on his wife and encouraged pt's husband to call with any questions. [name] agreed to do so.    Wandra Arthurs, pt's husband: [phone] Orinda Kenner, [facility] in [place], Admissions: [phone]

## 2019-05-16 NOTE — ED Triage Notes (Signed)
Pt repots that she is manic and not sleeping and needs help. Reports was seen here yesterday for same complaints but didn't want to go to where she was admitted so left home with her husband. Pt states that she does need help and will stay this time. Denies SI or HI.

## 2019-05-16 NOTE — BH Assessment (Signed)
[facility] Assessment Progress Note Patient was seen yesterday and assessed less than 24 hours ago. Patient declined a inpatient admission that was offered at that time and was discharged. Patient presents back this date stating that her mental health symptoms have not resolved and her mania has actually worsened since she left. Patient is requesting a inpatient admission this date to assist with stabilization. Case was staffed with Shaune Pollack DNP who recommended a inpatient admission to assist with stabilization.

## 2019-05-16 NOTE — Progress Notes (Signed)
Patient meets criteria for inpatient treatment per Nanine Means, NP. No appropriate beds at [facility] currently. CSW faxed referrals to the following facilities for review:  [facility] (CSW spoke with Kennyth Arnold in admissions who reported a new referral would need to be faxed for this pt as bed that had been offered day before has been filled).  [facility]   [facility]  [facility]   [facility]  [facility]  [facility]  [facility]  [facility]   [facility]  [facility]   [facility]    TTS will continue to seek bed placement.     Ruthann Cancer MSW, [facility] Clincal Social Worker Disposition  [facility] Ph: [phone] Fax: [phone] 05/16/2019 2:28 PM

## 2019-05-16 NOTE — ED Provider Notes (Signed)
[facility]-EMERGENCY DEPT Provider Note   CSN: [id number] Arrival date & time: 05/16/19  0944     History Chief Complaint  Patient presents with  . Manic Behavior    Danielle Cox is a 59 y.o. female medical history of anxiety, bipolar disorder, chronic bronchitis, COPD, personality disorder presents to emergency department today with chief complaint of manic behavior. She states she has been taking caffeine pills in addition to her usual medications. She feels like her mind is racing. She endorses feeling depressed for the last several months as well and that the depression is getting worse. She denies feeling suicidal, homicidal, auditory or visual hallucinations, drug or alcohol use. She reports compliance with her medications. Denies any physical complaints.  Chart review shows patient was seen in the ED yesterday for the same. She was here voluntarily and it was recommended she has inpatient admission. She was given a bed in [place] however declined admission because it was too far away. Patient was discharged home with her husband in safe condition. She states while being home she felt her symptoms were worsening and she did actually want to have inpatient admission wherever that may be.   Past Medical History:  Diagnosis Date  . Anxiety   . Bipolar disorder (HCC)   . Bronchitis, chronic (HCC)   . COPD (chronic obstructive pulmonary disease) (HCC)   . Depression   . Personality disorder [facility])     Patient Active Problem List   Diagnosis Date Noted  . Seasonal allergic rhinitis due to pollen 08/24/2018  . Arthralgia of multiple joints 08/24/2018  . Insulin resistance 08/24/2018  . Cervical radiculopathy 12/29/2017  . Lumbar back pain 12/29/2017  . Fibromyalgia 11/21/2017  . COPD (chronic obstructive pulmonary disease) (HCC) 11/21/2017  . Affective psychosis, bipolar (HCC)   . Major depressive disorder, recurrent severe without psychotic features  (HCC) 12/13/2016  . Bipolar 1 disorder, depressed, severe (HCC) 11/20/2016    Class: Chronic  . Personality disorder (HCC) 04/23/2013  . Current every day smoker 02/17/2009  . Essential hypertension 02/17/2009  . Cough 02/17/2009    Past Surgical History:  Procedure Laterality Date  . CARPAL TUNNEL RELEASE Right   . CESAREAN SECTION  1983, 1988, 1992  . TONSILLECTOMY  age 39     OB History   No obstetric history on file.     Family History  Problem Relation Age of Onset  . Depression Mother   . Alcohol abuse Mother   . Depression Father   . Alcohol abuse Father   . Alcohol abuse Sister   . Depression Sister   . Alcohol abuse Brother   . Depression Brother        overdose  . Breast cancer Neg Hx     Social History   Tobacco Use  . Smoking status: Current Every Day Smoker    Packs/day: 1.00    Years: 40.00    Pack years: 40.00    Types: Cigarettes  . Smokeless tobacco: Never Used  Substance Use Topics  . Alcohol use: No  . Drug use: No    Home Medications Prior to Admission medications   Medication Sig Start Date End Date Taking? Authorizing Provider  acetaminophen (TYLENOL) 325 MG tablet Take 1,300 mg by mouth every 6 (six) hours as needed for mild pain or headache.   Yes [provider]  ARIPiprazole (ABILIFY) 10 MG tablet Take 10 mg by mouth every evening.  08/12/18  Yes [provider]  Ascorbic Acid (VITAMIN C CR) 1500 MG TBCR Take 1,500 mg by mouth daily.   Yes [provider]  aspirin 81 MG chewable tablet Chew 81 mg by mouth daily.   Yes [provider]  atorvastatin (LIPITOR) 20 MG tablet TAKE ONE TABLET BY MOUTH DAILY Patient taking differently: Take 20 mg by mouth daily.  05/11/19  Yes Orland Mustard, MD  azelastine (ASTELIN) 0.1 % nasal spray Place 2 sprays into both nostrils 2 (two) times daily. Use in each nostril as directed 08/22/18  Yes [name], DO  Cholecalciferol (D3 MAXIMUM STRENGTH PO) Take 2,500 mg  by mouth daily.   Yes [provider]  clorazepate (TRANXENE) 7.5 MG tablet Take 7.5 mg by mouth daily.  01/22/18  Yes [provider]  Cyanocobalamin (B-12) 2500 MCG TABS Take 2,500 mg by mouth daily.   Yes [provider]  DULoxetine (CYMBALTA) 30 MG capsule Take 90 capsules by mouth daily.  12/17/18  Yes [provider]  furosemide (LASIX) 20 MG tablet TAKE ONE TABLET BY MOUTH DAILY Patient taking differently: Take 20 mg by mouth daily.  04/20/19  Yes [name], MD  IRON, FERROUS SULFATE, PO Take 65 mg by mouth daily.   Yes [provider]  modafinil (PROVIGIL) 200 MG tablet Take 400 mg by mouth daily.    Yes [provider]  montelukast (SINGULAIR) 10 MG tablet TAKE ONE TABLET BY MOUTH EVERY NIGHT AT BEDTIME Patient taking differently: Take 10 mg by mouth daily.  09/24/18  Yes [name], DO  naproxen sodium (ALEVE) 220 MG tablet Take 440-880 mg by mouth 2 (two) times daily as needed (headache/pain).   Yes [provider]  polyvinyl alcohol (LIQUIFILM TEARS) 1.4 % ophthalmic solution Place 1 drop into both eyes as needed for dry eyes.   Yes [provider]  potassium chloride SA (KLOR-CON M20) 20 MEQ tablet TAKE ONE TABLET BY MOUTH EVERY MORNING Patient taking differently: Take 20 mEq by mouth daily. TAKE ONE TABLET BY MOUTH EVERY MORNING 04/27/19  Yes [name], MD  Probiotic Product (PROBIOTIC PO) Take 1 capsule by mouth daily.   Yes [provider]  risperiDONE (RISPERDAL) 1 MG tablet Take 2 mg by mouth daily.  06/16/18  Yes [provider]  rOPINIRole (REQUIP) 2 MG tablet Take 2 mg by mouth daily.  05/01/19  Yes [provider]  traZODone (DESYREL) 150 MG tablet Take 450 mg by mouth daily.  07/16/17  Yes [provider]  meclizine (ANTIVERT) 25 MG tablet TAKE ONE TABLET BY MOUTH EVERY 8 HOURS AS NEEDED FOR DIZZINESS Patient not taking: Reported on 04/14/2019 11/27/18   Willow Ora, MD    Allergies    Moxifloxacin hcl, Penicillins, and Corticosteroids  Review of Systems   Review of Systems All other systems are reviewed and are negative for acute change except as noted in the HPI.  Physical Exam Updated Vital Signs BP (!) 149/72 (BP Location: Right Arm)   Pulse (!) 124   Temp 97.9 F (36.6 C) (Oral)   Resp 15   LMP  (LMP Unknown)   SpO2 97%   Physical Exam Vitals and nursing note reviewed.  Constitutional:      General: She is not in acute distress.    Appearance: She is not ill-appearing.  HENT:     Head: Normocephalic and atraumatic.     Right Ear: Tympanic membrane and external ear normal.     Left Ear: Tympanic  membrane and external ear normal.     Nose: Nose normal.     Mouth/Throat:     Mouth: Mucous membranes are moist.     Pharynx: Oropharynx is clear.  Eyes:     General: No scleral icterus.       Right eye: No discharge.        Left eye: No discharge.     Extraocular Movements: Extraocular movements intact.     Conjunctiva/sclera: Conjunctivae normal.     Pupils: Pupils are equal, round, and reactive to light.  Neck:     Vascular: No JVD.  Cardiovascular:     Rate and Rhythm: Normal rate and regular rhythm.     Pulses: Normal pulses.          Radial pulses are 2+ on the right side and 2+ on the left side.     Heart sounds: Normal heart sounds.  Pulmonary:     Comments: Lungs clear to auscultation in all fields. Symmetric chest rise. No wheezing, rales, or rhonchi. Abdominal:     Comments: Abdomen is soft, non-distended, and non-tender in all quadrants. No rigidity, no guarding. No peritoneal signs.  Musculoskeletal:        General: Normal range of motion.     Cervical back: Normal range of motion.  Skin:    General: Skin is warm and dry.     Capillary Refill: Capillary refill takes less than 2 seconds.  Neurological:     Mental Status: She is oriented to person, place, and time.     GCS: GCS eye subscore is 4. GCS  verbal subscore is 5. GCS motor subscore is 6.     Comments: Fluent speech, no facial droop.  Psychiatric:        Mood and Affect: Mood is anxious. Affect is flat.        Behavior: Behavior normal.     ED Results / Procedures / Treatments   Labs (all labs ordered are listed, but only abnormal results are displayed) Labs Reviewed - No data to display  EKG None  Radiology No results found.  Procedures Procedures (including critical care time)  Medications Ordered in ED Medications - No data to display  ED Course  I have reviewed the triage vital signs and the nursing notes.  Pertinent labs & imaging results that were available during my care of the patient were reviewed by me and considered in my medical decision making (see chart for details).    MDM Rules/Calculators/A&P                      Patient seen and examined. Patient presents awake, alert, hemodynamically stable, afebrile, non toxic.  Noted to have tachycardia to 124 in triage.  She does appear very anxious.  I rechecked heart rate during exam and it ranged from  110-116.  Suspect this is related to anxiety however we will continue to closely monitor. Patient had lab work yesterday.  I viewed results and there were no significant abnormalities. Covid test was negative. Do not feel lab work needs to be repeated. Will consult TTS as patient is agreeable to stay for her placement, even if it is far away.  [facility] recommends inpatient treatment. Social work is actively looking for placement. Home medications and diet ordered. Patient stable at time of dispoition. The patient was discussed with and seen by Dr. Particia Nearing who agrees with the treatment plan.  Portions of this note were generated with Dragon  dictation software. Dictation errors may occur despite best attempts at proofreading.   Final Clinical Impression(s) / ED Diagnoses Final diagnoses:  Depression, unspecified depression type  Manic behavior [facility])    Rx /  DC Orders ED Discharge Orders    None       Flint Melter 05/16/19 1548    Isla Pence, MD 05/17/19 [phone]

## 2019-05-17 DIAGNOSIS — F319 Bipolar disorder, unspecified: Secondary | ICD-10-CM | POA: Diagnosis present

## 2019-05-17 DIAGNOSIS — F3112 Bipolar disorder, current episode manic without psychotic features, moderate: Secondary | ICD-10-CM | POA: Diagnosis not present

## 2019-05-17 HISTORY — DX: Bipolar disorder, unspecified: F31.9

## 2019-05-17 MED ORDER — QUETIAPINE FUMARATE 100 MG PO TABS
100.0000 mg | ORAL_TABLET | Freq: Every day | ORAL | Status: DC
  Administered 2019-05-17: 100 mg via ORAL
  Filled 2019-05-17: qty 1

## 2019-05-17 MED ORDER — QUETIAPINE FUMARATE 25 MG PO TABS
25.0000 mg | ORAL_TABLET | Freq: Three times a day (TID) | ORAL | Status: DC
  Administered 2019-05-17 – 2019-05-18 (×2): 25 mg via ORAL
  Filled 2019-05-17 (×3): qty 1

## 2019-05-17 MED ORDER — NICOTINE 14 MG/24HR TD PT24
14.0000 mg | MEDICATED_PATCH | Freq: Once | TRANSDERMAL | Status: AC
  Administered 2019-05-17: 14 mg via TRANSDERMAL
  Filled 2019-05-17: qty 1

## 2019-05-17 MED ORDER — POLYETHYLENE GLYCOL 3350 17 G PO PACK
17.0000 g | PACK | Freq: Every day | ORAL | Status: DC
  Administered 2019-05-17 – 2019-05-18 (×2): 17 g via ORAL
  Filled 2019-05-17 (×2): qty 1

## 2019-05-17 MED ORDER — CLORAZEPATE DIPOTASSIUM 7.5 MG PO TABS
7.5000 mg | ORAL_TABLET | Freq: Every day | ORAL | Status: DC
  Administered 2019-05-17: 7.5 mg via ORAL
  Filled 2019-05-17: qty 1

## 2019-05-17 NOTE — [facility] Counselor (Signed)
Pt has been accepted to [facility] and can arrive on Monday (05/18/2019) after 1200. She is to arrive to the [facility], where she will be given a COVID test, and she will then be directed to the [facility] B. This information was provided to pt's nurse, Jeremy RN, at 0353.  Room: [facility] Accepting: Dr Raj Thotakura Attending: Dr. Gisela Kohl Call to Report: [phone], ask to be transferred to the nurse on [facility]  Address: [facility] [address] [place], [place] [address] [phone] 

## 2019-05-17 NOTE — ED Notes (Signed)
Pt has been accepted to [name] and can arrive on Monday (05/18/2019) after 1200. She is to arrive to the [facility], where she will be given a COVID test, and she will then be directed to the [facility]. This information was provided to pt's nurse, Leone Haven, at [phone].  Room: [facility] Accepting: Dr Otho Perl Attending: Dr. Mauro Kaufmann Call to Report: [phone], ask to be transferred to the nurse on [facility]  Address: [facility] [address] [place], [place] [address] [phone]
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
## 2019-05-18 ENCOUNTER — Encounter (HOSPITAL_COMMUNITY): Payer: Self-pay | Admitting: Registered Nurse

## 2019-05-18 DIAGNOSIS — R609 Edema, unspecified: Secondary | ICD-10-CM | POA: Diagnosis not present

## 2019-05-18 DIAGNOSIS — F3164 Bipolar disorder, current episode mixed, severe, with psychotic features: Secondary | ICD-10-CM | POA: Diagnosis not present

## 2019-05-18 DIAGNOSIS — F209 Schizophrenia, unspecified: Secondary | ICD-10-CM | POA: Diagnosis not present

## 2019-05-18 DIAGNOSIS — G2581 Restless legs syndrome: Secondary | ICD-10-CM | POA: Diagnosis not present

## 2019-05-18 DIAGNOSIS — J449 Chronic obstructive pulmonary disease, unspecified: Secondary | ICD-10-CM | POA: Diagnosis not present

## 2019-05-18 DIAGNOSIS — M797 Fibromyalgia: Secondary | ICD-10-CM | POA: Diagnosis not present

## 2019-05-18 DIAGNOSIS — F3112 Bipolar disorder, current episode manic without psychotic features, moderate: Secondary | ICD-10-CM | POA: Diagnosis not present

## 2019-05-18 DIAGNOSIS — K5900 Constipation, unspecified: Secondary | ICD-10-CM | POA: Diagnosis not present

## 2019-05-18 DIAGNOSIS — F17210 Nicotine dependence, cigarettes, uncomplicated: Secondary | ICD-10-CM | POA: Diagnosis not present

## 2019-05-18 DIAGNOSIS — Z62810 Personal history of physical and sexual abuse in childhood: Secondary | ICD-10-CM | POA: Diagnosis not present

## 2019-05-18 DIAGNOSIS — J309 Allergic rhinitis, unspecified: Secondary | ICD-10-CM | POA: Diagnosis not present

## 2019-05-18 MED ORDER — ONDANSETRON 4 MG PO TBDP
4.0000 mg | ORAL_TABLET | Freq: Once | ORAL | Status: AC
  Administered 2019-05-18: 4 mg via ORAL
  Filled 2019-05-18: qty 1

## 2019-05-18 MED ORDER — QUETIAPINE FUMARATE 100 MG PO TABS: 100.0000 mg | ORAL_TABLET | Freq: Every day | ORAL | 0 refills | Status: DC

## 2019-05-18 MED ORDER — NAPROXEN SODIUM 275 MG PO TABS: 275.0000 mg | ORAL_TABLET | Freq: Two times a day (BID) | ORAL | 0 refills | Status: DC | PRN

## 2019-05-18 MED ORDER — ACETAMINOPHEN 500 MG PO TABS: 1000.0000 mg | ORAL_TABLET | Freq: Four times a day (QID) | ORAL | 0 refills | Status: DC | PRN

## 2019-05-18 NOTE — BH Assessment (Addendum)
[facility] Assessment Progress Note  Per Nelly Rout, MD, this pt continues to require psychiatric hospitalization at this time.  Pt has been accepted to [name].  For details, please seen note authored by Duard Brady, LMFT on 05/17/2019 at 03:41.  Per Dr Lucianne Muss, pt lacks judgment to voluntarily consent for treatment and she finds that pt meets criteria for IVC, which she has initiated.  IVC documents have been faxed to [facility], and at [facility] confirms receipt.  He has since faxed Findings and Custody Order to this Clinical research associate.  At 10:21 I called [employer] and spoke to Media planner, who took demographic information, agreeing to dispatch law enforcement to fill out Return of Service.  Law enforcement then presented at [facility], completing Return of Service, after which IVC documents were faxed to [name].  Pt is to be transported by [facility].  Pt's nurse, Florentina Addison, has been notified.  Doylene Canning, [place] Behavioral Health Coordinator [phone]

## 2019-05-18 NOTE — Telephone Encounter (Signed)
Patient was seen at ED. FYI 

## 2019-05-18 NOTE — ED Provider Notes (Signed)
Emergency Medicine Observation Re-evaluation Note  Danielle Cox is a 60 y.o. female, seen on rounds today.  Pt initially presented to the ED for complaints of Manic Behavior Currently, the patient is resting comfortably.  Physical Exam  BP (!) 149/71 (BP Location: Left Arm)   Pulse (!) 110   Temp 98.5 F (36.9 C) (Oral)   Resp 19   LMP  (LMP Unknown)   SpO2 97%  Physical Exam Vitals and nursing note reviewed.  Constitutional:      Appearance: She is well-developed.  Cardiovascular:     Rate and Rhythm: Normal rate.  Pulmonary:     Effort: Pulmonary effort is normal.  Abdominal:     General: Bowel sounds are normal.  Musculoskeletal:     Cervical back: Neck supple.  Neurological:     Mental Status: She is alert.     ED Course / MDM  EKG:EKG Interpretation  Date/Time:  Saturday May 16 2019 11:56:26 EDT Ventricular Rate:  102 PR Interval:    QRS Duration: 88 QT Interval:  344 QTC Calculation: 449 R Axis:   95 Text Interpretation: Sinus tachycardia Biatrial enlargement Borderline right axis deviation Nonspecific T abnormalities, lateral leads 12 Lead; [place]ar Since last tracing rate faster Confirmed by Jacalyn Lefevre [phone]) on 05/16/2019 11:59:22 AM    I have reviewed the labs performed to date as well as medications administered while in observation.  Recent changes in the last 24 hours include None from medical perspective.  Psych has recommended some changes in the medications. Plan  Current plan is for await for inpatient admission.    Derwood Kaplan, MD 05/18/19 [phone]

## 2019-05-18 NOTE — Discharge Summary (Signed)
  Patient to be transported to [facility] for inpatient psychiatric treatment

## 2019-05-21 ENCOUNTER — Other Ambulatory Visit: Payer: Self-pay | Admitting: Family Medicine

## 2019-05-22 ENCOUNTER — Other Ambulatory Visit: Payer: Self-pay | Admitting: Family Medicine

## 2019-06-05 DIAGNOSIS — F3111 Bipolar disorder, current episode manic without psychotic features, mild: Secondary | ICD-10-CM | POA: Diagnosis not present

## 2019-06-05 DIAGNOSIS — F419 Anxiety disorder, unspecified: Secondary | ICD-10-CM | POA: Diagnosis not present

## 2019-06-16 ENCOUNTER — Other Ambulatory Visit: Payer: Self-pay | Admitting: Family Medicine

## 2019-06-16 DIAGNOSIS — J301 Allergic rhinitis due to pollen: Secondary | ICD-10-CM

## 2019-06-22 DIAGNOSIS — F3111 Bipolar disorder, current episode manic without psychotic features, mild: Secondary | ICD-10-CM | POA: Diagnosis not present

## 2019-06-30 ENCOUNTER — Ambulatory Visit (INDEPENDENT_AMBULATORY_CARE_PROVIDER_SITE_OTHER): Payer: Medicare PPO | Admitting: Family Medicine

## 2019-06-30 ENCOUNTER — Encounter: Payer: Self-pay | Admitting: Family Medicine

## 2019-06-30 ENCOUNTER — Other Ambulatory Visit: Payer: Self-pay

## 2019-06-30 VITALS — BP 110/76 | HR 81 | Temp 97.4°F | Ht 65.0 in | Wt 158.4 lb

## 2019-06-30 DIAGNOSIS — R350 Frequency of micturition: Secondary | ICD-10-CM | POA: Diagnosis not present

## 2019-06-30 DIAGNOSIS — R300 Dysuria: Secondary | ICD-10-CM

## 2019-06-30 LAB — POCT URINALYSIS DIPSTICK
Bilirubin, UA: NEGATIVE
Blood, UA: NEGATIVE
Glucose, UA: NEGATIVE
Ketones, UA: NEGATIVE
Leukocytes, UA: NEGATIVE
Nitrite, UA: NEGATIVE
Protein, UA: NEGATIVE
Spec Grav, UA: 1.02 (ref 1.010–1.025)
Urobilinogen, UA: 0.2 E.U./dL
pH, UA: 6 (ref 5.0–8.0)

## 2019-06-30 NOTE — Patient Instructions (Signed)
Please follow up if symptoms do not improve or as needed.   Your urine is normal today. You may hold your furosemide and potassium to see if that helps your symptoms. If your selling returns, then you may restart them.

## 2019-06-30 NOTE — Progress Notes (Signed)
Subjective   CC:  Chief Complaint  Patient presents with  . Urinary Frequency    starting 2 weeks ago.   Same day acute visit; PCP not available. New pt to me. Chart reviewed.   HPI: Danielle Cox is a 59 y.o. female who presents to the office today to address the problems listed above in the chief complaint.  Patient reports 2 weeks of urinary frequency.  She denies dysuria or sensation of increased urinary pressure.  She denies fevers flank pain nausea vomiting or gross hematuria.  She denies history of interstitial cystitis.  She denies vaginal symptoms including vaginal discharge or pelvic pain. She is on lasix for swelling; doesn't feel it helps much and she feels it does increase her urination. + nocturia  Assessment  1. Urinary frequency      Plan   Urinary frequency: no sign of UTI today. Can hold lasix and use prn to see if helps. Consider OAB if persists. Will f/u with PCP if needed.   Follow up: Return if symptoms worsen or fail to improve.  Orders Placed This Encounter  Procedures  . Urine Culture  . POCT Urinalysis Dipstick   No orders of the defined types were placed in this encounter.     I reviewed the patients updated PMH, FH, and SocHx.    Patient Active Problem List   Diagnosis Date Noted  . Bipolar affective disorder, currently manic, moderate (HCC) 05/17/2019  . Seasonal allergic rhinitis due to pollen 08/24/2018  . Arthralgia of multiple joints 08/24/2018  . Insulin resistance 08/24/2018  . Cervical radiculopathy 12/29/2017  . Lumbar back pain 12/29/2017  . Fibromyalgia 11/21/2017  . COPD (chronic obstructive pulmonary disease) (HCC) 11/21/2017  . Personality disorder (HCC) 04/23/2013  . Current every day smoker 02/17/2009  . Essential hypertension 02/17/2009  . Cough 02/17/2009   Current Meds  Medication Sig  . acetaminophen (TYLENOL) 500 MG tablet Take 2 tablets (1,000 mg total) by mouth every 6 (six) hours as needed for mild pain or  headache.    Review of Systems: Cardiovascular: negative for chest pain Respiratory: negative for SOB or persistent cough Gastrointestinal: negative for abdominal pain Constitutional: Negative for fever malaise or anorexia  Objective  Vitals: BP 110/76 (BP Location: Right Arm, Patient Position: Sitting, Cuff Size: Normal)   Pulse 81   Temp (!) 97.4 F (36.3 C) (Temporal)   Ht 5\' 5"  (1.651 m)   Wt 158 lb 6.4 oz (71.8 kg)   LMP  (LMP Unknown)   SpO2 98%   BMI 26.36 kg/m  General: no acute distress  Psych:  Alert and oriented, flat mood and affect Cardiovascular:  RRR without murmur or gallop. no peripheral edema Gastrointestinal: soft, flat abdomen, normal active bowel sounds, NO CVAT, no suprapubic ttp w/o rebound or guarding  Office Visit on 06/30/2019  Component Date Value Ref Range Status  . Color, UA 06/30/2019 yellow   Final  . Clarity, UA 06/30/2019 clear   Final  . Glucose, UA 06/30/2019 Negative  Negative Final  . Bilirubin, UA 06/30/2019 negative   Final  . Ketones, UA 06/30/2019 negative   Final  . Spec Grav, UA 06/30/2019 1.020  1.010 - 1.025 Final  . Blood, UA 06/30/2019 negative   Final  . pH, UA 06/30/2019 6.0  5.0 - 8.0 Final  . Protein, UA 06/30/2019 Negative  Negative Final  . Urobilinogen, UA 06/30/2019 0.2  0.2 or 1.0 E.U./dL Final  . Nitrite, UA 07/02/2019 negative  Final  . Leukocytes, UA 06/30/2019 Negative  Negative Final    Commons side effects, risks, benefits, and alternatives for medications and treatment plan prescribed today were discussed, and the patient expressed understanding of the given instructions. Patient is instructed to call or message via MyChart if he/she has any questions or concerns regarding our treatment plan. No barriers to understanding were identified. We discussed Red Flag symptoms and signs in detail. Patient expressed understanding regarding what to do in case of urgent or emergency type symptoms.   Medication list was  reconciled, printed and provided to the patient in AVS. Patient instructions and summary information was reviewed with the patient as documented in the AVS. This note was prepared with assistance of Dragon voice recognition software. Occasional wrong-word or sound-a-like substitutions may have occurred due to the inherent limitations of voice recognition software

## 2019-07-01 LAB — URINE CULTURE
MICRO NUMBER:: 10593087
SPECIMEN QUALITY:: ADEQUATE

## 2019-07-08 DIAGNOSIS — F3132 Bipolar disorder, current episode depressed, moderate: Secondary | ICD-10-CM | POA: Diagnosis not present

## 2019-08-20 DIAGNOSIS — F419 Anxiety disorder, unspecified: Secondary | ICD-10-CM | POA: Diagnosis not present

## 2019-08-20 DIAGNOSIS — F3111 Bipolar disorder, current episode manic without psychotic features, mild: Secondary | ICD-10-CM | POA: Diagnosis not present

## 2019-09-28 ENCOUNTER — Telehealth: Payer: Self-pay

## 2019-09-28 NOTE — Telephone Encounter (Signed)
Spoke with patient. Patient stated has GI appointment Advise if pain continue to go to ER. Patient refused

## 2019-09-28 NOTE — Telephone Encounter (Signed)
Pt called in and was sent to triage for abdominal pains every time she eats that goes to her chest. She has vomited a couple of times. It's been going on for a couple of weeks.She also has visible sweat on face.  Triage nurse told her to call 911. Pt denied and wants to be seen here today. Is that possible ?

## 2019-09-28 NOTE — Telephone Encounter (Signed)
Chief Complaint Chest Pain (non urgent symptoms) Reason for Call Symptomatic / Request for Health Information Initial Comment Caller states that she has been having abdominal pains that go to her chest and she is vomiting as well. Every time she eats she vomits. Translation No Nurse Assessment Nurse: Vear Clock, RN, Elease Hashimoto Date/Time Lamount Cohen Time): 09/28/2019 9:23:30 AM Confirm and document reason for call. If symptomatic, describe symptoms. ---She has been having abdominal pains every time she eats that go to her chest. She has vomited a couple of times. Been going for a couple of weeks. Has the patient had close contact with a person known or suspected to have the novel coronavirus illness OR traveled / lives in area with major community spread (including international travel) in the last 14 days from the onset of symptoms? * If Asymptomatic, screen for exposure and travel within the last 14 days. ---No Does the patient have any new or worsening symptoms? ---Yes Will a triage be completed? ---Yes Related visit to physician within the last 2 weeks? ---No Does the PT have any chronic conditions? (i.e. diabetes, asthma, this includes High risk factors for pregnancy, etc.) ---Yes List chronic conditions. ---bipolar Is this a behavioral health or substance abuse call? ---No Guidelines Guideline Title Affirmed Question Affirmed Notes Nurse Date/Time Lamount Cohen Time) Abdominal Pain - Upper Visible sweat on face or sweat dripping down face Emiliano Dyer 09/28/2019 9:25:46 AM Disp. Time Lamount Cohen Time) Disposition Final User 09/28/2019 9:34:29 AM 911 Outcome Documentation Vear Clock, RN, PatriciaPLEASE NOTE: All timestamps contained within this report are represented as [place] Standard Time. CONFIDENTIALTY NOTICE: This fax transmission is intended only for the addressee. It contains information that is legally privileged, confidential or otherwise protected from use or disclosure. If  you are not the intended recipient, you are strictly prohibited from reviewing, disclosing, copying using or disseminating any of this information or taking any action in reliance on or regarding this information. If you have received this fax in error, please notify us immediately by telephone so that we can arrange for its return to [place]. Phone: [phone], Toll-Free: [phone], Fax: [phone] Page: 2 of 2 Call Id: [id number] Disp. Time Lamount Cohen Time) Disposition Final User Reason: refused 09/28/2019 9:33:59 AM Call EMS 911 Now Yes Vear Clock, RN, Ancil Boozer Disagree/Comply Disagree Caller Understands Yes PreDisposition Call Doctor Care Advice Given Per Guideline CALL EMS 911 NOW: CARE ADVICE given per Abdominal Pain, Upper (Adult) guideline. Comments User: Aviva Kluver, RN Date/Time Lamount Cohen Time): 09/28/2019 9:27:01 AM She feels like she a "bif lump" right below her chest Referrals GO TO FACILITY UNDECIDE

## 2019-10-19 DIAGNOSIS — F332 Major depressive disorder, recurrent severe without psychotic features: Secondary | ICD-10-CM | POA: Diagnosis not present

## 2019-10-19 DIAGNOSIS — F419 Anxiety disorder, unspecified: Secondary | ICD-10-CM | POA: Diagnosis not present

## 2019-11-19 DIAGNOSIS — F419 Anxiety disorder, unspecified: Secondary | ICD-10-CM | POA: Diagnosis not present

## 2019-11-19 DIAGNOSIS — F332 Major depressive disorder, recurrent severe without psychotic features: Secondary | ICD-10-CM | POA: Diagnosis not present

## 2019-11-19 DIAGNOSIS — G2401 Drug induced subacute dyskinesia: Secondary | ICD-10-CM | POA: Diagnosis not present

## 2019-11-26 ENCOUNTER — Other Ambulatory Visit: Payer: Self-pay | Admitting: Family Medicine

## 2019-12-31 ENCOUNTER — Telehealth (INDEPENDENT_AMBULATORY_CARE_PROVIDER_SITE_OTHER): Payer: Medicare PPO | Admitting: Family Medicine

## 2019-12-31 ENCOUNTER — Other Ambulatory Visit: Payer: Self-pay

## 2019-12-31 DIAGNOSIS — R0981 Nasal congestion: Secondary | ICD-10-CM | POA: Diagnosis not present

## 2019-12-31 DIAGNOSIS — J449 Chronic obstructive pulmonary disease, unspecified: Secondary | ICD-10-CM | POA: Diagnosis not present

## 2019-12-31 DIAGNOSIS — R519 Headache, unspecified: Secondary | ICD-10-CM | POA: Diagnosis not present

## 2019-12-31 MED ORDER — DOXYCYCLINE HYCLATE 100 MG PO TABS: 100.0000 mg | ORAL_TABLET | Freq: Two times a day (BID) | ORAL | 0 refills | Status: DC

## 2019-12-31 MED ORDER — ALBUTEROL SULFATE HFA 108 (90 BASE) MCG/ACT IN AERS
2.0000 | INHALATION_SPRAY | Freq: Four times a day (QID) | RESPIRATORY_TRACT | 0 refills | Status: DC | PRN
Start: 2019-12-31 — End: 2020-08-19

## 2019-12-31 NOTE — Progress Notes (Signed)
Virtual Visit via Telephone Note  I connected with Danielle Cox on 12/31/19 at 11:20 AM EST by telephone and verified that I am speaking with the correct person using two identifiers.   I discussed the limitations, risks, security and privacy concerns of performing an evaluation and management service by telephone and the availability of in person appointments. I also discussed with the patient that there may be a patient responsible charge related to this service. The patient expressed understanding and agreed to proceed.  Location patient: home, [place] Location provider: work or home office Participants present for the call: patient, provider Patient did not have a visit with me in the prior 7 days to address this/these issue(s).   History of Present Illness:  Acute telemedicine visit for Sinus issues: -Onset: several weeks ago -Symptoms include: stuffy nose, ears feel, facial sinus discomfort, discolored mucus, she has had some mild bronchitis symptoms at times with this and reports is out of her alb inhaler - is requesting a refill -Denies: cp, NVD, body aches, fevers -Has tried:nothing -Pertinent past medical history: COPD -Pertinent medication allergies: several abx allergies listed - reports has tolerated doxycycline well in the past and can see that this has been prescribed several times by PCP office     Observations/Objective: Patient sounds cheerful and well on the phone. I do not appreciate any SOB. Speech and thought processing are grossly intact. Patient reported vitals:  Assessment and Plan:  Sinus congestion  Facial discomfort  Chronic obstructive pulmonary disease, unspecified COPD type (HCC)  -we discussed possible serious and likely etiologies, options for evaluation and workup, limitations of telemedicine visit vs in person visit, treatment, treatment risks and precautions. Pt prefers to treat via telemedicine empirically rather than in person at this moment.   Query sinusitis given several weeks of sinus congestion, now worsening with thick discolored mucus and facial discomfort.  She has several listed antibiotic allergies, including to moxifloxacin, however reports that she cannot tolerate doxycycline and has on several occasions.  On review of chart, cannot see that this was prescribed a number of times by her PCP office.  She opted to try doxycycline 100 mg twice daily for 7 to 10 days.  She also reports a history of COPD.  Reports symptoms are similar to baseline, however she has run out of her inhaler.  Requests a refill.  Sent refill of albuterol inhaler to use as needed per instructions.  Advised follow-up with PCP in 1 to 2 weeks regarding her chronic disease.  She agrees to schedule. Work/School slipped offered:declined Scheduled follow up with PCP offered:Sent message to schedulers to assist and advised patient to contact PCP office to schedule if does not receive call back in next 24 hours. Advised to seek prompt in person care if worsening, new symptoms arise, or if is not improving with treatment. Advised of options for inperson care in case PCP office not available. Did let the patient know that I only do telemedicine shifts for [place] on Tuesdays and Thursdays and advised a follow up visit with PCP or at an [facility] if has further questions or concerns.   Follow Up Instructions:  I did not refer this patient for an OV with me in the next 24 hours for this/these issue(s).  I discussed the assessment and treatment plan with the patient. The patient was provided an opportunity to ask questions and all were answered. The patient agreed with the plan and demonstrated an understanding of the instructions.   I  spent 16 minutes on this encounter.   Terressa Koyanagi, DO

## 2019-12-31 NOTE — Patient Instructions (Signed)
-  I sent the medication(s) we discussed to your pharmacy: Meds ordered this encounter  Medications   albuterol (PROAIR HFA) 108 (90 Base) MCG/ACT inhaler    Sig: Inhale 2 puffs into the lungs every 6 (six) hours as needed for wheezing or shortness of breath.    Dispense:  1 each    Refill:  0   doxycycline (VIBRA-TABS) 100 MG tablet    Sig: Take 1 tablet (100 mg total) by mouth 2 (two) times daily.    Dispense:  20 tablet    Refill:  0   Follow up with your primary care doctor in about 2 weeks.  I hope you are feeling better soon!  Seek in person care promptly if your symptoms worsen, new concerns arise or you are not improving with treatment.  It was nice to meet you today. I help [facility] out with telemedicine visits on Tuesdays and Thursdays and am available for visits on those days. If you have any concerns or questions following this visit please schedule a follow up visit with your Primary Care doctor or seek care at a local urgent care clinic to avoid delays in care.

## 2020-01-19 ENCOUNTER — Other Ambulatory Visit: Payer: Self-pay

## 2020-01-19 ENCOUNTER — Ambulatory Visit: Payer: Medicare PPO | Admitting: Family Medicine

## 2020-01-19 ENCOUNTER — Encounter: Payer: Self-pay | Admitting: Family Medicine

## 2020-01-19 VITALS — BP 123/78 | HR 95 | Temp 98.3°F | Ht 65.0 in | Wt 188.6 lb

## 2020-01-19 DIAGNOSIS — J449 Chronic obstructive pulmonary disease, unspecified: Secondary | ICD-10-CM | POA: Diagnosis not present

## 2020-01-19 DIAGNOSIS — J329 Chronic sinusitis, unspecified: Secondary | ICD-10-CM | POA: Diagnosis not present

## 2020-01-19 LAB — POCT INFLUENZA A/B
Influenza A, POC: NEGATIVE
Influenza B, POC: NEGATIVE

## 2020-01-19 MED ORDER — AZELASTINE HCL 0.1 % NA SOLN
2.0000 | Freq: Two times a day (BID) | NASAL | 12 refills | Status: DC
Start: 2020-01-19 — End: 2021-04-18

## 2020-01-19 MED ORDER — AZITHROMYCIN 250 MG PO TABS: ORAL_TABLET | ORAL | 0 refills | Status: DC

## 2020-01-19 MED ORDER — PREDNISONE 50 MG PO TABS: ORAL_TABLET | ORAL | 0 refills | Status: DC

## 2020-01-19 MED ORDER — BUDESONIDE-FORMOTEROL FUMARATE 80-4.5 MCG/ACT IN AERO
2.0000 | INHALATION_SPRAY | Freq: Two times a day (BID) | RESPIRATORY_TRACT | 3 refills | Status: DC
Start: 2020-01-19 — End: 2021-04-26

## 2020-01-19 NOTE — Progress Notes (Signed)
   Danielle Cox is a 60 y.o. female who presents today for an office visit.  Assessment/Plan:  New/Acute Problems: Sinusitis We will check flu and Covid.  She did not respond to doxycycline.  Will start azithromycin and Astelin.  If no improvement after this will consider referral to ENT or CT sinus. Discussed reasons to return to care.   Chronic Problems Addressed Today: COPD (chronic obstructive pulmonary disease) (HCC) Flare due to recent URI.  We will continue Singulair 10 mg daily.  Will start Symbicort.  Will treat flare with azithromycin and prednisone.     Subjective:  HPI:   Patient here with several weeks of sinus congestion rhinorrhea and increased shortness of breath.  Had virtual visit about 3 weeks ago.  Started doxycycline.  Symptoms do improve.  She has not been tested for Covid or flu.  No fevers or chills.  Some cough.  No other specific treatments tried.       Objective:  Physical Exam: BP 123/78   Pulse 95   Temp 98.3 F (36.8 C) (Temporal)   Ht 5\' 5"  (1.651 m)   Wt 188 lb 9.6 oz (85.5 kg)   LMP  (LMP Unknown)   SpO2 95%   BMI 31.38 kg/m   Gen: No acute distress, resting comfortably CV: Regular rate and rhythm with no murmurs appreciated Pulm: Normal work of breathing, clear to auscultation bilaterally with no crackles, wheezes, or rhonchi Neuro: Grossly normal, moves all extremities Psych: Normal affect and thought content      Joleah Kosak M. , MD 01/19/2020 3:02 PM

## 2020-01-19 NOTE — Addendum Note (Signed)
Addended by: Dyann Kief on: 01/19/2020 03:23 PM   Modules accepted: Orders

## 2020-01-19 NOTE — Patient Instructions (Signed)
It was very nice to see you today!  We will check her for flu and Covid.  Please start the Z-Pak and prednisone.  Also send in a nasal spray and an inhaler.  Please let us know if your symptoms are not improving by next week.  Take care, Dr [name]  Please try these tips to maintain a healthy lifestyle:   Eat at least 3 REAL meals and 1-2 snacks per day.  Aim for no more than 5 hours between eating.  If you eat breakfast, please do so within one hour of getting up.    Each meal should contain half fruits/vegetables, one quarter protein, and one quarter carbs (no bigger than a computer mouse)   Cut down on sweet beverages. This includes juice, soda, and sweet tea.     Drink at least 1 glass of water with each meal and aim for at least 8 glasses per day   Exercise at least 150 minutes every week.

## 2020-01-19 NOTE — Assessment & Plan Note (Signed)
Flare due to recent URI.  We will continue Singulair 10 mg daily.  Will start Symbicort.  Will treat flare with azithromycin and prednisone.

## 2020-01-21 LAB — SARS-COV-2, NAA 2 DAY TAT

## 2020-01-21 LAB — NOVEL CORONAVIRUS, NAA: SARS-CoV-2, NAA: NOT DETECTED

## 2020-01-25 NOTE — Progress Notes (Signed)
Please inform patient of the following:  Labs are all negative. Would like for her to let us know if her symptoms are not improving.  Katina Degree. [name], MD 01/25/2020 10:34 AM

## 2020-01-27 ENCOUNTER — Telehealth: Payer: Self-pay | Admitting: Family Medicine

## 2020-01-27 NOTE — Telephone Encounter (Signed)
Left message for patient to call back and schedule Medicare Annual Wellness Visit (AWV) either virtually OR in office.   Last AWV 01/22/19; please schedule at anytime with [facility]Nurse Health Advisor at [facility].  This should be a 45 minute visit.   

## 2020-02-17 ENCOUNTER — Telehealth (INDEPENDENT_AMBULATORY_CARE_PROVIDER_SITE_OTHER): Payer: Medicare PPO | Admitting: Family Medicine

## 2020-02-17 ENCOUNTER — Encounter: Payer: Self-pay | Admitting: Family Medicine

## 2020-02-17 VITALS — Ht 65.0 in | Wt 180.0 lb

## 2020-02-17 DIAGNOSIS — R197 Diarrhea, unspecified: Secondary | ICD-10-CM | POA: Diagnosis not present

## 2020-02-17 DIAGNOSIS — J449 Chronic obstructive pulmonary disease, unspecified: Secondary | ICD-10-CM | POA: Diagnosis not present

## 2020-02-17 NOTE — Assessment & Plan Note (Addendum)
Still not controlled.  Hopefully will have some improvement as we treat sinus infection as above.  She is on Singulair and Symbicort.

## 2020-02-17 NOTE — Progress Notes (Signed)
   Danielle Cox is a 60 y.o. female who presents today for a virtual office visit.  Assessment/Plan:  New/Acute Problems: Diarrhea She has been on numerous antibiotics recently.  Concern at diarrhea may be associated with recent antibiotic use.  Given the length of her symptoms in addition to frequency of bowel movements will check GI pathogen panel.  Recommended she use Imodium as needed.  Recommended increase fiber intake and probiotic supplementation as well.  Discussed reasons return to care.  Sinusitis Still ongoing.  Offered referral to ENT or CT scan however she deferred for now until her diarrhea resolves.   Chronic Problems Addressed Today: COPD (chronic obstructive pulmonary disease) (HCC) Still not controlled.  Hopefully will have some improvement as we treat sinus infection as above.  She is on Singulair and Symbicort.     Subjective:  HPI:  Patient with diarrhea for the last month.  She has been averaging 6-7 bowel movements per day.  For the first several weeks diarrhea was watery and loose.  She has noticed more loose and soft bowel movements over the last week or so.  She has already had 4 bowel movements this morning.  Mild generalized abdominal pain.  Mild nausea.  She has been trying bland foods with no improvement.  No vomiting.  No fevers or chills.  She is still dealing with sinus congestion for the last 4 to 6 weeks as well.  She was on several trials of antibiotics previously including doxycycline and azithromycin.  Symptoms have persisted.       Objective/Observations  Physical Exam: Gen: NAD, resting comfortably Pulm: Normal work of breathing Neuro: Grossly normal, moves all extremities Psych: Normal affect and thought content  Virtual Visit via Video   I connected with Danielle Cox on 02/17/20 at 10:00 AM EST by a video enabled telemedicine application and verified that I am speaking with the correct person using two identifiers. The limitations of  evaluation and management by telemedicine and the availability of in person appointments were discussed. The patient expressed understanding and agreed to proceed.   Patient location: Home Provider location: [place] [employer] Persons participating in the virtual visit: Myself and Patient     Katina Degree. [name], MD 02/17/2020 10:39 AM

## 2020-02-19 ENCOUNTER — Other Ambulatory Visit: Payer: Self-pay | Admitting: *Deleted

## 2020-02-19 ENCOUNTER — Other Ambulatory Visit: Payer: Self-pay

## 2020-02-19 ENCOUNTER — Other Ambulatory Visit: Payer: Self-pay | Admitting: Family Medicine

## 2020-02-19 ENCOUNTER — Other Ambulatory Visit (INDEPENDENT_AMBULATORY_CARE_PROVIDER_SITE_OTHER): Payer: Medicare PPO

## 2020-02-19 DIAGNOSIS — R197 Diarrhea, unspecified: Secondary | ICD-10-CM

## 2020-02-19 DIAGNOSIS — K921 Melena: Secondary | ICD-10-CM

## 2020-02-19 LAB — FECAL OCCULT BLOOD, IMMUNOCHEMICAL: Fecal Occult Bld: POSITIVE — AB

## 2020-02-19 NOTE — Progress Notes (Signed)
Please inform patient of the following:  She had blood in her stool This is probably due to the diarrhea and inflammation but recommend GI evaluation. Please place referral.  Aseneth Hack M. [name], MD 02/19/2020 2:16 PM

## 2020-02-19 NOTE — Progress Notes (Signed)
GI

## 2020-02-24 DIAGNOSIS — G2401 Drug induced subacute dyskinesia: Secondary | ICD-10-CM | POA: Diagnosis not present

## 2020-02-24 DIAGNOSIS — F419 Anxiety disorder, unspecified: Secondary | ICD-10-CM | POA: Diagnosis not present

## 2020-02-24 DIAGNOSIS — F315 Bipolar disorder, current episode depressed, severe, with psychotic features: Secondary | ICD-10-CM | POA: Diagnosis not present

## 2020-03-24 ENCOUNTER — Telehealth: Payer: Self-pay

## 2020-03-24 DIAGNOSIS — Z86010 Personal history of colonic polyps: Secondary | ICD-10-CM | POA: Diagnosis not present

## 2020-03-24 DIAGNOSIS — R195 Other fecal abnormalities: Secondary | ICD-10-CM | POA: Diagnosis not present

## 2020-03-24 DIAGNOSIS — R1013 Epigastric pain: Secondary | ICD-10-CM | POA: Diagnosis not present

## 2020-03-24 NOTE — Telephone Encounter (Signed)
Please advise 

## 2020-03-24 NOTE — Telephone Encounter (Signed)
Patient was seen at digestive health regarding a stool sample she did with [place] and there was blood in urine and they would like there to get follow up lab work protein INR - CBC- COMPREHENSIVE METABOLIC PANEL.  Can we put orders in for these tests to get them done for her  patient leave for [facility] tomorrow and will not be back until the week of the 21st

## 2020-03-25 NOTE — Telephone Encounter (Signed)
Recommend office visit.  Danielle Cox. [name], MD 03/25/2020 3:57 PM

## 2020-03-28 NOTE — Telephone Encounter (Signed)
Patient scheduled.

## 2020-04-05 ENCOUNTER — Other Ambulatory Visit: Payer: Self-pay

## 2020-04-05 ENCOUNTER — Ambulatory Visit: Payer: Medicare PPO | Admitting: Family Medicine

## 2020-04-05 ENCOUNTER — Encounter: Payer: Self-pay | Admitting: Family Medicine

## 2020-04-05 VITALS — BP 139/80 | HR 81 | Temp 98.5°F | Ht 65.0 in | Wt 188.2 lb

## 2020-04-05 DIAGNOSIS — K922 Gastrointestinal hemorrhage, unspecified: Secondary | ICD-10-CM | POA: Diagnosis not present

## 2020-04-05 DIAGNOSIS — I10 Essential (primary) hypertension: Secondary | ICD-10-CM

## 2020-04-05 DIAGNOSIS — E8881 Metabolic syndrome: Secondary | ICD-10-CM

## 2020-04-05 DIAGNOSIS — F3112 Bipolar disorder, current episode manic without psychotic features, moderate: Secondary | ICD-10-CM

## 2020-04-05 LAB — CBC WITH DIFFERENTIAL/PLATELET
Basophils Absolute: 0.1 10*3/uL (ref 0.0–0.1)
Basophils Relative: 0.6 % (ref 0.0–3.0)
Eosinophils Absolute: 0.1 10*3/uL (ref 0.0–0.7)
Eosinophils Relative: 1 % (ref 0.0–5.0)
HCT: 45.4 % (ref 36.0–46.0)
Hemoglobin: 15.5 g/dL — ABNORMAL HIGH (ref 12.0–15.0)
Lymphocytes Relative: 19 % (ref 12.0–46.0)
Lymphs Abs: 1.5 10*3/uL (ref 0.7–4.0)
MCHC: 34.1 g/dL (ref 30.0–36.0)
MCV: 91.8 fl (ref 78.0–100.0)
Monocytes Absolute: 0.5 10*3/uL (ref 0.1–1.0)
Monocytes Relative: 6.1 % (ref 3.0–12.0)
Neutro Abs: 5.8 10*3/uL (ref 1.4–7.7)
Neutrophils Relative %: 73.3 % (ref 43.0–77.0)
Platelets: 248 10*3/uL (ref 150.0–400.0)
RBC: 4.95 Mil/uL (ref 3.87–5.11)
RDW: 13.6 % (ref 11.5–15.5)
WBC: 7.9 10*3/uL (ref 4.0–10.5)

## 2020-04-05 LAB — COMPREHENSIVE METABOLIC PANEL
ALT: 24 U/L (ref 0–35)
AST: 15 U/L (ref 0–37)
Albumin: 4.4 g/dL (ref 3.5–5.2)
Alkaline Phosphatase: 72 U/L (ref 39–117)
BUN: 13 mg/dL (ref 6–23)
CO2: 28 mEq/L (ref 19–32)
Calcium: 9.5 mg/dL (ref 8.4–10.5)
Chloride: 107 mEq/L (ref 96–112)
Creatinine, Ser: 0.69 mg/dL (ref 0.40–1.20)
GFR: 96.07 mL/min (ref >60.00)
Glucose, Bld: 124 mg/dL — ABNORMAL HIGH (ref 70–99)
Potassium: 3.8 mEq/L (ref 3.5–5.1)
Sodium: 143 mEq/L (ref 135–145)
Total Bilirubin: 0.3 mg/dL (ref 0.2–1.2)
Total Protein: 6.5 g/dL (ref 6.0–8.3)

## 2020-04-05 LAB — PROTIME-INR
INR: 1 ratio (ref 0.8–1.0)
Prothrombin Time: 11.7 s (ref 9.6–13.1)

## 2020-04-05 LAB — HEMOGLOBIN A1C: Hgb A1c MFr Bld: 6 % (ref 4.6–6.5)

## 2020-04-05 LAB — TSH: TSH: 0.75 u[IU]/mL (ref 0.35–4.50)

## 2020-04-05 NOTE — Progress Notes (Signed)
   Danielle Cox is a 60 y.o. female who presents today for an office visit.  Assessment/Plan:  Chronic Problems Addressed Today: Essential hypertension At goal off meds.  Check labs.  Gastrointestinal hemorrhage Following with GI for this.  Will be undergoing EGD and colonoscopy at some point in the next couple of weeks.  Will check requested labs today including CBC with differential, CMET, and INR.  Bipolar affective disorder, currently manic, moderate (HCC) Managed by psychiatry.  She is on Tranxene 7.5 mg daily, Seroquel 100 mg nightly, trazodone 150 mg nightly, Cymbalta 90 mg daily, and hydroxyzine 25 mg 3 times daily as needed she is having some shakiness and she is not sure if this is due to a side effect of her meds or if it is due to her stress surrounding her GI work-up.  She will follow-up with psychiatrist and let me know if symptoms persist.  Check TSH today.  Insulin resistance Check A1c.    Subjective:  HPI:  See A/p.        Objective:  Physical Exam: BP 139/80   Pulse 81   Temp 98.5 F (36.9 C) (Temporal)   Ht 5\' 5"  (1.651 m)   Wt 188 lb 3.2 oz (85.4 kg)   LMP  (LMP Unknown)   SpO2 96%   BMI 31.32 kg/m   Gen: No acute distress, resting comfortablyegular rate and rhythm with no murmurs appreciated Pulm: Normal work of breathing, clear to auscultation bilaterally with no crackles, wheezes, or rhonchi Neuro: Grossly normal, moves all extremities Psych: Normal affect and thought content      Timmy Bubeck M. , MD 04/05/2020 11:48 AM

## 2020-04-05 NOTE — Assessment & Plan Note (Signed)
Following with GI for this.  Will be undergoing EGD and colonoscopy at some point in the next couple of weeks.  Will check requested labs today including CBC with differential, CMET, and INR.

## 2020-04-05 NOTE — Assessment & Plan Note (Addendum)
Managed by psychiatry.  She is on Tranxene 7.5 mg daily, Seroquel 100 mg nightly, trazodone 150 mg nightly, Cymbalta 90 mg daily, and hydroxyzine 25 mg 3 times daily as needed she is having some shakiness and she is not sure if this is due to a side effect of her meds or if it is due to her stress surrounding her GI work-up.  She will follow-up with psychiatrist and let me know if symptoms persist.  Check TSH today.

## 2020-04-05 NOTE — Assessment & Plan Note (Signed)
At goal off meds.  Check labs. 

## 2020-04-05 NOTE — Patient Instructions (Addendum)
It was very nice to see you today!  We will check blood work today.  These results should be back within the next day or so.  Please follow-up with your psychiatrist regarding her medications soon.  Let us know if you continue to have issues with shakiness.  I diskette ago  Take care, Dr [name]  PLEASE NOTE:  If you had any lab tests please let us know if you have not heard back within a few days. You may see your results on mychart before we have a chance to review them but we will give you a call once they are reviewed by [place]. If we ordered any referrals today, please let us know if you have not heard from their office within the next week.   Please try these tips to maintain a healthy lifestyle:   Eat at least 3 REAL meals and 1-2 snacks per day.  Aim for no more than 5 hours between eating.  If you eat breakfast, please do so within one hour of getting up.    Each meal should contain half fruits/vegetables, one quarter protein, and one quarter carbs (no bigger than a computer mouse)   Cut down on sweet beverages. This includes juice, soda, and sweet tea.     Drink at least 1 glass of water with each meal and aim for at least 8 glasses per day   Exercise at least 150 minutes every week.

## 2020-04-05 NOTE — Assessment & Plan Note (Signed)
Check A1c. 

## 2020-04-07 NOTE — Progress Notes (Signed)
Please inform patient of the following:  Labs all stable. Ok for her to get procedure from GI.  Katina Degree. [name], MD 04/07/2020 12:56 PM

## 2020-05-02 DIAGNOSIS — K3189 Other diseases of stomach and duodenum: Secondary | ICD-10-CM | POA: Diagnosis not present

## 2020-05-02 DIAGNOSIS — Z6832 Body mass index (BMI) 32.0-32.9, adult: Secondary | ICD-10-CM | POA: Diagnosis not present

## 2020-05-02 DIAGNOSIS — K319 Disease of stomach and duodenum, unspecified: Secondary | ICD-10-CM | POA: Diagnosis not present

## 2020-05-02 DIAGNOSIS — R1013 Epigastric pain: Secondary | ICD-10-CM | POA: Diagnosis not present

## 2020-05-02 DIAGNOSIS — D122 Benign neoplasm of ascending colon: Secondary | ICD-10-CM | POA: Diagnosis not present

## 2020-05-02 DIAGNOSIS — F319 Bipolar disorder, unspecified: Secondary | ICD-10-CM | POA: Diagnosis not present

## 2020-05-02 DIAGNOSIS — K2970 Gastritis, unspecified, without bleeding: Secondary | ICD-10-CM | POA: Diagnosis not present

## 2020-05-02 DIAGNOSIS — D120 Benign neoplasm of cecum: Secondary | ICD-10-CM | POA: Diagnosis not present

## 2020-05-02 DIAGNOSIS — R195 Other fecal abnormalities: Secondary | ICD-10-CM | POA: Diagnosis not present

## 2020-05-02 DIAGNOSIS — J449 Chronic obstructive pulmonary disease, unspecified: Secondary | ICD-10-CM | POA: Diagnosis not present

## 2020-05-02 DIAGNOSIS — F17210 Nicotine dependence, cigarettes, uncomplicated: Secondary | ICD-10-CM | POA: Diagnosis not present

## 2020-05-02 DIAGNOSIS — M797 Fibromyalgia: Secondary | ICD-10-CM | POA: Diagnosis not present

## 2020-05-02 DIAGNOSIS — K635 Polyp of colon: Secondary | ICD-10-CM | POA: Diagnosis not present

## 2020-05-02 DIAGNOSIS — D123 Benign neoplasm of transverse colon: Secondary | ICD-10-CM | POA: Diagnosis not present

## 2020-05-02 DIAGNOSIS — E669 Obesity, unspecified: Secondary | ICD-10-CM | POA: Diagnosis not present

## 2020-05-02 DIAGNOSIS — Z79899 Other long term (current) drug therapy: Secondary | ICD-10-CM | POA: Diagnosis not present

## 2020-05-02 DIAGNOSIS — F17200 Nicotine dependence, unspecified, uncomplicated: Secondary | ICD-10-CM | POA: Diagnosis not present

## 2020-05-02 DIAGNOSIS — Z86010 Personal history of colonic polyps: Secondary | ICD-10-CM | POA: Diagnosis not present

## 2020-05-02 HISTORY — DX: Benign neoplasm of ascending colon: D12.2

## 2020-05-02 LAB — HM COLONOSCOPY

## 2020-05-11 ENCOUNTER — Encounter: Payer: Self-pay | Admitting: Family Medicine

## 2020-06-07 ENCOUNTER — Other Ambulatory Visit: Payer: Self-pay

## 2020-06-07 ENCOUNTER — Ambulatory Visit: Payer: Medicare PPO | Admitting: Family

## 2020-06-07 ENCOUNTER — Encounter: Payer: Self-pay | Admitting: Family

## 2020-06-07 VITALS — BP 143/83 | HR 93 | Temp 98.1°F | Ht 65.0 in | Wt 183.0 lb

## 2020-06-07 DIAGNOSIS — R300 Dysuria: Secondary | ICD-10-CM | POA: Diagnosis not present

## 2020-06-07 LAB — POCT URINALYSIS DIPSTICK
Bilirubin, UA: NEGATIVE
Blood, UA: POSITIVE
Glucose, UA: NEGATIVE
Ketones, UA: NEGATIVE
Nitrite, UA: NEGATIVE
Protein, UA: NEGATIVE
Spec Grav, UA: 1.02 (ref 1.010–1.025)
Urobilinogen, UA: 0.2 E.U./dL
pH, UA: 6 (ref 5.0–8.0)

## 2020-06-07 MED ORDER — NITROFURANTOIN MONOHYD MACRO 100 MG PO CAPS: 100.0000 mg | ORAL_CAPSULE | Freq: Two times a day (BID) | ORAL | 0 refills | Status: DC

## 2020-06-07 NOTE — Addendum Note (Signed)
Addended by: Vincenza Hews on: 06/07/2020 02:11 PM   Modules accepted: Orders

## 2020-06-07 NOTE — Patient Instructions (Signed)
Urinary Tract Infection, Adult A urinary tract infection (UTI) is an infection of any part of the urinary tract. The urinary tract includes:  The kidneys.  The ureters.  The bladder.  The urethra. These organs make, store, and get rid of pee (urine) in the body. What are the causes? This infection is caused by germs (bacteria) in your genital area. These germs grow and cause swelling (inflammation) of your urinary tract. What increases the risk? The following factors may make you more likely to develop this condition:  Using a small, thin tube (catheter) to drain pee.  Not being able to control when you pee or poop (incontinence).  Being female. If you are female, these things can increase the risk: ? Using these methods to prevent pregnancy:  A medicine that kills sperm (spermicide).  A device that blocks sperm (diaphragm). ? Having low levels of a female hormone (estrogen). ? Being pregnant. You are more likely to develop this condition if:  You have genes that add to your risk.  You are sexually active.  You take antibiotic medicines.  You have trouble peeing because of: ? A prostate that is bigger than normal, if you are female. ? A blockage in the part of your body that drains pee from the bladder. ? A kidney stone. ? A nerve condition that affects your bladder. ? Not getting enough to drink. ? Not peeing often enough.  You have other conditions, such as: ? Diabetes. ? A weak disease-fighting system (immune system). ? Sickle cell disease. ? Gout. ? Injury of the spine. What are the signs or symptoms? Symptoms of this condition include:  Needing to pee right away.  Peeing small amounts often.  Pain or burning when peeing.  Blood in the pee.  Pee that smells bad or not like normal.  Trouble peeing.  Pee that is cloudy.  Fluid coming from the vagina, if you are female.  Pain in the belly or lower back. Other symptoms include:  Vomiting.  Not  feeling hungry.  Feeling mixed up (confused). This may be the first symptom in older adults.  Being tired and grouchy (irritable).  A fever.  Watery poop (diarrhea). How is this treated?  Taking antibiotic medicine.  Taking other medicines.  Drinking enough water. In some cases, you may need to see a specialist. Follow these instructions at home: Medicines  Take over-the-counter and prescription medicines only as told by your doctor.  If you were prescribed an antibiotic medicine, take it as told by your doctor. Do not stop taking it even if you start to feel better. General instructions  Make sure you: ? Pee until your bladder is empty. ? Do not hold pee for a long time. ? Empty your bladder after sex. ? Wipe from front to back after peeing or pooping if you are a female. Use each tissue one time when you wipe.  Drink enough fluid to keep your pee pale yellow.  Keep all follow-up visits.   Contact a doctor if:  You do not get better after 1-2 days.  Your symptoms go away and then come back. Get help right away if:  You have very bad back pain.  You have very bad pain in your lower belly.  You have a fever.  You have chills.  You feeling like you will vomit or you vomit. Summary  A urinary tract infection (UTI) is an infection of any part of the urinary tract.  This condition is caused by   germs in your genital area.  There are many risk factors for a UTI.  Treatment includes antibiotic medicines.  Drink enough fluid to keep your pee pale yellow. This information is not intended to replace advice given to you by your health care provider. Make sure you discuss any questions you have with your health care provider. Document Revised: 08/14/2019 Document Reviewed: 08/14/2019 Elsevier Patient Education  2021 Elsevier Inc.  

## 2020-06-07 NOTE — Progress Notes (Signed)
Acute Office Visit  Subjective:    Patient ID: Danielle Cox, female    DOB: 31-Jul-1960, 60 y.o.   MRN: [id number]  Chief Complaint  Patient presents with  . Dysuria    Symptoms started 1 week ago.  . Back Pain  . Urinary Frequency    HPI Patient is in today with complaints of urinary frequency, urgency, burning with urination, lower abdominal pain x1 week.  Denies any blood in her urine.  Denies any fever or chills.  Has been taken Azo that has helped  Past Medical History:  Diagnosis Date  . Anxiety   . Bipolar disorder (HCC)   . Bronchitis, chronic (HCC)   . COPD (chronic obstructive pulmonary disease) (HCC)   . Depression   . Personality disorder [facility])     Past Surgical History:  Procedure Laterality Date  . CARPAL TUNNEL RELEASE Right   . CESAREAN SECTION  1983, 1988, 1992  . TONSILLECTOMY  age 7    Family History  Problem Relation Age of Onset  . Depression Mother   . Alcohol abuse Mother   . Depression Father   . Alcohol abuse Father   . Alcohol abuse Sister   . Depression Sister   . Alcohol abuse Brother   . Depression Brother        overdose  . Breast cancer Neg Hx     Social History   Socioeconomic History  . Marital status: Married    Spouse name: Onalee Hua  . Number of children: 3  . Years of education: Not on file  . Highest education level: GED or equivalent  Occupational History  . Occupation: disabled  Tobacco Use  . Smoking status: Current Every Day Smoker    Packs/day: 1.00    Years: 40.00    Pack years: 40.00    Types: Cigarettes  . Smokeless tobacco: Never Used  Vaping Use  . Vaping Use: Never used  Substance and Sexual Activity  . Alcohol use: No  . Drug use: No  . Sexual activity: Not Currently    Partners: Male    Birth control/protection: None  Other Topics Concern  . Not on file  Social History Narrative   10/29/2012 [name] Raseel was born and grew up in [place], [place]. She has 2 brothers and one sister. She reports that  her childhood was "poor," and explains that she needs both financially and emotionally. She completed the 10th grade, then achieved her GED. She has been married twice. The first time at age 23, and that ended after 1 year. She is currently married to her second husband of 27 years. She has 2 sons and one daughter. She is currently unemployed and on disability for 2 years. She lives with her husband, her daughter, and one of her daughter's friends. She denies any legal difficulties. She reports that she is spiritual but not religious. Her hobbies include reading and cross stitch. She reports that she has no social support network. 10/29/2012 [name]      Patient is right-handed.she lives with her husband ina one story home with a few steps to enter. She drinks 2 cups of tea a day. She is active around the home.   Social Determinants of Health   Financial Resource Strain: Not on file  Food Insecurity: Not on file  Transportation Needs: Not on file  Physical Activity: Not on file  Stress: Not on file  Social Connections: Not on file  Intimate Partner Violence: Not on file  Outpatient Medications Prior to Visit  Medication Sig Dispense Refill  . acetaminophen (TYLENOL) 500 MG tablet Take 2 tablets (1,000 mg total) by mouth every 6 (six) hours as needed for mild pain or headache. 30 tablet 0  . albuterol (PROAIR HFA) 108 (90 Base) MCG/ACT inhaler Inhale 2 puffs into the lungs every 6 (six) hours as needed for wheezing or shortness of breath. 1 each 0  . ARIPiprazole (ABILIFY) 10 MG tablet Take 10 mg by mouth every evening.     Marland Kitchen aspirin 81 MG chewable tablet Chew 81 mg by mouth daily.    Marland Kitchen atorvastatin (LIPITOR) 20 MG tablet TAKE ONE TABLET BY MOUTH DAILY 90 tablet 3  . azelastine (ASTELIN) 0.1 % nasal spray Place 2 sprays into both nostrils 2 (two) times daily. 30 mL 12  . budesonide-formoterol (SYMBICORT) 80-4.5 MCG/ACT inhaler Inhale 2 puffs into the lungs 2 (two) times daily. 1 each 3  .  clorazepate (TRANXENE) 7.5 MG tablet Take 7.5 mg by mouth daily.    . Cyanocobalamin (B-12) 2500 MCG TABS Take 2,500 mg by mouth daily.    Marland Kitchen dicyclomine (BENTYL) 10 MG capsule Take 10 mg by mouth 4 (four) times daily -  before meals and at bedtime.    . DULoxetine (CYMBALTA) 30 MG capsule Take 90 mg by mouth daily.    . hydrOXYzine (ATARAX/VISTARIL) 25 MG tablet Take 25 mg by mouth 3 (three) times daily as needed.    Marland Kitchen omeprazole (PRILOSEC) 20 MG capsule Take by mouth.    . QUEtiapine (SEROQUEL) 100 MG tablet Take 1 tablet (100 mg total) by mouth at bedtime. 30 tablet 0  . traZODone (DESYREL) 150 MG tablet Take by mouth at bedtime.     No facility-administered medications prior to visit.    Allergies  Allergen Reactions  . Moxifloxacin Hcl Palpitations    REACTION: tackycardia  . Penicillins Rash    Has patient had a PCN reaction causing immediate rash, facial/tongue/throat swelling, SOB or lightheadedness with hypotension: Yes Has patient had a PCN reaction causing severe rash involving mucus membranes or skin necrosis: No Has patient had a PCN reaction that required hospitalization: No Has patient had a PCN reaction occurring within the last 10 years: No If all of the above answers are "NO", then may proceed with Cephalosporin use.   . Corticosteroids Other (See Comments)    Mania    Review of Systems  Constitutional: Negative.   Respiratory: Negative.   Cardiovascular: Negative.   Genitourinary: Positive for dysuria, frequency and urgency.  Musculoskeletal: Negative.   Skin: Negative.   Allergic/Immunologic: Negative.   Psychiatric/Behavioral: Negative.   All other systems reviewed and are negative.      Objective:    Physical Exam Vitals and nursing note reviewed.  Constitutional:      Appearance: Normal appearance.  Cardiovascular:     Rate and Rhythm: Normal rate and regular rhythm.     Pulses: Normal pulses.     Heart sounds: Normal heart sounds.  Pulmonary:      Effort: Pulmonary effort is normal.     Breath sounds: Normal breath sounds.  Abdominal:     General: Abdomen is flat.     Palpations: Abdomen is soft.     Tenderness: There is abdominal tenderness.     Comments: Pelvic tenderness  Musculoskeletal:        General: Normal range of motion.     Cervical back: Normal range of motion and neck supple.  Skin:  General: Skin is warm and dry.  Neurological:     General: No focal deficit present.     Mental Status: She is alert and oriented to person, place, and time.  Psychiatric:        Mood and Affect: Mood normal.        Behavior: Behavior normal.     BP (!) 143/83   Pulse 93   Temp 98.1 F (36.7 C) (Temporal)   Ht 5\' 5"  (1.651 m)   Wt 183 lb (83 kg)   LMP  (LMP Unknown)   SpO2 96%   BMI 30.45 kg/m  Wt Readings from Last 3 Encounters:  06/07/20 183 lb (83 kg)  04/05/20 188 lb 3.2 oz (85.4 kg)  02/17/20 180 lb (81.6 kg)    Health Maintenance Due  Topic Date Due  . COVID-19 Vaccine (3 - Booster for Moderna series) 11/04/2019    There are no preventive care reminders to display for this patient.   Lab Results  Component Value Date   TSH 0.75 04/05/2020   Lab Results  Component Value Date   WBC 7.9 04/05/2020   HGB 15.5 (H) 04/05/2020   HCT 45.4 04/05/2020   MCV 91.8 04/05/2020   PLT 248.0 04/05/2020   Lab Results  Component Value Date   NA 143 04/05/2020   K 3.8 04/05/2020   CO2 28 04/05/2020   GLUCOSE 124 (H) 04/05/2020   BUN 13 04/05/2020   CREATININE 0.69 04/05/2020   BILITOT 0.3 04/05/2020   ALKPHOS 72 04/05/2020   AST 15 04/05/2020   ALT 24 04/05/2020   PROT 6.5 04/05/2020   ALBUMIN 4.4 04/05/2020   CALCIUM 9.5 04/05/2020   ANIONGAP 7 05/15/2019   GFR 96.07 04/05/2020   Lab Results  Component Value Date   CHOL 181 08/20/2018   Lab Results  Component Value Date   HDL 46.40 08/20/2018   Lab Results  Component Value Date   LDLCALC 104 (H) 08/20/2018   Lab Results  Component Value  Date   TRIG 153.0 (H) 08/20/2018   Lab Results  Component Value Date   CHOLHDL 4 08/20/2018   Lab Results  Component Value Date   HGBA1C 6.0 04/05/2020       Assessment & Plan:   Problem List Items Addressed This Visit   None   Visit Diagnoses    Dysuria    -  Primary   Relevant Orders   POCT Urinalysis Dipstick   Urine Culture       Meds ordered this encounter  Medications  . nitrofurantoin, macrocrystal-monohydrate, (MACROBID) 100 MG capsule    Sig: Take 1 capsule (100 mg total) by mouth 2 (two) times daily.    Dispense:  14 capsule    Refill:  0    Awaiting patient to leave a urine sample.  However, based upon her symptoms we will treat with Macrobid twice a day.  Drink plenty of fluids.  Call the office if symptoms worsen or persist. 04/07/2020, FNP

## 2020-06-08 ENCOUNTER — Telehealth: Payer: Self-pay

## 2020-06-08 NOTE — Telephone Encounter (Signed)
Patient called in stating she looked at results on line and seen abnormal urine culture. Wondering what the next steps are. Did advise patient that no one has resulted them so she will have to wait for a phone call in return.

## 2020-06-09 LAB — URINE CULTURE
MICRO NUMBER:: 11928253
SPECIMEN QUALITY:: ADEQUATE

## 2020-06-09 NOTE — Telephone Encounter (Signed)
Please advise 

## 2020-06-10 NOTE — Telephone Encounter (Signed)
I spoke with the pt to give message below. She voiced understanding, and will pick up medication.

## 2020-06-10 NOTE — Telephone Encounter (Signed)
Lvm for pt to call the office back to make pt aware.

## 2020-06-16 ENCOUNTER — Other Ambulatory Visit: Payer: Self-pay | Admitting: Family

## 2020-06-16 ENCOUNTER — Telehealth: Payer: Self-pay

## 2020-06-16 NOTE — Telephone Encounter (Signed)
Patient states she has finished nitrofurantoin, macrocrystal-monohydrate, (MACROBID) 100 MG capsule And is still burning when uritating can we send im something else to help

## 2020-06-16 NOTE — Telephone Encounter (Signed)
Looks like she saw pandonda for this.  Danielle Cox. [name], MD 06/16/2020 1:01 PM

## 2020-06-16 NOTE — Telephone Encounter (Signed)
Please advise 

## 2020-06-16 NOTE — Telephone Encounter (Signed)
Danielle Cox, pt finished the Macrobid Rx but is still c/o burning with urination. Saw pt on 5/25. Please advise.

## 2020-06-17 ENCOUNTER — Other Ambulatory Visit: Payer: Self-pay | Admitting: Family

## 2020-06-17 MED ORDER — CIPROFLOXACIN HCL 250 MG PO TABS: 250.0000 mg | ORAL_TABLET | Freq: Two times a day (BID) | ORAL | 0 refills | Status: AC

## 2020-06-17 NOTE — Telephone Encounter (Signed)
Spoke to pt told her Danielle Cox is Sending in Cipro antibiotic to the pharmacy.If symptoms persist let us know,will refer to urology. Pt verbalized understanding.

## 2020-06-29 ENCOUNTER — Encounter: Payer: Self-pay | Admitting: Family Medicine

## 2020-07-28 DIAGNOSIS — G2581 Restless legs syndrome: Secondary | ICD-10-CM | POA: Diagnosis not present

## 2020-07-28 DIAGNOSIS — F315 Bipolar disorder, current episode depressed, severe, with psychotic features: Secondary | ICD-10-CM | POA: Diagnosis not present

## 2020-07-28 DIAGNOSIS — G2401 Drug induced subacute dyskinesia: Secondary | ICD-10-CM | POA: Diagnosis not present

## 2020-07-28 DIAGNOSIS — F419 Anxiety disorder, unspecified: Secondary | ICD-10-CM | POA: Diagnosis not present

## 2020-08-19 ENCOUNTER — Ambulatory Visit (INDEPENDENT_AMBULATORY_CARE_PROVIDER_SITE_OTHER): Payer: Medicare PPO

## 2020-08-19 DIAGNOSIS — Z0000 Encounter for general adult medical examination without abnormal findings: Secondary | ICD-10-CM | POA: Diagnosis not present

## 2020-08-19 NOTE — Patient Instructions (Signed)
Danielle Cox , Thank you for taking time to come for your Medicare Wellness Visit. I appreciate your ongoing commitment to your health goals. Please review the following plan we discussed and let me know if I can assist you in the future.   Screening recommendations/referrals: Colonoscopy: Cologuard  done 12/02/17 repeat every 3 years 12/02/20 Mammogram: Discontinued  Recommended yearly ophthalmology/optometry visit for glaucoma screening and checkup Recommended yearly dental visit for hygiene and checkup  Vaccinations: Influenza vaccine: Declined and discussed  Pneumococcal vaccine: Declined and discussed  Tdap vaccine: Due and discussed Shingles vaccine: Shingrix discussed. Please contact your pharmacy for coverage information.   Covid-19: Completed 4/22, & 06/04/19  Advanced directives: Please bring a copy of your health care power of attorney and living will to the office at your convenience.  Conditions/risks identified: None at this time  Next appointment: Follow up in one year for your annual wellness visit.   Preventive Care 40-64 Years, Female Preventive care refers to lifestyle choices and visits with your health care provider that can promote health and wellness. What does preventive care include? A yearly physical exam. This is also called an annual well check. Dental exams once or twice a year. Routine eye exams. Ask your health care provider how often you should have your eyes checked. Personal lifestyle choices, including: Daily care of your teeth and gums. Regular physical activity. Eating a healthy diet. Avoiding tobacco and drug use. Limiting alcohol use. Practicing safe sex. Taking low-dose aspirin daily starting at age 81. Taking vitamin and mineral supplements as recommended by your health care provider. What happens during an annual well check? The services and screenings done by your health care provider during your annual well check will depend on your age,  overall health, lifestyle risk factors, and family history of disease. Counseling  Your health care provider may ask you questions about your: Alcohol use. Tobacco use. Drug use. Emotional well-being. Home and relationship well-being. Sexual activity. Eating habits. Work and work Statistician. Method of birth control. Menstrual cycle. Pregnancy history. Screening  You may have the following tests or measurements: Height, weight, and BMI. Blood pressure. Lipid and cholesterol levels. These may be checked every 5 years, or more frequently if you are over 60 years old. Skin check. Lung cancer screening. You may have this screening every year starting at age 37 if you have a 30-pack-year history of smoking and currently smoke or have quit within the past 15 years. Fecal occult blood test (FOBT) of the stool. You may have this test every year starting at age 28. Flexible sigmoidoscopy or colonoscopy. You may have a sigmoidoscopy every 5 years or a colonoscopy every 10 years starting at age 50. Hepatitis C blood test. Hepatitis B blood test. Sexually transmitted disease (STD) testing. Diabetes screening. This is done by checking your blood sugar (glucose) after you have not eaten for a while (fasting). You may have this done every 1-3 years. Mammogram. This may be done every 1-2 years. Talk to your health care provider about when you should start having regular mammograms. This may depend on whether you have a family history of breast cancer. BRCA-related cancer screening. This may be done if you have a family history of breast, ovarian, tubal, or peritoneal cancers. Pelvic exam and Pap test. This may be done every 3 years starting at age 38. Starting at age 48, this may be done every 5 years if you have a Pap test in combination with an HPV test. Bone density  scan. This is done to screen for osteoporosis. You may have this scan if you are at high risk for osteoporosis. Discuss your test  results, treatment options, and if necessary, the need for more tests with your health care provider. Vaccines  Your health care provider may recommend certain vaccines, such as: Influenza vaccine. This is recommended every year. Tetanus, diphtheria, and acellular pertussis (Tdap, Td) vaccine. You may need a Td booster every 10 years. Zoster vaccine. You may need this after age 76. Pneumococcal 13-valent conjugate (PCV13) vaccine. You may need this if you have certain conditions and were not previously vaccinated. Pneumococcal polysaccharide (PPSV23) vaccine. You may need one or two doses if you smoke cigarettes or if you have certain conditions. Talk to your health care provider about which screenings and vaccines you need and how often you need them. This information is not intended to replace advice given to you by your health care provider. Make sure you discuss any questions you have with your health care provider. Document Released: 01/28/2015 Document Revised: 09/21/2015 Document Reviewed: 11/02/2014 Elsevier Interactive Patient Education  2017 [place] Prevention in the Home Falls can cause injuries. They can happen to people of all ages. There are many things you can do to make your home safe and to help prevent falls. What can I do on the outside of my home? Regularly fix the edges of walkways and driveways and fix any cracks. Remove anything that might make you trip as you walk through a door, such as a raised step or threshold. Trim any bushes or trees on the path to your home. Use bright outdoor lighting. Clear any walking paths of anything that might make someone trip, such as rocks or tools. Regularly check to see if handrails are loose or broken. Make sure that both sides of any steps have handrails. Any raised decks and porches should have guardrails on the edges. Have any leaves, snow, or ice cleared regularly. Use sand or salt on walking paths during  winter. Clean up any spills in your garage right away. This includes oil or grease spills. What can I do in the bathroom? Use night lights. Install grab bars by the toilet and in the tub and shower. Do not use towel bars as grab bars. Use non-skid mats or decals in the tub or shower. If you need to sit down in the shower, use a plastic, non-slip stool. Keep the floor dry. Clean up any water that spills on the floor as soon as it happens. Remove soap buildup in the tub or shower regularly. Attach bath mats securely with double-sided non-slip rug tape. Do not have throw rugs and other things on the floor that can make you trip. What can I do in the bedroom? Use night lights. Make sure that you have a light by your bed that is easy to reach. Do not use any sheets or blankets that are too big for your bed. They should not hang down onto the floor. Have a firm chair that has side arms. You can use this for support while you get dressed. Do not have throw rugs and other things on the floor that can make you trip. What can I do in the kitchen? Clean up any spills right away. Avoid walking on wet floors. Keep items that you use a lot in easy-to-reach places. If you need to reach something above you, use a strong step stool that has a grab bar. Keep electrical  cords out of the way. Do not use floor polish or wax that makes floors slippery. If you must use wax, use non-skid floor wax. Do not have throw rugs and other things on the floor that can make you trip. What can I do with my stairs? Do not leave any items on the stairs. Make sure that there are handrails on both sides of the stairs and use them. Fix handrails that are broken or loose. Make sure that handrails are as long as the stairways. Check any carpeting to make sure that it is firmly attached to the stairs. Fix any carpet that is loose or worn. Avoid having throw rugs at the top or bottom of the stairs. If you do have throw rugs,  attach them to the floor with carpet tape. Make sure that you have a light switch at the top of the stairs and the bottom of the stairs. If you do not have them, ask someone to add them for you. What else can I do to help prevent falls? Wear shoes that: Do not have high heels. Have rubber bottoms. Are comfortable and fit you well. Are closed at the toe. Do not wear sandals. If you use a stepladder: Make sure that it is fully opened. Do not climb a closed stepladder. Make sure that both sides of the stepladder are locked into place. Ask someone to hold it for you, if possible. Clearly mark and make sure that you can see: Any grab bars or handrails. First and last steps. Where the edge of each step is. Use tools that help you move around (mobility aids) if they are needed. These include: Canes. Walkers. Scooters. Crutches. Turn on the lights when you go into a dark area. Replace any light bulbs as soon as they burn out. Set up your furniture so you have a clear path. Avoid moving your furniture around. If any of your floors are uneven, fix them. If there are any pets around you, be aware of where they are. Review your medicines with your doctor. Some medicines can make you feel dizzy. This can increase your chance of falling. Ask your doctor what other things that you can do to help prevent falls. This information is not intended to replace advice given to you by your health care provider. Make sure you discuss any questions you have with your health care provider. Document Released: 10/28/2008 Document Revised: 06/09/2015 Document Reviewed: 02/05/2014 Elsevier Interactive Patient Education  2017 [employer].

## 2020-08-19 NOTE — Progress Notes (Addendum)
Virtual Visit via Telephone Note  I connected with  Danielle Cox on 08/19/20 at  2:30 PM EDT by telephone and verified that I am speaking with the correct person using two identifiers.  Medicare Annual Wellness visit completed telephonically due to Covid-19 pandemic.   Persons participating in this call: This Health Coach and this patient.   Location: Patient: Home Provider: Office   I discussed the limitations, risks, security and privacy concerns of performing an evaluation and management service by telephone and the availability of in person appointments. The patient expressed understanding and agreed to proceed.  Unable to perform video visit due to video visit attempted and failed and/or patient does not have video capability.   Some vital signs may be absent or patient reported.   Danielle Schlein, LPN   Subjective:   Danielle Cox is a 60 y.o. female who presents for Medicare Annual (Subsequent) preventive examination.  Review of Systems     Cardiac Risk Factors include: hypertension;dyslipidemia;obesity (BMI >30kg/m2)     Objective:    Today's Vitals   08/19/20 1430  PainSc: 7    There is no height or weight on file to calculate BMI.  Advanced Directives 08/19/2020 05/16/2019 01/22/2019 12/13/2016 12/12/2016  Does Patient Have a Medical Advance Directive? Yes No No No No  Type of Advance Directive Healthcare Power of Attorney - - - -  Copy of Healthcare Power of Attorney in Chart? No - copy requested - - - -  Would patient like information on creating a medical advance directive? - - Yes (MAU/Ambulatory/Procedural Areas - Information given) No - Patient declined No - Patient declined  Some encounter information is confidential and restricted. Go to Review Flowsheets activity to see all data.    Current Medications (verified) Outpatient Encounter Medications as of 08/19/2020  Medication Sig   acetaminophen (TYLENOL) 500 MG tablet Take 2 tablets (1,000 mg total) by  mouth every 6 (six) hours as needed for mild pain or headache.   ARIPiprazole (ABILIFY) 10 MG tablet Take 10 mg by mouth in the morning and at bedtime.   azelastine (ASTELIN) 0.1 % nasal spray Place 2 sprays into both nostrils 2 (two) times daily.   budesonide-formoterol (SYMBICORT) 80-4.5 MCG/ACT inhaler Inhale 2 puffs into the lungs 2 (two) times daily.   dicyclomine (BENTYL) 20 MG tablet Take 20 mg by mouth in the morning and at bedtime.   hydrOXYzine (ATARAX/VISTARIL) 25 MG tablet Take 25 mg by mouth 3 (three) times daily as needed.   modafinil (PROVIGIL) 200 MG tablet Take by mouth in the morning and at bedtime.   omeprazole (PRILOSEC) 20 MG capsule Take 20 mg by mouth daily.   rOPINIRole (REQUIP) 4 MG tablet Take by mouth in the morning and at bedtime.   TEMAZEPAM PO Take by mouth.   traZODone (DESYREL) 150 MG tablet Take by mouth at bedtime.   valbenazine (INGREZZA) 80 MG capsule Take by mouth.   [DISCONTINUED] albuterol (PROAIR HFA) 108 (90 Base) MCG/ACT inhaler Inhale 2 puffs into the lungs every 6 (six) hours as needed for wheezing or shortness of breath.   [DISCONTINUED] aspirin 81 MG chewable tablet Chew 81 mg by mouth daily. (Patient not taking: Reported on 08/19/2020)   [DISCONTINUED] atorvastatin (LIPITOR) 20 MG tablet TAKE ONE TABLET BY MOUTH DAILY (Patient not taking: Reported on 08/19/2020)   [DISCONTINUED] clorazepate (TRANXENE) 7.5 MG tablet Take 7.5 mg by mouth daily. (Patient not taking: Reported on 08/19/2020)   [DISCONTINUED] Cyanocobalamin (B-12) 2500  MCG TABS Take 2,500 mg by mouth daily. (Patient not taking: Reported on 08/19/2020)   [DISCONTINUED] dicyclomine (BENTYL) 10 MG capsule Take 10 mg by mouth 4 (four) times daily -  before meals and at bedtime. (Patient not taking: Reported on 08/19/2020)   [DISCONTINUED] DULoxetine (CYMBALTA) 30 MG capsule Take 90 mg by mouth daily. (Patient not taking: Reported on 08/19/2020)   [DISCONTINUED] nitrofurantoin, macrocrystal-monohydrate,  (MACROBID) 100 MG capsule Take 1 capsule (100 mg total) by mouth 2 (two) times daily. (Patient not taking: Reported on 08/19/2020)   [DISCONTINUED] omeprazole (PRILOSEC) 20 MG capsule Take by mouth. (Patient not taking: Reported on 08/19/2020)   [DISCONTINUED] QUEtiapine (SEROQUEL) 100 MG tablet Take 1 tablet (100 mg total) by mouth at bedtime. (Patient not taking: Reported on 08/19/2020)   No facility-administered encounter medications on file as of 08/19/2020.    Allergies (verified) Moxifloxacin hcl, Penicillins, and Corticosteroids   History: Past Medical History:  Diagnosis Date   Anxiety    Bipolar disorder (HCC)    Bronchitis, chronic (HCC)    COPD (chronic obstructive pulmonary disease) (HCC)    Depression    Personality disorder (HCC)    Past Surgical History:  Procedure Laterality Date   CARPAL TUNNEL RELEASE Right    CESAREAN SECTION  19, 89, 65   TONSILLECTOMY  age 10   Family History  Problem Relation Age of Onset   Depression Mother    Alcohol abuse Mother    Depression Father    Alcohol abuse Father    Alcohol abuse Sister    Depression Sister    Alcohol abuse Brother    Depression Brother        overdose   Breast cancer Neg Hx    Social History   Socioeconomic History   Marital status: Married    Spouse name: Danielle Hua   Number of children: 3   Years of education: Not on file   Highest education level: GED or equivalent  Occupational History   Occupation: disabled  Tobacco Use   Smoking status: Every Day    Packs/day: 1.00    Years: 40.00    Pack years: 40.00    Types: Cigarettes   Smokeless tobacco: Never  Vaping Use   Vaping Use: Never used  Substance and Sexual Activity   Alcohol use: No   Drug use: No   Sexual activity: Not Currently    Partners: Male    Birth control/protection: None  Other Topics Concern   Not on file  Social History Narrative   10/29/2012 [name] Danielle Cox was born and grew up in [place], [place]. She has 2 brothers and one  sister. She reports that her childhood was "poor," and explains that she needs both financially and emotionally. She completed the 10th grade, then achieved her GED. She has been married twice. The first time at age 36, and that ended after 1 year. She is currently married to her second husband of 27 years. She has 2 sons and one daughter. She is currently unemployed and on disability for 2 years. She lives with her husband, her daughter, and one of her daughter's friends. She denies any legal difficulties. She reports that she is spiritual but not religious. Her hobbies include reading and cross stitch. She reports that she has no social support network. 10/29/2012 [name]      Patient is right-handed.she lives with her husband ina one story home with a few steps to enter. She drinks 2 cups of tea a day. She  is active around the home.   Social Determinants of Health   Financial Resource Strain: Low Risk    Difficulty of Paying Living Expenses: Not hard at all  Food Insecurity: No Food Insecurity   Worried About Programme researcher, broadcasting/film/video in the Last Year: Never true   Ran Out of Food in the Last Year: Never true  Transportation Needs: No Transportation Needs   Lack of Transportation (Medical): No   Lack of Transportation (Non-Medical): No  Physical Activity: Inactive   Days of Exercise per Week: 0 days   Minutes of Exercise per Session: 0 min  Stress: No Stress Concern Present   Feeling of Stress : Only a little  Social Connections: Moderately Isolated   Frequency of Communication with Friends and Family: More than three times a week   Frequency of Social Gatherings with Friends and Family: Once a week   Attends Religious Services: Never   Database administrator or Organizations: No   Attends Engineer, structural: Never   Marital Status: Married    Tobacco Counseling Ready to quit: Not Answered Counseling given: Not Answered   Clinical Intake:  Pre-visit preparation completed:  Yes  Pain : 0-10 Pain Score: 7  Pain Type: Chronic pain Pain Location: Head (back neck and shoulders) Pain Descriptors / Indicators: Sharp, Aching Pain Onset: More than a month ago Pain Frequency: Constant     BMI - recorded: 30.45 Nutritional Status: BMI > 30  Obese Nutritional Risks: Nausea/ vomitting/ diarrhea Diabetes: No  How often do you need to have someone help you when you read instructions, pamphlets, or other written materials from your doctor or pharmacy?: 1 - Never  Diabetic?No  Interpreter Needed?: No  Information entered by :: Lanier Ensign, LPN   Activities of Daily Living In your present state of health, do you have any difficulty performing the following activities: 08/19/2020  Hearing? N  Vision? N  Difficulty concentrating or making decisions? Y  Comment at times  Walking or climbing stairs? Y  Comment go really slow  Dressing or bathing? N  Doing errands, shopping? N  Preparing Food and eating ? N  Using the Toilet? N  In the past six months, have you accidently leaked urine? N  Do you have problems with loss of bowel control? N  Managing your Medications? N  Managing your Finances? N  Housekeeping or managing your Housekeeping? N  Some recent data might be hidden    Patient Care Team: [name], MD as PCP - General (Family Medicine) Andrena Mews, DO as Consulting Physician (Family Medicine) Lesle Reek, DDS (Dental General Practice) Ellis Savage, NP as Nurse Practitioner Drema Dallas, DO as Consulting Physician (Neurology) [facility], [facility] (Pain Medicine) Archer Asa, MD as Consulting Physician (Psychiatry) Ardell Isaacs, MD as Consulting Physician (Pain Medicine)  Indicate any recent Medical Services you may have received from other than [facility] providers in the past year (date may be approximate).     Assessment:   This is a routine wellness examination for Danielle Cox.  Hearing/Vision screen Hearing  Screening - Comments:: Pt denies any hearing issues  Vision Screening - Comments:: Pt follows up with [facility] for annual eye exams   Dietary issues and exercise activities discussed: Current Exercise Habits: The patient does not participate in regular exercise at present   Goals Addressed             This Visit's Progress  Patient Stated       None at this time       Depression Screen PHQ 2/9 Scores 08/19/2020 04/14/2019 01/22/2019 08/13/2018 09/18/2017 03/02/2016  PHQ - 2 Score 1 0 - 4 4 -  PHQ- 9 Score - 1 - 11 10 -  Exception Documentation - - (No Data) - - Other- indicate reason in comment box  Not completed - - - - - FOLLOWED BY PYSCH FOR DEPRESSION, SEVERE   Some encounter information is confidential and restricted. Go to Review Flowsheets activity to see all data.    Fall Risk Fall Risk  08/19/2020 06/30/2019 01/22/2019 08/22/2018 08/13/2018  Falls in the past year? 0 1 0 0 0  Number falls in past yr: 0 0 - 0 0  Injury with Fall? 0 1 0 0 0  Risk for fall due to : Impaired vision;Impaired balance/gait;Impaired mobility - - - -  Follow up Falls prevention discussed - Education provided;Falls prevention discussed;Falls evaluation completed - -    FALL RISK PREVENTION PERTAINING TO THE HOME:  Any stairs in or around the home? Yes  If so, are there any without handrails? No  Home free of loose throw rugs in walkways, pet beds, electrical cords, etc? Yes  Adequate lighting in your home to reduce risk of falls? Yes   ASSISTIVE DEVICES UTILIZED TO PREVENT FALLS:  Life alert? No  Use of a cane, walker or w/c? No  Grab bars in the bathroom? No  Shower chair or bench in shower? Yes  Elevated toilet seat or a handicapped toilet? No   TIMED UP AND GO:  Was the test performed? No .  Cognitive Function:     6CIT Screen 08/19/2020  What Year? 0 points  What month? 0 points  What time? 0 points  Count back from 20 0 points  Months in reverse 4 points  Repeat phrase 6  points  Total Score 10    Immunizations Immunization History  Administered Date(s) Administered   Moderna Sars-Covid-2 Vaccination 05/07/2019, 06/04/2019    TDAP status : due and discussed  Flu Vaccine status: Declined, Education has been provided regarding the importance of this vaccine but patient still declined. Advised may receive this vaccine at local pharmacy or Health Dept. Aware to provide a copy of the vaccination record if obtained from local pharmacy or Health Dept. Verbalized acceptance and understanding.  Pneumococcal vaccine status: Declined,  Education has been provided regarding the importance of this vaccine but patient still declined. Advised may receive this vaccine at local pharmacy or Health Dept. Aware to provide a copy of the vaccination record if obtained from local pharmacy or Health Dept. Verbalized acceptance and understanding.   Covid-19 vaccine status: Completed vaccines  Qualifies for Shingles Vaccine? Yes   Zostavax completed No   Shingrix Completed?: No.    Education has been provided regarding the importance of this vaccine. Patient has been advised to call insurance company to determine out of pocket expense if they have not yet received this vaccine. Advised may also receive vaccine at local pharmacy or Health Dept. Verbalized acceptance and understanding.  Screening Tests Health Maintenance  Topic Date Due   COVID-19 Vaccine (3 - Booster for Moderna series) 11/04/2019   Zoster Vaccines- Shingrix (1 of 2) 11/19/2020 (Originally 06/09/2012)   TETANUS/TDAP  02/16/2021 (Originally [birth year]/09/1981)   Pneumococcal Vaccine 57-82 Years old (1 - PCV) 08/19/2021 (Originally 06/09/1966)   INFLUENZA VACCINE  02/18/2024 (Originally 08/15/2020)   Fecal DNA (Cologuard)  12/02/2020  PAP SMEAR-Modifier  03/02/2021   Hepatitis C Screening  Completed   HIV Screening  Completed   HPV VACCINES  Aged Out   MAMMOGRAM  Discontinued    Health Maintenance  Health Maintenance  Due  Topic Date Due   COVID-19 Vaccine (3 - Booster for Moderna series) 11/04/2019    Colorectal cancer screening: Type of screening: Cologuard. Completed 12/02/17. Repeat every 3 years  Mammogram status: No longer required due to discontinued .     Additional Screening:  Hepatitis C Screening:  Completed 02/11/18  Vision Screening: Recommended annual ophthalmology exams for early detection of glaucoma and other disorders of the eye. Is the patient up to date with their annual eye exam?  Yes  Who is the provider or what is the name of the office in which the patient attends annual eye exams? [facility]  If pt is not established with a provider, would they like to be referred to a provider to establish care? No .   Dental Screening: Recommended annual dental exams for proper oral hygiene  Community Resource Referral / Chronic Care Management: CRR required this visit?  No   CCM required this visit?  No      Plan:     I have personally reviewed and noted the following in the patient's chart:   Medical and social history Use of alcohol, tobacco or illicit drugs  Current medications and supplements including opioid prescriptions.  Functional ability and status Nutritional status Physical activity Advanced directives List of other physicians Hospitalizations, surgeries, and ER visits in previous 12 months Vitals Screenings to include cognitive, depression, and falls Referrals and appointments  In addition, I have reviewed and discussed with patient certain preventive protocols, quality metrics, and best practice recommendations. A written personalized care plan for preventive services as well as general preventive health recommendations were provided to patient.     Danielle Schleinina H Areeb Corron, LPN   2/8/41328/05/2020   Nurse Notes: None

## 2020-09-22 ENCOUNTER — Encounter: Payer: Self-pay | Admitting: Family Medicine

## 2020-09-22 ENCOUNTER — Telehealth (INDEPENDENT_AMBULATORY_CARE_PROVIDER_SITE_OTHER): Payer: Medicare PPO | Admitting: Family Medicine

## 2020-09-22 VITALS — Ht 65.0 in | Wt 185.0 lb

## 2020-09-22 DIAGNOSIS — F3112 Bipolar disorder, current episode manic without psychotic features, moderate: Secondary | ICD-10-CM

## 2020-09-22 DIAGNOSIS — M5412 Radiculopathy, cervical region: Secondary | ICD-10-CM | POA: Diagnosis not present

## 2020-09-22 MED ORDER — MODAFINIL 200 MG PO TABS: 200.0000 mg | ORAL_TABLET | Freq: Two times a day (BID) | ORAL | 0 refills | Status: DC

## 2020-09-22 NOTE — Assessment & Plan Note (Signed)
She is having several issues with upper back and low neck pain.  She did have MRI done a couple of years ago which showed degenerative disc disease as well as disc herniation.  We discussed referral to neurosurgery or sports medicine however she declined for now.  She will let me know once she wants referral.

## 2020-09-22 NOTE — Assessment & Plan Note (Signed)
She is currently managed by psychiatry it is unfortunately been off of the Provigil for the last week and a half.  We will send emergency prescription for 7 days sent to her pharmacy.  She will continue regimen otherwise per psychiatry.

## 2020-09-22 NOTE — Progress Notes (Signed)
   Danielle Cox is a 60 y.o. female who presents today for a virtual office visit.  Assessment/Plan:  Chronic Problems Addressed Today: Bipolar affective disorder, currently manic, moderate (HCC) She is currently managed by psychiatry it is unfortunately been off of the Provigil for the last week and a half.  We will send emergency prescription for 7 days sent to her pharmacy.  She will continue regimen otherwise per psychiatry.  Cervical radiculopathy She is having several issues with upper back and low neck pain.  She did have MRI done a couple of years ago which showed degenerative disc disease as well as disc herniation.  We discussed referral to neurosurgery or sports medicine however she declined for now.  She will let me know once she wants referral.     Subjective:  HPI:  See A/P for status of chronic conditions.  She has been out of Provigil for about a week.  This was normally prescribed by her psychiatrist however she states that she has not received the prescription yet.  This was sent to mail order pharmacy but she is not sure if there was a backlog with the prescription never went through.  She contacted her office earlier this week but has not heard anything back yet.  She is feeling more shaky and jittery off the medication.  Feels like she is having some withdrawal effects.       Objective/Observations  Physical Exam: Gen: NAD, resting comfortably Pulm: Normal work of breathing Neuro: Grossly normal, moves all extremities Psych: Normal affect and thought content  Virtual Visit via Video   I connected with [name] on 09/22/20 at 10:40 AM EDT by a video enabled telemedicine application and verified that I am speaking with the correct person using two identifiers. The limitations of evaluation and management by telemedicine and the availability of in person appointments were discussed. The patient expressed understanding and agreed to proceed.   Patient  location: Home Provider location: [place] [employer] Persons participating in the virtual visit: Myself and Patient     Katina Degree. [name], MD 09/22/2020 11:01 AM

## 2020-09-23 DIAGNOSIS — H5203 Hypermetropia, bilateral: Secondary | ICD-10-CM | POA: Diagnosis not present

## 2020-09-30 ENCOUNTER — Telehealth: Payer: Self-pay

## 2020-09-30 DIAGNOSIS — M549 Dorsalgia, unspecified: Secondary | ICD-10-CM

## 2020-09-30 NOTE — Telephone Encounter (Signed)
Pt called about back pain. She stated that if her back did not get any better, Dr [name] would refer her to a sports medicine doctor. Can the referral be placed? Please Advise.

## 2020-09-30 NOTE — Telephone Encounter (Signed)
Referral has been placed. 

## 2020-10-03 ENCOUNTER — Ambulatory Visit: Payer: Medicare PPO | Admitting: Sports Medicine

## 2020-10-03 ENCOUNTER — Other Ambulatory Visit: Payer: Self-pay

## 2020-10-03 VITALS — BP 138/76 | HR 103 | Ht 65.0 in | Wt 185.0 lb

## 2020-10-03 DIAGNOSIS — M797 Fibromyalgia: Secondary | ICD-10-CM

## 2020-10-03 DIAGNOSIS — M549 Dorsalgia, unspecified: Secondary | ICD-10-CM | POA: Diagnosis not present

## 2020-10-03 DIAGNOSIS — M419 Scoliosis, unspecified: Secondary | ICD-10-CM

## 2020-10-03 NOTE — Progress Notes (Signed)
Aleen Sells D.[facility] [address] [place] [address] Phone: [phone]   Assessment and Plan:     1. Scoliosis of thoracic spine, unspecified scoliosis type 2. Mid back pain on right side -Chronic, unchanged, initial sports medicine visit - Likely chronic mid back pain localized to right side with underlying scoliosis of thoracic spine as seen on multiple chest x-rays without gross compromise of lungs or heart - Start HEP focused on posterior chain, paraspinal muscle strengthening to help compensate for scoliosis - Offered PT which patient declines at this time.  Would offer at follow-up visit if no improvement or worsening of symptoms - May use Tylenol/NSAIDs as needed for pain control  3. Fibromyalgia -Chronic, unchanged, initial sports medicine visit - Patient's symptoms and chronic pain consistent with fibromyalgia - Patient may benefit from duloxetine, however with patient's comorbid condition of bipolar disorder I would discuss starting this medication with patient psychiatric team first.  Patient states she would like to try HEP and if no improvement or worsening of symptoms at follow-up visit, would like to further discuss starting duloxetine.  Pertinent previous records reviewed include previous psychiatry note, previous primary care note, previous lumbar x-ray, previous cervical MRI, previous chest x-rays   Follow Up: In 4 to 6 weeks for reevaluation.  Would consider formal PT versus duloxetine initiation at that time if no improvement or worsening of symptoms   Subjective:   I, Nadine Counts, am serving as a Neurosurgeon for Dr. Richardean Sale.  Chief Complaint: Neck and shoulder pain  HPI:   10/03/20 Mid back pain prominently on the right side. Characterized as burning sensation. Has a history of fibromyalgia and arthritis. Nothing makes the pain better, but movement makes it worse. Does has a consistent pain that goes through  the buttocks.  Denies numbness/tingling/weakness in extremities, saddle paresthesia, urinary incontinence, trauma, falls.  Patient is frustrated because everything she does is painful at this time.  She was previously diagnosed with fibromyalgia, but she was uncertain of exactly what this condition was or if she had used any specific medications to treat it.  Relevant Historical Information: Fibromyalgia, chronic neck/mid back/low back pain  Additional pertinent review of systems negative.   Current Outpatient Medications:    acetaminophen (TYLENOL) 500 MG tablet, Take 2 tablets (1,000 mg total) by mouth every 6 (six) hours as needed for mild pain or headache., Disp: 30 tablet, Rfl: 0   ARIPiprazole (ABILIFY) 10 MG tablet, Take 10 mg by mouth in the morning and at bedtime., Disp: , Rfl:    azelastine (ASTELIN) 0.1 % nasal spray, Place 2 sprays into both nostrils 2 (two) times daily., Disp: 30 mL, Rfl: 12   budesonide-formoterol (SYMBICORT) 80-4.5 MCG/ACT inhaler, Inhale 2 puffs into the lungs 2 (two) times daily., Disp: 1 each, Rfl: 3   dicyclomine (BENTYL) 20 MG tablet, Take 20 mg by mouth in the morning and at bedtime., Disp: , Rfl:    hydrOXYzine (ATARAX/VISTARIL) 25 MG tablet, Take 25 mg by mouth 3 (three) times daily as needed., Disp: , Rfl:    modafinil (PROVIGIL) 200 MG tablet, Take 1 tablet (200 mg total) by mouth in the morning and at bedtime., Disp: 14 tablet, Rfl: 0   omeprazole (PRILOSEC) 20 MG capsule, Take 20 mg by mouth daily., Disp: , Rfl:    omeprazole (PRILOSEC) 20 MG capsule, Take 1 capsule by mouth daily., Disp: , Rfl:    rOPINIRole (REQUIP) 4 MG tablet, Take by mouth  in the morning and at bedtime., Disp: , Rfl:    TEMAZEPAM PO, Take by mouth., Disp: , Rfl:    traZODone (DESYREL) 150 MG tablet, Take by mouth at bedtime., Disp: , Rfl:    valbenazine (INGREZZA) 80 MG capsule, Take by mouth., Disp: , Rfl:    Objective:     Vitals:   10/03/20 1051  BP: 138/76  Pulse: (!)  103  SpO2: 96%  Weight: 185 lb (83.9 kg)  Height: 5\' 5"  (1.651 m)      Body mass index is 30.79 kg/m.    Physical Exam:    Gen: Appears well, nad, nontoxic and pleasant Psych: Alert and oriented, appropriate mood and affect Neuro: sensation intact, strength is 5/5 in upper and lower extremities, muscle tone wnl Skin: no susupicious lesions or rashes  Back - Normal skin, Spine with thoracic left-sided curved alignment  No tenderness to vertebral process palpation.   Paraspinous muscles are tender and without spasm Straight leg raise negative Full range of motion of neck without pain Negative Spurling's  Electronically signed by:  D[facility] 11:37 AM 10/03/20

## 2020-10-03 NOTE — Patient Instructions (Signed)
Do prescribed exercises at least 3x a week Tylenol and NSAIDS as needed See you again in 4-6 weeks

## 2020-10-26 DIAGNOSIS — F3181 Bipolar II disorder: Secondary | ICD-10-CM | POA: Diagnosis not present

## 2020-10-26 DIAGNOSIS — F419 Anxiety disorder, unspecified: Secondary | ICD-10-CM | POA: Diagnosis not present

## 2020-10-26 DIAGNOSIS — G2581 Restless legs syndrome: Secondary | ICD-10-CM | POA: Diagnosis not present

## 2020-11-08 ENCOUNTER — Other Ambulatory Visit: Payer: Self-pay

## 2020-11-08 ENCOUNTER — Ambulatory Visit: Payer: Medicare PPO | Admitting: Sports Medicine

## 2020-11-08 VITALS — BP 140/90 | HR 96 | Ht 65.0 in | Wt 185.0 lb

## 2020-11-08 DIAGNOSIS — M2550 Pain in unspecified joint: Secondary | ICD-10-CM

## 2020-11-08 DIAGNOSIS — M797 Fibromyalgia: Secondary | ICD-10-CM | POA: Diagnosis not present

## 2020-11-08 DIAGNOSIS — M545 Low back pain, unspecified: Secondary | ICD-10-CM

## 2020-11-08 NOTE — Progress Notes (Signed)
Danielle Cox D.[facility] [address] [place] [address] Phone: [phone]   Assessment and Plan:     1. Fibromyalgia 2. Lumbar back pain 3. Arthralgia of multiple joints -Chronic with exacerbation, subsequent visit - No significant improvement in multiple musculoskeletal complaints with most significant being right lower back - No significant MOI or red flag signs that would require imaging at today's visit - Start HEP for core strengthening - I would like to start duloxetine 30 mg daily as I believe this would help with patient's multiple musculoskeletal complaints and fibromyalgia, however patient has past medical history of bipolar disorder.  Patient to reach out to her psychiatrist and ask if it is okay if she starts Cymbalta 30 mg.  She can contact our office and if it is approved, then we may prescribe Cymbalta 30 mg daily, 60 tablets, no refills.   Pertinent previous records reviewed include my office notes   Follow Up: 6-week follow-up   Subjective:   I, Debbe Odea, am serving as a scribe for Dr. Richardean Sale  Chief Complaint: Neck and shoulder pain   HPI:   10/03/20 Mid back pain prominently on the right side. Characterized as burning sensation. Has a history of fibromyalgia and arthritis. Nothing makes the pain better, but movement makes it worse. Does has a consistent pain that goes through the buttocks.  Denies numbness/tingling/weakness in extremities, saddle paresthesia, urinary incontinence, trauma, falls.  Patient is frustrated because everything she does is painful at this time.  She was previously diagnosed with fibromyalgia, but she was uncertain of exactly what this condition was or if she had used any specific medications to treat it.  11/08/20 Patient states that she still feels the same/no improvement. Patient states that she has been noticing numbness and tingling all over. Patient has been doing her HEP  but has a hard time getting on the ground to do them, did talk to her psychiatrist but completely forgot the name of the medication to ask her.   Relevant Historical Information: Fibromyalgia, bipolar disorder  Additional pertinent review of systems negative.   Current Outpatient Medications:    acetaminophen (TYLENOL) 500 MG tablet, Take 2 tablets (1,000 mg total) by mouth every 6 (six) hours as needed for mild pain or headache., Disp: 30 tablet, Rfl: 0   ARIPiprazole (ABILIFY) 10 MG tablet, Take 10 mg by mouth in the morning and at bedtime., Disp: , Rfl:    azelastine (ASTELIN) 0.1 % nasal spray, Place 2 sprays into both nostrils 2 (two) times daily., Disp: 30 mL, Rfl: 12   budesonide-formoterol (SYMBICORT) 80-4.5 MCG/ACT inhaler, Inhale 2 puffs into the lungs 2 (two) times daily., Disp: 1 each, Rfl: 3   dicyclomine (BENTYL) 20 MG tablet, Take 20 mg by mouth in the morning and at bedtime., Disp: , Rfl:    hydrOXYzine (ATARAX/VISTARIL) 25 MG tablet, Take 25 mg by mouth 3 (three) times daily as needed., Disp: , Rfl:    modafinil (PROVIGIL) 200 MG tablet, Take 1 tablet (200 mg total) by mouth in the morning and at bedtime., Disp: 14 tablet, Rfl: 0   omeprazole (PRILOSEC) 20 MG capsule, Take 20 mg by mouth daily., Disp: , Rfl:    omeprazole (PRILOSEC) 20 MG capsule, Take 1 capsule by mouth daily., Disp: , Rfl:    rOPINIRole (REQUIP) 4 MG tablet, Take by mouth in the morning and at bedtime., Disp: , Rfl:    TEMAZEPAM PO, Take by mouth., Disp: ,  Rfl:    traZODone (DESYREL) 150 MG tablet, Take by mouth at bedtime., Disp: , Rfl:    valbenazine (INGREZZA) 80 MG capsule, Take by mouth., Disp: , Rfl:    Objective:     Vitals:   11/08/20 1059  BP: 140/90  Pulse: 96  SpO2: 97%  Weight: 185 lb (83.9 kg)  Height: 5\' 5"  (1.651 m)      Body mass index is 30.79 kg/m.    Physical Exam:    Gen: Appears well, nad, nontoxic and pleasant Psych: Alert and oriented, appropriate mood and  affect Neuro: sensation intact, strength is 5/5 in upper and lower extremities, muscle tone wnl Skin: no susupicious lesions or rashes  Back - Normal skin, Spine with normal alignment and no deformity.   No tenderness to vertebral process palpation.   Paraspinous muscles are tender in cervical, thoracic, lumbar regions and without spasm Straight leg raise negative Trendelenberg negative    Electronically signed by:  D.[facility] 11:15 AM 11/08/20

## 2020-11-08 NOTE — Patient Instructions (Addendum)
Good to see you  Start core home exercises I would like to start Cybalta/duloxitine at 30mg  a day, but check with psychiatrist to see if that medication is okay to start then call and let us know Follow up in 6 weeks

## 2020-11-14 ENCOUNTER — Telehealth: Payer: Self-pay | Admitting: Sports Medicine

## 2020-11-14 NOTE — Telephone Encounter (Signed)
Patient called back stating that she spoke to her psychiatrist and she suggested that the patient not take Cymbalta due to being on Paxil. Is there something else that Dr Jean Rosenthal would recommend?

## 2020-12-02 ENCOUNTER — Other Ambulatory Visit: Payer: Self-pay

## 2020-12-02 MED ORDER — ATORVASTATIN CALCIUM 20 MG PO TABS: 20.0000 mg | ORAL_TABLET | Freq: Every day | ORAL | 1 refills | Status: DC

## 2021-01-18 NOTE — Progress Notes (Deleted)
Aleen Sells D.[facility] [address] [place] [address] Phone: [phone]   Assessment and Plan:     There are no diagnoses linked to this encounter.  ***   Pertinent previous records reviewed include ***   Follow Up: ***     Subjective:   I, Reymundo Winship, am serving as a Neurosurgeon for Doctor Richardean Sale  Chief Complaint: neck and shoulder pain   HPI:   10/03/20 Mid back pain prominently on the right side. Characterized as burning sensation. Has a history of fibromyalgia and arthritis. Nothing makes the pain better, but movement makes it worse. Does has a consistent pain that goes through the buttocks.  Denies numbness/tingling/weakness in extremities, saddle paresthesia, urinary incontinence, trauma, falls.  Patient is frustrated because everything she does is painful at this time.  She was previously diagnosed with fibromyalgia, but she was uncertain of exactly what this condition was or if she had used any specific medications to treat it.   11/08/20 Patient states that she still feels the same/no improvement. Patient states that she has been noticing numbness and tingling all over. Patient has been doing her HEP but has a hard time getting on the ground to do them, did talk to her psychiatrist but completely forgot the name of the medication to ask her.    01/19/2021 Patient states    Relevant Historical Information: Fibromyalgia, bipolar disorder  Additional pertinent review of systems negative.   Current Outpatient Medications:    acetaminophen (TYLENOL) 500 MG tablet, Take 2 tablets (1,000 mg total) by mouth every 6 (six) hours as needed for mild pain or headache., Disp: 30 tablet, Rfl: 0   ARIPiprazole (ABILIFY) 10 MG tablet, Take 10 mg by mouth in the morning and at bedtime., Disp: , Rfl:    atorvastatin (LIPITOR) 20 MG tablet, Take 1 tablet (20 mg total) by mouth daily., Disp: 90 tablet, Rfl: 1   azelastine  (ASTELIN) 0.1 % nasal spray, Place 2 sprays into both nostrils 2 (two) times daily., Disp: 30 mL, Rfl: 12   budesonide-formoterol (SYMBICORT) 80-4.5 MCG/ACT inhaler, Inhale 2 puffs into the lungs 2 (two) times daily., Disp: 1 each, Rfl: 3   dicyclomine (BENTYL) 20 MG tablet, Take 20 mg by mouth in the morning and at bedtime., Disp: , Rfl:    FLUoxetine (PROZAC) 20 MG tablet, Take 30 mg by mouth daily. (Patient not taking: Reported on 11/14/2020), Disp: , Rfl:    hydrOXYzine (ATARAX/VISTARIL) 25 MG tablet, Take 25 mg by mouth 3 (three) times daily as needed., Disp: , Rfl:    modafinil (PROVIGIL) 200 MG tablet, Take 1 tablet (200 mg total) by mouth in the morning and at bedtime., Disp: 14 tablet, Rfl: 0   omeprazole (PRILOSEC) 20 MG capsule, Take 20 mg by mouth daily., Disp: , Rfl:    omeprazole (PRILOSEC) 20 MG capsule, Take 1 capsule by mouth daily., Disp: , Rfl:    PARoxetine (PAXIL) 40 MG tablet, Take 40 mg by mouth every morning., Disp: , Rfl:    rOPINIRole (REQUIP) 4 MG tablet, Take by mouth in the morning and at bedtime., Disp: , Rfl:    TEMAZEPAM PO, Take by mouth., Disp: , Rfl:    traZODone (DESYREL) 150 MG tablet, Take by mouth at bedtime., Disp: , Rfl:    valbenazine (INGREZZA) 80 MG capsule, Take by mouth., Disp: , Rfl:    Objective:     There were no vitals filed for this visit.  There is no height or weight on file to calculate BMI.    Physical Exam:    ***   Electronically signed by:  Aleen Sells [facility] 3:46 PM 01/18/21

## 2021-01-19 ENCOUNTER — Ambulatory Visit: Payer: Medicare PPO | Admitting: Sports Medicine

## 2021-02-27 ENCOUNTER — Telehealth: Payer: Self-pay | Admitting: Family Medicine

## 2021-02-27 NOTE — Chronic Care Management (AMB) (Signed)
°  Chronic Care Management   Outreach Note  02/27/2021 Name: Danielle Cox MRN: [id number] DOB: 1960-01-17  Referred by: Vivi Barrack, MD Reason for referral : No chief complaint on file.   An unsuccessful telephone outreach was attempted today. The patient was referred to the pharmacist for assistance with care management and care coordination.   Follow Up Plan:   Tatjana Dellinger Upstream Scheduler

## 2021-03-03 ENCOUNTER — Telehealth: Payer: Self-pay | Admitting: Family Medicine

## 2021-03-03 NOTE — Chronic Care Management (AMB) (Signed)
°  Chronic Care Management   Note  03/03/2021 Name: Danielle Cox MRN: [id number] DOB: 11-14-1960  Danielle Cox is a 61 y.o. year old female who is a primary care patient of [name], MD. I reached out to [name] by phone today in response to a referral sent by Danielle Cox PCP, [name], MD.   Danielle Cox was given information about Chronic Care Management services today including:  CCM service includes personalized support from designated clinical staff supervised by her physician, including individualized plan of care and coordination with other care providers 24/7 contact phone numbers for assistance for urgent and routine care needs. Service will only be billed when office clinical staff spend 20 minutes or more in a month to coordinate care. Only one practitioner may furnish and bill the service in a calendar month. The patient may stop CCM services at any time (effective at the end of the month) by phone call to the office staff.   Patient agreed to services and verbal consent obtained.   Follow up plan: PT AWARE $20 COPAY   Tatjana Dellinger Upstream Scheduler

## 2021-03-09 DIAGNOSIS — F3111 Bipolar disorder, current episode manic without psychotic features, mild: Secondary | ICD-10-CM | POA: Diagnosis not present

## 2021-03-09 DIAGNOSIS — G2581 Restless legs syndrome: Secondary | ICD-10-CM | POA: Diagnosis not present

## 2021-03-09 DIAGNOSIS — G2401 Drug induced subacute dyskinesia: Secondary | ICD-10-CM | POA: Diagnosis not present

## 2021-03-28 ENCOUNTER — Telehealth: Payer: Medicare PPO

## 2021-04-05 ENCOUNTER — Other Ambulatory Visit: Payer: Self-pay | Admitting: Family Medicine

## 2021-04-05 DIAGNOSIS — Z1231 Encounter for screening mammogram for malignant neoplasm of breast: Secondary | ICD-10-CM

## 2021-04-17 ENCOUNTER — Telehealth: Payer: Self-pay | Admitting: Pharmacist

## 2021-04-17 NOTE — Progress Notes (Signed)
? ? ?Chronic Care Management ?Pharmacy Assistant  ? ?Name: Danielle Cox  MRN: [id number] DOB: 1960-07-23 ? ?Reason for Encounter: Chart Review For Initial Visit With Clinical Pharmacist ?  ?Conditions to be addressed/monitored: ?HTN, COPD, Insulin resistance, Fibromyalgia ? ?Primary concerns for visit include: ?HTN  ? ?Recent office visits:  ?None ? ?Recent consult visits:  ?11/08/2020 [facility] (Sports Medicine) Richardean Sale, DO; - I would like to start duloxetine 30 mg daily as I believe this would help with patient's multiple musculoskeletal complaints and fibromyalgia, however patient has past medical history of bipolar disorder.  Patient to reach out to her psychiatrist and ask if it is okay if she starts Cymbalta 30 mg.  She can contact our office and if it is approved, then we may prescribe Cymbalta 30 mg daily, 60 tablets, no refills. ? ?Hospital visits:  ?None in previous 6 months ? ?Medications: ?Outpatient Encounter Medications as of 04/17/2021  ?Medication Sig  ? acetaminophen (TYLENOL) 500 MG tablet Take 2 tablets (1,000 mg total) by mouth every 6 (six) hours as needed for mild pain or headache.  ? ARIPiprazole (ABILIFY) 10 MG tablet Take 10 mg by mouth in the morning and at bedtime.  ? atorvastatin (LIPITOR) 20 MG tablet Take 1 tablet (20 mg total) by mouth daily.  ? azelastine (ASTELIN) 0.1 % nasal spray Place 2 sprays into both nostrils 2 (two) times daily.  ? budesonide-formoterol (SYMBICORT) 80-4.5 MCG/ACT inhaler Inhale 2 puffs into the lungs 2 (two) times daily.  ? dicyclomine (BENTYL) 20 MG tablet Take 20 mg by mouth in the morning and at bedtime.  ? FLUoxetine (PROZAC) 20 MG tablet Take 30 mg by mouth daily. (Patient not taking: Reported on 11/14/2020)  ? hydrOXYzine (ATARAX/VISTARIL) 25 MG tablet Take 25 mg by mouth 3 (three) times daily as needed.  ? modafinil (PROVIGIL) 200 MG tablet Take 1 tablet (200 mg total) by mouth in the morning and at bedtime.  ? omeprazole (PRILOSEC) 20 MG capsule  Take 20 mg by mouth daily.  ? omeprazole (PRILOSEC) 20 MG capsule Take 1 capsule by mouth daily.  ? PARoxetine (PAXIL) 40 MG tablet Take 40 mg by mouth every morning.  ? rOPINIRole (REQUIP) 4 MG tablet Take by mouth in the morning and at bedtime.  ? TEMAZEPAM PO Take by mouth.  ? traZODone (DESYREL) 150 MG tablet Take by mouth at bedtime.  ? valbenazine (INGREZZA) 80 MG capsule Take by mouth.  ? ?No facility-administered encounter medications on file as of 04/17/2021.  ? ?Current Medications: ?Atorvastatin 20 mg last filled 12/03/2020 90 DS ?Fluoxetine 20 mg last filled 06/27/2020 30 DS ?Paroxetine 40 mg last filled 11/22/2020 30 DS ?Modafinil 200 mg last filled 02/27/2021 30 DS ?Omeprazole 20 mg last filled 02/27/2021 30 DS ?Dicyclomine 20 mg last filled 02/11/2021 30 DS ?Valbenazine 80 mg last filled 02/15/2021 30 DS ?Ropinirole 4 mg last filled 02/16/2021 30 DS ?Temazepam PO last filled 12/06/2019 30 DS ?Azelastine 0.1% nasal spray last filled 09/30/2020 30 DS ?Symbicort 80-4.5 mcg/act last filled 05/10/2020 30 DS ?Hydroxyzine 25 mg last filled 02/11/2021 30 DS ?Trazodone 150 mg last filled 02/15/2021 30 DS ?Acetaminophen 500 mg ?Aripiprazole 10 mg last filled 02/20/2021 30 DS ? ?Patient Questions: ?Any changes in your medications or health? ?Patient states she has been feeling "draggy and tired" lately. ? ?Any side effects from any medications?  ?Patient states Abilify makes her nervous so it was decreased from 30 to 10 mg. ? ?Do you have any symptoms or problems  not managed by your medications? ?Patient denies having any symptoms or problems that is not currently managed by her medications. ? ?Any concerns about your health right now? ?She states she has concerned with herself feeling really tired. She also stats she is nervous about an upcoming mammogram. She states breast cancer runs in her family. ? ?Has your provider asked that you check blood pressure, blood sugar, or follow special diet at home? ?She does  not check blood pressure or blood sugar. She does not follow any special diet. ? ?Do you get any type of exercise on a regular basis? ?"Very little." ? ?Can you think of a goal you would like to reach for your health? ?Patient states she can not think of a goal she would like to reach for her health. ? ?Do you have any problems getting your medications? ?Patient denies having any problems getting her medications. ? ?Is there anything that you would like to discuss during the appointment?  ?Patient states she would like to discuss her medications. She would also like to discuss if there are any good vitamins that she should be taking on a regular basis. ? ?Please bring medications and supplements to appointment ? ?Care Gaps: ?Medicare Annual Wellness: Completed 08/19/2020 ?Hemoglobin A1C: 6.0% on 04/05/2020 ?Colonoscopy: Completed 05/02/2020 ?Mammogram: Scheduled for 04/20/2021 ? ?Future Appointments  ?Date Time Provider Department Center  ?04/18/2021 10:30 AM [facility]  ?04/18/2021 11:00 AM [facility] CCM PHARMACIST [facility]  ?04/20/2021 11:50 AM [facility] 2 [facility]  ?08/25/2021  2:30 PM [facility] HEALTH COACH [facility]  ? ?Star Rating Drugs: ?Atorvastatin 20 mg last filled 12/03/2020 90 DS ? ?April D Calhoun, CMA ?Clinical Pharmacist Assistant ?[phone]  ?

## 2021-04-18 ENCOUNTER — Other Ambulatory Visit: Payer: Medicare PPO

## 2021-04-18 ENCOUNTER — Ambulatory Visit (INDEPENDENT_AMBULATORY_CARE_PROVIDER_SITE_OTHER): Payer: Medicare PPO | Admitting: Family Medicine

## 2021-04-18 ENCOUNTER — Ambulatory Visit (INDEPENDENT_AMBULATORY_CARE_PROVIDER_SITE_OTHER): Payer: Medicare PPO | Admitting: Pharmacist

## 2021-04-18 VITALS — BP 123/81 | HR 105 | Temp 98.7°F | Ht 65.0 in | Wt 179.4 lb

## 2021-04-18 DIAGNOSIS — J449 Chronic obstructive pulmonary disease, unspecified: Secondary | ICD-10-CM

## 2021-04-18 DIAGNOSIS — F609 Personality disorder, unspecified: Secondary | ICD-10-CM

## 2021-04-18 DIAGNOSIS — E8881 Metabolic syndrome: Secondary | ICD-10-CM

## 2021-04-18 DIAGNOSIS — I10 Essential (primary) hypertension: Secondary | ICD-10-CM

## 2021-04-18 DIAGNOSIS — Z79899 Other long term (current) drug therapy: Secondary | ICD-10-CM | POA: Diagnosis not present

## 2021-04-18 DIAGNOSIS — Z0001 Encounter for general adult medical examination with abnormal findings: Secondary | ICD-10-CM | POA: Diagnosis not present

## 2021-04-18 DIAGNOSIS — M797 Fibromyalgia: Secondary | ICD-10-CM

## 2021-04-18 DIAGNOSIS — F319 Bipolar disorder, unspecified: Secondary | ICD-10-CM

## 2021-04-18 DIAGNOSIS — F17200 Nicotine dependence, unspecified, uncomplicated: Secondary | ICD-10-CM

## 2021-04-18 LAB — LIPID PANEL
Cholesterol: 207 mg/dL — ABNORMAL HIGH (ref 0–200)
HDL: 56.2 mg/dL (ref >39.00)
LDL Cholesterol: 128 mg/dL — ABNORMAL HIGH (ref 0–99)
NonHDL: 151.28
Total CHOL/HDL Ratio: 4
Triglycerides: 116 mg/dL (ref 0.0–149.0)
VLDL: 23.2 mg/dL (ref 0.0–40.0)

## 2021-04-18 LAB — CBC WITH DIFFERENTIAL/PLATELET
Basophils Absolute: 0 10*3/uL (ref 0.0–0.1)
Basophils Relative: 0.3 % (ref 0.0–3.0)
Eosinophils Absolute: 0.1 10*3/uL (ref 0.0–0.7)
Eosinophils Relative: 0.6 % (ref 0.0–5.0)
HCT: 44.8 % (ref 36.0–46.0)
Hemoglobin: 15.3 g/dL — ABNORMAL HIGH (ref 12.0–15.0)
Lymphocytes Relative: 18.3 % (ref 12.0–46.0)
Lymphs Abs: 1.8 10*3/uL (ref 0.7–4.0)
MCHC: 34 g/dL (ref 30.0–36.0)
MCV: 93.1 fl (ref 78.0–100.0)
Monocytes Absolute: 0.5 10*3/uL (ref 0.1–1.0)
Monocytes Relative: 4.8 % (ref 3.0–12.0)
Neutro Abs: 7.3 10*3/uL (ref 1.4–7.7)
Neutrophils Relative %: 76 % (ref 43.0–77.0)
Platelets: 269 10*3/uL (ref 150.0–400.0)
RBC: 4.81 Mil/uL (ref 3.87–5.11)
RDW: 12.9 % (ref 11.5–15.5)
WBC: 9.6 10*3/uL (ref 4.0–10.5)

## 2021-04-18 LAB — COMPREHENSIVE METABOLIC PANEL
ALT: 23 U/L (ref 0–35)
AST: 15 U/L (ref 0–37)
Albumin: 4.6 g/dL (ref 3.5–5.2)
Alkaline Phosphatase: 62 U/L (ref 39–117)
BUN: 17 mg/dL (ref 6–23)
CO2: 29 mEq/L (ref 19–32)
Calcium: 9.8 mg/dL (ref 8.4–10.5)
Chloride: 106 mEq/L (ref 96–112)
Creatinine, Ser: 0.79 mg/dL (ref 0.40–1.20)
GFR: 82.2 mL/min (ref >60.00)
Glucose, Bld: 111 mg/dL — ABNORMAL HIGH (ref 70–99)
Potassium: 4.4 mEq/L (ref 3.5–5.1)
Sodium: 143 mEq/L (ref 135–145)
Total Bilirubin: 0.3 mg/dL (ref 0.2–1.2)
Total Protein: 6.4 g/dL (ref 6.0–8.3)

## 2021-04-18 LAB — TSH: TSH: 1 u[IU]/mL (ref 0.35–5.50)

## 2021-04-18 LAB — VITAMIN B12: Vitamin B-12: 437 pg/mL (ref 211–911)

## 2021-04-18 LAB — VITAMIN D 25 HYDROXY (VIT D DEFICIENCY, FRACTURES): VITD: 32.99 ng/mL (ref 30.00–100.00)

## 2021-04-18 LAB — HEMOGLOBIN A1C: Hgb A1c MFr Bld: 5.7 % (ref 4.6–6.5)

## 2021-04-18 NOTE — Assessment & Plan Note (Signed)
Patient was asked about her tobacco use today and was strongly advised to quit. Patient is currently not contemplative. We reviewed treatment options to assist her quit smoking including NRT, Chantix, and Bupropion. Follow up at next office visit.   Total time spent counseling approximately 3 minutes.   

## 2021-04-18 NOTE — Progress Notes (Signed)
? ?Chief Complaint:  ?Danielle Cox is a 61 y.o. female who presents today for her annual comprehensive physical exam.   ? ?Assessment/Plan:  ?Chronic Problems Addressed Today: ?No problem-specific Assessment & Plan notes found for this encounter. ? ? ?Preventative Healthcare: ?Check labs.  Deferred Pap and cologuard. ? ?Patient Counseling(The following topics were reviewed and/or handout was given): ? -Nutrition: Stressed importance of moderation in sodium/caffeine intake, saturated fat and cholesterol, caloric balance, sufficient intake of fresh fruits, vegetables, and fiber. ? -Stressed the importance of regular exercise.  ? -Substance Abuse: Discussed cessation/primary prevention of tobacco, alcohol, or other drug use; driving or other dangerous activities under the influence; availability of treatment for abuse.  ? -Injury prevention: Discussed safety belts, safety helmets, smoke detector, smoking near bedding or upholstery.  ? -Sexuality: Discussed sexually transmitted diseases, partner selection, use of condoms, avoidance of unintended pregnancy and contraceptive alternatives.  ? -Dental health: Discussed importance of regular tooth brushing, flossing, and dental visits. ? -Health maintenance and immunizations reviewed. Please refer to Health maintenance section. ? ?Return to care in 1 year for next preventative visit.  ? ?  ?Subjective:  ?HPI: ? ?She has no acute complaints today.  ? ?Patient here with nose dryness. She has tried saline nasal spray with no improvement. She has been smoking a lot. She is trying to drinks fluids. She does have a history of seasonal allergies rhinitis. No reported fever or chills. Denies congestion.  ? ?She has seen her psychiatry about 3 weeks ago. She was started on Cymbalta. This seems to be helping with the back pain. She has been tolerating her medication. No side effects.   ? ?Lifestyle ?Diet: Balanced.  ?Exercise: Likes to walk. ? ? ?  09/22/2020  ? 10:26 AM   ?Depression screen PHQ 2/9  ?Decreased Interest 3  ?Down, Depressed, Hopeless 3  ?PHQ - 2 Score 6  ?Altered sleeping 0  ?Tired, decreased energy 2  ?Change in appetite 3  ?Feeling bad or failure about yourself  0  ?Trouble concentrating 2  ?Moving slowly or fidgety/restless 0  ?Suicidal thoughts 0  ?PHQ-9 Score 13  ?Difficult doing work/chores Very difficult  ? ? ?Health Maintenance Due  ?Topic Date Due  ? Zoster Vaccines- Shingrix (1 of 2) Never done  ? COVID-19 Vaccine (3 - Booster for Moderna series) 07/30/2019  ? Fecal DNA (Cologuard)  12/02/2020  ? PAP SMEAR-Modifier  03/02/2021  ?  ? ?ROS: Per HPI, otherwise a complete review of systems was negative.  ? ?PMH: ? ?The following were reviewed and entered/updated in epic: ?Past Medical History:  ?Diagnosis Date  ? Anxiety   ? Bipolar disorder ([place])   ? Bronchitis, chronic ([place])   ? COPD (chronic obstructive pulmonary disease) ([place])   ? Depression   ? Personality disorder ([place])   ? ?Patient Active Problem List  ? Diagnosis Date Noted  ? Gastrointestinal hemorrhage 04/05/2020  ? Bipolar affective disorder, currently manic, moderate ([place]) 05/17/2019  ? Seasonal allergic rhinitis due to pollen 08/24/2018  ? Arthralgia of multiple joints 08/24/2018  ? Insulin resistance 08/24/2018  ? Cervical radiculopathy 12/29/2017  ? Lumbar back pain 12/29/2017  ? Fibromyalgia 11/21/2017  ? COPD (chronic obstructive pulmonary disease) ([place]) 11/21/2017  ? Personality disorder ([place]) 04/23/2013  ? Current every day smoker 02/17/2009  ? Essential hypertension 02/17/2009  ? Cough 02/17/2009  ? ?Past Surgical History:  ?Procedure Laterality Date  ? CARPAL TUNNEL RELEASE Right   ? [place]  ?  TONSILLECTOMY  age 76  ? ? ?Family History  ?Problem Relation Age of Onset  ? Depression Mother   ? Alcohol abuse Mother   ? Depression Father   ? Alcohol abuse Father   ? Alcohol abuse Sister   ? Depression Sister   ? Alcohol abuse Brother   ? Depression Brother   ?      overdose  ? Breast cancer Neg Hx   ? ? ?Medications- reviewed and updated ?Current Outpatient Medications  ?Medication Sig Dispense Refill  ? acetaminophen (TYLENOL) 500 MG tablet Take 2 tablets (1,000 mg total) by mouth every 6 (six) hours as needed for mild pain or headache. 30 tablet 0  ? ARIPiprazole (ABILIFY) 10 MG tablet Take 10 mg by mouth in the morning and at bedtime.    ? atorvastatin (LIPITOR) 20 MG tablet Take 1 tablet (20 mg total) by mouth daily. 90 tablet 1  ? azelastine (ASTELIN) 0.1 % nasal spray Place 2 sprays into both nostrils 2 (two) times daily. 30 mL 12  ? budesonide-formoterol (SYMBICORT) 80-4.5 MCG/ACT inhaler Inhale 2 puffs into the lungs 2 (two) times daily. 1 each 3  ? dicyclomine (BENTYL) 20 MG tablet Take 20 mg by mouth in the morning and at bedtime.    ? FLUoxetine (PROZAC) 20 MG tablet Take 30 mg by mouth daily. (Patient not taking: Reported on 11/14/2020)    ? hydrOXYzine (ATARAX/VISTARIL) 25 MG tablet Take 25 mg by mouth 3 (three) times daily as needed.    ? modafinil (PROVIGIL) 200 MG tablet Take 1 tablet (200 mg total) by mouth in the morning and at bedtime. 14 tablet 0  ? omeprazole (PRILOSEC) 20 MG capsule Take 20 mg by mouth daily.    ? omeprazole (PRILOSEC) 20 MG capsule Take 1 capsule by mouth daily.    ? PARoxetine (PAXIL) 40 MG tablet Take 40 mg by mouth every morning.    ? rOPINIRole (REQUIP) 4 MG tablet Take by mouth in the morning and at bedtime.    ? TEMAZEPAM PO Take by mouth.    ? traZODone (DESYREL) 150 MG tablet Take by mouth at bedtime.    ? valbenazine (INGREZZA) 80 MG capsule Take by mouth.    ? ?No current facility-administered medications for this visit.  ? ? ?Allergies-reviewed and updated ?Allergies  ?Allergen Reactions  ? Moxifloxacin Hcl Palpitations  ?  REACTION: tackycardia  ? Penicillins Rash  ?  Has patient had a PCN reaction causing immediate rash, facial/tongue/throat swelling, SOB or lightheadedness with hypotension: Yes ?Has patient had a PCN  reaction causing severe rash involving mucus membranes or skin necrosis: No ?Has patient had a PCN reaction that required hospitalization: No ?Has patient had a PCN reaction occurring within the last 10 years: No ?If all of the above answers are "NO", then may proceed with Cephalosporin use. ?  ? Corticosteroids Other (See Comments)  ?  [place]ia  ? ? ?Social History  ? ?Socioeconomic History  ? Marital status: Married  ?  Spouse name: Onalee Hua  ? Number of children: 3  ? Years of education: Not on file  ? Highest education level: GED or equivalent  ?Occupational History  ? Occupation: disabled  ?Tobacco Use  ? Smoking status: Every Day  ?  Packs/day: 1.00  ?  Years: 40.00  ?  Pack years: 40.00  ?  Types: Cigarettes  ? Smokeless tobacco: Never  ?Vaping Use  ? Vaping Use: Never used  ?Substance and Sexual Activity  ?  Alcohol use: No  ? Drug use: No  ? Sexual activity: Not Currently  ?  Partners: Male  ?  Birth control/protection: None  ?Other Topics Concern  ? Not on file  ?Social History Narrative  ? 10/29/2012 [name] Jessiemae was born and grew up in [place], [place]. She has 2 brothers and one sister. She reports that her childhood was "poor," and explains that she needs both financially and emotionally. She completed the 10th grade, then achieved her GED. She has been married twice. The first time at age 39, and that ended after 1 year. She is currently married to her second husband of 27 years. She has 2 sons and one daughter. She is currently unemployed and on disability for 2 years. She lives with her husband, her daughter, and one of her daughter's friends. She denies any legal difficulties. She reports that she is spiritual but not religious. Her hobbies include reading and cross stitch. She reports that she has no social support network. 10/29/2012 [name]  ?   ? Patient is right-handed.she lives with her husband ina one story home with a few steps to enter. She drinks 2 cups of tea a day. She is active around the home.   ? ?Social Determinants of Health  ? ?Financial Resource Strain: Low Risk   ? Difficulty of Paying Living Expenses: Not hard at all  ?Food Insecurity: No Food Insecurity  ? Worried About Charity fundraiser in the Last Year: Ne

## 2021-04-18 NOTE — Patient Instructions (Signed)
It was very nice to see you today! ? ?We will check blood work today. ? ?Please continue to work on diet and exercise. ? ?I will see you back in year for your next physical.  Come back sooner if needed. ? ?Take care, ?Dr Jerline Pain ? ?PLEASE NOTE: ? ?If you had any lab tests please let us know if you have not heard back within a few days. You may see your results on mychart before we have a chance to review them but we will give you a call once they are reviewed by [place]. If we ordered any referrals today, please let us know if you have not heard from their office within the next week.  ? ?Please try these tips to maintain a healthy lifestyle: ? ?Eat at least 3 REAL meals and 1-2 snacks per day.  Aim for no more than 5 hours between eating.  If you eat breakfast, please do so within one hour of getting up.  ? ?Each meal should contain half fruits/vegetables, one quarter protein, and one quarter carbs (no bigger than a computer mouse) ? ?Cut down on sweet beverages. This includes juice, soda, and sweet tea.  ? ?Drink at least 1 glass of water with each meal and aim for at least 8 glasses per day ? ?Exercise at least 150 minutes every week.   ? ?Preventive Care 59-47 Years Old, Female ?Preventive care refers to lifestyle choices and visits with your health care provider that can promote health and wellness. Preventive care visits are also called wellness exams. ?What can I expect for my preventive care visit? ?Counseling ?Your health care provider may ask you questions about your: ?Medical history, including: ?Past medical problems. ?Family medical history. ?Pregnancy history. ?Current health, including: ?Menstrual cycle. ?Method of birth control. ?Emotional well-being. ?Home life and relationship well-being. ?Sexual activity and sexual health. ?Lifestyle, including: ?Alcohol, nicotine or tobacco, and drug use. ?Access to firearms. ?Diet, exercise, and sleep habits. ?Work and work Statistician. ?Sunscreen use. ?Safety issues  such as seatbelt and bike helmet use. ?Physical exam ?Your health care provider will check your: ?Height and weight. These may be used to calculate your BMI (body mass index). BMI is a measurement that tells if you are at a healthy weight. ?Waist circumference. This measures the distance around your waistline. This measurement also tells if you are at a healthy weight and may help predict your risk of certain diseases, such as type 2 diabetes and high blood pressure. ?Heart rate and blood pressure. ?Body temperature. ?Skin for abnormal spots. ?What immunizations do I need? ?Vaccines are usually given at various ages, according to a schedule. Your health care provider will recommend vaccines for you based on your age, medical history, and lifestyle or other factors, such as travel or where you work. ?What tests do I need? ?Screening ?Your health care provider may recommend screening tests for certain conditions. This may include: ?Lipid and cholesterol levels. ?Diabetes screening. This is done by checking your blood sugar (glucose) after you have not eaten for a while (fasting). ?Pelvic exam and Pap test. ?Hepatitis B test. ?Hepatitis C test. ?HIV (human immunodeficiency virus) test. ?STI (sexually transmitted infection) testing, if you are at risk. ?Lung cancer screening. ?Colorectal cancer screening. ?Mammogram. Talk with your health care provider about when you should start having regular mammograms. This may depend on whether you have a family history of breast cancer. ?BRCA-related cancer screening. This may be done if you have a family history of breast, ovarian,  tubal, or peritoneal cancers. ?Bone density scan. This is done to screen for osteoporosis. ?Talk with your health care provider about your test results, treatment options, and if necessary, the need for more tests. ?Follow these instructions at home: ?Eating and drinking ? ?Eat a diet that includes fresh fruits and vegetables, whole grains, lean  protein, and low-fat dairy products. ?Take vitamin and mineral supplements as recommended by your health care provider. ?Do not drink alcohol if: ?Your health care provider tells you not to drink. ?You are pregnant, may be pregnant, or are planning to become pregnant. ?If you drink alcohol: ?Limit how much you have to 0-1 drink a day. ?Know how much alcohol is in your drink. In the [place] one drink equals one 12 oz bottle of beer (355 mL), one 5 oz glass of wine (148 mL), or one 1? oz glass of hard liquor (44 mL). ?Lifestyle ?Brush your teeth every morning and night with fluoride toothpaste. Floss one time each day. ?Exercise for at least 30 minutes 5 or more days each week. ?Do not use any products that contain nicotine or tobacco. These products include cigarettes, chewing tobacco, and vaping devices, such as e-cigarettes. If you need help quitting, ask your health care provider. ?Do not use drugs. ?If you are sexually active, practice safe sex. Use a condom or other form of protection to prevent STIs. ?If you do not wish to become pregnant, use a form of birth control. If you plan to become pregnant, see your health care provider for a prepregnancy visit. ?Take aspirin only as told by your health care provider. Make sure that you understand how much to take and what form to take. Work with your health care provider to find out whether it is safe and beneficial for you to take aspirin daily. ?Find healthy ways to manage stress, such as: ?Meditation, yoga, or listening to music. ?Journaling. ?Talking to a trusted person. ?Spending time with friends and family. ?Minimize exposure to UV radiation to reduce your risk of skin cancer. ?Safety ?Always wear your seat belt while driving or riding in a vehicle. ?Do not drive: ?If you have been drinking alcohol. Do not ride with someone who has been drinking. ?When you are tired or distracted. ?While texting. ?If you have been using any mind-altering substances or  drugs. ?Wear a helmet and other protective equipment during sports activities. ?If you have firearms in your house, make sure you follow all gun safety procedures. ?Seek help if you have been physically or sexually abused. ?What's next? ?Visit your health care provider once a year for an annual wellness visit. ?Ask your health care provider how often you should have your eyes and teeth checked. ?Stay up to date on all vaccines. ?This information is not intended to replace advice given to you by your health care provider. Make sure you discuss any questions you have with your health care provider. ?Document Revised: 06/29/2020 Document Reviewed: 06/29/2020 ?Elsevier Patient Education ? [place]. ? ?

## 2021-04-18 NOTE — Progress Notes (Signed)
? ?Chronic Care Management ?Pharmacy Note ? ?04/18/2021 ?Name:  Danielle Cox MRN:  [id number] DOB:  [birth year] ? ?Summary: ?Initial visit with PharmD.  Patient doing well on current meds.  Believes pain and depression better controlled since addition of Cymbalta.  She does mention some SOB at home and not currently using Symbicort. ? ?Recommendations/Changes made from today's visit: ?New rx for Symbicort to use daily as maintenance ? ?Plan: ?FU 6 months ? ? ?Subjective: ?Danielle Cox is an 61 y.o. year old female who is a primary patient of Jerline Pain, Algis Greenhouse, MD.  The CCM team was consulted for assistance with disease management and care coordination needs.   ? ?Engaged with patient face to face for initial visit in response to provider referral for pharmacy case management and/or care coordination services.  ? ?Consent to Services:  ?The patient was given the following information about Chronic Care Management services today, agreed to services, and gave verbal consent: 1. CCM service includes personalized support from designated clinical staff supervised by the primary care provider, including individualized plan of care and coordination with other care providers 2. 24/7 contact phone numbers for assistance for urgent and routine care needs. 3. Service will only be billed when office clinical staff spend 20 minutes or more in a month to coordinate care. 4. Only one practitioner may furnish and bill the service in a calendar month. 5.The patient may stop CCM services at any time (effective at the end of the month) by phone call to the office staff. 6. The patient will be responsible for cost sharing (co-pay) of up to 20% of the service fee (after annual deductible is met). Patient agreed to services and consent obtained. ? ?Patient Care Team: ?Vivi Barrack, MD as PCP - General (Family Medicine) ?Gerda Diss, DO as Consulting Physician (Family Medicine) ?Bridgett Larsson, DDS (Dental General Practice) ?Noemi Chapel, NP as Nurse Practitioner ?Pieter Partridge, DO as Consulting Physician (Neurology) ?[facility] (Pain Medicine) ?Norma Fredrickson, MD as Consulting Physician (Psychiatry) ?Dian Situ, MD as Consulting Physician (Pain Medicine) ?Edythe Clarity, [facility] as Pharmacist (Pharmacist) ? ?Recent office visits:  ?None ?  ?Recent consult visits:  ?11/08/2020 OV (Sports Medicine) Glennon Mac, DO; - I would like to start duloxetine 30 mg daily as I believe this would help with patient's multiple musculoskeletal complaints and fibromyalgia, however patient has past medical history of bipolar disorder.  Patient to reach out to her psychiatrist and ask if it is okay if she starts Cymbalta 30 mg.  She can contact our office and if it is approved, then we may prescribe Cymbalta 30 mg daily, 60 tablets, no refills. ?  ?Hospital visits:  ?None in previous 6 months ? ?Objective: ? ?Lab Results  ?Component Value Date  ? CREATININE 0.69 04/05/2020  ? BUN 13 04/05/2020  ? GFR 96.07 04/05/2020  ? GFRNONAA >60 05/15/2019  ? GFRAA >60 05/15/2019  ? NA 143 04/05/2020  ? K 3.8 04/05/2020  ? CALCIUM 9.5 04/05/2020  ? CO2 28 04/05/2020  ? GLUCOSE 124 (H) 04/05/2020  ? ? ?Lab Results  ?Component Value Date/Time  ? HGBA1C 6.0 04/05/2020 11:51 AM  ? HGBA1C 5.7 08/20/2018 09:31 AM  ? GFR 96.07 04/05/2020 11:51 AM  ? GFR 73.09 08/20/2018 09:31 AM  ?  ?Last diabetic Eye exam: No results found for: HMDIABEYEEXA  ?Last diabetic Foot exam: No results found for: HMDIABFOOTEX  ? ?Lab Results  ?Component Value Date  ?  CHOL 181 08/20/2018  ? HDL 46.40 08/20/2018  ? LDLCALC 104 (H) 08/20/2018  ? LDLDIRECT 173.0 02/11/2018  ? TRIG 153.0 (H) 08/20/2018  ? CHOLHDL 4 08/20/2018  ? ? ? ?  Latest Ref Rng & Units 04/05/2020  ? 11:51 AM 05/15/2019  ? 12:46 PM 08/20/2018  ?  9:31 AM  ?Hepatic Function  ?Total Protein 6.0 - 8.3 g/dL 6.5   6.7   6.3    ?Albumin 3.5 - 5.2 g/dL 4.4   4.2   4.3    ?AST 0 - 37 U/L _0 ?ALT 0 -  35 U/L 24   34   49    ?Alk Phosphatase 39 - 117 U/L 72   60   68    ?Total Bilirubin 0.2 - 1.2 mg/dL 0.3   0.5   0.2    ? ? ?Lab Results  ?Component Value Date/Time  ? TSH 0.75 04/05/2020 11:51 AM  ? TSH 0.89 08/20/2018 09:31 AM  ? FREET4 0.82 08/20/2018 09:31 AM  ? ? ? ?  Latest Ref Rng & Units 04/05/2020  ? 11:51 AM 05/15/2019  ? 12:46 PM 08/20/2018  ?  9:31 AM  ?CBC  ?WBC 4.0 - 10.5 K/uL 7.9   11.3   10.8    ?Hemoglobin 12.0 - 15.0 g/dL 15.5   15.0   15.6    ?Hematocrit 36.0 - 46.0 % 45.4   45.4   46.6    ?Platelets 150.0 - 400.0 K/uL 248.0   278   248.0    ? ? ?Lab Results  ?Component Value Date/Time  ? VD25OH 27.69 (L) 05/02/2016 09:42 AM  ? ? ?Clinical ASCVD: No  ?The 10-year ASCVD risk score (Arnett DK, et al., 2019) is: 5.8% ?  Values used to calculate the score: ?    Age: 61 years ?    Sex: Female ?    Is Non-Hispanic African American: No ?    Diabetic: No ?    Tobacco smoker: Yes ?    Systolic Blood Pressure: 932 mmHg ?    Is BP treated: No ?    HDL Cholesterol: 46.4 mg/dL ?    Total Cholesterol: 181 mg/dL   ? ? ?  09/22/2020  ? 10:26 AM 08/19/2020  ?  2:40 PM 04/14/2019  ?  8:33 AM  ?Depression screen PHQ 2/9  ?Decreased Interest 3 0   ?Down, Depressed, Hopeless 3 1   ?PHQ - 2 Score 6 1   ?Altered sleeping 0    ?Tired, decreased energy 2    ?Change in appetite 3    ?Feeling bad or failure about yourself  0    ?Trouble concentrating 2    ?Moving slowly or fidgety/restless 0    ?Suicidal thoughts 0    ?PHQ-9 Score 13    ?Difficult doing work/chores Very difficult  Not difficult at all  ?  ? ? ?Social History  ? ?Tobacco Use  ?Smoking Status Every Day  ? Packs/day: 1.00  ? Years: 40.00  ? Pack years: 40.00  ? Types: Cigarettes  ?Smokeless Tobacco Never  ? ?BP Readings from Last 3 Encounters:  ?04/18/21 123/81  ?11/08/20 140/90  ?10/03/20 138/76  ? ?Pulse Readings from Last 3 Encounters:  ?04/18/21 (!) 105  ?11/08/20 96  ?10/03/20 (!) 103  ? ?Wt Readings from Last 3 Encounters:  ?04/18/21 179 lb 6.4 oz (81.4 kg)   ?11/08/20 185 lb (83.9 kg)  ?10/03/20  185 lb (83.9 kg)  ? ?BMI Readings from Last 3 Encounters:  ?04/18/21 29.85 kg/m?  ?11/08/20 30.79 kg/m?  ?10/03/20 30.79 kg/m?  ? ? ?Assessment/Interventions: Review of patient past medical history, allergies, medications, health status, including review of consultants reports, laboratory and other test data, was performed as part of comprehensive evaluation and provision of chronic care management services.  ? ?SDOH:  (Social Determinants of Health) assessments and interventions performed: Yes ? ?Financial Resource Strain: Low Risk   ? Difficulty of Paying Living Expenses: Not hard at all  ? ?Food Insecurity: No Food Insecurity  ? Worried About Charity fundraiser in the Last Year: Never true  ? Ran Out of Food in the Last Year: Never true  ? ? ?SDOH Screenings  ? ?Alcohol Screen: Not on file  ?Depression (PHQ2-9): Medium Risk  ? PHQ-2 Score: 13  ?Financial Resource Strain: Low Risk   ? Difficulty of Paying Living Expenses: Not hard at all  ?Food Insecurity: No Food Insecurity  ? Worried About Charity fundraiser in the Last Year: Never true  ? Ran Out of Food in the Last Year: Never true  ?Housing: Low Risk   ? Last Housing Risk Score: 0  ?Physical Activity: Inactive  ? Days of Exercise per Week: 0 days  ? Minutes of Exercise per Session: 0 min  ?Social Connections: Moderately Isolated  ? Frequency of Communication with Friends and Family: More than three times a week  ? Frequency of Social Gatherings with Friends and Family: Once a week  ? Attends Religious Services: Never  ? Active Member of Clubs or Organizations: No  ? Attends Archivist Meetings: Never  ? Marital Status: Married  ?Stress: No Stress Concern Present  ? Feeling of Stress : Only a little  ?Tobacco Use: High Risk  ? Smoking Tobacco Use: Every Day  ? Smokeless Tobacco Use: Never  ? Passive Exposure: Not on file  ?Transportation Needs: No Transportation Needs  ? Lack of Transportation (Medical): No  ?  Lack of Transportation (Non-Medical): No  ? ? ?CCM Care Plan ? ?Allergies  ?Allergen Reactions  ? Moxifloxacin Hcl Palpitations  ?  REACTION: tackycardia  ? Penicillins Rash  ?  Has patient had a PCN re

## 2021-04-18 NOTE — Patient Instructions (Addendum)
Visit Information ? ? Goals Addressed   ? ?  ?  ?  ?  ? This Visit's Progress  ?  Track and Manage My Symptoms-COPD     ?  Timeframe:  Long-Range Goal ?Priority:  High ?Start Date:  04/18/21                           ?Expected End Date: 10/18/21                      ? ?Follow Up Date 07/18/21  ?  ?- eliminate symptom triggers at home ?- follow rescue plan if symptoms flare-up  ?  ?Why is this important?   ?Tracking your symptoms and other information about your health helps your doctor plan your care.  ?Write down the symptoms, the time of day, what you were doing and what medicine you are taking.  ?You will soon learn how to manage your symptoms.   ?  ?Notes:  ?  ? ?  ? ?Patient Care Plan: General Pharmacy (Adult)  ?  ? ?Problem Identified: HTN, COPD, Fibromyalgia, Bipolar   ?Priority: High  ?Onset Date: 04/18/2021  ?  ? ?Long-Range Goal: Patient-Specific Goal   ?Start Date: 04/18/2021  ?Expected End Date: 10/18/2021  ?This Visit's Progress: On track  ?Priority: High  ?Note:   ?Current Barriers:  ?Shortness of breath ? ?Pharmacist Clinical Goal(s):  ?Patient will achieve adherence to monitoring guidelines and medication adherence to achieve therapeutic efficacy through collaboration with PharmD and provider.  ? ?Interventions: ?1:1 collaboration with [name], MD regarding development and update of comprehensive plan of care as evidenced by provider attestation and co-signature ?Inter-disciplinary care team collaboration (see longitudinal plan of care) ?Comprehensive medication review performed; medication list updated in electronic medical record ? ?Hypertension (BP goal <130/80) ?-Controlled ?-Current treatment: ?None ?-Medications previously tried: triamterene/HCTZ  ?-Denies hypotensive/hypertensive symptoms ?-Educated on BP goals and benefits of medications for prevention of heart attack, stroke and kidney damage; ?Symptoms of hypotension and importance of maintaining adequate hydration; ?-Counseled to  monitor BP at home a few times per week, document, and provide log at future appointments ?-Recommended to continue current medication ? ?COPD (Goal: control symptoms and prevent exacerbations) ?-Controlled ?-Current treatment  ?Symbicort 80-4.1mcg 2 puffs BID Appropriate, Query effective ?-Medications previously tried: montelukast  ?-MMRC/CAT score:  ?   ? View : No data to display.  ?  ?  ?  ? ?  ?-Pulmonary function testing: Pulmonary Functions Testing Results: ? ?No results found for: FEV1, FVC, FEV1FVC, TLC, DLCO  ?-Exacerbations requiring treatment in last 6 months: none ?-Patient denies consistent use of maintenance inhaler ?-Frequency of rescue inhaler use: none ?-Counseled on Proper inhaler technique; ?Benefits of consistent maintenance inhaler use ?When to use rescue inhaler ?Differences between maintenance and rescue inhalers ?-She is not currently using any inhalers, however she does mention that she gets SOB when she exerts herself like cleaning her house of a lot of walking.  Explained the purpose of Symbicort, she would like new rx for this to use as maintenance.  Will consult with PCP. ? ?Depression/Anxiety (Goal: Minimize symptoms) ?-Controlled ?-Current treatment: ?Duloxetine 120mg  QAM Appropriate, Effective, Safe, Accessible ?Abilify 10mg  Appropriate, Effective, Safe, Accessible ?-Medications previously tried/failed: Fluoxetine, Latuda, Paxil, Rexulti ?-PHQ9:  ? ?  09/22/2020  ? 10:26 AM 08/19/2020  ?  2:40 PM 04/14/2019  ?  8:32 AM  ?PHQ9 SCORE ONLY  ?PHQ-9 Total Score 13  1 1  ?  ?-GAD7:  ? ?  04/14/2019  ?  8:32 AM 01/25/2017  ?  9:00 AM 01/02/2017  ?  9:00 AM  ?GAD 7 : Generalized Anxiety Score  ?Nervous, Anxious, on Edge 1 3 3   ?Control/stop worrying 1 3 3   ?Worry too much - different things 1 3 3   ?Trouble relaxing 1 3 3   ?Restless 1 2 3   ?Easily annoyed or irritable 1 3 3   ?Afraid - awful might happen 1 3 3   ?Total GAD 7 Score 7 20 21   ?Anxiety Difficulty Somewhat difficult Extremely difficult  Extremely difficult  ?-Feels like the initiation of Duloxetine is helping.  No concerns at this time. ?She is followed by psych for management of these meds. ?-Recommended to continue current medication ? ?Fibromyalgia (Goal: Minimize pain) ?-Controlled ?-Current treatment  ?Duloxetine 120mg  qam Appropriate, Effective, Safe, Accessible ?-Medications previously tried: none noted ?-Believes Duloxetine is helping pain  ?-Recommended to continue current medication ? ?Patient Goals/Self-Care Activities ?Patient will:  ?- focus on medication adherence by fill dates, pill count ?target a minimum of 150 minutes of moderate intensity exercise weekly ? ?Follow Up Plan: The care management team will reach out to the patient again over the next 180 days.  ? ?  ? ? ?Ms. Scheib was given information about Chronic Care Management services today including:  ?CCM service includes personalized support from designated clinical staff supervised by her physician, including individualized plan of care and coordination with other care providers ?24/7 contact phone numbers for assistance for urgent and routine care needs. ?Standard insurance, coinsurance, copays and deductibles apply for chronic care management only during months in which we provide at least 20 minutes of these services. Most insurances cover these services at 100%, however patients may be responsible for any copay, coinsurance and/or deductible if applicable. This service may help you avoid the need for more expensive face-to-face services. ?Only one practitioner may furnish and bill the service in a calendar month. ?The patient may stop CCM services at any time (effective at the end of the month) by phone call to the office staff. ? ?Patient agreed to services and verbal consent obtained.  ? ?The patient verbalized understanding of instructions, educational materials, and care plan provided today and agreed to receive a mailed copy of patient instructions, educational  materials, and care plan.  ?Telephone follow up appointment with pharmacy team member scheduled for: 6 months ? ? , [facility]  ? , PharmD ?Clinical Pharmacist  ? ?[phone] ? ?

## 2021-04-18 NOTE — Assessment & Plan Note (Signed)
Stable

## 2021-04-18 NOTE — Assessment & Plan Note (Signed)
Stable.  Continue management per psychiatry. 

## 2021-04-18 NOTE — Assessment & Plan Note (Signed)
Continue management per psychiatry.  No manic symptoms today. ?

## 2021-04-18 NOTE — Assessment & Plan Note (Signed)
Check A1c.  Discussed lifestyle modifications. °

## 2021-04-18 NOTE — Assessment & Plan Note (Signed)
At goal off meds.  Check labs. 

## 2021-04-20 ENCOUNTER — Encounter: Payer: Self-pay | Admitting: Family Medicine

## 2021-04-20 ENCOUNTER — Ambulatory Visit
Admission: RE | Admit: 2021-04-20 | Discharge: 2021-04-20 | Disposition: A | Discharge status: Home / Self Care | Payer: Medicare PPO | Source: Ambulatory Visit | Attending: Family Medicine | Admitting: Family Medicine

## 2021-04-20 DIAGNOSIS — Z1231 Encounter for screening mammogram for malignant neoplasm of breast: Secondary | ICD-10-CM

## 2021-04-20 DIAGNOSIS — E785 Hyperlipidemia, unspecified: Secondary | ICD-10-CM | POA: Insufficient documentation

## 2021-04-20 NOTE — Progress Notes (Signed)
Please inform patient of the following: ? ?Her cholesterol and blood sugar are up a bit since last time but overall stable.  All of her other labs are normal. Do not need to make any medication changes at this point.  She should continue to work on diet and exercise and we can recheck in a year or so.

## 2021-04-21 ENCOUNTER — Other Ambulatory Visit: Payer: Self-pay | Admitting: Family Medicine

## 2021-04-26 ENCOUNTER — Telehealth: Payer: Self-pay | Admitting: Family Medicine

## 2021-04-26 ENCOUNTER — Other Ambulatory Visit: Payer: Self-pay

## 2021-04-26 MED ORDER — BUDESONIDE-FORMOTEROL FUMARATE 80-4.5 MCG/ACT IN AERO: 2.0000 | INHALATION_SPRAY | Freq: Two times a day (BID) | RESPIRATORY_TRACT | 3 refills | Status: DC

## 2021-04-26 NOTE — Telephone Encounter (Signed)
.. ?  Encourage patient to contact the pharmacy for refills or they can request refills through [facility] ? ?LAST APPOINTMENT DATE:  04/18/21 ? ?NEXT APPOINTMENT DATE: none on file with dr [name] ? ?MEDICATION: budesonide-formoterol (SYMBICORT) 80-4.5 MCG/ACT inhaler ? ?Patient also needs a nasal spray.  ? ?Is the patient out of medication? Yes  ? ?PHARMACY: ? ?Let patient know to contact pharmacy at the end of the day to make sure medication is ready. ? ?Please notify patient to allow 48-72 hours to process  ?

## 2021-04-26 NOTE — Telephone Encounter (Signed)
Rx sent to pharmacy   

## 2021-05-14 DIAGNOSIS — J449 Chronic obstructive pulmonary disease, unspecified: Secondary | ICD-10-CM | POA: Diagnosis not present

## 2021-05-14 DIAGNOSIS — I10 Essential (primary) hypertension: Secondary | ICD-10-CM | POA: Diagnosis not present

## 2021-05-31 ENCOUNTER — Other Ambulatory Visit: Payer: Self-pay

## 2021-05-31 ENCOUNTER — Other Ambulatory Visit: Payer: Self-pay | Admitting: Family Medicine

## 2021-06-28 DIAGNOSIS — R1013 Epigastric pain: Secondary | ICD-10-CM | POA: Diagnosis not present

## 2021-06-28 DIAGNOSIS — R197 Diarrhea, unspecified: Secondary | ICD-10-CM | POA: Diagnosis not present

## 2021-06-28 DIAGNOSIS — R1084 Generalized abdominal pain: Secondary | ICD-10-CM | POA: Diagnosis not present

## 2021-08-14 DIAGNOSIS — F3132 Bipolar disorder, current episode depressed, moderate: Secondary | ICD-10-CM | POA: Diagnosis not present

## 2021-08-14 DIAGNOSIS — G2581 Restless legs syndrome: Secondary | ICD-10-CM | POA: Diagnosis not present

## 2021-08-14 DIAGNOSIS — G2401 Drug induced subacute dyskinesia: Secondary | ICD-10-CM | POA: Diagnosis not present

## 2021-08-14 DIAGNOSIS — F419 Anxiety disorder, unspecified: Secondary | ICD-10-CM | POA: Diagnosis not present

## 2021-08-24 ENCOUNTER — Ambulatory Visit (INDEPENDENT_AMBULATORY_CARE_PROVIDER_SITE_OTHER): Payer: Medicare PPO

## 2021-08-24 ENCOUNTER — Other Ambulatory Visit: Payer: Self-pay | Admitting: Family Medicine

## 2021-08-24 DIAGNOSIS — Z0000 Encounter for general adult medical examination without abnormal findings: Secondary | ICD-10-CM

## 2021-08-24 DIAGNOSIS — R197 Diarrhea, unspecified: Secondary | ICD-10-CM | POA: Diagnosis not present

## 2021-08-24 NOTE — Patient Instructions (Signed)
Danielle Cox , Thank you for taking time to come for your Medicare Wellness Visit. I appreciate your ongoing commitment to your health goals. Please review the following plan we discussed and let me know if I can assist you in the future.   Screening recommendations/referrals: Colonoscopy: done 05/02/20 repeat every 3 years  Mammogram: completed 04/20/21 repeat every year  Recommended yearly ophthalmology/optometry visit for glaucoma screening and checkup Recommended yearly dental visit for hygiene and checkup  Vaccinations: Influenza vaccine: due  Pneumococcal vaccine: due  Tdap vaccine: done 11/17/20 repeat every 10 years  Shingles vaccine: Shingrix discussed. Please contact your pharmacy for coverage information.   Covid-19: completed 4/22,, 06/04/19  Advanced directives: Please bring a copy of your health care power of attorney and living will to the office at your convenience.  Conditions/risks identified: stop smoking   Next appointment: Follow up in one year for your annual wellness visit.   Preventive Care 40-64 Years, Female Preventive care refers to lifestyle choices and visits with your health care provider that can promote health and wellness. What does preventive care include? A yearly physical exam. This is also called an annual well check. Dental exams once or twice a year. Routine eye exams. Ask your health care provider how often you should have your eyes checked. Personal lifestyle choices, including: Daily care of your teeth and gums. Regular physical activity. Eating a healthy diet. Avoiding tobacco and drug use. Limiting alcohol use. Practicing safe sex. Taking low-dose aspirin daily starting at age 61. Taking vitamin and mineral supplements as recommended by your health care provider. What happens during an annual well check? The services and screenings done by your health care provider during your annual well check will depend on your age, overall health,  lifestyle risk factors, and family history of disease. Counseling  Your health care provider may ask you questions about your: Alcohol use. Tobacco use. Drug use. Emotional well-being. Home and relationship well-being. Sexual activity. Eating habits. Work and work Statistician. Method of birth control. Menstrual cycle. Pregnancy history. Screening  You may have the following tests or measurements: Height, weight, and BMI. Blood pressure. Lipid and cholesterol levels. These may be checked every 5 years, or more frequently if you are over 67 years old. Skin check. Lung cancer screening. You may have this screening every year starting at age 86 if you have a 30-pack-year history of smoking and currently smoke or have quit within the past 15 years. Fecal occult blood test (FOBT) of the stool. You may have this test every year starting at age 14. Flexible sigmoidoscopy or colonoscopy. You may have a sigmoidoscopy every 5 years or a colonoscopy every 10 years starting at age 28. Hepatitis C blood test. Hepatitis B blood test. Sexually transmitted disease (STD) testing. Diabetes screening. This is done by checking your blood sugar (glucose) after you have not eaten for a while (fasting). You may have this done every 1-3 years. Mammogram. This may be done every 1-2 years. Talk to your health care provider about when you should start having regular mammograms. This may depend on whether you have a family history of breast cancer. BRCA-related cancer screening. This may be done if you have a family history of breast, ovarian, tubal, or peritoneal cancers. Pelvic exam and Pap test. This may be done every 3 years starting at age 69. Starting at age 40, this may be done every 5 years if you have a Pap test in combination with an HPV test. Bone density  scan. This is done to screen for osteoporosis. You may have this scan if you are at high risk for osteoporosis. Discuss your test results, treatment  options, and if necessary, the need for more tests with your health care provider. Vaccines  Your health care provider may recommend certain vaccines, such as: Influenza vaccine. This is recommended every year. Tetanus, diphtheria, and acellular pertussis (Tdap, Td) vaccine. You may need a Td booster every 10 years. Zoster vaccine. You may need this after age 52. Pneumococcal 13-valent conjugate (PCV13) vaccine. You may need this if you have certain conditions and were not previously vaccinated. Pneumococcal polysaccharide (PPSV23) vaccine. You may need one or two doses if you smoke cigarettes or if you have certain conditions. Talk to your health care provider about which screenings and vaccines you need and how often you need them. This information is not intended to replace advice given to you by your health care provider. Make sure you discuss any questions you have with your health care provider. Document Released: 01/28/2015 Document Revised: 09/21/2015 Document Reviewed: 11/02/2014 Elsevier Interactive Patient Education  2017 [employer] Prevention in the Home Falls can cause injuries. They can happen to people of all ages. There are many things you can do to make your home safe and to help prevent falls. What can I do on the outside of my home? Regularly fix the edges of walkways and driveways and fix any cracks. Remove anything that might make you trip as you walk through a door, such as a raised step or threshold. Trim any bushes or trees on the path to your home. Use bright outdoor lighting. Clear any walking paths of anything that might make someone trip, such as rocks or tools. Regularly check to see if handrails are loose or broken. Make sure that both sides of any steps have handrails. Any raised decks and porches should have guardrails on the edges. Have any leaves, snow, or ice cleared regularly. Use sand or salt on walking paths during winter. Clean up any  spills in your garage right away. This includes oil or grease spills. What can I do in the bathroom? Use night lights. Install grab bars by the toilet and in the tub and shower. Do not use towel bars as grab bars. Use non-skid mats or decals in the tub or shower. If you need to sit down in the shower, use a plastic, non-slip stool. Keep the floor dry. Clean up any water that spills on the floor as soon as it happens. Remove soap buildup in the tub or shower regularly. Attach bath mats securely with double-sided non-slip rug tape. Do not have throw rugs and other things on the floor that can make you trip. What can I do in the bedroom? Use night lights. Make sure that you have a light by your bed that is easy to reach. Do not use any sheets or blankets that are too big for your bed. They should not hang down onto the floor. Have a firm chair that has side arms. You can use this for support while you get dressed. Do not have throw rugs and other things on the floor that can make you trip. What can I do in the kitchen? Clean up any spills right away. Avoid walking on wet floors. Keep items that you use a lot in easy-to-reach places. If you need to reach something above you, use a strong step stool that has a grab bar. Keep electrical  cords out of the way. Do not use floor polish or wax that makes floors slippery. If you must use wax, use non-skid floor wax. Do not have throw rugs and other things on the floor that can make you trip. What can I do with my stairs? Do not leave any items on the stairs. Make sure that there are handrails on both sides of the stairs and use them. Fix handrails that are broken or loose. Make sure that handrails are as long as the stairways. Check any carpeting to make sure that it is firmly attached to the stairs. Fix any carpet that is loose or worn. Avoid having throw rugs at the top or bottom of the stairs. If you do have throw rugs, attach them to the floor  with carpet tape. Make sure that you have a light switch at the top of the stairs and the bottom of the stairs. If you do not have them, ask someone to add them for you. What else can I do to help prevent falls? Wear shoes that: Do not have high heels. Have rubber bottoms. Are comfortable and fit you well. Are closed at the toe. Do not wear sandals. If you use a stepladder: Make sure that it is fully opened. Do not climb a closed stepladder. Make sure that both sides of the stepladder are locked into place. Ask someone to hold it for you, if possible. Clearly mark and make sure that you can see: Any grab bars or handrails. First and last steps. Where the edge of each step is. Use tools that help you move around (mobility aids) if they are needed. These include: Canes. Walkers. Scooters. Crutches. Turn on the lights when you go into a dark area. Replace any light bulbs as soon as they burn out. Set up your furniture so you have a clear path. Avoid moving your furniture around. If any of your floors are uneven, fix them. If there are any pets around you, be aware of where they are. Review your medicines with your doctor. Some medicines can make you feel dizzy. This can increase your chance of falling. Ask your doctor what other things that you can do to help prevent falls. This information is not intended to replace advice given to you by your health care provider. Make sure you discuss any questions you have with your health care provider. Document Released: 10/28/2008 Document Revised: 06/09/2015 Document Reviewed: 02/05/2014 Elsevier Interactive Patient Education  2017 [employer].

## 2021-08-24 NOTE — Progress Notes (Signed)
Virtual Visit via Telephone Note  I connected with  Danielle Cox on 08/24/21 at  2:30 PM EDT by telephone and verified that I am speaking with the correct person using two identifiers.  Medicare Annual Wellness visit completed telephonically due to Covid-19 pandemic.   Persons participating in this call: This Health Coach and this patient.   Location: Patient: home Provider: office    I discussed the limitations, risks, security and privacy concerns of performing an evaluation and management service by telephone and the availability of in person appointments. The patient expressed understanding and agreed to proceed.  Unable to perform video visit due to video visit attempted and failed and/or patient does not have video capability.   Some vital signs may be absent or patient reported.   Danielle Schlein, LPN   Subjective:   Danielle Cox is a 61 y.o. female who presents for Medicare Annual (Subsequent) preventive examination.  Review of Systems           Objective:    There were no vitals filed for this visit. There is no height or weight on file to calculate BMI.     08/24/2021    2:29 PM 08/19/2020    2:42 PM 05/16/2019    9:53 AM 01/22/2019    9:17 AM 01/04/2017    9:47 AM 12/13/2016    7:15 PM 12/13/2016    1:47 AM  Advanced Directives  Does Patient Have a Medical Advance Directive? Yes Yes No No   No  Type of Advance Directive Living will Healthcare Power of Attorney       Copy of Healthcare Power of Attorney in Chart?  No - copy requested       Would patient like information on creating a medical advance directive?    Yes (MAU/Ambulatory/Procedural Areas - Information given)   No - Patient declined     Information is confidential and restricted. Go to Review Flowsheets to unlock data.    Current Medications (verified) Outpatient Encounter Medications as of 08/24/2021  Medication Sig   acetaminophen (TYLENOL) 500 MG tablet Take 2 tablets (1,000 mg total) by  mouth every 6 (six) hours as needed for mild pain or headache.   ARIPiprazole (ABILIFY) 10 MG tablet Take 10 mg by mouth in the morning and at bedtime.   atorvastatin (LIPITOR) 20 MG tablet TAKE ONE TABLET BY MOUTH DAILY   dicyclomine (BENTYL) 20 MG tablet Take 20 mg by mouth in the morning and at bedtime.   DULoxetine (CYMBALTA) 60 MG capsule Take 120 mg by mouth daily. 120 mg in the am   hydrOXYzine (ATARAX/VISTARIL) 25 MG tablet Take 25 mg by mouth 3 (three) times daily as needed.   modafinil (PROVIGIL) 200 MG tablet Take 1 tablet (200 mg total) by mouth in the morning and at bedtime.   omeprazole (PRILOSEC) 20 MG capsule Take 20 mg by mouth daily.   PARoxetine (PAXIL) 20 MG tablet Take 20 mg by mouth daily.   rOPINIRole (REQUIP) 4 MG tablet Take by mouth in the morning and at bedtime.   SYMBICORT 80-4.5 MCG/ACT inhaler INHALE TWO PUFFS BY MOUTH TWICE A DAY   TEMAZEPAM PO Take by mouth.   traZODone (DESYREL) 150 MG tablet Take by mouth at bedtime.   valbenazine (INGREZZA) 80 MG capsule Take by mouth.   [DISCONTINUED] omeprazole (PRILOSEC) 20 MG capsule Take 1 capsule by mouth daily.   No facility-administered encounter medications on file as of 08/24/2021.    Allergies (  verified) Moxifloxacin hcl, Penicillins, and Corticosteroids   History: Past Medical History:  Diagnosis Date   Anxiety    Bipolar disorder (HCC)    Bronchitis, chronic (HCC)    COPD (chronic obstructive pulmonary disease) (HCC)    Depression    Personality disorder (HCC)    Past Surgical History:  Procedure Laterality Date   CARPAL TUNNEL RELEASE Right    CESAREAN SECTION  [id number], [id number], [id number]   TONSILLECTOMY  age 61   Family History  Problem Relation Age of Onset   Depression Mother    Alcohol abuse Mother    Depression Father    Alcohol abuse Father    Alcohol abuse Sister    Depression Sister    Alcohol abuse Brother    Depression Brother        overdose   Breast cancer Neg Hx    Social History    Socioeconomic History   Marital status: Married    Spouse name: Onalee HuaDavid   Number of children: 3   Years of education: Not on file   Highest education level: GED or equivalent  Occupational History   Occupation: disabled  Tobacco Use   Smoking status: Every Day    Packs/day: 1.50    Years: 40.00    Total pack years: 60.00    Types: Cigarettes   Smokeless tobacco: Never  Vaping Use   Vaping Use: Never used  Substance and Sexual Activity   Alcohol use: No   Drug use: No   Sexual activity: Not Currently    Partners: Male    Birth control/protection: None  Other Topics Concern   Not on file  Social History Narrative   10/29/2012 [name] [name] was born and grew up [place], [place]. She has 2 brothers and one sister. She reports that her childhood was "poor," and explains that she needs both financially and emotionally. She completed the 10th grade, then achieved her GED. She has been married twice. The first time at age 61, and that ended after 1 year. She is currently married to her second husband of 27 years. She has 2 sons and one daughter. She is currently unemployed and on disability for 2 years. She lives with her husband, her daughter, and one of her daughter's friends. She denies any legal difficulties. She reports that she is spiritual but not religious. Her hobbies include reading and cross stitch. She reports that she has no social support network. 10/29/2012 [name]      Patient is right-handed.she lives with her husband ina one story home with a few steps to enter. She drinks 2 cups of tea a day. She is active around the home.   Social Determinants of Health   Financial Resource Strain: Low Risk  (08/24/2021)   Overall Financial Resource Strain (CARDIA)    Difficulty of Paying Living Expenses: Not hard at all  Food Insecurity: No Food Insecurity (08/24/2021)   Hunger Vital Sign    Worried About Running Out of Food in the Last Year: Never true    Ran Out of Food in the Last Year:  Never true  Transportation Needs: No Transportation Needs (08/24/2021)   PRAPARE - Administrator, Civil ServiceTransportation    Lack of Transportation (Medical): No    Lack of Transportation (Non-Medical): No  Physical Activity: Sufficiently Active (08/24/2021)   Exercise Vital Sign    Days of Exercise per Week: 5 days    Minutes of Exercise per Session: 30 min  Stress: No Stress Concern Present (08/24/2021)  [employer] of Occupational Health - Occupational Stress Questionnaire    Feeling of Stress : Only a little  Social Connections: Moderately Isolated (08/24/2021)   Social Connection and Isolation Panel [NHANES]    Frequency of Communication with Friends and Family: More than three times a week    Frequency of Social Gatherings with Friends and Family: More than three times a week    Attends Religious Services: Never    Database administrator or Organizations: No    Attends Engineer, structural: Never    Marital Status: Married    Tobacco Counseling Ready to quit: Not Answered Counseling given: Not Answered   Clinical Intake:  Pre-visit preparation completed: Yes  Pain : No/denies pain     BMI - recorded: 29.85 Nutritional Status: BMI 25 -29 Overweight Nutritional Risks: None Diabetes: No  How often do you need to have someone help you when you read instructions, pamphlets, or other written materials from your doctor or pharmacy?: 1 - Never  Diabetic?no  Interpreter Needed?: No  Information entered by :: Lanier Ensign, LPN   Activities of Daily Living    08/24/2021    2:30 PM  In your present state of health, do you have any difficulty performing the following activities:  Hearing? 0  Vision? 0  Difficulty concentrating or making decisions? 0  Walking or climbing stairs? 0  Dressing or bathing? 0  Doing errands, shopping? 0  Preparing Food and eating ? N  Using the Toilet? N  In the past six months, have you accidently leaked urine? N  Do you have problems with  loss of bowel control? N  Managing your Medications? N  Managing your Finances? N  Housekeeping or managing your Housekeeping? N    Patient Care Team: [name], MD as PCP - General (Family Medicine) Andrena Mews, DO as Consulting Physician (Family Medicine) Lesle Reek, DDS (Dental General Practice) Ellis Savage, NP as Nurse Practitioner Drema Dallas, DO as Consulting Physician (Neurology) [facility] (Pain Medicine) Archer Asa, MD as Consulting Physician (Psychiatry) Ardell Isaacs, MD as Consulting Physician (Pain Medicine) Erroll Luna, [facility] as Pharmacist (Pharmacist)  Indicate any recent Medical Services you may have received from other than [facility] providers in the past year (date may be approximate).     Assessment:   This is a routine wellness examination for Danielle Cox.  Hearing/Vision screen Hearing Screening - Comments:: Pt denies nay hearing issues  Vision Screening - Comments:: Pt will follow up with [facility]   Dietary issues and exercise activities discussed:     Goals Addressed             This Visit's Progress    Patient Stated       Stop smoking        Depression Screen    08/24/2021    2:27 PM 09/22/2020   10:26 AM 08/19/2020    2:40 PM 04/14/2019    8:32 AM 08/13/2018    2:46 PM 09/18/2017    8:57 AM 01/25/2017   12:09 PM  PHQ 2/9 Scores  PHQ - 2 Score 6 6 1  0 4 4   PHQ- 9 Score 15 13  1 11 10       Information is confidential and restricted. Go to Review Flowsheets to unlock data.    Fall Risk    08/24/2021    2:29 PM 09/22/2020   10:27 AM 08/19/2020    2:43 PM 06/30/2019  9:17 AM 01/22/2019    9:25 AM  Fall Risk   Falls in the past year? 0 0 0 1 0  Number falls in past yr: 0 0 0 0   Injury with Fall? 0 0 0 1 0  Risk for fall due to : Impaired balance/gait  Impaired vision;Impaired balance/gait;Impaired mobility    Risk for fall due to: Comment dizzy at times      Follow up Falls prevention  discussed  Falls prevention discussed  Education provided;Falls prevention discussed;Falls evaluation completed    FALL RISK PREVENTION PERTAINING TO THE HOME:  Any stairs in or around the home? Yes  If so, are there any without handrails? No  Home free of loose throw rugs in walkways, pet beds, electrical cords, etc? Yes  Adequate lighting in your home to reduce risk of falls? Yes   ASSISTIVE DEVICES UTILIZED TO PREVENT FALLS:  Life alert? No  Use of a cane, walker or w/c? No  Grab bars in the bathroom? No  Shower chair or bench in shower? No  Elevated toilet seat or a handicapped toilet? No   TIMED UP AND GO:  Was the test performed? No .   Cognitive Function:        08/24/2021    2:31 PM 08/19/2020    2:44 PM  6CIT Screen  What Year? 0 points 0 points  What month? 0 points 0 points  What time? 0 points 0 points  Count back from 20 0 points 0 points  Months in reverse 0 points 4 points  Repeat phrase 6 points 6 points  Total Score 6 points 10 points    Immunizations Immunization History  Administered Date(s) Administered   Moderna Sars-Covid-2 Vaccination 05/07/2019, 06/04/2019   Tdap 11/17/2020    TDAP status: Up to date  Flu Vaccine status: Due, Education has been provided regarding the importance of this vaccine. Advised may receive this vaccine at local pharmacy or Health Dept. Aware to provide a copy of the vaccination record if obtained from local pharmacy or Health Dept. Verbalized acceptance and understanding.  Pneumococcal vaccine status: Due, Education has been provided regarding the importance of this vaccine. Advised may receive this vaccine at local pharmacy or Health Dept. Aware to provide a copy of the vaccination record if obtained from local pharmacy or Health Dept. Verbalized acceptance and understanding.  Covid-19 vaccine status: Completed vaccines  Qualifies for Shingles Vaccine? Yes   Zostavax completed No   Shingrix Completed?: No.     Education has been provided regarding the importance of this vaccine. Patient has been advised to call insurance company to determine out of pocket expense if they have not yet received this vaccine. Advised may also receive vaccine at local pharmacy or Health Dept. Verbalized acceptance and understanding.  Screening Tests Health Maintenance  Topic Date Due   Zoster Vaccines- Shingrix (1 of 2) Never done   COVID-19 Vaccine (3 - Moderna risk series) 07/02/2019   PAP SMEAR-Modifier  03/02/2021   Fecal DNA (Cologuard)  04/19/2022 (Originally 12/02/2020)   INFLUENZA VACCINE  02/18/2024 (Originally 08/15/2021)   TETANUS/TDAP  11/18/2030   Hepatitis C Screening  Completed   HIV Screening  Completed   HPV VACCINES  Aged Out   MAMMOGRAM  Discontinued    Health Maintenance  Health Maintenance Due  Topic Date Due   Zoster Vaccines- Shingrix (1 of 2) Never done   COVID-19 Vaccine (3 - Moderna risk series) 07/02/2019   PAP SMEAR-Modifier  03/02/2021  Colorectal cancer screening: Type of screening: Colonoscopy. Completed 05/02/20. Repeat every 3 years  Mammogram status: Completed 04/20/21. Repeat every year    Lung Cancer Screening: (Low Dose CT Chest recommended if Age 69-80 years, 30 pack-year currently smoking OR have quit w/in 15years.) does qualify.   Lung Cancer Screening Referral: not at this time   Additional Screening:  Hepatitis C Screening:  Completed 02/11/18  Vision Screening: Recommended annual ophthalmology exams for early detection of glaucoma and other disorders of the eye. Is the patient up to date with their annual eye exam?  Yes  Who is the provider or what is the name of the office in which the patient attends annual eye exams? [facility]  If pt is not established with a provider, would they like to be referred to a provider to establish care? No .   Dental Screening: Recommended annual dental exams for proper oral hygiene  Community Resource Referral / Chronic Care  Management: CRR required this visit?  No   CCM required this visit?  No      Plan:     I have personally reviewed and noted the following in the patient's chart:   Medical and social history Use of alcohol, tobacco or illicit drugs  Current medications and supplements including opioid prescriptions.  Functional ability and status Nutritional status Physical activity Advanced directives List of other physicians Hospitalizations, surgeries, and ER visits in previous 12 months Vitals Screenings to include cognitive, depression, and falls Referrals and appointments  In addition, I have reviewed and discussed with patient certain preventive protocols, quality metrics, and best practice recommendations. A written personalized care plan for preventive services as well as general preventive health recommendations were provided to patient.     Danielle Schlein, LPN   [id number]/82/9562   Nurse Notes: none

## 2021-08-25 ENCOUNTER — Ambulatory Visit: Payer: Medicare PPO

## 2021-10-04 DIAGNOSIS — R197 Diarrhea, unspecified: Secondary | ICD-10-CM | POA: Diagnosis not present

## 2021-10-04 DIAGNOSIS — R1084 Generalized abdominal pain: Secondary | ICD-10-CM | POA: Diagnosis not present

## 2021-10-04 DIAGNOSIS — Z86010 Personal history of colonic polyps: Secondary | ICD-10-CM | POA: Diagnosis not present

## 2021-10-04 DIAGNOSIS — R1013 Epigastric pain: Secondary | ICD-10-CM | POA: Diagnosis not present

## 2021-10-12 DIAGNOSIS — F315 Bipolar disorder, current episode depressed, severe, with psychotic features: Secondary | ICD-10-CM | POA: Diagnosis not present

## 2021-10-12 DIAGNOSIS — G2401 Drug induced subacute dyskinesia: Secondary | ICD-10-CM | POA: Diagnosis not present

## 2021-10-12 DIAGNOSIS — F419 Anxiety disorder, unspecified: Secondary | ICD-10-CM | POA: Diagnosis not present

## 2021-10-18 NOTE — Progress Notes (Signed)
Chronic Care Management Pharmacy Note Cholestipol 1g 2 AM    10/31/2021 Name:  Danielle Cox MRN:  [id number] DOB:  April 08, 1960  Summary: Patient now taking her Symbicort since last visit - breathing has improved.  She mentions increased fibromyalgia pain.  Has changed around many of her psych meds since last appt.  Updated med list  Recommendations/Changes made from today's visit: No changes - consider increase to Cymbalta for fibromyalgia pain  Plan: FU 6 months   Subjective: Danielle Cox is an 60 y.o. year old female who is a primary patient of [name], MD.  The CCM team was consulted for assistance with disease management and care coordination needs.    Engaged with patient by telephone for follow up visit in response to provider referral for pharmacy case management and/or care coordination services.   Consent to Services:  The patient was given the following information about Chronic Care Management services today, agreed to services, and gave verbal consent: 1. CCM service includes personalized support from designated clinical staff supervised by the primary care provider, including individualized plan of care and coordination with other care providers 2. 24/7 contact phone numbers for assistance for urgent and routine care needs. 3. Service will only be billed when office clinical staff spend 20 minutes or more in a month to coordinate care. 4. Only one practitioner may furnish and bill the service in a calendar month. 5.The patient may stop CCM services at any time (effective at the end of the month) by phone call to the office staff. 6. The patient will be responsible for cost sharing (co-pay) of up to 20% of the service fee (after annual deductible is met). Patient agreed to services and consent obtained.  Patient Care Team: [name], MD as PCP - General (Family Medicine) Andrena Mews, DO as Consulting Physician (Family Medicine) Lesle Reek, DDS  (Dental General Practice) Ellis Savage, NP as Nurse Practitioner Drema Dallas, DO as Consulting Physician (Neurology) Care, [facility] (Pain Medicine) Archer Asa, MD as Consulting Physician (Psychiatry) Ardell Isaacs, MD as Consulting Physician (Pain Medicine) Erroll Luna, [facility] as Pharmacist (Pharmacist)  Recent office visits:  None   Recent consult visits:  11/08/2020 OV (Sports Medicine) Richardean Sale, DO; - I would like to start duloxetine 30 mg daily as I believe this would help with patient's multiple musculoskeletal complaints and fibromyalgia, however patient has past medical history of bipolar disorder.  Patient to reach out to her psychiatrist and ask if it is okay if she starts Cymbalta 30 mg.  She can contact our office and if it is approved, then we may prescribe Cymbalta 30 mg daily, 60 tablets, no refills.   Hospital visits:  None in previous 6 months  Objective:  Lab Results  Component Value Date   CREATININE 0.79 04/18/2021   BUN 17 04/18/2021   GFR 82.20 04/18/2021   GFRNONAA >60 05/15/2019   GFRAA >60 05/15/2019   NA 143 04/18/2021   K 4.4 04/18/2021   CALCIUM 9.8 04/18/2021   CO2 29 04/18/2021   GLUCOSE 111 (H) 04/18/2021    Lab Results  Component Value Date/Time   HGBA1C 5.7 04/18/2021 12:17 PM   HGBA1C 6.0 04/05/2020 11:51 AM   GFR 82.20 04/18/2021 12:17 PM   GFR 96.07 04/05/2020 11:51 AM    Last diabetic Eye exam: No results found for: "HMDIABEYEEXA"  Last diabetic Foot exam: No results found for: "HMDIABFOOTEX"   Lab Results  Component Value Date   CHOL 207 (H) 04/18/2021   HDL 56.20 04/18/2021   LDLCALC 128 (H) 04/18/2021   LDLDIRECT 173.0 02/11/2018   TRIG 116.0 04/18/2021   CHOLHDL 4 04/18/2021       Latest Ref Rng & Units 04/18/2021   12:17 PM 04/05/2020   11:51 AM 05/15/2019   12:46 PM  Hepatic Function  Total Protein 6.0 - 8.3 g/dL 6.4  6.5  6.7   Albumin 3.5 - 5.2 g/dL 4.6  4.4  4.2   AST 0 -  37 U/L 15  15  25    ALT 0 - 35 U/L 23  24  34   Alk Phosphatase 39 - 117 U/L 62  72  60   Total Bilirubin 0.2 - 1.2 mg/dL 0.3  0.3  0.5     Lab Results  Component Value Date/Time   TSH 1.00 04/18/2021 12:17 PM   TSH 0.75 04/05/2020 11:51 AM   FREET4 0.82 08/20/2018 09:31 AM       Latest Ref Rng & Units 04/18/2021   12:17 PM 04/05/2020   11:51 AM 05/15/2019   12:46 PM  CBC  WBC 4.0 - 10.5 K/uL 9.6  7.9  11.3   Hemoglobin 12.0 - 15.0 g/dL 15.3  15.5  15.0   Hematocrit 36.0 - 46.0 % 44.8  45.4  45.4   Platelets 150.0 - 400.0 K/uL 269.0  248.0  278     Lab Results  Component Value Date/Time   VD25OH 32.99 04/18/2021 12:17 PM   VD25OH 27.69 (L) 05/02/2016 09:42 AM    Clinical ASCVD: No  The 10-year ASCVD risk score (Arnett DK, et al., 2019) is: 6%   Values used to calculate the score:     Age: 70 years     Sex: Female     Is Non-Hispanic African American: No     Diabetic: No     Tobacco smoker: Yes     Systolic Blood Pressure: 037 mmHg     Is BP treated: No     HDL Cholesterol: 56.2 mg/dL     Total Cholesterol: 207 mg/dL       08/24/2021    2:27 PM 09/22/2020   10:26 AM 08/19/2020    2:40 PM  Depression screen PHQ 2/9  Decreased Interest 3 3 0  Down, Depressed, Hopeless 3 3 1   PHQ - 2 Score 6 6 1   Altered sleeping 0 0   Tired, decreased energy 2 2   Change in appetite 3 3   Feeling bad or failure about yourself  2 0   Trouble concentrating 2 2   Moving slowly or fidgety/restless 0 0   Suicidal thoughts 0 0   PHQ-9 Score 15 13   Difficult doing work/chores Somewhat difficult Very difficult       Social History   Tobacco Use  Smoking Status Every Day   Packs/day: 1.50   Years: 40.00   Total pack years: 60.00   Types: Cigarettes  Smokeless Tobacco Never   BP Readings from Last 3 Encounters:  04/18/21 123/81  11/08/20 140/90  10/03/20 138/76   Pulse Readings from Last 3 Encounters:  04/18/21 (!) 105  11/08/20 96  10/03/20 (!) 103   Wt Readings from  Last 3 Encounters:  04/18/21 179 lb 6.4 oz (81.4 kg)  11/08/20 185 lb (83.9 kg)  10/03/20 185 lb (83.9 kg)   BMI Readings from Last 3 Encounters:  04/18/21 29.85 kg/m  11/08/20 30.79 kg/m  10/03/20 30.79 kg/m    Assessment/Interventions: Review of patient past medical history, allergies, medications, health status, including review of consultants reports, laboratory and other test data, was performed as part of comprehensive evaluation and provision of chronic care management services.   SDOH:  (Social Determinants of Health) assessments and interventions performed: No, done within the last year Financial Resource Strain: Low Risk  (08/24/2021)   Overall Financial Resource Strain (CARDIA)    Difficulty of Paying Living Expenses: Not hard at all    SDOH Interventions    Flowsheet Row Clinical Support from 08/24/2021 in [place] Visit from 08/13/2018 in [place] from 01/25/2017 in confidential department Counselor from 01/17/2017 in confidential department Counselor from 01/04/2017 in confidential department  SDOH Interventions       Depression Interventions/Treatment  Counseling, Medication, Currently on Treatment Medication, Counseling --  [Patient transitioning to IOP] --  [Patient still attending PHP] Currently on Treatment      Financial Resource Strain: Low Risk  (08/24/2021)   Overall Financial Resource Strain (CARDIA)    Difficulty of Paying Living Expenses: Not hard at all   Food Insecurity: No Food Insecurity (08/24/2021)   Hunger Vital Sign    Worried About Running Out of Food in the Last Year: Never true    [place] in the Last Year: Never true    [place]: No Food Insecurity (08/24/2021)  Housing: Low Risk  (08/24/2021)  Transportation Needs: No Transportation Needs (08/24/2021)  Alcohol Screen: Low Risk  (12/13/2016)  Depression (PHQ2-9): High Risk (08/24/2021)   Financial Resource Strain: Low Risk  (08/24/2021)  Physical Activity: Sufficiently Active (08/24/2021)  Social Connections: Moderately Isolated (08/24/2021)  Stress: No Stress Concern Present (08/24/2021)  Tobacco Use: High Risk (08/24/2021)    CCM Care Plan  Allergies  Allergen Reactions   Moxifloxacin Hcl Palpitations    REACTION: tackycardia   Penicillins Rash    Has patient had a PCN reaction causing immediate rash, facial/tongue/throat swelling, SOB or lightheadedness with hypotension: Yes Has patient had a PCN reaction causing severe rash involving mucus membranes or skin necrosis: No Has patient had a PCN reaction that required hospitalization: No Has patient had a PCN reaction occurring within the last 10 years: No If all of the above answers are "NO", then may proceed with Cephalosporin use.    Corticosteroids Other (See Comments)    [place]    Medications Reviewed Today     Reviewed by Edythe Clarity, [facility] (Pharmacist) on 10/31/21 at [place] List Status: <None>   Medication Order Taking? Sig Documenting Provider Last Dose Status Informant  acetaminophen (TYLENOL) 500 MG tablet [id number]  Take 2 tablets (1,000 mg total) by mouth every 6 (six) hours as needed for mild pain or headache. Rankin, Shuvon B, NP  Active   ARIPiprazole (ABILIFY) 10 MG tablet [id number]  Take 10 mg by mouth in the morning and at bedtime. [provider]  Active Self  atorvastatin (LIPITOR) 20 MG tablet [id number]  TAKE ONE TABLET BY MOUTH DAILY Vivi Barrack, MD  Active   Deutetrabenazine ER (AUSTEDO XR) 24 MG TB24 [id number] Yes Take 24 mg by mouth daily. [provider]  Active   diazepam (VALIUM) 2 MG tablet [id number] Yes Take 2 mg by mouth every 12 (twelve) hours as needed for anxiety. [provider]  Active   dicyclomine (BENTYL) 20 MG tablet [id number]  Take 20 mg by mouth in  the morning and at bedtime. [provider]  Active   DULoxetine (CYMBALTA) 60 MG  capsule [id number]  Take 60 mg by mouth daily. 120 mg in the am [provider]  Active   hydrOXYzine (ATARAX/VISTARIL) 25 MG tablet [id number]  Take 25 mg by mouth 3 (three) times daily as needed. [provider]  Active   modafinil (PROVIGIL) 200 MG tablet [id number]  Take 1 tablet (200 mg total) by mouth in the morning and at bedtime.  Patient taking differently: Take 200 mg by mouth in the morning.   Vivi Barrack, MD  Active   omeprazole (PRILOSEC) 20 MG capsule [id number]  Take 20 mg by mouth daily. [provider]  Active   PARoxetine (PAXIL) 20 MG tablet [id number] No Take 20 mg by mouth daily.  Patient not taking: Reported on 10/31/2021   [provider] Not Taking Active   rOPINIRole (REQUIP) 4 MG tablet [id number]  Take by mouth in the morning and at bedtime. [provider]  Active   SYMBICORT 80-4.5 MCG/ACT inhaler [id number]  INHALE TWO PUFFS BY MOUTH TWICE A DAY Vivi Barrack, MD  Active   TEMAZEPAM PO [id number]  Take by mouth. [provider]  Active   traZODone (DESYREL) 150 MG tablet [id number]  Take by mouth at bedtime. [provider]  Active   valbenazine [facility]) 80 MG capsule [id number] No Take by mouth.  Patient not taking: Reported on 10/31/2021   [provider] Not Taking Active             Patient Active Problem List   Diagnosis Date Noted   Dyslipidemia 04/20/2021   Gastrointestinal hemorrhage 04/05/2020   Bipolar affective disorder ([address]) 05/17/2019   Seasonal allergic rhinitis due to pollen 08/24/2018   Arthralgia of multiple joints 08/24/2018   Insulin resistance 08/24/2018   Cervical radiculopathy 12/29/2017   Lumbar back pain 12/29/2017   Fibromyalgia 11/21/2017   COPD (chronic obstructive pulmonary disease) ([place]) 11/21/2017   Personality disorder ([place]) 04/23/2013   Nicotine dependence with current use 02/17/2009   Essential hypertension 02/17/2009   Cough 02/17/2009     Immunization History  Administered Date(s) Administered   Moderna Sars-Covid-2 Vaccination 05/07/2019, 06/04/2019   Tdap 11/17/2020    Conditions to be addressed/monitored:  HTN, COPD, Fibromyalgia, Bipolar  Care Plan : General Pharmacy (Adult)  Updates made by Edythe Clarity, RPH since 10/31/2021 12:00 AM     Problem: HTN, COPD, Fibromyalgia, Bipolar   Priority: High  Onset Date: 04/18/2021     Long-Range Goal: Patient-Specific Goal   Start Date: 04/18/2021  Expected End Date: 10/18/2021  Recent Progress: On track  Priority: High  Note:   Current Barriers:  Fibromyalgia pain  Pharmacist Clinical Goal(s):  Patient will achieve adherence to monitoring guidelines and medication adherence to achieve therapeutic efficacy through collaboration with PharmD and provider.   Interventions: 1:1 collaboration with Vivi Barrack, MD regarding development and update of comprehensive plan of care as evidenced by provider attestation and co-signature Inter-disciplinary care team collaboration (see longitudinal plan of care) Comprehensive medication review performed; medication list updated in electronic medical record  Hypertension (BP goal <130/80) -Controlled, not assessed -Current treatment: None -Medications previously tried: triamterene/HCTZ  -Denies hypotensive/hypertensive symptoms -Educated on BP goals and benefits of medications for prevention of heart attack, stroke and kidney damage; Symptoms of hypotension and importance of maintaining adequate hydration; -Counseled to monitor BP at home a few times per week, document, and provide log at  future appointments -Recommended to continue current medication  COPD (Goal: control symptoms and prevent exacerbations) 10/31/21 -Controlled, now using Symbicort daily as maintenance -Current treatment  Symbicort 80-4.25mcg 2 puffs BID Appropriate, Query effective -Medications previously tried: montelukast  -MMRC/CAT score:       View : No data to display.         -Pulmonary function testing: Pulmonary Functions Testing Results: No results found for: FEV1, FVC, FEV1FVC, TLC, DLCO  -Exacerbations requiring treatment in last 6 months: none -Patient denies consistent use of maintenance inhaler -Frequency of rescue inhaler use: none -Counseled on Proper inhaler technique; Benefits of consistent maintenance inhaler use When to use rescue inhaler Differences between maintenance and rescue inhalers She is using her Symbicort daily and reports this has helped her breathing.  She does not currently have rescue and does not feel like he she needs it at this time. No changes to her meds at this time for COPD - continue Symbicort.  Depression/Anxiety (Goal: Minimize symptoms) -Controlled, not assessed -Current treatment: Duloxetine 60mg  QAM Appropriate, Effective, Safe, Accessible Abilify 10mg  Appropriate, Effective, Safe, Accessible -Medications previously tried/failed: Fluoxetine, Latuda, Paxil, Rexulti -PHQ9:     09/22/2020   10:26 AM 08/19/2020    2:40 PM 04/14/2019    8:32 AM  PHQ9 SCORE ONLY  PHQ-9 Total Score 13 1 1     -GAD7:     04/14/2019    8:32 AM 01/25/2017    9:00 AM 01/02/2017    9:00 AM  GAD 7 : Generalized Anxiety Score  Nervous, Anxious, on Edge 1 3 3   Control/stop worrying 1 3 3   Worry too much - different things 1 3 3   Trouble relaxing 1 3 3   Restless 1 2 3   Easily annoyed or irritable 1 3 3   Afraid - awful might happen 1 3 3   Total GAD 7 Score 7 20 21   Anxiety Difficulty Somewhat difficult Extremely difficult Extremely difficult  -Feels like the initiation of Duloxetine is helping.  No concerns at this time. She is followed by psych for management of these meds. -Recommended to continue current medication  Fibromyalgia (Goal: Minimize pain) 10/31/21 -Controlled -Current treatment  Duloxetine 60 mg qam Appropriate, Effective, Safe, Accessible -Medications previously tried: none  noted -Believes Duloxetine is helping pain  -Recommended to continue current medication -Having increased fibromyalgia pain since cutting back on her Cymbalta.  Discussed potentially increasing but she sees psych regularly and will see her soon regarding increasing this. No changes at this time - continue to follow psych regularly.   Patient Goals/Self-Care Activities Patient will:  - focus on medication adherence by fill dates, pill count target a minimum of 150 minutes of moderate intensity exercise weekly  Follow Up Plan: The care management team will reach out to the patient again over the next 180 days.            Medication Assistance: None required.  Patient affirms current coverage meets needs.  Compliance/Adherence/Medication fill history: Care Gaps: none  Star-Rating Drugs: Atorvastatin 20 mg last filled 08/26/21 90 DS  Patient's preferred pharmacy is:  [facility], [address] [place] [address] Phone: [phone] Fax: [place] [phone], [place] [address] Phone: [phone] Fax: [place], [place]  [address] Phone: [phone] Fax: [phone]   We discussed: Benefits of medication synchronization, packaging and delivery as well as enhanced pharmacist oversight with [employer]. Patient decided to: Continue current medication management strategy  Care Plan and Follow Up Patient Decision:  Patient agrees to Care Plan and Follow-up.  Plan: The care management team will reach out to the patient again over the next 180 days.  Beverly Milch, PharmD Clinical Pharmacist  [facility] [phone]

## 2021-10-31 ENCOUNTER — Ambulatory Visit: Payer: Medicare PPO | Admitting: Pharmacist

## 2021-10-31 DIAGNOSIS — M797 Fibromyalgia: Secondary | ICD-10-CM

## 2021-10-31 DIAGNOSIS — J449 Chronic obstructive pulmonary disease, unspecified: Secondary | ICD-10-CM

## 2021-10-31 NOTE — Patient Instructions (Addendum)
Visit Information   Goals Addressed             This Visit's Progress    Track and Manage My Symptoms-COPD   On track    Timeframe:  Long-Range Goal Priority:  High Start Date:  04/18/21                           Expected End Date: 10/18/21                       Follow Up Date 07/18/21    - eliminate symptom triggers at home - follow rescue plan if symptoms flare-up    Why is this important?   Tracking your symptoms and other information about your health helps your doctor plan your care.  Write down the symptoms, the time of day, what you were doing and what medicine you are taking.  You will soon learn how to manage your symptoms.     Notes:        Patient Care Plan: General Pharmacy (Adult)     Problem Identified: HTN, COPD, Fibromyalgia, Bipolar   Priority: High  Onset Date: 04/18/2021     Long-Range Goal: Patient-Specific Goal   Start Date: 04/18/2021  Expected End Date: 10/18/2021  Recent Progress: On track  Priority: High  Note:   Current Barriers:  Fibromyalgia pain  Pharmacist Clinical Goal(s):  Patient will achieve adherence to monitoring guidelines and medication adherence to achieve therapeutic efficacy through collaboration with PharmD and provider.   Interventions: 1:1 collaboration with [name], MD regarding development and update of comprehensive plan of care as evidenced by provider attestation and co-signature Inter-disciplinary care team collaboration (see longitudinal plan of care) Comprehensive medication review performed; medication list updated in electronic medical record  Hypertension (BP goal <130/80) -Controlled, not assessed -Current treatment: None -Medications previously tried: triamterene/HCTZ  -Denies hypotensive/hypertensive symptoms -Educated on BP goals and benefits of medications for prevention of heart attack, stroke and kidney damage; Symptoms of hypotension and importance of maintaining adequate  hydration; -Counseled to monitor BP at home a few times per week, document, and provide log at future appointments -Recommended to continue current medication  COPD (Goal: control symptoms and prevent exacerbations) 10/31/21 -Controlled, now using Symbicort daily as maintenance -Current treatment  Symbicort 80-4.60mcg 2 puffs BID Appropriate, Query effective -Medications previously tried: montelukast  -MMRC/CAT score:      View : No data to display.         -Pulmonary function testing: Pulmonary Functions Testing Results: No results found for: FEV1, FVC, FEV1FVC, TLC, DLCO  -Exacerbations requiring treatment in last 6 months: none -Patient denies consistent use of maintenance inhaler -Frequency of rescue inhaler use: none -Counseled on Proper inhaler technique; Benefits of consistent maintenance inhaler use When to use rescue inhaler Differences between maintenance and rescue inhalers She is using her Symbicort daily and reports this has helped her breathing.  She does not currently have rescue and does not feel like he she needs it at this time. No changes to her meds at this time for COPD - continue Symbicort.  Depression/Anxiety (Goal: Minimize symptoms) -Controlled, not assessed -Current treatment: Duloxetine 60mg  QAM Appropriate, Effective, Safe, Accessible Abilify 10mg  Appropriate, Effective, Safe, Accessible -Medications previously tried/failed: Fluoxetine, Latuda, Paxil, Rexulti -PHQ9:     09/22/2020   10:26 AM 08/19/2020    2:40 PM 04/14/2019    8:32 AM  PHQ9 SCORE ONLY  PHQ-9 Total  Score 13 1 1     -GAD7:     04/14/2019    8:32 AM 01/25/2017    9:00 AM 01/02/2017    9:00 AM  GAD 7 : Generalized Anxiety Score  Nervous, Anxious, on Edge 1 3 3   Control/stop worrying 1 3 3   Worry too much - different things 1 3 3   Trouble relaxing 1 3 3   Restless 1 2 3   Easily annoyed or irritable 1 3 3   Afraid - awful might happen 1 3 3   Total GAD 7 Score 7 20 21   Anxiety  Difficulty Somewhat difficult Extremely difficult Extremely difficult  -Feels like the initiation of Duloxetine is helping.  No concerns at this time. She is followed by psych for management of these meds. -Recommended to continue current medication  Fibromyalgia (Goal: Minimize pain) 10/31/21 -Controlled -Current treatment  Duloxetine 60 mg qam Appropriate, Effective, Safe, Accessible -Medications previously tried: none noted -Believes Duloxetine is helping pain  -Recommended to continue current medication -Having increased fibromyalgia pain since cutting back on her Cymbalta.  Discussed potentially increasing but she sees psych regularly and will see her soon regarding increasing this. No changes at this time - continue to follow psych regularly.   Patient Goals/Self-Care Activities Patient will:  - focus on medication adherence by fill dates, pill count target a minimum of 150 minutes of moderate intensity exercise weekly  Follow Up Plan: The care management team will reach out to the patient again over the next 180 days.           The patient verbalized understanding of instructions, educational materials, and care plan provided today and DECLINED offer to receive copy of patient instructions, educational materials, and care plan.  Telephone follow up appointment with pharmacy team member scheduled for: 6 months  Edythe Clarity, [place], PharmD Clinical Pharmacist  [facility] [phone]

## 2021-11-08 DIAGNOSIS — G2581 Restless legs syndrome: Secondary | ICD-10-CM | POA: Diagnosis not present

## 2021-11-08 DIAGNOSIS — F3111 Bipolar disorder, current episode manic without psychotic features, mild: Secondary | ICD-10-CM | POA: Diagnosis not present

## 2021-11-08 DIAGNOSIS — G2401 Drug induced subacute dyskinesia: Secondary | ICD-10-CM | POA: Diagnosis not present

## 2021-11-22 ENCOUNTER — Other Ambulatory Visit: Payer: Self-pay | Admitting: Family Medicine

## 2021-11-24 ENCOUNTER — Other Ambulatory Visit: Payer: Self-pay | Admitting: Family Medicine

## 2021-12-13 ENCOUNTER — Encounter: Payer: Self-pay | Admitting: Family Medicine

## 2021-12-13 ENCOUNTER — Telehealth (INDEPENDENT_AMBULATORY_CARE_PROVIDER_SITE_OTHER): Payer: Medicare PPO | Admitting: Family Medicine

## 2021-12-13 VITALS — Ht 65.0 in | Wt 180.0 lb

## 2021-12-13 DIAGNOSIS — J449 Chronic obstructive pulmonary disease, unspecified: Secondary | ICD-10-CM

## 2021-12-13 DIAGNOSIS — J329 Chronic sinusitis, unspecified: Secondary | ICD-10-CM

## 2021-12-13 DIAGNOSIS — I10 Essential (primary) hypertension: Secondary | ICD-10-CM

## 2021-12-13 DIAGNOSIS — E785 Hyperlipidemia, unspecified: Secondary | ICD-10-CM | POA: Diagnosis not present

## 2021-12-13 MED ORDER — AZELASTINE HCL 0.1 % NA SOLN
2.0000 | Freq: Two times a day (BID) | NASAL | 12 refills | Status: DC
Start: 2021-12-13 — End: 2022-08-22

## 2021-12-13 MED ORDER — DOXYCYCLINE HYCLATE 100 MG PO TABS: 100.0000 mg | ORAL_TABLET | Freq: Two times a day (BID) | ORAL | 0 refills | Status: DC

## 2021-12-13 MED ORDER — DICLOFENAC SODIUM 75 MG PO TBEC: 75.0000 mg | DELAYED_RELEASE_TABLET | Freq: Two times a day (BID) | ORAL | 0 refills | Status: DC

## 2021-12-13 NOTE — Assessment & Plan Note (Signed)
Stable on Symbicort daily.  No signs of COPD exacerbation with current URI.

## 2021-12-13 NOTE — Assessment & Plan Note (Signed)
On atorvastatin 20 mg daily.  Tolerating well.  Check lipids next blood draw.

## 2021-12-13 NOTE — Progress Notes (Signed)
   Danielle Cox is a 61 y.o. female who presents today for a virtual office visit.  Assessment/Plan:  New/Acute Problems: Sinusitis  No red flags though given length of symptoms will need to start antibiotics.  Has a penicillin allergy.  Start doxycycline.  She has tolerated well in the past but also start Astelin.  We will use diclofenac as needed for headache and muscle aches.  Current hydration.  She can use over-the-counter meds as needed as well.  We discussed reasons to return to care.  Follow-up as needed.  Chronic Problems Addressed Today: Dyslipidemia On atorvastatin 20 mg daily.  Tolerating well.  Check lipids next blood draw.  COPD (chronic obstructive pulmonary disease) (HCC) Stable on Symbicort daily.  No signs of COPD exacerbation with current URI.     Subjective:  HPI:  See A/p for status of chronic conditions.  Main concern today is sinus congestion. This has been going on for a couple of months. She has tried several OTC medications without much improvement. No fevers or chills. She has thick green discharge. Symptoms seem to be stable. No shortness of breath. No chest pain.        Objective/Observations  Physical Exam: Gen: NAD, resting comfortably Pulm: Normal work of breathing Neuro: Grossly normal, moves all extremities Psych: Normal affect and thought content  Virtual Visit via Video   I connected with [name] on 12/13/21 at  7:40 AM EST by a video enabled telemedicine application and verified that I am speaking with the correct person using two identifiers. The limitations of evaluation and management by telemedicine and the availability of in person appointments were discussed. The patient expressed understanding and agreed to proceed.   Patient location: Home Provider location: [place] [facility] [employer] Persons participating in the virtual visit: Myself and Patient     Katina Degree. [name], MD 12/13/2021 8:09 AM

## 2021-12-19 ENCOUNTER — Telehealth: Payer: Self-pay | Admitting: *Deleted

## 2021-12-19 DIAGNOSIS — I10 Essential (primary) hypertension: Secondary | ICD-10-CM

## 2021-12-19 DIAGNOSIS — J449 Chronic obstructive pulmonary disease, unspecified: Secondary | ICD-10-CM

## 2021-12-19 NOTE — Telephone Encounter (Signed)
-----   Message from [name], MD sent at 12/18/2021  3:15 PM EST ----- Regarding: FW: order request  ----- Message ----- From: Audrie Gallus, RN Sent: 11/24/2021  12:26 PM EST To: [name], MD Subject: order request                                  Pt is involved with pharmacist, can you please send REF [id number] order for CCM with 2 qualifying diagnoses.  Thank you

## 2021-12-28 ENCOUNTER — Telehealth: Payer: Self-pay

## 2021-12-28 NOTE — Progress Notes (Signed)
  Chronic Care Management   Note  12/28/2021 Name: Danielle Cox MRN: [id number] DOB: 09/20/1960  Danielle Cox is a 61 y.o. year old female who is a primary care patient of [name], MD. I reached out to [name] by phone today in response to a referral sent by [name] PCP.  [name]  agreedto scheduling an appointment with the CCM RN Case Manager   Follow up plan: Patient agreed to scheduled appointment with RN Case Manager on 01/29/2022(date/time).   Penne Lash, RMA Care Guide [facility]  [place], [place] [address] Direct Dial: [phone] [email or web address]

## 2022-01-29 ENCOUNTER — Ambulatory Visit (INDEPENDENT_AMBULATORY_CARE_PROVIDER_SITE_OTHER): Payer: Medicare PPO | Admitting: *Deleted

## 2022-01-29 DIAGNOSIS — J449 Chronic obstructive pulmonary disease, unspecified: Secondary | ICD-10-CM

## 2022-01-29 DIAGNOSIS — I10 Essential (primary) hypertension: Secondary | ICD-10-CM

## 2022-01-29 NOTE — Chronic Care Management (AMB) (Signed)
Chronic Care Management   CCM RN Visit Note  01/29/2022 Name: Danielle Cox MRN: [id number] DOB: 08-18-1960  Subjective: Danielle Cox is a 62 y.o. year old female who is a primary care patient of Vivi Barrack, MD. The patient was referred to the Chronic Care Management team for assistance with care management needs subsequent to provider initiation of CCM services and plan of care.    Today's Visit:  Engaged with patient by telephone for initial visit.     SDOH Interventions Today    Flowsheet Row Most Recent Value  SDOH Interventions   Food Insecurity Interventions Intervention Not Indicated  Housing Interventions Intervention Not Indicated  Transportation Interventions Intervention Not Indicated  Utilities Interventions Intervention Not Indicated  Financial Strain Interventions Intervention Not Indicated  Physical Activity Interventions Intervention Not Indicated  [pt does stretching, is limited with ability to exercise due to fibromyalgia]  Stress Interventions Intervention Not Indicated  Social Connections Interventions Intervention Not Indicated         Goals Addressed             This Visit's Progress    CCM (COPD) EXPECTED OUTCOME:  MONITOR, SELF-MANAGE AND REDUCE SYMPTOMS OF COPD       Current Barriers:  Knowledge Deficits related to COPD management Chronic Disease Management support and education needs related to COPD, action plan Patient reports she is trying to be careful with winter time viruses in the air, verbalizes understanding of calling doctor early on for any respiratory issues  Planned Interventions: Provided patient with basic written and verbal COPD education on self care/management/and exacerbation prevention Provided written and verbal instructions on pursed lip breathing and utilized returned demonstration as teach back Provided instruction about proper use of medications used for management of COPD including inhalers Advised patient to  self assesses COPD action plan zone and make appointment with provider if in the yellow zone for 48 hours without improvement Advised patient to engage in light exercise as tolerated 3-5 days a week to aid in the the management of COPD Provided education about and advised patient to utilize infection prevention strategies to reduce risk of respiratory infection Screening for signs and symptoms of depression related to chronic disease state  Assessed social determinant of health barriers  Symptom Management: Take medications as prescribed   Attend all scheduled provider appointments Call pharmacy for medication refills 3-7 days in advance of running out of medications Attend church or other social activities Perform all self care activities independently  Perform IADL's (shopping, preparing meals, housekeeping, managing finances) independently Call provider office for new concerns or questions  limit outdoor activity during cold weather listen for public air quality announcements every day do breathing exercises every day develop a rescue plan eliminate symptom triggers at home follow rescue plan if symptoms flare-up eat healthy/prescribed diet: heart healthy, low sodium get at least 7 to 8 hours of sleep at night practice relaxation or meditation daily Look over education sent via my chart- COPD action plan  Follow Up Plan: Telephone follow up appointment with care management team member scheduled for:  03/28/22 at 9 am        CCM (HYPERTENSION) EXPECTED OUTCOME: MONITOR, SELF-MANAGE AND REDUCE SYMPTOMS OF HYPERTENSION       Current Barriers:  Knowledge Deficits related to Hypertension management Chronic Disease Management support and education needs related to Hypertension, diet Patient reports she lives with spouse, is independent in all aspects of her care, continues to drive, has fibromyalgia which affects  her ability to exercise, does stretching exercises in the morning. Patient  reports she checks blood pressure on occasion with readings "always good"  Planned Interventions: Evaluation of current treatment plan related to hypertension self management and patient's adherence to plan as established by provider;   Reviewed prescribed diet low sodium Reviewed medications with patient and discussed importance of compliance;  Counseled on the importance of exercise goals with target of 150 minutes per week Discussed plans with patient for ongoing care management follow up and provided patient with direct contact information for care management team; Advised patient, providing education and rationale, to monitor blood pressure daily and record, calling PCP for findings outside established parameters;  Provided education on prescribed diet low sodium;  Discussed complications of poorly controlled blood pressure such as heart disease, stroke, circulatory complications, vision complications, kidney impairment, sexual dysfunction;  Screening for signs and symptoms of depression related to chronic disease state;  Assessed social determinant of health barriers;   Symptom Management: Take medications as prescribed   Attend all scheduled provider appointments Call pharmacy for medication refills 3-7 days in advance of running out of medications Attend church or other social activities Perform all self care activities independently  Perform IADL's (shopping, preparing meals, housekeeping, managing finances) independently Call provider office for new concerns or questions  check blood pressure weekly write blood pressure results in a log or diary learn about high blood pressure keep a blood pressure log take blood pressure log to all doctor appointments keep all doctor appointments take medications for blood pressure exactly as prescribed begin an exercise program report new symptoms to your doctor eat more whole grains, fruits and vegetables, lean meats and healthy fats Look  over education sent via my chart- low sodium diet  Follow Up Plan: Telephone follow up appointment with care management team member scheduled for:  03/28/22 at 9 am          Plan:Telephone follow up appointment with care management team member scheduled for:  03/28/22 at 9 am  Jacqlyn Larsen [facility], BSN RN Case Manager [place] at [employer] [phone]

## 2022-01-29 NOTE — Plan of Care (Signed)
Chronic Care Management Provider Comprehensive Care Plan    01/29/2022 Name: Danielle Cox MRN: [id number] DOB: [birth year]  Referral to Chronic Care Management (CCM) services was placed by Provider:  Dimas Chyle MD on Date: 12/19/21.  Chronic Condition 1: HYPERTENSION Provider Assessment and Plan Essential hypertension At goal off meds.  Check labs.   Expected Outcome/Goals Addressed This Visit (Provider CCM goals/Provider Assessment and plan  CCM (HYPERTENSION) EXPECTED OUTCOME: MONITOR, SELF-MANAGE AND REDUCE SYMPTOMS OF HYPERTENSION  Symptom Management Condition 1: Take medications as prescribed   Attend all scheduled provider appointments Call pharmacy for medication refills 3-7 days in advance of running out of medications Attend church or other social activities Perform all self care activities independently  Perform IADL's (shopping, preparing meals, housekeeping, managing finances) independently Call provider office for new concerns or questions  check blood pressure weekly write blood pressure results in a log or diary learn about high blood pressure keep a blood pressure log take blood pressure log to all doctor appointments keep all doctor appointments take medications for blood pressure exactly as prescribed begin an exercise program report new symptoms to your doctor eat more whole grains, fruits and vegetables, lean meats and healthy fats Look over education sent via my chart- low sodium diet  Chronic Condition 2: COPD Provider Assessment and Plan COPD (chronic obstructive pulmonary disease) ([place]) Stable on Symbicort daily.  No signs of COPD exacerbation with current URI.   Expected Outcome/Goals Addressed This Visit (Provider CCM goals/Provider Assessment and plan  CCM (COPD) EXPECTED OUTCOME:  MONITOR, SELF-MANAGE AND REDUCE SYMPTOMS OF COPD  Symptom Management Condition 2: Take medications as prescribed   Attend all scheduled provider appointments Call  pharmacy for medication refills 3-7 days in advance of running out of medications Attend church or other social activities Perform all self care activities independently  Perform IADL's (shopping, preparing meals, housekeeping, managing finances) independently Call provider office for new concerns or questions  limit outdoor activity during cold weather listen for public air quality announcements every day do breathing exercises every day develop a rescue plan eliminate symptom triggers at home follow rescue plan if symptoms flare-up eat healthy/prescribed diet: heart healthy, low sodium get at least 7 to 8 hours of sleep at night practice relaxation or meditation daily Look over education sent via my chart- COPD action plan  Problem List Patient Active Problem List   Diagnosis Date Noted   Dyslipidemia 04/20/2021   Gastrointestinal hemorrhage 04/05/2020   Bipolar affective disorder ([place]) 05/17/2019   Seasonal allergic rhinitis due to pollen 08/24/2018   Arthralgia of multiple joints 08/24/2018   Insulin resistance 08/24/2018   Cervical radiculopathy 12/29/2017   Lumbar back pain 12/29/2017   Fibromyalgia 11/21/2017   COPD (chronic obstructive pulmonary disease) ([employer]) 11/21/2017   Personality disorder ([place]) 04/23/2013   Nicotine dependence with current use 02/17/2009   Essential hypertension 02/17/2009   Cough 02/17/2009    Medication Management  Current Outpatient Medications:    acetaminophen (TYLENOL) 500 MG tablet, Take 2 tablets (1,000 mg total) by mouth every 6 (six) hours as needed for mild pain or headache., Disp: 30 tablet, Rfl: 0   ARIPiprazole (ABILIFY) 10 MG tablet, Take 10 mg by mouth in the morning and at bedtime., Disp: , Rfl:    atorvastatin (LIPITOR) 20 MG tablet, TAKE 1 TABLET BY MOUTH DAILY, Disp: 90 tablet, Rfl: 1   azelastine (ASTELIN) 0.1 % nasal spray, Place 2 sprays into both nostrils 2 (two) times daily., Disp: 30 mL, Rfl:  12   Deutetrabenazine ER  (AUSTEDO XR) 24 MG TB24, Take 24 mg by mouth daily., Disp: , Rfl:    dicyclomine (BENTYL) 20 MG tablet, Take 20 mg by mouth in the morning and at bedtime., Disp: , Rfl:    DULoxetine (CYMBALTA) 60 MG capsule, Take 60 mg by mouth daily. 120 mg in the am, Disp: , Rfl:    hydrOXYzine (ATARAX/VISTARIL) 25 MG tablet, Take 25 mg by mouth 3 (three) times daily as needed., Disp: , Rfl:    modafinil (PROVIGIL) 200 MG tablet, Take 1 tablet (200 mg total) by mouth in the morning and at bedtime. (Patient taking differently: Take 200 mg by mouth in the morning.), Disp: 14 tablet, Rfl: 0   omeprazole (PRILOSEC) 20 MG capsule, Take 20 mg by mouth daily., Disp: , Rfl:    rOPINIRole (REQUIP) 4 MG tablet, Take by mouth in the morning and at bedtime., Disp: , Rfl:    SYMBICORT 80-4.5 MCG/ACT inhaler, INHALE 2 PUFFS BY MOUTH TWICE A DAY, Disp: 10.2 g, Rfl: 0   TEMAZEPAM PO, Take by mouth., Disp: , Rfl:    traZODone (DESYREL) 150 MG tablet, Take by mouth at bedtime., Disp: , Rfl:    diazepam (VALIUM) 2 MG tablet, Take 2 mg by mouth every 12 (twelve) hours as needed for anxiety. (Patient not taking: Reported on 01/29/2022), Disp: , Rfl:    diclofenac (VOLTAREN) 75 MG EC tablet, Take 1 tablet (75 mg total) by mouth 2 (two) times daily. (Patient not taking: Reported on 01/29/2022), Disp: 30 tablet, Rfl: 0   doxycycline (VIBRA-TABS) 100 MG tablet, Take 1 tablet (100 mg total) by mouth 2 (two) times daily. (Patient not taking: Reported on 01/29/2022), Disp: 20 tablet, Rfl: 0   PARoxetine (PAXIL) 20 MG tablet, Take 20 mg by mouth daily. (Patient not taking: Reported on 01/29/2022), Disp: , Rfl:   Cognitive Assessment Identity Confirmed: : Name; DOB Cognitive Status: Normal   Functional Assessment Hearing Difficulty or Deaf: no Wear Glasses or Blind: yes Vision Management: can see well w/ glasses Concentrating, Remembering or Making Decisions Difficulty (CP): no Difficulty Communicating: no Difficulty Eating/Swallowing:  no Walking or Climbing Stairs Difficulty: no Dressing/Bathing Difficulty: no Doing Errands Independently Difficulty (such as shopping) (CP): no   Caregiver Assessment  Primary Source of Support/Comfort: spouse Name of Support/Comfort Primary Source: spouse Aftyn Nott People in Home: spouse Name(s) of People in Home: Kalanie Fewell   Planned Interventions  Evaluation of current treatment plan related to hypertension self management and patient's adherence to plan as established by provider;   Reviewed prescribed diet low sodium Reviewed medications with patient and discussed importance of compliance;  Counseled on the importance of exercise goals with target of 150 minutes per week Discussed plans with patient for ongoing care management follow up and provided patient with direct contact information for care management team; Advised patient, providing education and rationale, to monitor blood pressure daily and record, calling PCP for findings outside established parameters;  Provided education on prescribed diet low sodium;  Discussed complications of poorly controlled blood pressure such as heart disease, stroke, circulatory complications, vision complications, kidney impairment, sexual dysfunction;  Screening for signs and symptoms of depression related to chronic disease state;  Assessed social determinant of health barriers;  Provided patient with basic written and verbal COPD education on self care/management/and exacerbation prevention Provided written and verbal instructions on pursed lip breathing and utilized returned demonstration as teach back Provided instruction about proper use of medications used for  management of COPD including inhalers Advised patient to self assesses COPD action plan zone and make appointment with provider if in the yellow zone for 48 hours without improvement Advised patient to engage in light exercise as tolerated 3-5 days a week to aid in the the  management of COPD Provided education about and advised patient to utilize infection prevention strategies to reduce risk of respiratory infection Screening for signs and symptoms of depression related to chronic disease state  Assessed social determinant of health barriers  Interaction and coordination with outside resources, practitioners, and providers See CCM Referral  Care Plan: Available in MyChart

## 2022-01-29 NOTE — Patient Instructions (Signed)
Please call the care guide team at [phone] if you need to cancel or reschedule your appointment.   If you are experiencing a Mental Health or [place] or need someone to talk to, please call the Suicide and Crisis Lifeline: 988 call the [place] National Suicide Prevention Lifeline: 912-519-1263 or TTY: 561-879-3836 TTY 7017815999) to talk to a trained counselor call 1-800-273-TALK (toll free, 24 hour hotline) go to [facility] [address], [place] [phone]) call 911   Following is a copy of the CCM Program Consent:  CCM service includes personalized support from designated clinical staff supervised by the physician, including individualized plan of care and coordination with other care providers 24/7 contact phone numbers for assistance for urgent and routine care needs. Service will only be billed when office clinical staff spend 20 minutes or more in a month to coordinate care. Only one practitioner may furnish and bill the service in a calendar month. The patient may stop CCM services at amy time (effective at the end of the month) by phone call to the office staff. The patient will be responsible for cost sharing (co-pay) or up to 20% of the service fee (after annual deductible is met)  Following is a copy of your full provider care plan:   Goals Addressed             This Visit's Progress    CCM (COPD) EXPECTED OUTCOME:  MONITOR, SELF-MANAGE AND REDUCE SYMPTOMS OF COPD       Current Barriers:  Knowledge Deficits related to COPD management Chronic Disease Management support and education needs related to COPD, action plan Patient reports she is trying to be careful with winter time viruses in the air, verbalizes understanding of calling doctor early on for any respiratory issues  Planned Interventions: Provided patient with basic written and verbal COPD education on self care/management/and exacerbation  prevention Provided written and verbal instructions on pursed lip breathing and utilized returned demonstration as teach back Provided instruction about proper use of medications used for management of COPD including inhalers Advised patient to self assesses COPD action plan zone and make appointment with provider if in the yellow zone for 48 hours without improvement Advised patient to engage in light exercise as tolerated 3-5 days a week to aid in the the management of COPD Provided education about and advised patient to utilize infection prevention strategies to reduce risk of respiratory infection Screening for signs and symptoms of depression related to chronic disease state  Assessed social determinant of health barriers  Symptom Management: Take medications as prescribed   Attend all scheduled provider appointments Call pharmacy for medication refills 3-7 days in advance of running out of medications Attend church or other social activities Perform all self care activities independently  Perform IADL's (shopping, preparing meals, housekeeping, managing finances) independently Call provider office for new concerns or questions  limit outdoor activity during cold weather listen for public air quality announcements every day do breathing exercises every day develop a rescue plan eliminate symptom triggers at home follow rescue plan if symptoms flare-up eat healthy/prescribed diet: heart healthy, low sodium get at least 7 to 8 hours of sleep at night practice relaxation or meditation daily Look over education sent via my chart- COPD action plan  Follow Up Plan: Telephone follow up appointment with care management team member scheduled for:  03/28/22 at 9 am        CCM (HYPERTENSION) EXPECTED OUTCOME: MONITOR, SELF-MANAGE AND REDUCE SYMPTOMS  OF HYPERTENSION       Current Barriers:  Knowledge Deficits related to Hypertension management Chronic Disease Management support and  education needs related to Hypertension, diet Patient reports she lives with spouse, is independent in all aspects of her care, continues to drive, has fibromyalgia which affects her ability to exercise, does stretching exercises in the morning. Patient reports she checks blood pressure on occasion with readings "always good"  Planned Interventions: Evaluation of current treatment plan related to hypertension self management and patient's adherence to plan as established by provider;   Reviewed prescribed diet low sodium Reviewed medications with patient and discussed importance of compliance;  Counseled on the importance of exercise goals with target of 150 minutes per week Discussed plans with patient for ongoing care management follow up and provided patient with direct contact information for care management team; Advised patient, providing education and rationale, to monitor blood pressure daily and record, calling PCP for findings outside established parameters;  Provided education on prescribed diet low sodium;  Discussed complications of poorly controlled blood pressure such as heart disease, stroke, circulatory complications, vision complications, kidney impairment, sexual dysfunction;  Screening for signs and symptoms of depression related to chronic disease state;  Assessed social determinant of health barriers;   Symptom Management: Take medications as prescribed   Attend all scheduled provider appointments Call pharmacy for medication refills 3-7 days in advance of running out of medications Attend church or other social activities Perform all self care activities independently  Perform IADL's (shopping, preparing meals, housekeeping, managing finances) independently Call provider office for new concerns or questions  check blood pressure weekly write blood pressure results in a log or diary learn about high blood pressure keep a blood pressure log take blood pressure log to  all doctor appointments keep all doctor appointments take medications for blood pressure exactly as prescribed begin an exercise program report new symptoms to your doctor eat more whole grains, fruits and vegetables, lean meats and healthy fats Look over education sent via my chart- low sodium diet  Follow Up Plan: Telephone follow up appointment with care management team member scheduled for:  03/28/22 at 9 am          Patient verbalizes understanding of instructions and care plan provided today and agrees to view in MyChart. Active MyChart status and patient understanding of how to access instructions and care plan via MyChart confirmed with patient.     Telephone follow up appointment with care management team member scheduled for:  03/28/22 at 9 am  Low-Sodium Eating Plan Sodium, which is an element that makes up salt, helps you maintain a healthy balance of fluids in your body. Too much sodium can increase your blood pressure and cause fluid and waste to be held in your body. Your health care provider or dietitian may recommend following this plan if you have high blood pressure (hypertension), kidney disease, liver disease, or heart failure. Eating less sodium can help lower your blood pressure, reduce swelling, and protect your heart, liver, and kidneys. What are tips for following this plan? Reading food labels The Nutrition Facts label lists the amount of sodium in one serving of the food. If you eat more than one serving, you must multiply the listed amount of sodium by the number of servings. Choose foods with less than 140 mg of sodium per serving. Avoid foods with 300 mg of sodium or more per serving. Shopping  Look for lower-sodium products, often labeled as "low-sodium" or "no  salt added." Always check the sodium content, even if foods are labeled as "unsalted" or "no salt added." Buy fresh foods. Avoid canned foods and pre-made or frozen meals. Avoid canned, cured, or  processed meats. Buy breads that have less than 80 mg of sodium per slice. Cooking  Eat more home-cooked food and less restaurant, buffet, and fast food. Avoid adding salt when cooking. Use salt-free seasonings or herbs instead of table salt or sea salt. Check with your health care provider or pharmacist before using salt substitutes. Cook with plant-based oils, such as canola, sunflower, or olive oil. Meal planning When eating at a restaurant, ask that your food be prepared with less salt or no salt, if possible. Avoid dishes labeled as brined, pickled, cured, smoked, or made with soy sauce, miso, or teriyaki sauce. Avoid foods that contain MSG (monosodium glutamate). MSG is sometimes added to [place] food, bouillon, and some canned foods. Make meals that can be grilled, baked, poached, roasted, or steamed. These are generally made with less sodium. General information Most people on this plan should limit their sodium intake to 1,500-2,000 mg (milligrams) of sodium each day. What foods should I eat? Fruits Fresh, frozen, or canned fruit. Fruit juice. Vegetables Fresh or frozen vegetables. "No salt added" canned vegetables. "No salt added" tomato sauce and paste. Low-sodium or reduced-sodium tomato and vegetable juice. Grains Low-sodium cereals, including oats, puffed wheat and rice, and shredded wheat. Low-sodium crackers. Unsalted rice. Unsalted pasta. Low-sodium bread. Whole-grain breads and whole-grain pasta. Meats and other proteins Fresh or frozen (no salt added) meat, poultry, seafood, and fish. Low-sodium canned tuna and salmon. Unsalted nuts. Dried peas, beans, and lentils without added salt. Unsalted canned beans. Eggs. Unsalted nut butters. Dairy Milk. Soy milk. Cheese that is naturally low in sodium, such as ricotta cheese, fresh mozzarella, or Swiss cheese. Low-sodium or reduced-sodium cheese. Cream cheese. Yogurt. Seasonings and condiments Fresh and dried herbs and spices.  Salt-free seasonings. Low-sodium mustard and ketchup. Sodium-free salad dressing. Sodium-free light mayonnaise. Fresh or refrigerated horseradish. Lemon juice. Vinegar. Other foods Homemade, reduced-sodium, or low-sodium soups. Unsalted popcorn and pretzels. Low-salt or salt-free chips. The items listed above may not be a complete list of foods and beverages you can eat. Contact a dietitian for more information. What foods should I avoid? Vegetables Sauerkraut, pickled vegetables, and relishes. Olives. [place] fries. Onion rings. Regular canned vegetables (not low-sodium or reduced-sodium). Regular canned tomato sauce and paste (not low-sodium or reduced-sodium). Regular tomato and vegetable juice (not low-sodium or reduced-sodium). Frozen vegetables in sauces. Grains Instant hot cereals. Bread stuffing, pancake, and biscuit mixes. Croutons. Seasoned rice or pasta mixes. Noodle soup cups. Boxed or frozen macaroni and cheese. Regular salted crackers. Self-rising flour. Meats and other proteins Meat or fish that is salted, canned, smoked, spiced, or pickled. Precooked or cured meat, such as sausages or meat loaves. Berniece Salines. Ham. Pepperoni. Hot dogs. Corned beef. Chipped beef. Salt pork. Jerky. Pickled herring. Anchovies and sardines. Regular canned tuna. Salted nuts. Dairy Processed cheese and cheese spreads. Hard cheeses. Cheese curds. Blue cheese. Feta cheese. String cheese. Regular cottage cheese. Buttermilk. Canned milk. Fats and oils Salted butter. Regular margarine. Ghee. Bacon fat. Seasonings and condiments Onion salt, garlic salt, seasoned salt, table salt, and sea salt. Canned and packaged gravies. Worcestershire sauce. Tartar sauce. Barbecue sauce. Teriyaki sauce. Soy sauce, including reduced-sodium. Steak sauce. Fish sauce. Oyster sauce. Cocktail sauce. Horseradish that you find on the shelf. Regular ketchup and mustard. Meat flavorings and tenderizers. Bouillon cubes. Hot  sauce. Pre-made or  packaged marinades. Pre-made or packaged taco seasonings. Relishes. Regular salad dressings. Salsa. Other foods Salted popcorn and pretzels. Corn chips and puffs. Potato and tortilla chips. Canned or dried soups. Pizza. Frozen entrees and pot pies. The items listed above may not be a complete list of foods and beverages you should avoid. Contact a dietitian for more information. Summary Eating less sodium can help lower your blood pressure, reduce swelling, and protect your heart, liver, and kidneys. Most people on this plan should limit their sodium intake to 1,500-2,000 mg (milligrams) of sodium each day. Canned, boxed, and frozen foods are high in sodium. Restaurant foods, fast foods, and pizza are also very high in sodium. You also get sodium by adding salt to food. Try to cook at home, eat more fresh fruits and vegetables, and eat less fast food and canned, processed, or prepared foods. This information is not intended to replace advice given to you by your health care provider. Make sure you discuss any questions you have with your health care provider. Document Revised: 02/06/2019 Document Reviewed: 12/03/2018 Elsevier Patient Education  2023 Elsevier Inc. COPD Action Plan A COPD action plan is a description of what to do when you have a flare (exacerbation) of chronic obstructive pulmonary disease (COPD). Your action plan is a color-coded plan that lists the symptoms that indicate whether your condition is under control and what actions to take. If you have symptoms in the green zone, it means you are doing well that day. If you have symptoms in the yellow zone, it means you are having a bad day or an exacerbation. If you have symptoms in the red zone, you need urgent medical care. Follow the plan that you and your health care provider developed. Review your plan with your health care provider at each visit. Red zone Symptoms in this zone mean that you should get medical help right away.  They include: Feeling very short of breath, even when you are resting. Not being able to do any activities because of poor breathing. Not being able to sleep because of poor breathing. Fever or shaking chills. Feeling confused or very sleepy. Chest pain. Coughing up blood. If you have any of these symptoms, call emergency services (911 in the [place] or go to the nearest emergency room. Yellow zone Symptoms in this zone mean that your condition may be getting worse. They include: Feeling more short of breath than usual. Having less energy for daily activities than usual. Phlegm or mucus that is thicker than usual. Needing to use your rescue inhaler or nebulizer more often than usual. More ankle swelling than usual. Coughing more than usual. Feeling like you have a chest cold. Trouble sleeping due to COPD symptoms. Decreased appetite. COPD medicines not helping as much as usual. If you experience any "yellow" symptoms: Keep taking your daily medicines as directed. Use your quick-relief inhaler as told by your health care provider. If you were prescribed steroid medicine to take by mouth (oral medicine), start taking it as told by your health care provider. If you were prescribed an antibiotic medicine, start taking it as told by your health care provider. Do not stop taking the antibiotic even if you start to feel better. Use oxygen as told by your health care provider. Get more rest. Do your pursed-lip breathing exercises. Do not smoke. Avoid any irritants in the air. If your signs and symptoms do not improve after taking these steps, call your health care provider right  away. Green zone Symptoms in this zone mean that you are doing well. They include: Being able to do your usual activities and exercise. Having the usual amount of coughing, including the same amount of phlegm or mucus. Being able to sleep well. Having a good appetite. Where to find more information: You can find  more information about COPD from: American Lung Association, My COPD Action Plan: www.lung.org COPD Foundation: www.copdfoundation.org National Heart, Lung, & Blood Institute: [employer]is Follow these instructions at home: Continue taking your daily medicines as told by your health care provider. Make sure you receive all the immunizations that your health care provider recommends, especially the pneumococcal and influenza vaccines. Wash your hands often with soap and water. Have family members wash their hands too. Regular hand washing can help prevent infections. Follow your usual exercise and diet plan. Avoid irritants in the air, such as smoke. Do not use any products that contain nicotine or tobacco. These products include cigarettes, chewing tobacco, and vaping devices, such as e-cigarettes. If you need help quitting, ask your health care provider. Summary A COPD action plan tells you what to do when you have a flare (exacerbation) of chronic obstructive pulmonary disease (COPD). Follow each action plan for your symptoms. If you have any symptoms in the red zone, call emergency services (911 in the [place] or go to the nearest emergency room. This information is not intended to replace advice given to you by your health care provider. Make sure you discuss any questions you have with your health care provider. Document Revised: 11/10/2019 Document Reviewed: 11/10/2019 Elsevier Patient Education  2023 [employer].

## 2022-01-30 ENCOUNTER — Other Ambulatory Visit: Payer: Self-pay | Admitting: Family Medicine

## 2022-02-12 DIAGNOSIS — H2513 Age-related nuclear cataract, bilateral: Secondary | ICD-10-CM | POA: Diagnosis not present

## 2022-02-14 DIAGNOSIS — J449 Chronic obstructive pulmonary disease, unspecified: Secondary | ICD-10-CM

## 2022-02-14 DIAGNOSIS — F17210 Nicotine dependence, cigarettes, uncomplicated: Secondary | ICD-10-CM

## 2022-02-14 DIAGNOSIS — I10 Essential (primary) hypertension: Secondary | ICD-10-CM | POA: Diagnosis not present

## 2022-02-22 DIAGNOSIS — G2581 Restless legs syndrome: Secondary | ICD-10-CM | POA: Diagnosis not present

## 2022-02-22 DIAGNOSIS — F3175 Bipolar disorder, in partial remission, most recent episode depressed: Secondary | ICD-10-CM | POA: Diagnosis not present

## 2022-02-22 DIAGNOSIS — G2401 Drug induced subacute dyskinesia: Secondary | ICD-10-CM | POA: Diagnosis not present

## 2022-02-22 DIAGNOSIS — Z79899 Other long term (current) drug therapy: Secondary | ICD-10-CM | POA: Diagnosis not present

## 2022-02-22 DIAGNOSIS — F419 Anxiety disorder, unspecified: Secondary | ICD-10-CM | POA: Diagnosis not present

## 2022-03-13 ENCOUNTER — Telehealth: Payer: Self-pay | Admitting: Pharmacist

## 2022-03-13 NOTE — Progress Notes (Signed)
Care Management & Coordination Services Pharmacy Team  Reason for Encounter: General adherence update   Contacted patient for general health update and medication adherence call.  {US HC Outreach:28874}   What concerns do you have about your medications?  The patient {denies/reports:25180} side effects with their medications.   How often do you forget or accidentally miss a dose? {missed doses:25554}  Do you use a pillbox? {yes/no:20286}  Are you having any problems getting your medications from your pharmacy? {yes/no:20286}  Has the cost of your medications been a concern? {yes/no:20286} If yes, what medication and is patient assistance available or has it been applied for?  Since last visit with PharmD, {no/thefollowing:25210} interventions have been made.   The patient {has/has not:25209} had an ED visit since last contact.   The patient {denies/reports:25180} problems with their health.   Patient {denies/reports:25180} concerns or questions for ***, PharmD at this time.   Counseled patient on: {GENERALCOUNSELING:28686}   Star Rating Drugs:  Atorvastatin 20 mg last filled 11/24/2021 90 DS   Chart Updates: Recent office visits:  12/13/2021 [facility] (PCP) Vivi Barrack, MD; Start doxycycline. She has tolerated well in the past but also start Astelin. We will use diclofenac as needed for headache and muscle aches.   Recent consult visits:  None since last PharmD visit  Hospital visits:  None in previous 6 months  Medications: Outpatient Encounter Medications as of 03/13/2022  Medication Sig   acetaminophen (TYLENOL) 500 MG tablet Take 2 tablets (1,000 mg total) by mouth every 6 (six) hours as needed for mild pain or headache.   ARIPiprazole (ABILIFY) 10 MG tablet Take 10 mg by mouth in the morning and at bedtime.   atorvastatin (LIPITOR) 20 MG tablet TAKE 1 TABLET BY MOUTH DAILY   azelastine (ASTELIN) 0.1 % nasal spray Place 2 sprays into both nostrils 2 (two) times  daily.   Deutetrabenazine ER (AUSTEDO XR) 24 MG TB24 Take 24 mg by mouth daily.   diazepam (VALIUM) 2 MG tablet Take 2 mg by mouth every 12 (twelve) hours as needed for anxiety. (Patient not taking: Reported on 01/29/2022)   diclofenac (VOLTAREN) 75 MG EC tablet Take 1 tablet (75 mg total) by mouth 2 (two) times daily. (Patient not taking: Reported on 01/29/2022)   dicyclomine (BENTYL) 20 MG tablet Take 20 mg by mouth in the morning and at bedtime.   doxycycline (VIBRA-TABS) 100 MG tablet Take 1 tablet (100 mg total) by mouth 2 (two) times daily. (Patient not taking: Reported on 01/29/2022)   DULoxetine (CYMBALTA) 60 MG capsule Take 60 mg by mouth daily. 120 mg in the am   hydrOXYzine (ATARAX/VISTARIL) 25 MG tablet Take 25 mg by mouth 3 (three) times daily as needed.   modafinil (PROVIGIL) 200 MG tablet Take 1 tablet (200 mg total) by mouth in the morning and at bedtime. (Patient taking differently: Take 200 mg by mouth in the morning.)   omeprazole (PRILOSEC) 20 MG capsule Take 20 mg by mouth daily.   PARoxetine (PAXIL) 20 MG tablet Take 20 mg by mouth daily. (Patient not taking: Reported on 01/29/2022)   rOPINIRole (REQUIP) 4 MG tablet Take by mouth in the morning and at bedtime.   SYMBICORT 80-4.5 MCG/ACT inhaler INHALE 2 PUFFS BY MOUTH TWICE A DAY   TEMAZEPAM PO Take by mouth.   traZODone (DESYREL) 150 MG tablet Take by mouth at bedtime.   No facility-administered encounter medications on file as of 03/13/2022.    Recent Office Vitals: BP Readings from  Last 3 Encounters:  04/18/21 123/81  11/08/20 140/90  10/03/20 138/76   Pulse Readings from Last 3 Encounters:  04/18/21 (!) 105  11/08/20 96  10/03/20 (!) 103    Wt Readings from Last 3 Encounters:  12/13/21 180 lb (81.6 kg)  04/18/21 179 lb 6.4 oz (81.4 kg)  11/08/20 185 lb (83.9 kg)     Kidney Function Lab Results  Component Value Date/Time   CREATININE 0.79 04/18/2021 12:17 PM   CREATININE 0.69 04/05/2020 11:51 AM   GFR  82.20 04/18/2021 12:17 PM   GFRNONAA >60 05/15/2019 12:46 PM   GFRAA >60 05/15/2019 12:46 PM       Latest Ref Rng & Units 04/18/2021   12:17 PM 04/05/2020   11:51 AM 05/15/2019   12:46 PM  BMP  Glucose 70 - 99 mg/dL 111  124  115   BUN 6 - 23 mg/dL '17  13  18   '$ Creatinine 0.40 - 1.20 mg/dL 0.79  0.69  0.68   Sodium 135 - 145 mEq/L 143  143  144   Potassium 3.5 - 5.1 mEq/L 4.4  3.8  3.6   Chloride 96 - 112 mEq/L 106  107  106   CO2 19 - 32 mEq/L '29  28  31   '$ Calcium 8.4 - 10.5 mg/dL 9.8  9.5  9.4      Future Appointments  Date Time Provider [place]  03/28/2022  9:00 AM [facility] C[facility]  05/02/2022  9:00 AM Melvenia Beam, MD [facility] None  08/30/2022  1:45 PM [facility] HEALTH COACH [facility]   April D Calhoun, [place] Pharmacist Assistant [phone]

## 2022-03-27 ENCOUNTER — Ambulatory Visit: Payer: Medicare PPO | Admitting: Family Medicine

## 2022-03-27 VITALS — BP 115/71 | HR 101 | Temp 97.3°F | Ht 65.0 in | Wt 176.4 lb

## 2022-03-27 DIAGNOSIS — G2401 Drug induced subacute dyskinesia: Secondary | ICD-10-CM | POA: Diagnosis not present

## 2022-03-27 DIAGNOSIS — F319 Bipolar disorder, unspecified: Secondary | ICD-10-CM | POA: Diagnosis not present

## 2022-03-27 DIAGNOSIS — I10 Essential (primary) hypertension: Secondary | ICD-10-CM

## 2022-03-27 NOTE — Assessment & Plan Note (Signed)
Blood pressure at today off meds.

## 2022-03-27 NOTE — Assessment & Plan Note (Addendum)
Discussed with patient that her symptoms are consistent with tardive dyskinesia likely secondary to her longstanding Abilify prescription.  She has recently cut back on the dose of Abilify from 20 mg to 10 mg.  Psychiatry has been managing this and she is already been on Ingrezza, austedo, and Sinemet without much improvement in her symptoms.  She has no other red flag signs or symptoms today.  No signs of infection.  No abnormalities on neuroexam.   Advised her to cut her dose of Abilify to 5 mg daily and follow-up with psychiatry ASAP for ongoing management.  She is aware reasons to return to care or seek emergent care.  She would like to be referred to different psychiatrist.  Will place referral today advised her to reach out to her current psychiatrist ASAP as above.

## 2022-03-27 NOTE — Assessment & Plan Note (Signed)
She follows with psychiatry.  She is currently on Abilify 10 mg daily, Cymbalta 120 mg daily, trazodone 150 mg daily.  Discussed with patient that the Abilify is likely causing her symptoms and advised her to follow-up with psychiatry soon as above.  We will decrease her Abilify to 5 mg daily with hope this may alleviate some of her tardive dyskinesia symptoms though she will need to follow-up with psychiatry for ongoing management.

## 2022-03-27 NOTE — Progress Notes (Signed)
   Danielle Cox is a 62 y.o. female who presents today for an office visit.  Assessment/Plan:  Chronic Problems Addressed Today: Tardive dyskinesia Discussed with patient that her symptoms are consistent with tardive dyskinesia likely secondary to her longstanding Abilify prescription.  She has recently cut back on the dose of Abilify from 20 mg to 10 mg.  Psychiatry has been managing this and she is already been on Ingrezza, austedo, and Sinemet without much improvement in her symptoms.  She has no other red flag signs or symptoms today.  No signs of infection.  No abnormalities on neuroexam.   Advised her to cut her dose of Abilify to 5 mg daily and follow-up with psychiatry ASAP for ongoing management.  She is aware reasons to return to care or seek emergent care.  She would like to be referred to different psychiatrist.  Will place referral today advised her to reach out to her current psychiatrist ASAP as above.  Essential hypertension Blood pressure at today off meds.  Bipolar affective disorder [facility]) She follows with psychiatry.  She is currently on Abilify 10 mg daily, Cymbalta 120 mg daily, trazodone 150 mg daily.  Discussed with patient that the Abilify is likely causing her symptoms and advised her to follow-up with psychiatry soon as above.  We will decrease her Abilify to 5 mg daily with hope this may alleviate some of her tardive dyskinesia symptoms though she will need to follow-up with psychiatry for ongoing management.     Subjective:  HPI:  Patient here with abnormal movements. This has been going on for several months to years. She is her feet constantly, picking her skin, humming. She is currently following with psychiatry for bipolar affective disorder and is currently on Abilify, Cymbalta, and trazodone.  Overall her mood seems stable however she is concerned that she may be having side effect with the medications.    Her psychiatrist has been managing her abnormal  movements and she has tried ingrezza and austedo. This helped modestly but then stopped recently. Things seem to be getting worse the last few weeks. She was started on sinemet a few weeks ago to help with her symptoms. This did not help. Started getting chills and sweats last week. She called her psychiatrist who told her to go to the emergency room.  She is no longer having chills and sweats.  Still has quite a bit of abnormal movements.  She is aware that she is doing this.  No reported SI or HI.  No reported weakness or numbness.       Objective:  Physical Exam: BP 115/71   Pulse (!) 101   Temp (!) 97.3 F (36.3 C) (Temporal)   Ht 5\' 5"  (1.651 m)   Wt 176 lb 6.4 oz (80 kg)   LMP 02/12/2016   SpO2 98%   BMI 29.35 kg/m   Gen: No acute distress, resting comfortably CV: Regular rate and rhythm with no murmurs appreciated Pulm: Normal work of breathing, clear to auscultation bilaterally with no crackles, wheezes, or rhonchi Neuro: Continuous rhythmic movement involving bilateral feet Psych: Normal affect and thought content      [name] M. Jerline Pain, MD 03/27/2022 9:51 AM

## 2022-03-27 NOTE — Patient Instructions (Signed)
It was very nice to see you today!  Your symptoms are due to medication side effect.  Please decrease your dose of Abilify.  Please contact your psychiatrist soon to discuss next steps in management.  Take care, Dr Jerline Pain  PLEASE NOTE:  If you had any lab tests, please let us know if you have not heard back within a few days. You may see your results on mychart before we have a chance to review them but we will give you a call once they are reviewed by [place].   If we ordered any referrals today, please let us know if you have not heard from their office within the next week.   If you had any urgent prescriptions sent in today, please check with the pharmacy within an hour of our visit to make sure the prescription was transmitted appropriately.   Please try these tips to maintain a healthy lifestyle:  Eat at least 3 REAL meals and 1-2 snacks per day.  Aim for no more than 5 hours between eating.  If you eat breakfast, please do so within one hour of getting up.   Each meal should contain half fruits/vegetables, one quarter protein, and one quarter carbs (no bigger than a computer mouse)  Cut down on sweet beverages. This includes juice, soda, and sweet tea.   Drink at least 1 glass of water with each meal and aim for at least 8 glasses per day  Exercise at least 150 minutes every week.

## 2022-03-28 ENCOUNTER — Ambulatory Visit (INDEPENDENT_AMBULATORY_CARE_PROVIDER_SITE_OTHER): Payer: Medicare PPO | Admitting: *Deleted

## 2022-03-28 DIAGNOSIS — J449 Chronic obstructive pulmonary disease, unspecified: Secondary | ICD-10-CM

## 2022-03-28 DIAGNOSIS — I10 Essential (primary) hypertension: Secondary | ICD-10-CM

## 2022-03-28 NOTE — Patient Instructions (Signed)
Please call the care guide team at [phone] if you need to cancel or reschedule your appointment.   If you are experiencing a Mental Health or [place] or need someone to talk to, please call the Suicide and Crisis Lifeline: 988 call the [place] National Suicide Prevention Lifeline: (804)043-1390 or TTY: (480) 112-6701 TTY 9801544762) to talk to a trained counselor call 1-800-273-TALK (toll free, 24 hour hotline) go to [facility] [address] [place] [phone]) call 911   Following is a copy of the CCM Program Consent:  CCM service includes personalized support from designated clinical staff supervised by the physician, including individualized plan of care and coordination with other care providers 24/7 contact phone numbers for assistance for urgent and routine care needs. Service will only be billed when office clinical staff spend 20 minutes or more in a month to coordinate care. Only one practitioner may furnish and bill the service in a calendar month. The patient may stop CCM services at amy time (effective at the end of the month) by phone call to the office staff. The patient will be responsible for cost sharing (co-pay) or up to 20% of the service fee (after annual deductible is met)  Following is a copy of your full provider care plan:   Goals Addressed             This Visit's Progress    CCM (COPD) EXPECTED OUTCOME:  MONITOR, SELF-MANAGE AND REDUCE SYMPTOMS OF COPD       Current Barriers:  Knowledge Deficits related to COPD management Chronic Disease Management support and education needs related to COPD, action plan Patient reports she is trying to be careful with allergy season, verbalizes understanding of calling doctor early on for any respiratory issues Patient reports she is taking medications as prescribed  Planned Interventions: Provided instruction about proper use of medications used for  management of COPD including inhalers Advised patient to self assesses COPD action plan zone and make appointment with provider if in the yellow zone for 48 hours without improvement Advised patient to engage in light exercise as tolerated 3-5 days a week to aid in the the management of COPD Provided education about and advised patient to utilize infection prevention strategies to reduce risk of respiratory infection Reviewed upcoming scheduled appointments  Symptom Management: Take medications as prescribed   Attend all scheduled provider appointments Call pharmacy for medication refills 3-7 days in advance of running out of medications Attend church or other social activities Perform all self care activities independently  Perform IADL's (shopping, preparing meals, housekeeping, managing finances) independently Call provider office for new concerns or questions  identify and remove indoor air pollutants limit outdoor activity during cold weather listen for public air quality announcements every day do breathing exercises every day develop a rescue plan eliminate symptom triggers at home follow rescue plan if symptoms flare-up eat healthy/prescribed diet: heart healthy, low sodium get at least 7 to 8 hours of sleep at night practice relaxation or meditation daily Follow COPD action plan Wear a mask outdoors with pollen if needed  Follow Up Plan: Telephone follow up appointment with care management team member scheduled for:  06/14/22 at 9 am        CCM (HYPERTENSION) EXPECTED OUTCOME: MONITOR, SELF-MANAGE AND REDUCE SYMPTOMS OF HYPERTENSION       Current Barriers:  Knowledge Deficits related to Hypertension management Chronic Disease Management support and education needs related to Hypertension, diet Patient reports she lives with  spouse, is independent in all aspects of her care, continues to drive, has fibromyalgia which affects her ability to exercise, does stretching exercises  in the morning. Patient reports she checks blood pressure on occasion with readings "always good" Patient reports she has been taken off Sinemet because of side effects and is currently titrating Abilify (on '5mg'$  now) and will be completely off this medication due to side effects related to probable tardive dyskinesia, pt reports she is calling her psychiatrist today for asap follow up and is in process of getting a new psychiatrist.  Planned Interventions: Evaluation of current treatment plan related to hypertension self management and patient's adherence to plan as established by provider;   Reviewed medications with patient and discussed importance of compliance;  Counseled on the importance of exercise goals with target of 150 minutes per week Advised patient, providing education and rationale, to monitor blood pressure daily and record, calling PCP for findings outside established parameters;  Provided education on prescribed diet low sodium;  Discussed complications of poorly controlled blood pressure such as heart disease, stroke, circulatory complications, vision complications, kidney impairment, sexual dysfunction;   Symptom Management: Take medications as prescribed   Attend all scheduled provider appointments Call pharmacy for medication refills 3-7 days in advance of running out of medications Attend church or other social activities Perform all self care activities independently  Perform IADL's (shopping, preparing meals, housekeeping, managing finances) independently Call provider office for new concerns or questions  check blood pressure weekly write blood pressure results in a log or diary learn about high blood pressure keep a blood pressure log take blood pressure log to all doctor appointments keep all doctor appointments take medications for blood pressure exactly as prescribed begin an exercise program report new symptoms to your doctor eat more whole grains, fruits  and vegetables, lean meats and healthy fats Follow low sodium diet Bake or broil foods instead of frying  Take medications exactly as prescribed and titrate Abilify as instructed until you are completely off the medication  Follow Up Plan: Telephone follow up appointment with care management team member scheduled for:   06/14/22 at 9 am          Patient verbalizes understanding of instructions and care plan provided today and agrees to view in [place]. Active MyChart status and patient understanding of how to access instructions and care plan via MyChart confirmed with patient.     Tardive Dyskinesia Tardive dyskinesia is a disorder that causes uncontrollable body movements. It occurs in some people who are taking certain medicines to treat a mental illness (neuroleptic medicine) or have taken this type of medicine in the past. These medicines block the effects of a specific brain chemical called dopamine.  Sometimes, tardive dyskinesia starts months or years after someone took the medicine. Not everyone who takes a neuroleptic medicine will get tardive dyskinesia. What are the causes? This condition is caused by changes in your brain that are associated with taking a neuroleptic medicine. What increases the risk? If you are taking a neuroleptic medicine, your risk for tardive dyskinesia may be higher if: You are taking an older type of neuroleptic medicine. You have been taking the medicine for a long time at a high dose. You are a woman past the age of menopause. You are older than 60 years. You have a history of alcohol or drug abuse. What are the signs or symptoms? Abnormal, uncontrollable movements are the main symptom of tardive dyskinesia. These types of movements  may include: Grimacing. Sticking out or twisting your tongue. Making chewing or sucking sounds. Blinking your eyes. Twisting, swaying, or thrusting your body. Foot tapping or finger waving. Rapid movements of your  arms or legs. How is this diagnosed? Your health care provider may suspect that you have tardive dyskinesia if: You have been taking neuroleptic medicines. You have abnormal movements that you cannot control. If you are taking a medicine that can cause tardive dyskinesia, your health care provider may screen you for early signs of the condition. This may include: Observing your body movements. Using a specific rating scale called the Abnormal Involuntary Movement Scale (AIMS). You may also have tests to rule out other conditions that cause abnormal body movements, including: Parkinson's disease. Huntington's disease. Stroke. How is this treated? The best treatment for tardive dyskinesia is to lower the dose of your medicine or to switch to a different medicine at the first sign of abnormal and uncontrolled movements. There is no cure for long-term (chronic) tardive dyskinesia. Some medicines may help control the movements. These include: Clozapine, a medicine used to treat mental illness (antipsychotic). Some muscle relaxants. Some anti-seizure medicines. Some medicines used to treat high blood pressure. Some tranquilizers (sedatives). Follow these instructions at home:     Take over-the-counter and prescription medicines only as told by your health care provider. Do not stop or start taking any medicines without talking to your health care provider first. Do not abuse drugs or alcohol. Keep all follow-up visits. This is important. Contact a health care provider if: You are unable to eat or drink. You have had a fall. Your symptoms get worse. Summary Tardive dyskinesia is a disorder that causes uncontrollable body movements. These may include grimacing, sticking out or twisting your tongue, blinking your eyes, or rapid movements of your arms or legs. The condition occurs in some people who are taking certain medicines to treat a mental illness or have taken this type of medicine in  the past. The best treatment for tardive dyskinesia is to lower the dose of your medicine or to switch to a different medicine at the first sign of abnormal and uncontrolled movements. There is no cure for long-term (chronic) tardive dyskinesia, but some medicines may help control the movements. This information is not intended to replace advice given to you by your health care provider. Make sure you discuss any questions you have with your health care provider. Document Revised: 11/27/2020 Document Reviewed: 11/27/2020 Elsevier Patient Education  [place].   Telephone follow up appointment with care management team member scheduled for:

## 2022-03-28 NOTE — Chronic Care Management (AMB) (Signed)
Chronic Care Management   CCM RN Visit Note  03/28/2022 Name: Danielle Cox MRN: [id number] DOB: 23-Aug-1960  Subjective: Danielle Cox is a 62 y.o. year old female who is a primary care patient of Vivi Barrack, MD. The patient was referred to the Chronic Care Management team for assistance with care management needs subsequent to provider initiation of CCM services and plan of care.    Today's Visit:  Engaged with patient by telephone for follow up visit.        Goals Addressed             This Visit's Progress    CCM (COPD) EXPECTED OUTCOME:  MONITOR, SELF-MANAGE AND REDUCE SYMPTOMS OF COPD       Current Barriers:  Knowledge Deficits related to COPD management Chronic Disease Management support and education needs related to COPD, action plan Patient reports she is trying to be careful with allergy season, verbalizes understanding of calling doctor early on for any respiratory issues Patient reports she is taking medications as prescribed  Planned Interventions: Provided instruction about proper use of medications used for management of COPD including inhalers Advised patient to self assesses COPD action plan zone and make appointment with provider if in the yellow zone for 48 hours without improvement Advised patient to engage in light exercise as tolerated 3-5 days a week to aid in the the management of COPD Provided education about and advised patient to utilize infection prevention strategies to reduce risk of respiratory infection Reviewed upcoming scheduled appointments  Symptom Management: Take medications as prescribed   Attend all scheduled provider appointments Call pharmacy for medication refills 3-7 days in advance of running out of medications Attend church or other social activities Perform all self care activities independently  Perform IADL's (shopping, preparing meals, housekeeping, managing finances) independently Call provider office for new  concerns or questions  identify and remove indoor air pollutants limit outdoor activity during cold weather listen for public air quality announcements every day do breathing exercises every day develop a rescue plan eliminate symptom triggers at home follow rescue plan if symptoms flare-up eat healthy/prescribed diet: heart healthy, low sodium get at least 7 to 8 hours of sleep at night practice relaxation or meditation daily Follow COPD action plan Wear a mask outdoors with pollen if needed  Follow Up Plan: Telephone follow up appointment with care management team member scheduled for:  06/14/22 at 9 am        CCM (HYPERTENSION) EXPECTED OUTCOME: MONITOR, SELF-MANAGE AND REDUCE SYMPTOMS OF HYPERTENSION       Current Barriers:  Knowledge Deficits related to Hypertension management Chronic Disease Management support and education needs related to Hypertension, diet Patient reports she lives with spouse, is independent in all aspects of her care, continues to drive, has fibromyalgia which affects her ability to exercise, does stretching exercises in the morning. Patient reports she checks blood pressure on occasion with readings "always good" Patient reports she has been taken off Sinemet because of side effects and is currently titrating Abilify (on '5mg'$  now) and will be completely off this medication due to side effects related to probable tardive dyskinesia, pt reports she is calling her psychiatrist today for asap follow up and is in process of getting a new psychiatrist.  Planned Interventions: Evaluation of current treatment plan related to hypertension self management and patient's adherence to plan as established by provider;   Reviewed medications with patient and discussed importance of compliance;  Counseled on the importance  of exercise goals with target of 150 minutes per week Advised patient, providing education and rationale, to monitor blood pressure daily and record,  calling PCP for findings outside established parameters;  Provided education on prescribed diet low sodium;  Discussed complications of poorly controlled blood pressure such as heart disease, stroke, circulatory complications, vision complications, kidney impairment, sexual dysfunction;   Symptom Management: Take medications as prescribed   Attend all scheduled provider appointments Call pharmacy for medication refills 3-7 days in advance of running out of medications Attend church or other social activities Perform all self care activities independently  Perform IADL's (shopping, preparing meals, housekeeping, managing finances) independently Call provider office for new concerns or questions  check blood pressure weekly write blood pressure results in a log or diary learn about high blood pressure keep a blood pressure log take blood pressure log to all doctor appointments keep all doctor appointments take medications for blood pressure exactly as prescribed begin an exercise program report new symptoms to your doctor eat more whole grains, fruits and vegetables, lean meats and healthy fats Follow low sodium diet Bake or broil foods instead of frying  Take medications exactly as prescribed and titrate Abilify as instructed until you are completely off the medication  Follow Up Plan: Telephone follow up appointment with care management team member scheduled for:   06/14/22 at 9 am          Plan:Telephone follow up appointment with care management team member scheduled for:  06/14/22 at 9 am  Jacqlyn Larsen [facility], BSN RN Case Manager [place] at [employer] [phone]

## 2022-04-15 DIAGNOSIS — F17210 Nicotine dependence, cigarettes, uncomplicated: Secondary | ICD-10-CM

## 2022-04-15 DIAGNOSIS — J449 Chronic obstructive pulmonary disease, unspecified: Secondary | ICD-10-CM | POA: Diagnosis not present

## 2022-04-15 DIAGNOSIS — I10 Essential (primary) hypertension: Secondary | ICD-10-CM | POA: Diagnosis not present

## 2022-04-30 ENCOUNTER — Telehealth: Payer: Self-pay | Admitting: Pharmacist

## 2022-04-30 NOTE — Progress Notes (Signed)
Care Management & Coordination Services Pharmacy Team  Reason for Encounter: Appointment Reminder  Contacted patient to confirm telephone appointment with Erskine Emery, PharmD on 05/01/2022 at 9 am. Unsuccessful outreach. Left voicemail with appointment details.   Star Rating Drugs:  Atorvastatin 20 mg last filled 02/26/2022 90 DS   Care Gaps: Annual wellness visit in last year? Yes  Future Appointments  Date Time Provider Department Center  05/01/2022  9:00 AM Erroll Luna, [place] [facility] None  05/02/2022  9:00 AM Anson Fret, MD [facility] None  06/14/2022  9:00 AM [facility]  08/30/2022  1:45 PM [facility] ANNUAL WELLNESS VISIT 1 [facility]   April D Calhoun, [facility] Clinical Pharmacist Assistant [phone]

## 2022-05-01 ENCOUNTER — Encounter: Payer: Medicare PPO | Admitting: Pharmacist

## 2022-05-01 NOTE — Progress Notes (Incomplete)
Care Management & Coordination Services Pharmacy Note  05/01/2022 Name:  Danielle Cox MRN:  [id number] DOB:  10-27-1960  Summary: ***  Recommendations/Changes made from today's visit: ***  Follow up plan: ***   Subjective: Danielle Cox is an 62 y.o. year old female who is a primary patient of [name], Katina Degree, MD.  The care coordination team was consulted for assistance with disease management and care coordination needs.    {CCMTELEPHONEFACETOFACE:21091510} for {CCMINITIALFOLLOWUPCHOICE:21091511}.  Recent office visits: ***  Recent consult visits: ***  Hospital visits: {Hospital DC Yes/No:25215}   Objective:  Lab Results  Component Value Date   CREATININE 0.79 04/18/2021   BUN 17 04/18/2021   GFR 82.20 04/18/2021   GFRNONAA >60 05/15/2019   GFRAA >60 05/15/2019   NA 143 04/18/2021   K 4.4 04/18/2021   CALCIUM 9.8 04/18/2021   CO2 29 04/18/2021   GLUCOSE 111 (H) 04/18/2021    Lab Results  Component Value Date/Time   HGBA1C 5.7 04/18/2021 12:17 PM   HGBA1C 6.0 04/05/2020 11:51 AM   GFR 82.20 04/18/2021 12:17 PM   GFR 96.07 04/05/2020 11:51 AM    Last diabetic Eye exam: No results found for: "HMDIABEYEEXA"  Last diabetic Foot exam: No results found for: "HMDIABFOOTEX"   Lab Results  Component Value Date   CHOL 207 (H) 04/18/2021   HDL 56.20 04/18/2021   LDLCALC 128 (H) 04/18/2021   LDLDIRECT 173.0 02/11/2018   TRIG 116.0 04/18/2021   CHOLHDL 4 04/18/2021       Latest Ref Rng & Units 04/18/2021   12:17 PM 04/05/2020   11:51 AM 05/15/2019   12:46 PM  Hepatic Function  Total Protein 6.0 - 8.3 g/dL 6.4  6.5  6.7   Albumin 3.5 - 5.2 g/dL 4.6  4.4  4.2   AST 0 - 37 U/L 15  15  25    ALT 0 - 35 U/L 23  24  34   Alk Phosphatase 39 - 117 U/L 62  72  60   Total Bilirubin 0.2 - 1.2 mg/dL 0.3  0.3  0.5     Lab Results  Component Value Date/Time   TSH 1.00 04/18/2021 12:17 PM   TSH 0.75 04/05/2020 11:51 AM   FREET4 0.82 08/20/2018 09:31 AM        Latest Ref Rng & Units 04/18/2021   12:17 PM 04/05/2020   11:51 AM 05/15/2019   12:46 PM  CBC  WBC 4.0 - 10.5 K/uL 9.6  7.9  11.3   Hemoglobin 12.0 - 15.0 g/dL 40.9  81.1  91.4   Hematocrit 36.0 - 46.0 % 44.8  45.4  45.4   Platelets 150.0 - 400.0 K/uL 269.0  248.0  278     Lab Results  Component Value Date/Time   VD25OH 32.99 04/18/2021 12:17 PM   VD25OH 27.69 (L) 05/02/2016 09:42 AM   VITAMINB12 437 04/18/2021 12:17 PM   VITAMINB12 749 05/07/2017 01:27 PM    Clinical ASCVD: {YES/NO:21197} The 10-year ASCVD risk score (Arnett DK, et al., 2019) is: 5.3%   Values used to calculate the score:     Age: 82 years     Sex: Female     Is Non-Hispanic African American: No     Diabetic: No     Tobacco smoker: Yes     Systolic Blood Pressure: 115 mmHg     Is BP treated: No     HDL Cholesterol: 56.2 mg/dL     Total Cholesterol: 207 mg/dL    ***  Other: (CHADS2VASc if Afib, MMRC or CAT for COPD, ACT, DEXA)     03/27/2022    9:39 AM 01/29/2022   12:46 PM 12/13/2021    7:41 AM  Depression screen PHQ 2/9  Decreased Interest 3 0 0  Down, Depressed, Hopeless 3 2 0  PHQ - 2 Score 6 2 0  Altered sleeping 3    Tired, decreased energy 3    Change in appetite 2    Feeling bad or failure about yourself  1    Trouble concentrating 3    Moving slowly or fidgety/restless 3    Suicidal thoughts 0    PHQ-9 Score 21    Difficult doing work/chores Extremely dIfficult       Social History   Tobacco Use  Smoking Status Every Day   Packs/day: 1.50   Years: 40.00   Additional pack years: 0.00   Total pack years: 60.00   Types: Cigarettes  Smokeless Tobacco Never   BP Readings from Last 3 Encounters:  03/27/22 115/71  04/18/21 123/81  11/08/20 140/90   Pulse Readings from Last 3 Encounters:  03/27/22 (!) 101  04/18/21 (!) 105  11/08/20 96   Wt Readings from Last 3 Encounters:  03/27/22 176 lb 6.4 oz (80 kg)  12/13/21 180 lb (81.6 kg)  04/18/21 179 lb 6.4 oz (81.4 kg)   BMI  Readings from Last 3 Encounters:  03/27/22 29.35 kg/m  12/13/21 29.95 kg/m  04/18/21 29.85 kg/m    Allergies  Allergen Reactions   Moxifloxacin Hcl Palpitations    REACTION: tackycardia   Penicillins Rash    Has patient had a PCN reaction causing immediate rash, facial/tongue/throat swelling, SOB or lightheadedness with hypotension: Yes Has patient had a PCN reaction causing severe rash involving mucus membranes or skin necrosis: No Has patient had a PCN reaction that required hospitalization: No Has patient had a PCN reaction occurring within the last 10 years: No If all of the above answers are "NO", then may proceed with Cephalosporin use.    Corticosteroids Other (See Comments)    Mania    Medications Reviewed Today     Reviewed by Audrie Gallus, RN (Registered Nurse) on 03/28/22 at 0932  Med List Status: <None>   Medication Order Taking? Sig Documenting Provider Last Dose Status Informant  acetaminophen (TYLENOL) 500 MG tablet [id number] Yes Take 2 tablets (1,000 mg total) by mouth every 6 (six) hours as needed for mild pain or headache. Rankin, Shuvon B, NP Taking Active   ARIPiprazole (ABILIFY) 10 MG tablet [id number] Yes Take 10 mg by mouth in the morning and at bedtime. [provider] Taking Active Self  atorvastatin (LIPITOR) 20 MG tablet [id number] Yes TAKE 1 TABLET BY MOUTH DAILY [name], MD Taking Active   azelastine (ASTELIN) 0.1 % nasal spray [id number] Yes Place 2 sprays into both nostrils 2 (two) times daily. [name], MD Taking Active   carbidopa-levodopa (SINEMET IR) 10-100 MG tablet [id number] No Take 1 tablet by mouth 3 (three) times daily.  Patient not taking: Reported on 03/28/2022   [provider] Not Taking Active   diclofenac (VOLTAREN) 75 MG EC tablet [id number] Yes Take 1 tablet (75 mg total) by mouth 2 (two) times daily. [name], MD Taking Active   dicyclomine (BENTYL) 20 MG tablet [id number] Yes Take 20 mg by  mouth in the morning and at bedtime. [provider] Taking Active   DULoxetine (CYMBALTA) 60 MG capsule [id number] Yes Take  60 mg by mouth daily. 120 mg in the am [provider] Taking Active   hydrOXYzine (ATARAX/VISTARIL) 25 MG tablet [id number] Yes Take 25 mg by mouth 3 (three) times daily as needed. [provider] Taking Active   modafinil (PROVIGIL) 200 MG tablet [id number] Yes Take 1 tablet (200 mg total) by mouth in the morning and at bedtime.  Patient taking differently: Take 200 mg by mouth in the morning.   [name], MD Taking Active   omeprazole (PRILOSEC) 20 MG capsule [id number] Yes Take 20 mg by mouth daily. [provider] Taking Active   SYMBICORT 80-4.5 MCG/ACT inhaler [id number] Yes INHALE 2 PUFFS BY MOUTH TWICE A DAY [name], MD Taking Active   TEMAZEPAM PO [id number] Yes Take by mouth. [provider] Taking Active   traZODone (DESYREL) 150 MG tablet [id number] Yes Take by mouth at bedtime. [provider] Taking Active             SDOH:  (Social Determinants of Health) assessments and interventions performed: {yes/no:20286} SDOH Interventions    Flowsheet Row Chronic Care Management from 01/29/2022 in [facility] Clinical Support from 08/24/2021 in [facility] [employer] from 08/13/2018 in [facility] [employer] from 01/25/2017 in confidential department Counselor from 01/17/2017 in confidential department Counselor from 01/04/2017 in confidential department  SDOH Interventions        Food Insecurity Interventions Intervention Not Indicated -- -- -- -- --  Housing Interventions Intervention Not Indicated -- -- -- -- --  Transportation Interventions Intervention Not Indicated -- -- -- -- --  Utilities Interventions Intervention Not Indicated -- -- -- -- --  Depression Interventions/Treatment  -- Counseling, Medication, Currently on Treatment  Medication, Counseling --  [Patient transitioning to IOP] --  [Patient still attending PHP] Currently on Treatment  Financial Strain Interventions Intervention Not Indicated -- -- -- -- --  Physical Activity Interventions Intervention Not Indicated  [pt does stretching, is limited with ability to exercise due to fibromyalgia] -- -- -- -- --  Stress Interventions Intervention Not Indicated -- -- -- -- --  Social Connections Interventions Intervention Not Indicated -- -- -- -- --       Medication Assistance: {MEDASSISTANCEINFO:25044}  Medication Access: Within the past 30 days, how often has patient missed a dose of medication? *** Is a pillbox or other method used to improve adherence? {YES/NO:21197} Factors that may affect medication adherence? {CHL DESC; BARRIERS:21522} Are meds synced by current pharmacy? {YES/NO:21197} Are meds delivered by current pharmacy? {YES/NO:21197} Does patient experience delays in picking up medications due to transportation concerns? {YES/NO:21197}  [facility] Reviewed: Is patient disadvantaged to use [facility]?: {YES/NO:21197} Current Rx insurance plan: *** Name and location of Current pharmacy:  [facility] - [place], [place] - [address]  [place] [address] Phone: [phone] Fax: [phone]  [facility] [id number] - [place], [place] - [address] [place] [address] Phone: [phone] Fax: [phone]  [id number] [place], [place] - [address] AT [facility] OF [address] [place] [address] Phone: [phone] Fax: [phone]  [facility] services reviewed with patient today?: {YES/NO:21197} Patient requests to transfer care to [facility]?: {YES/NO:21197} Reason patient declined to change pharmacies: {US patient preference:27474}  Compliance/Adherence/Medication fill  history: Star Rating Drugs:  Atorvastatin 20 mg last filled 02/26/2022 90 DS     Care Gaps:  Annual wellness visit in last year? Yes   Assessment/Plan     Hypertension (BP goal <130/80) -Controlled, not assessed -Current treatment: None -Medications previously tried: triamterene/HCTZ  -Denies hypotensive/hypertensive symptoms -Educated on BP goals and benefits of medications for prevention of heart attack, stroke and kidney damage; Symptoms of hypotension and importance of maintaining adequate hydration; -Counseled to monitor BP at home a few times per week, document, and provide log at future appointments -Recommended to continue current medication  COPD (Goal: control symptoms and prevent exacerbations) 10/31/21 -Controlled, now using Symbicort daily as maintenance -Current treatment  Symbicort 80-4.68mcg 2 puffs BID Appropriate, Query effective -Medications previously tried: montelukast  -MMRC/CAT score:      View : No data to display.         -Pulmonary function testing: Pulmonary Functions Testing Results: No results found for: FEV1, FVC, FEV1FVC, TLC, DLCO  -Exacerbations requiring treatment in last 6 months: none -Patient denies consistent use of maintenance inhaler -Frequency of rescue inhaler use: none -Counseled on Proper inhaler technique; Benefits of consistent maintenance inhaler use When to use rescue inhaler Differences between maintenance and rescue inhalers She is using her Symbicort daily and reports this has helped her breathing.  She does not currently have rescue and does not feel like he she needs it at this time. No changes to her meds at this time for COPD - continue Symbicort.  Depression/Anxiety (Goal: Minimize symptoms) -Controlled, not assessed -Current treatment: Duloxetine 60mg  QAM Appropriate, Effective, Safe, Accessible Abilify 10mg  Appropriate, Effective, Safe, Accessible -Medications previously tried/failed: Fluoxetine, Latuda,  Paxil, Rexulti -PHQ9:     09/22/2020   10:26 AM 08/19/2020    2:40 PM 04/14/2019    8:32 AM  PHQ9 SCORE ONLY  PHQ-9 Total Score 13 1 1     -GAD7:     04/14/2019    8:32 AM 01/25/2017    9:00 AM 01/02/2017    9:00 AM  GAD 7 : Generalized Anxiety Score  Nervous, Anxious, on Edge 1 3 3   Control/stop worrying 1 3 3   Worry too much - different things 1 3 3   Trouble relaxing 1 3 3   Restless 1 2 3   Easily annoyed or irritable 1 3 3   Afraid - awful might happen 1 3 3   Total GAD 7 Score 7 20 21   Anxiety Difficulty Somewhat difficult Extremely difficult Extremely difficult  -Feels like the initiation of Duloxetine is helping.  No concerns at this time. She is followed by psych for management of these meds. -Recommended to continue current medication  Fibromyalgia (Goal: Minimize pain) 10/31/21 -Controlled -Current treatment  Duloxetine 60 mg qam Appropriate, Effective, Safe, Accessible -Medications previously tried: none noted -Believes Duloxetine is helping pain  -Recommended to continue current medication -Having increased fibromyalgia pain since cutting back on her Cymbalta.  Discussed potentially increasing but she sees psych regularly and will see her soon regarding increasing this. No changes at this time - continue to follow psych regularly.           Willa Frater, PharmD Clinical Pharmacist  [facility] [phone]

## 2022-05-02 ENCOUNTER — Ambulatory Visit: Payer: Medicare PPO | Admitting: Neurology

## 2022-05-02 ENCOUNTER — Encounter: Payer: Self-pay | Admitting: Neurology

## 2022-05-02 VITALS — BP 121/78 | HR 88 | Ht 65.5 in | Wt 177.2 lb

## 2022-05-02 DIAGNOSIS — F02818 Dementia in other diseases classified elsewhere, unspecified severity, with other behavioral disturbance: Secondary | ICD-10-CM

## 2022-05-02 DIAGNOSIS — R453 Demoralization and apathy: Secondary | ICD-10-CM | POA: Diagnosis not present

## 2022-05-02 DIAGNOSIS — G2571 Drug induced akathisia: Secondary | ICD-10-CM

## 2022-05-02 DIAGNOSIS — M359 Systemic involvement of connective tissue, unspecified: Secondary | ICD-10-CM

## 2022-05-02 DIAGNOSIS — G3109 Other frontotemporal dementia: Secondary | ICD-10-CM

## 2022-05-02 DIAGNOSIS — F03A18 Unspecified dementia, mild, with other behavioral disturbance: Secondary | ICD-10-CM

## 2022-05-02 DIAGNOSIS — G2581 Restless legs syndrome: Secondary | ICD-10-CM | POA: Diagnosis not present

## 2022-05-02 DIAGNOSIS — G309 Alzheimer's disease, unspecified: Secondary | ICD-10-CM

## 2022-05-02 DIAGNOSIS — R4189 Other symptoms and signs involving cognitive functions and awareness: Secondary | ICD-10-CM | POA: Diagnosis not present

## 2022-05-02 DIAGNOSIS — R4689 Other symptoms and signs involving appearance and behavior: Secondary | ICD-10-CM

## 2022-05-02 NOTE — Progress Notes (Signed)
[facility]    Provider:  Dr Lucia Gaskins Requesting Provider: Ellis Savage, NP Primary Care Provider:  [name], MD  CC:  abnormal humming/movements  HPI:  Danielle Cox is a 62 y.o. female here as requested by Ellis Savage, NP for abnormal movements. Feet for years on an off, worse with medications, she has been on neuroleptics, Husband says she has been on "almost evrything" psychiatric throughout the years. Legs first, for years she will go a year without the movements and then start up and wear shoes put she does it so much, more consistent over the last 4-5 years, she feels like something crawley in her legs, have to move them, stretching doesn;t help, keeps her up at night. Humming, grunting all the time very low more when doing things and some open moutj vowel sounds more "ah" , n ocd symptoms. She can stop the movements but this incredible urge happens and then she to start again, voluntary but has to do it, she can force herself to stop it, smokes a lot. Always picking at the chin, hardly eats, she has mouth movements, constantly n motion doing something. Need to watch her and see if she doing the movements. No ballistic movements. Ingresa helped but not entirealy.   Increasingly inappropriate social behavior.Has had episodes of violence usually:: MILD Loss of empathy and other interpersonal skills. For example, not being sensitive to another person's feelings. NO Lack of judgment.YES: eating: in the middle of the night Loss of inhibition:.NO Lack of interest, also known as apathy. Apathy can be mistaken for depression.:YES Compulsive behaviors such as tapping, clapping, or smacking lips over and over:.YES Short term memory changes, losing things, not repeating, not asking same questions: NO A decline in personal hygiene. "Somewhat" Changes in eating habits. People with FTD typically overeat or prefer to eat sweets and carbohydrates: YES She is More prone to  sweets and carbs. Compulsively wanting to put things in the mouth.: YES cigarettes Speech and language symptoms: NO Some subtypes of frontotemporal dementia lead to changes in language ability or loss of speech. Subtypes include primary progressive aphasia, semantic dementia and progressive agrammatic aphasia, also known as progressive nonfluent aphasia: . NO Tremor.NO Rigidity.NO Muscle spasms or twitches. YES Poor coordination.NO Trouble swallowing. NO Muscle weakness. YES Inappropriate laughing or crying.N) Falls or trouble walking.NO   Reviewed notes, labs and imaging from outside physicians, which showed:  Recent Results (from the past 2160 hour(s))  Iron, TIBC and Ferritin Panel     Status: None   Collection Time: 05/02/22 10:24 AM  Result Value Ref Range   Total Iron Binding Capacity 300 250 - 450 ug/dL   UIBC 161 096 - 045 ug/dL   Iron 409 27 - 811 ug/dL   Iron Saturation 38 15 - 55 %   Ferritin 87 15 - 150 ng/mL  T3 Uptake     Status: None   Collection Time: 05/02/22 10:24 AM  Result Value Ref Range   T3 Uptake Ratio 26 24 - 39 %  Rheumatoid Factor     Status: None   Collection Time: 05/02/22 10:24 AM  Result Value Ref Range   Rheumatoid fact SerPl-aCnc <10.0 <14.0 IU/mL  ANA Comprehensive Panel     Status: None   Collection Time: 05/02/22 10:24 AM  Result Value Ref Range   dsDNA Ab <1 0 - 9 IU/mL    Comment:  Negative      <5                                    Equivocal  5 - 9                                    Positive      >9    ENA RNP Ab 0.7 0.0 - 0.9 AI   ENA SM Ab Ser-aCnc <0.2 0.0 - 0.9 AI   Scleroderma (Scl-70) (ENA) Antibody, IgG <0.2 0.0 - 0.9 AI   ENA SSA (RO) Ab <0.2 0.0 - 0.9 AI   ENA SSB (LA) Ab <0.2 0.0 - 0.9 AI   Chromatin Ab SerPl-aCnc <0.2 0.0 - 0.9 AI   Anti JO-1 <0.2 0.0 - 0.9 AI   Centromere Ab Screen <0.2 0.0 - 0.9 AI   See below: Comment     Comment: Autoantibody                       Disease  Association ------------------------------------------------------------                         Condition                  Frequency ---------------------   ------------------------   --------- Antinuclear Antibody,    SLE, mixed connective Direct (ANA-D)           tissue diseases ---------------------   ------------------------   --------- dsDNA                    SLE                        40 - 60% ---------------------   ------------------------   --------- Chromatin                Drug induced SLE                90%                          SLE                        48 - 97% ---------------------   ------------------------   --------- SSA (Ro)                 SLE                        25 - 35%                          Sjogren's Syndrome         40 - 70%                          Neonatal Lupus                 100% ---------------------   ------------------------   --------- SSB (La)                 SLE  10%                          Sjogren's Syndrome              30% ---------------------   -----------------------    --------- Sm (anti-Smith)          SLE                        15 - 30% ---------------------   -----------------------    --------- RNP                      Mixed Connective Tissue                          Disease                         95% (U1 nRNP,                SLE                        30 - 50% anti-ribonucleoprotein)  Polymyositis and/or                          Dermatomyositis                 20% ---------------------   ------------------------   --------- Scl-70 (antiDNA          Scleroderma (diffuse)      20 - 35% topoisomerase)           Crest                           13% ---------------------   ------------------------   --------- Jo-1                     Polymyositis and/or                          Dermatomyositis            20 - 40% ---------------------   ------------------------   --------- Centromere B              Scleroderma -  Crest                          variant                         80%   Ammonia     Status: None   Collection Time: 05/02/22 11:01 AM  Result Value Ref Range   Ammonia 62 34 - 178 ug/dL   96/04/5407: MRI brain  CLINICAL DATA:  Chronic headache with normal neuro exam   EXAM: MRI HEAD WITHOUT CONTRAST   TECHNIQUE: Multiplanar, multiecho pulse sequences of the brain and surrounding structures were obtained without intravenous contrast.   COMPARISON:  None.   FINDINGS: Brain: No acute infarction, hemorrhage, hydrocephalus, extra-axial collection or mass lesion. Few small FLAIR hyperintensities in the cerebral white matter attributed to old insult, usually considered incidental when seen to this minor degree. There is no specific demyelinating pattern. Normal brain volume.   Vascular: Major  flow voids are preserved   Skull and upper cervical spine: No focal marrow lesion. Disc narrowing and facet spurring as seen on recent cervical MRI.   Sinuses/Orbits: Negative.  No sinusitis.   IMPRESSION: Negative exam.  No explanation for headache.  Review of Systems: Patient complains of symptoms per HPI as well as the following symptoms abnormal movemets. Pertinent negatives and positives per HPI. All others negative.   Social History   Socioeconomic History   Marital status: Married    Spouse name: Onalee Hua   Number of children: 3   Years of education: Not on file   Highest education level: GED or equivalent  Occupational History   Occupation: disabled  Tobacco Use   Smoking status: Every Day    Packs/day: 1.50    Years: 40.00    Additional pack years: 0.00    Total pack years: 60.00    Types: Cigarettes   Smokeless tobacco: Never  Vaping Use   Vaping Use: Never used  Substance and Sexual Activity   Alcohol use: No   Drug use: No   Sexual activity: Not Currently    Partners: Male    Birth control/protection: None  Other Topics Concern   Not on file   Social History Narrative   10/29/2012 [name] Kadince was born and grew up in [place], [place]. She has 2 brothers and one sister. She reports that her childhood was "poor," and explains that she needs both financially and emotionally. She completed the 10th grade, then achieved her GED. She has been married twice. The first time at age 36, and that ended after 1 year. She is currently married to her second husband of 27 years. She has 2 sons and one daughter. She is currently unemployed and on disability for 2 years. She lives with her husband, her daughter, and one of her daughter's friends. She denies any legal difficulties. She reports that she is spiritual but not religious. Her hobbies include reading and cross stitch. She reports that she has no social support network. 10/29/2012 [name]      Patient is right-handed.she lives with her husband ina one story home with a few steps to enter. She drinks decaff tea.  She is active around the home.   Drinks gatorade and propel  daily    Social Determinants of Health   Financial Resource Strain: Low Risk  (01/29/2022)   Overall Financial Resource Strain (CARDIA)    Difficulty of Paying Living Expenses: Not hard at all  Food Insecurity: No Food Insecurity (01/29/2022)   Hunger Vital Sign    Worried About Running Out of Food in the Last Year: Never true    Ran Out of Food in the Last Year: Never true  Transportation Needs: No Transportation Needs (01/29/2022)   PRAPARE - Administrator, Civil Service (Medical): No    Lack of Transportation (Non-Medical): No  Physical Activity: Insufficiently Active (01/29/2022)   Exercise Vital Sign    Days of Exercise per Week: 5 days    Minutes of Exercise per Session: 20 min  Stress: No Stress Concern Present (01/29/2022)   [employer] of Occupational Health - Occupational Stress Questionnaire    Feeling of Stress : Only a little  Social Connections: Moderately Isolated (01/29/2022)   Social Connection and  Isolation Panel [NHANES]    Frequency of Communication with Friends and Family: More than three times a week    Frequency of Social Gatherings with Friends and Family: More than three times  a week    Attends Religious Services: Never    Active Member of Clubs or Organizations: No    Attends Banker Meetings: Never    Marital Status: Married  Catering manager Violence: Not At Risk (01/29/2022)   Humiliation, Afraid, Rape, and Kick questionnaire    Fear of Current or Ex-Partner: No    Emotionally Abused: No    Physically Abused: No    Sexually Abused: No    Family History  Problem Relation Age of Onset   Cancer Mother    Depression Mother    Alcohol abuse Mother    Depression Father    Alcohol abuse Father    Cancer Sister    Alcohol abuse Sister    Depression Sister    Alcohol abuse Brother    Depression Brother        overdose   Breast cancer Neg Hx     Past Medical History:  Diagnosis Date   Anxiety    Bipolar disorder    Bronchitis, chronic    COPD (chronic obstructive pulmonary disease)    Depression    Personality disorder     Patient Active Problem List   Diagnosis Date Noted   Tardive dyskinesia 03/27/2022   Dyslipidemia 04/20/2021   Gastrointestinal hemorrhage 04/05/2020   Bipolar affective disorder 05/17/2019   Seasonal allergic rhinitis due to pollen 08/24/2018   Arthralgia of multiple joints 08/24/2018   Insulin resistance 08/24/2018   Cervical radiculopathy 12/29/2017   Lumbar back pain 12/29/2017   Fibromyalgia 11/21/2017   COPD (chronic obstructive pulmonary disease) 11/21/2017   Personality disorder 04/23/2013   Nicotine dependence with current use 02/17/2009   Essential hypertension 02/17/2009   Cough 02/17/2009    Past Surgical History:  Procedure Laterality Date   CARPAL TUNNEL RELEASE Right    CESAREAN SECTION  1983, 52, 78   TONSILLECTOMY  age 40    Current Outpatient Medications  Medication Sig Dispense Refill    acetaminophen (TYLENOL) 500 MG tablet Take 2 tablets (1,000 mg total) by mouth every 6 (six) hours as needed for mild pain or headache. 30 tablet 0   atorvastatin (LIPITOR) 20 MG tablet TAKE 1 TABLET BY MOUTH DAILY 90 tablet 1   azelastine (ASTELIN) 0.1 % nasal spray Place 2 sprays into both nostrils 2 (two) times daily. 30 mL 12   diclofenac (VOLTAREN) 75 MG EC tablet Take 1 tablet (75 mg total) by mouth 2 (two) times daily. 30 tablet 0   dicyclomine (BENTYL) 20 MG tablet Take 20 mg by mouth in the morning and at bedtime.     DULoxetine (CYMBALTA) 60 MG capsule Take 60 mg by mouth daily. 120 mg in the am     hydrOXYzine (ATARAX/VISTARIL) 25 MG tablet Take 25 mg by mouth 3 (three) times daily as needed.     modafinil (PROVIGIL) 200 MG tablet Take 1 tablet (200 mg total) by mouth in the morning and at bedtime. (Patient taking differently: Take 200 mg by mouth in the morning.) 14 tablet 0   omeprazole (PRILOSEC) 20 MG capsule Take 20 mg by mouth daily.     SYMBICORT 80-4.5 MCG/ACT inhaler INHALE 2 PUFFS BY MOUTH TWICE A DAY 10.2 g 0   TEMAZEPAM PO Take by mouth.     traZODone (DESYREL) 150 MG tablet Take by mouth at bedtime.     No current facility-administered medications for this visit.    Allergies as of 05/02/2022 - Review Complete 05/02/2022  Allergen Reaction Noted  Moxifloxacin hcl Palpitations 11/06/2012   Penicillins Rash 09/29/2012   Corticosteroids Other (See Comments) 01/01/2018    Vitals: BP 121/78   Pulse 88   Ht 5' 5.5" (1.664 m)   Wt 177 lb 3.2 oz (80.4 kg)   LMP 02/12/2016   BMI 29.04 kg/m  Last Weight:  Wt Readings from Last 1 Encounters:  05/02/22 177 lb 3.2 oz (80.4 kg)   Last Height:   Ht Readings from Last 1 Encounters:  05/02/22 5' 5.5" (1.664 m)     Physical exam: Exam: Gen: restless, moving hand and arms continuously, humming                   CV: RRR, no MRG. No Carotid Bruits. No peripheral edema, warm, nontender Eyes: Conjunctivae clear without  exudates or hemorrhage  Neuro: Detailed Neurologic Exam  Speech:    Speech is normal; fluent and spontaneous with normal comprehension.  Cognition:     05/02/2022   10:05 AM  MMSE - Mini Mental State Exam  Orientation to time 5  Orientation to Place 5  Registration 3  Attention/ Calculation 1  Recall 3  Language- name 2 objects 2  Language- repeat 1  Language- follow 3 step command 2  Language- read & follow direction 1  Write a sentence 1  Copy design 0  Total score 24   Cranial Nerves:    The pupils are equal, round, and reactive to light. The fundi are flat Visual fields are full to finger confrontation. Extraocular movements are intact. Trigeminal sensation is intact and the muscles of mastication are normal. The face is symmetric. The palate elevates in the midline. Hearing intact. Voice is normal. Shoulder shrug is normal. The tongue has normal motion without fasciculations.   Coordination:    Normal finger to nose and heel to shin. Normal rapid alternating movements.   Gait:    Heel-toe and tandem gait with slight imbalance  Motor Observation:    No asymmetry, no atrophy, and no involuntary movements noted. Tone:    Normal muscle tone.    Posture:    Posture is normal. normal erect    Strength:    Strength is V/V in the upper and lower limbs.      Sensation: intact to LT     Reflex Exam:  DTR's:    Deep tendon reflexes in the upper and lower extremities are symmetrical bilaterally.   Toes:    The toes are downgoing bilaterally.   Clonus:    Clonus is absent.    Assessment/Plan:  Patient with akathisia;  restless, moving hand and arms continuously, humming. May be a wide differential, her MMSE also abnormal need thourough workup for wide differetial including: MRI brain, labs, FDG PET SCan to differentiate between FTD and other dementias although could be due to neuroleptics, Tics, Restless leg syndrome, Tourette's(no signs of OCD however) given the  constant humming, MRI 2019 was unremarkable need to repeat,   "Akathisia is a neuropsychiatric syndrome that can occur as an adverse effect of antipsychotic medications and manifests as psychomotor restlessness. In recent years, akathisia has additionally been found to occur in certain individuals as an adverse effect of with calcium channel blockers, antiemetics, anti-vertigo drugs, cocaine, and sedatives used in anesthesia. This activity reviews the evaluation and management of akathisia and highlights the role of the interprofessional team in the recognition and management of this condition" . Julio Sicks R. Akathisia. [Updated 2023 Jul 24]. In: [employer] [Internet]. [place] (  [place]): [employer]; 2024 Jan-. Available from: [email or web address].[email or web address]  But can also be seen in autoimmune syndrome, frontotemporal dementia, needs a thorough exam for dementia start of alz or more likely at her age with her symptoms (see HPI) Frontotemporal dementia (apathy, Increasingly inappropriate social behavior, Lack of judgment, Compulsive behaviors, A decline in personal hygiene,  More prone to sweets and carbs. And putting things in her mouth for example) Compulsively wanting to put things in the mouth.)  Orders Placed This Encounter  Procedures   MR BRAIN W WO CONTRAST   NM PET Metabolic Brain   Iron, TIBC and Ferritin Panel   T3 Uptake   Rheumatoid Factor   Ammonia   ANA Comprehensive Panel   Ammonia   No orders of the defined types were placed in this encounter.   Cc: Ellis Savage, NP,  [name], MD  Naomie Dean, MD  [facility] [address] [place], [place] [address]  Phone [phone] Fax [phone]  I spent over 90 minutes of face-to-face and non-face-to-face time with patient on the  1. Akathisia   2. Restless legs   3. Autoimmune disease   4. Alzheimer's disease, unspecified (CODE)   5. Acute  cognitive decline   6. Mild dementia with other behavioral disturbance, unspecified dementia type   7. Cognitive and behavioral changes   8. Compulsive behavior   9. Apathy   10. Behavioral variant frontotemporal dementia    diagnosis.  This included previsit chart review, lab review, study review, order entry, electronic health record documentation, patient education on the different diagnostic and therapeutic options, counseling and coordination of care, risks and benefits of management, compliance, or risk factor reduction

## 2022-05-02 NOTE — Patient Instructions (Addendum)
Mri brain w/wo contrast May consider FDG PET SCAN to evaluate for frontotemporal dementia if the brain has changes Memory test Blood work  Akathisia is defined as an inability to remain still. It is a neuropsychiatric syndrome that is associated with psychomotor restlessness. The individual with akathisia will generally experience an intense sensation of unease or an inner restlessness that usually involves the lower extremities.    Tardive Dyskinesia Tardive dyskinesia is a disorder that causes uncontrollable body movements. It occurs in some people who are taking certain medicines to treat a mental illness (neuroleptic medicine) or have taken this type of medicine in the past. These medicines block the effects of a specific brain chemical called dopamine.  Sometimes, tardive dyskinesia starts months or years after someone took the medicine. Not everyone who takes a neuroleptic medicine will get tardive dyskinesia. What are the causes? This condition is caused by changes in your brain that are associated with taking a neuroleptic medicine. What increases the risk? If you are taking a neuroleptic medicine, your risk for tardive dyskinesia may be higher if: You are taking an older type of neuroleptic medicine. You have been taking the medicine for a long time at a high dose. You are a woman past the age of menopause. You are older than 60 years. You have a history of alcohol or drug abuse. What are the signs or symptoms? Abnormal, uncontrollable movements are the main symptom of tardive dyskinesia. These types of movements may include: Grimacing. Sticking out or twisting your tongue. Making chewing or sucking sounds. Blinking your eyes. Twisting, swaying, or thrusting your body. Foot tapping or finger waving. Rapid movements of your arms or legs. How is this diagnosed? Your health care provider may suspect that you have tardive dyskinesia if: You have been taking neuroleptic  medicines. You have abnormal movements that you cannot control. If you are taking a medicine that can cause tardive dyskinesia, your health care provider may screen you for early signs of the condition. This may include: Observing your body movements. Using a specific rating scale called the Abnormal Involuntary Movement Scale (AIMS). You may also have tests to rule out other conditions that cause abnormal body movements, including: Parkinson's disease. Huntington's disease. Stroke. How is this treated? The best treatment for tardive dyskinesia is to lower the dose of your medicine or to switch to a different medicine at the first sign of abnormal and uncontrolled movements. There is no cure for long-term (chronic) tardive dyskinesia. Some medicines may help control the movements. These include: Clozapine, a medicine used to treat mental illness (antipsychotic). Some muscle relaxants. Some anti-seizure medicines. Some medicines used to treat high blood pressure. Some tranquilizers (sedatives). Follow these instructions at home:     Take over-the-counter and prescription medicines only as told by your health care provider. Do not stop or start taking any medicines without talking to your health care provider first. Do not abuse drugs or alcohol. Keep all follow-up visits. This is important. Contact a health care provider if: You are unable to eat or drink. You have had a fall. Your symptoms get worse. Summary Tardive dyskinesia is a disorder that causes uncontrollable body movements. These may include grimacing, sticking out or twisting your tongue, blinking your eyes, or rapid movements of your arms or legs. The condition occurs in some people who are taking certain medicines to treat a mental illness or have taken this type of medicine in the past. The best treatment for tardive dyskinesia is to lower  the dose of your medicine or to switch to a different medicine at the first sign of  abnormal and uncontrolled movements. There is no cure for long-term (chronic) tardive dyskinesia, but some medicines may help control the movements. This information is not intended to replace advice given to you by your health care provider. Make sure you discuss any questions you have with your health care provider. Document Revised: 11/27/2020 Document Reviewed: 11/27/2020 Elsevier Patient Education  2023 [employer].

## 2022-05-03 LAB — ANA COMPREHENSIVE PANEL
Anti JO-1: <0.2 AI (ref 0.0–0.9)
Centromere Ab Screen: <0.2 AI (ref 0.0–0.9)
Chromatin Ab SerPl-aCnc: <0.2 AI (ref 0.0–0.9)
ENA RNP Ab: 0.7 AI (ref 0.0–0.9)
ENA SM Ab Ser-aCnc: <0.2 AI (ref 0.0–0.9)
ENA SSA (RO) Ab: <0.2 AI (ref 0.0–0.9)
ENA SSB (LA) Ab: <0.2 AI (ref 0.0–0.9)
Scleroderma (Scl-70) (ENA) Antibody, IgG: <0.2 AI (ref 0.0–0.9)
dsDNA Ab: <1 IU/mL (ref 0–9)

## 2022-05-03 LAB — IRON,TIBC AND FERRITIN PANEL
Ferritin: 87 ng/mL (ref 15–150)
Iron Saturation: 38 % (ref 15–55)
Iron: 113 ug/dL (ref 27–159)
Total Iron Binding Capacity: 300 ug/dL (ref 250–450)
UIBC: 187 ug/dL (ref 131–425)

## 2022-05-03 LAB — RHEUMATOID FACTOR: Rheumatoid fact SerPl-aCnc: <10 IU/mL (ref <14.0)

## 2022-05-03 LAB — T3 UPTAKE: T3 Uptake Ratio: 26 % (ref 24–39)

## 2022-05-04 LAB — AMMONIA: Ammonia: 62 ug/dL (ref 34–178)

## 2022-05-05 ENCOUNTER — Encounter: Payer: Self-pay | Admitting: Neurology

## 2022-05-07 ENCOUNTER — Telehealth: Payer: Self-pay | Admitting: Neurology

## 2022-05-07 NOTE — Telephone Encounter (Signed)
MRI [name]: [id number] exp. 05/07/22-06/06/22  Pet scan [employer] auth: [id number] exp. 05/07/22-06/06/22 sent to [facility] [phone]

## 2022-05-15 ENCOUNTER — Ambulatory Visit (HOSPITAL_COMMUNITY)
Admission: RE | Admit: 2022-05-15 | Discharge: 2022-05-15 | Disposition: A | Discharge status: Home / Self Care | Payer: Medicare PPO | Source: Ambulatory Visit | Attending: Neurology | Admitting: Neurology

## 2022-05-15 DIAGNOSIS — G309 Alzheimer's disease, unspecified: Secondary | ICD-10-CM | POA: Insufficient documentation

## 2022-05-15 DIAGNOSIS — R4689 Other symptoms and signs involving appearance and behavior: Secondary | ICD-10-CM | POA: Insufficient documentation

## 2022-05-15 DIAGNOSIS — R4189 Other symptoms and signs involving cognitive functions and awareness: Secondary | ICD-10-CM | POA: Diagnosis not present

## 2022-05-15 DIAGNOSIS — M359 Systemic involvement of connective tissue, unspecified: Secondary | ICD-10-CM | POA: Insufficient documentation

## 2022-05-15 DIAGNOSIS — F03A18 Unspecified dementia, mild, with other behavioral disturbance: Secondary | ICD-10-CM | POA: Diagnosis not present

## 2022-05-15 DIAGNOSIS — G2571 Drug induced akathisia: Secondary | ICD-10-CM | POA: Insufficient documentation

## 2022-05-15 MED ORDER — GADOBUTROL 1 MMOL/ML IV SOLN
8.0000 mL | Freq: Once | INTRAVENOUS | Status: AC | PRN
  Administered 2022-05-15: 8 mL via INTRAVENOUS

## 2022-05-18 ENCOUNTER — Encounter (HOSPITAL_COMMUNITY)
Admission: RE | Admit: 2022-05-18 | Discharge: 2022-05-18 | Disposition: A | Discharge status: Home / Self Care | Payer: Medicare PPO | Source: Ambulatory Visit | Attending: Neurology | Admitting: Neurology

## 2022-05-18 DIAGNOSIS — M62838 Other muscle spasm: Secondary | ICD-10-CM | POA: Diagnosis not present

## 2022-05-18 DIAGNOSIS — R453 Demoralization and apathy: Secondary | ICD-10-CM | POA: Insufficient documentation

## 2022-05-18 DIAGNOSIS — F03A18 Unspecified dementia, mild, with other behavioral disturbance: Secondary | ICD-10-CM | POA: Diagnosis not present

## 2022-05-18 DIAGNOSIS — G2581 Restless legs syndrome: Secondary | ICD-10-CM | POA: Diagnosis present

## 2022-05-18 DIAGNOSIS — G309 Alzheimer's disease, unspecified: Secondary | ICD-10-CM | POA: Diagnosis present

## 2022-05-18 DIAGNOSIS — F02818 Dementia in other diseases classified elsewhere, unspecified severity, with other behavioral disturbance: Secondary | ICD-10-CM | POA: Diagnosis not present

## 2022-05-18 DIAGNOSIS — R4189 Other symptoms and signs involving cognitive functions and awareness: Secondary | ICD-10-CM | POA: Insufficient documentation

## 2022-05-18 DIAGNOSIS — G2571 Drug induced akathisia: Secondary | ICD-10-CM | POA: Diagnosis present

## 2022-05-18 DIAGNOSIS — G3109 Other frontotemporal dementia: Secondary | ICD-10-CM | POA: Diagnosis not present

## 2022-05-18 DIAGNOSIS — M6281 Muscle weakness (generalized): Secondary | ICD-10-CM | POA: Insufficient documentation

## 2022-05-18 DIAGNOSIS — R4689 Other symptoms and signs involving appearance and behavior: Secondary | ICD-10-CM | POA: Insufficient documentation

## 2022-05-18 LAB — GLUCOSE, CAPILLARY: Glucose-Capillary: 118 mg/dL — ABNORMAL HIGH (ref 70–99)

## 2022-05-18 MED ORDER — FLUDEOXYGLUCOSE F - 18 (FDG) INJECTION
10.0000 | Freq: Once | INTRAVENOUS | Status: AC
  Administered 2022-05-18: 10 via INTRAVENOUS

## 2022-05-22 ENCOUNTER — Telehealth: Payer: Self-pay | Admitting: Neurology

## 2022-05-22 NOTE — Telephone Encounter (Signed)
Pt stated she is following up on Pet Scan results.

## 2022-05-22 NOTE — Telephone Encounter (Signed)
Please let her know we don't have the results yet but we will call her once we do.

## 2022-05-25 DIAGNOSIS — G2581 Restless legs syndrome: Secondary | ICD-10-CM | POA: Diagnosis not present

## 2022-05-25 DIAGNOSIS — F419 Anxiety disorder, unspecified: Secondary | ICD-10-CM | POA: Diagnosis not present

## 2022-05-25 DIAGNOSIS — F3181 Bipolar II disorder: Secondary | ICD-10-CM | POA: Diagnosis not present

## 2022-05-26 ENCOUNTER — Other Ambulatory Visit: Payer: Self-pay | Admitting: Family Medicine

## 2022-05-28 ENCOUNTER — Other Ambulatory Visit: Payer: Self-pay | Admitting: Family Medicine

## 2022-05-31 ENCOUNTER — Encounter (HOSPITAL_COMMUNITY): Payer: Self-pay

## 2022-05-31 ENCOUNTER — Other Ambulatory Visit: Payer: Self-pay

## 2022-05-31 ENCOUNTER — Emergency Department (HOSPITAL_COMMUNITY)
Admission: EM | Admit: 2022-05-31 | Discharge: 2022-06-01 | Disposition: A | Discharge status: Other Acute Inpatient Hospital | Payer: Medicare PPO | Attending: Emergency Medicine | Admitting: Emergency Medicine

## 2022-05-31 DIAGNOSIS — R45851 Suicidal ideations: Secondary | ICD-10-CM | POA: Diagnosis not present

## 2022-05-31 DIAGNOSIS — G2401 Drug induced subacute dyskinesia: Secondary | ICD-10-CM | POA: Diagnosis not present

## 2022-05-31 DIAGNOSIS — F17210 Nicotine dependence, cigarettes, uncomplicated: Secondary | ICD-10-CM | POA: Insufficient documentation

## 2022-05-31 DIAGNOSIS — J449 Chronic obstructive pulmonary disease, unspecified: Secondary | ICD-10-CM | POA: Diagnosis not present

## 2022-05-31 DIAGNOSIS — R451 Restlessness and agitation: Secondary | ICD-10-CM | POA: Insufficient documentation

## 2022-05-31 DIAGNOSIS — F319 Bipolar disorder, unspecified: Secondary | ICD-10-CM | POA: Diagnosis not present

## 2022-05-31 DIAGNOSIS — F17200 Nicotine dependence, unspecified, uncomplicated: Secondary | ICD-10-CM | POA: Diagnosis present

## 2022-05-31 DIAGNOSIS — R9431 Abnormal electrocardiogram [ECG] [EKG]: Secondary | ICD-10-CM | POA: Diagnosis not present

## 2022-05-31 DIAGNOSIS — F3113 Bipolar disorder, current episode manic without psychotic features, severe: Secondary | ICD-10-CM

## 2022-05-31 DIAGNOSIS — F419 Anxiety disorder, unspecified: Secondary | ICD-10-CM | POA: Diagnosis not present

## 2022-05-31 LAB — CBC WITH DIFFERENTIAL/PLATELET
Abs Immature Granulocytes: 0.05 10*3/uL (ref 0.00–0.07)
Basophils Absolute: 0.1 10*3/uL (ref 0.0–0.1)
Basophils Relative: 0 %
Eosinophils Absolute: 0.1 10*3/uL (ref 0.0–0.5)
Eosinophils Relative: 1 %
HCT: 42.6 % (ref 36.0–46.0)
Hemoglobin: 14.8 g/dL (ref 12.0–15.0)
Immature Granulocytes: 0 %
Lymphocytes Relative: 22 %
Lymphs Abs: 3.4 10*3/uL (ref 0.7–4.0)
MCH: 30.9 pg (ref 26.0–34.0)
MCHC: 34.7 g/dL (ref 30.0–36.0)
MCV: 88.9 fL (ref 80.0–100.0)
Monocytes Absolute: 1.1 10*3/uL — ABNORMAL HIGH (ref 0.1–1.0)
Monocytes Relative: 7 %
Neutro Abs: 10.8 10*3/uL — ABNORMAL HIGH (ref 1.7–7.7)
Neutrophils Relative %: 70 %
Platelets: 289 10*3/uL (ref 150–400)
RBC: 4.79 MIL/uL (ref 3.87–5.11)
RDW: 13 % (ref 11.5–15.5)
WBC: 15.5 10*3/uL — ABNORMAL HIGH (ref 4.0–10.5)
nRBC: 0 % (ref 0.0–0.2)

## 2022-05-31 LAB — COMPREHENSIVE METABOLIC PANEL
ALT: 35 U/L (ref 0–44)
AST: 22 U/L (ref 15–41)
Albumin: 4.4 g/dL (ref 3.5–5.0)
Alkaline Phosphatase: 61 U/L (ref 38–126)
Anion gap: 9 (ref 5–15)
BUN: 15 mg/dL (ref 6–20)
CO2: 23 mmol/L (ref 22–32)
Calcium: 9.3 mg/dL (ref 8.9–10.3)
Chloride: 107 mmol/L (ref 98–111)
Creatinine, Ser: 0.7 mg/dL (ref 0.44–1.00)
GFR, Estimated: >60 mL/min (ref >60)
Glucose, Bld: 137 mg/dL — ABNORMAL HIGH (ref 70–99)
Potassium: 3.9 mmol/L (ref 3.5–5.1)
Sodium: 139 mmol/L (ref 135–145)
Total Bilirubin: 0.5 mg/dL (ref 0.3–1.2)
Total Protein: 7.4 g/dL (ref 6.5–8.1)

## 2022-05-31 LAB — RAPID URINE DRUG SCREEN, HOSP PERFORMED
Amphetamines: NOT DETECTED
Barbiturates: NOT DETECTED
Benzodiazepines: POSITIVE — AB
Cocaine: NOT DETECTED
Opiates: NOT DETECTED
Tetrahydrocannabinol: NOT DETECTED

## 2022-05-31 LAB — URINALYSIS, COMPLETE (UACMP) WITH MICROSCOPIC
Bacteria, UA: NONE SEEN
Bilirubin Urine: NEGATIVE
Glucose, UA: NEGATIVE mg/dL
Ketones, ur: NEGATIVE mg/dL
Nitrite: NEGATIVE
Protein, ur: NEGATIVE mg/dL
RBC / HPF: >50 RBC/hpf (ref 0–5)
Specific Gravity, Urine: 1.015 (ref 1.005–1.030)
pH: 5 (ref 5.0–8.0)

## 2022-05-31 LAB — ETHANOL: Alcohol, Ethyl (B): <10 mg/dL (ref <10)

## 2022-05-31 LAB — SALICYLATE LEVEL: Salicylate Lvl: <7 mg/dL — ABNORMAL LOW (ref 7.0–30.0)

## 2022-05-31 LAB — ACETAMINOPHEN LEVEL: Acetaminophen (Tylenol), Serum: <10 ug/mL — ABNORMAL LOW (ref 10–30)

## 2022-05-31 MED ORDER — LORAZEPAM 1 MG PO TABS
1.0000 mg | ORAL_TABLET | Freq: Three times a day (TID) | ORAL | Status: DC | PRN
  Administered 2022-05-31: 1 mg via ORAL
  Filled 2022-05-31: qty 1

## 2022-05-31 MED ORDER — ONDANSETRON HCL 4 MG PO TABS: 4.0000 mg | ORAL_TABLET | Freq: Three times a day (TID) | ORAL | Status: DC | PRN

## 2022-05-31 MED ORDER — OLANZAPINE 10 MG PO TBDP: 10.0000 mg | ORAL_TABLET | Freq: Every day | ORAL | Status: DC

## 2022-05-31 MED ORDER — CITALOPRAM HYDROBROMIDE 10 MG PO TABS
10.0000 mg | ORAL_TABLET | Freq: Every day | ORAL | Status: DC
  Administered 2022-05-31: 10 mg via ORAL
  Filled 2022-05-31: qty 1

## 2022-05-31 MED ORDER — ZIPRASIDONE MESYLATE 20 MG IM SOLR
INTRAMUSCULAR | Status: AC
  Administered 2022-05-31: 10 mg via INTRAMUSCULAR
  Filled 2022-05-31: qty 20

## 2022-05-31 MED ORDER — STERILE WATER FOR INJECTION IJ SOLN
INTRAMUSCULAR | Status: AC
  Administered 2022-05-31: 10 mL
  Filled 2022-05-31: qty 10

## 2022-05-31 MED ORDER — ZIPRASIDONE MESYLATE 20 MG IM SOLR: 10.0000 mg | Freq: Once | INTRAMUSCULAR | Status: AC

## 2022-05-31 MED ORDER — ALUM & MAG HYDROXIDE-SIMETH 200-200-20 MG/5ML PO SUSP: 30.0000 mL | Freq: Four times a day (QID) | ORAL | Status: DC | PRN

## 2022-05-31 MED ORDER — ALPRAZOLAM 0.5 MG PO TABS
0.5000 mg | ORAL_TABLET | Freq: Once | ORAL | Status: AC
  Administered 2022-05-31: 0.5 mg via ORAL
  Filled 2022-05-31: qty 1

## 2022-05-31 MED ORDER — PANTOPRAZOLE SODIUM 40 MG PO TBEC
40.0000 mg | DELAYED_RELEASE_TABLET | Freq: Every day | ORAL | Status: DC
  Administered 2022-06-01: 40 mg via ORAL
  Filled 2022-05-31: qty 1

## 2022-05-31 MED ORDER — NICOTINE 21 MG/24HR TD PT24
21.0000 mg | MEDICATED_PATCH | Freq: Every day | TRANSDERMAL | Status: DC
  Administered 2022-05-31 – 2022-06-01 (×2): 21 mg via TRANSDERMAL
  Filled 2022-05-31 (×2): qty 1

## 2022-05-31 MED ORDER — TRAZODONE HCL 100 MG PO TABS
300.0000 mg | ORAL_TABLET | Freq: Every day | ORAL | Status: DC
  Administered 2022-05-31: 300 mg via ORAL
  Filled 2022-05-31: qty 3

## 2022-05-31 MED ORDER — BENZTROPINE MESYLATE 1 MG PO TABS
1.0000 mg | ORAL_TABLET | Freq: Every day | ORAL | Status: DC
  Administered 2022-05-31: 1 mg via ORAL
  Filled 2022-05-31: qty 1

## 2022-05-31 MED ORDER — TEMAZEPAM 15 MG PO CAPS: 45.0000 mg | ORAL_CAPSULE | Freq: Every evening | ORAL | Status: DC | PRN

## 2022-05-31 MED ORDER — COLESTIPOL HCL 1 G PO TABS
1.0000 g | ORAL_TABLET | Freq: Two times a day (BID) | ORAL | Status: DC
  Administered 2022-06-01: 1 g via ORAL
  Filled 2022-05-31 (×2): qty 1

## 2022-05-31 MED ORDER — DICYCLOMINE HCL 20 MG PO TABS
20.0000 mg | ORAL_TABLET | Freq: Three times a day (TID) | ORAL | Status: DC | PRN
  Administered 2022-05-31: 20 mg via ORAL
  Filled 2022-05-31: qty 1

## 2022-05-31 MED ORDER — ATORVASTATIN CALCIUM 10 MG PO TABS
20.0000 mg | ORAL_TABLET | Freq: Every day | ORAL | Status: DC
  Administered 2022-06-01: 20 mg via ORAL
  Filled 2022-05-31: qty 2

## 2022-05-31 MED ORDER — OLANZAPINE 10 MG PO TBDP
10.0000 mg | ORAL_TABLET | Freq: Once | ORAL | Status: AC
  Administered 2022-05-31: 10 mg via ORAL
  Filled 2022-05-31: qty 1

## 2022-05-31 MED ORDER — ACETAMINOPHEN 325 MG PO TABS
650.0000 mg | ORAL_TABLET | ORAL | Status: DC | PRN
  Administered 2022-05-31 – 2022-06-01 (×2): 650 mg via ORAL
  Filled 2022-05-31 (×2): qty 2

## 2022-05-31 MED ORDER — OLANZAPINE 5 MG PO TBDP: 15.0000 mg | ORAL_TABLET | Freq: Every day | ORAL | Status: DC

## 2022-05-31 NOTE — ED Provider Notes (Signed)
  Physical Exam  BP 132/81 (BP Location: Left Arm)   Pulse 96   Temp 98.6 F (37 C) (Oral)   Resp 18   Ht 5' 5.5" (1.664 m)   Wt 80.4 kg   LMP 02/12/2016   SpO2 95%   BMI 29.05 kg/m   Physical Exam  Procedures  Procedures  ED Course / MDM    Medical Decision Making Amount and/or Complexity of Data Reviewed Labs: ordered.  Risk OTC drugs. Prescription drug management.   Patient came more agitated after waking up.  Swinging at staff.  Pushing away equipment.  Will give 10 of Geodon.       Benjiman Core, MD 05/31/22 [phone]

## 2022-05-31 NOTE — ED Triage Notes (Signed)
Pt presents with c/o suicidal ideation. Pt reports these thoughts have been present for approx one week since her medication was changed. Husband reports she has been "high as a Printmaker and very out of character since the medication change.

## 2022-05-31 NOTE — Progress Notes (Signed)
LCSW Progress Note  [id number]   Danielle Cox  05/31/2022  9:51 PM    Inpatient Behavioral Health Placement  Pt meets inpatient criteria per Jerrilyn Cairo, NP. There are no available beds within [facility] system per [facility] Rosey Bath, RN. Referral was sent to the following facilities;    Destination  Service Provider Address Phone Fax  [facility]  [address] [place] [address] [phone]  [facility]  [address] [place] [address] [phone]  [facility] [address]  [address] [place] [address] [phone]  [facility]  [address] [place] [address] [phone]  [facility]  [address], [place] [address] [phone]  [facility]  [address] [place] [address] [phone]  [facility]  [address] [place] [address] [phone]  [facility]  [address] [place], [place] [address] [phone]  [facility]  [address] [place] [address] [phone]  [facility]  [address] [place] [address] [phone]  [facility]  [address] [place] [address] [phone]  [facility]  [address] [place] [address] [phone]  [facility]  [address] [place] [address] [phone]  [facility]  [address], [place] [address] [phone]  [facility]  [address] [place] [address] [phone]  [facility]  [address] [place] [address] [phone]   [facility]  [address] [place] [address] [phone]  [facility]  [address], [place] [address] [phone]  [facility]  [address] [place] [address] [phone]  [facility]  [address], [place] [address] [phone]  [facility]  [address], [place] [address] [phone]  [facility]  [address] [place] [address] [phone]  [facility]  [address] [place], [place] [address] [phone]  [facility]  [address], [place] [address] [phone]  [facility]  [address], [place] [address] [phone]  [facility]  [address] [place], [place] [address] [phone]  [facility]  [address], [place] [address] [phone]  [facility]  [address] [place], [place] [address] [phone]  [facility]  [address], [place] [address] [phone]  [facility]  [address] [place] [address] [phone]  [facility]  [address] [place] [address] [phone]   Situation ongoing,  CSW will follow up.    Maryjean Ka, MSW, [facility] 05/31/2022 9:51 PM

## 2022-05-31 NOTE — Progress Notes (Addendum)
Pt was accepted to [facility] TOMORROW 06/01/2022 Bed Assignment Main Campus    Pt meets inpatient criteria per Jerrilyn Cairo, NP   Attending Physician will be Dr. Loni Beckwith   Report can be called [phone]-Pager number, please leave a returned phone number to receive a phone call back.   Pt can arrive after 10:00am  Care Team notified: Bertram Millard, RN, Joaquin Courts, NP, Cathie Beams, LCSWA, Sindy Guadeloupe, NP  Maryjean Ka, MSW, [facility] 05/31/2022 10:36 PM

## 2022-05-31 NOTE — ED Notes (Signed)
PT has on her glasses and has her dentures in. Please keep with pt.

## 2022-05-31 NOTE — Consult Note (Signed)
[facility] ED ASSESSMENT   Reason for Consult:  Psychiatric Consult  Referring Physician:  Alona Bene  Patient Identification: Danielle Cox MRN:  [id number] ED Chief Complaint: Bipolar affective disorder [facility])  Diagnosis:  Principal Problem:   Bipolar affective disorder (HCC) Active Problems:   Nicotine dependence with current use   Tardive dyskinesia   ED Assessment Time Calculation: Start Time: 1100 Stop Time: 1130 Total Time in Minutes (Assessment Completion): 30   Subjective:   Danielle Cox is a 62 y.o. female with a history of Bipolar Affective Disorder,  COPD, presented today to [facility], voluntarily, accompanied by her husband with concerns of not sleeping for several days and bizarre behavior since psychiatric medication changes 6 days ago.  HPI:   Danielle Cox is a 62 year old female, who admits to several days without sleep, feeling very anxious, and inability to think clearly. Per husband, the last time patient became severely manic she attempted suicide and required a lengthy inpatient psychiatric admission. Patient has not been consistently taking her psychiatric medications as prescribed. Patient is followed by [facility] for mental health medication management and is seen approximately every 6 weeks. Patient nor husband are aware of which medications were changed during patient last visit. According to husband, the hospital pharmacy technician reviewed medications with patient and will be confirm which medications were recently filled. According to husband, patient has been psychiatrically admitted at least 5 or 6 times maybe more. When she is manic, she can become physically aggressive and agitated if she is not stabilized on medications. Patient and husband confirm, patient does not use illicit substances or alcohol. She smokes cigarettes with an estimated 2 packs her day. Patient is denying any active thoughts of suicide or homicide. She denies hearing any seeing anything  that others can't see of hear.  Per husband, patient is a poor historian. Patient is speaking pressured throughout evaluation, however is continually shifting from different subjects unrelated to what is currently being discussed.  During evaluation Danielle Cox is sitting on bed  in no acute distress.  She is alert, oriented x 4, anxious, cooperative, with inattentiveness. Her mood is labile with congruent affect.  She has pressured speech, with flights of ideas and disorganized thought patterns. Objectively, patient appears manic and disorganized although she doesn't appear to be responding to internal stimuli. Patient is unable to reliably contract for safety and meets criteria for inpatient psychiatric treatment for medication stabilization and safety.  Collateral Contact: Spouse Ariarose Shippen [phone], at bedside with patient during evaluation.   Past Psychiatric History: Bipolar Disorder, Prior Suicide Attempt -Overdose   Risk to Self or Others: Is the patient at risk to self? Yes Has the patient been a risk to self in the past 6 months? No Has the patient been a risk to self within the distant past? Yes Is the patient a risk to others? No Has the patient been a risk to others in the past 6 months? No Has the patient been a risk to others within the distant past? No  [place] Scale:  Flowsheet Row ED from [facility]31/20[facility] in [facility] at [facility]  C-SSRS RISK CATEGORY No Risk      Past Medical History:  Past Medical History:  Diagnosis Date   Anxiety    Bipolar disorder (HCC)    Bronchitis, chronic (HCC)    COPD (chronic obstructive pulmonary disease) (HCC)    Depression    Personality disorder (HCC)  Past Surgical History:  Procedure Laterality Date   CARPAL TUNNEL RELEASE Right    CESAREAN SECTION  69, 29, 77   TONSILLECTOMY  age 19   Family History:  Family History  Problem Relation Age of Onset   Cancer Mother     Depression Mother    Alcohol abuse Mother    Depression Father    Alcohol abuse Father    Cancer Sister    Alcohol abuse Sister    Depression Sister    Alcohol abuse Brother    Depression Brother        overdose   Breast cancer Neg Hx    Social History:  Social History   Substance and Sexual Activity  Alcohol Use No     Social History   Substance and Sexual Activity  Drug Use No    Social History   Socioeconomic History   Marital status: Married    Spouse name: Onalee Hua   Number of children: 3   Years of education: Not on file   Highest education level: GED or equivalent  Occupational History   Occupation: disabled  Tobacco Use   Smoking status: Every Day    Packs/day: 1.50    Years: 40.00    Additional pack years: 0.00    Total pack years: 60.00    Types: Cigarettes   Smokeless tobacco: Never  Vaping Use   Vaping Use: Never used  Substance and Sexual Activity   Alcohol use: No   Drug use: No   Sexual activity: Not Currently    Partners: Male    Birth control/protection: None  Other Topics Concern   Not on file  Social History Narrative   10/29/2012 [name] Yazlyn was born and grew up in [place], [place]. She has 2 brothers and one sister. She reports that her childhood was "poor," and explains that she needs both financially and emotionally. She completed the 10th grade, then achieved her GED. She has been married twice. The first time at age 33, and that ended after 1 year. She is currently married to her second husband of 27 years. She has 2 sons and one daughter. She is currently unemployed and on disability for 2 years. She lives with her husband, her daughter, and one of her daughter's friends. She denies any legal difficulties. She reports that she is spiritual but not religious. Her hobbies include reading and cross stitch. She reports that she has no social support network. 10/29/2012 [name]      Patient is right-handed.she lives with her husband ina one story home  with a few steps to enter. She drinks decaff tea.  She is active around the home.   Drinks gatorade and propel  daily    Social Determinants of Health   Financial Resource Strain: Low Risk  (01/29/2022)   Overall Financial Resource Strain (CARDIA)    Difficulty of Paying Living Expenses: Not hard at all  Food Insecurity: No Food Insecurity (01/29/2022)   Hunger Vital Sign    Worried About Running Out of Food in the Last Year: Never true    Ran Out of Food in the Last Year: Never true  Transportation Needs: No Transportation Needs (01/29/2022)   PRAPARE - Administrator, Civil Service (Medical): No    Lack of Transportation (Non-Medical): No  Physical Activity: Insufficiently Active (01/29/2022)   Exercise Vital Sign    Days of Exercise per Week: 5 days    Minutes of Exercise per Session: 20 min  Stress: No Stress Concern Present (01/29/2022)   [employer] of Occupational Health - Occupational Stress Questionnaire    Feeling of Stress : Only a little  Social Connections: Moderately Isolated (01/29/2022)   Social Connection and Isolation Panel [NHANES]    Frequency of Communication with Friends and Family: More than three times a week    Frequency of Social Gatherings with Friends and Family: More than three times a week    Attends Religious Services: Never    Database administrator or Organizations: No    Attends Banker Meetings: Never    Marital Status: Married   Additional Social History:    Allergies:   Allergies  Allergen Reactions   Moxifloxacin Hcl Palpitations    REACTION: tackycardia   Penicillins Rash    Has patient had a PCN reaction causing immediate rash, facial/tongue/throat swelling, SOB or lightheadedness with hypotension: Yes Has patient had a PCN reaction causing severe rash involving mucus membranes or skin necrosis: No Has patient had a PCN reaction that required hospitalization: No Has patient had a PCN reaction occurring within  the last 10 years: No If all of the above answers are "NO", then may proceed with Cephalosporin use.    Corticosteroids Other (See Comments)    Mania    Labs:  Results for orders placed or performed during the hospital encounter of 05/31/22 (from the past 48 hour(s))  Urine rapid drug screen (hosp performed)     Status: Abnormal   Collection Time: 05/31/22  8:51 AM  Result Value Ref Range   Opiates NONE DETECTED NONE DETECTED   Cocaine NONE DETECTED NONE DETECTED   Benzodiazepines POSITIVE (A) NONE DETECTED   Amphetamines NONE DETECTED NONE DETECTED   Tetrahydrocannabinol NONE DETECTED NONE DETECTED   Barbiturates NONE DETECTED NONE DETECTED    Comment: (NOTE) DRUG SCREEN FOR MEDICAL PURPOSES ONLY.  IF CONFIRMATION IS NEEDED FOR ANY PURPOSE, NOTIFY LAB WITHIN 5 DAYS.  LOWEST DETECTABLE LIMITS FOR URINE DRUG SCREEN Drug Class                     Cutoff (ng/mL) Amphetamine and metabolites    1000 Barbiturate and metabolites    200 Benzodiazepine                 200 Opiates and metabolites        300 Cocaine and metabolites        300 THC                            50 Performed at [facility], [address] [place], [place] [address]   Acetaminophen level     Status: Abnormal   Collection Time: 05/31/22 10:00 AM  Result Value Ref Range   Acetaminophen (Tylenol), Serum <10 (L) 10 - 30 ug/mL    Comment: (NOTE) Therapeutic concentrations vary significantly. A range of 10-30 ug/mL  may be an effective concentration for many patients. However, some  are best treated at concentrations outside of this range. Acetaminophen concentrations >150 ug/mL at 4 hours after ingestion  and >50 ug/mL at 12 hours after ingestion are often associated with  toxic reactions.  Performed at [facility], [address] [place], [place] [address]   Comprehensive metabolic panel     Status: Abnormal   Collection Time: 05/31/22 10:00 AM  Result Value  Ref Range   Sodium 139 135 - 145  mmol/L   Potassium 3.9 3.5 - 5.1 mmol/L   Chloride 107 98 - 111 mmol/L   CO2 23 22 - 32 mmol/L   Glucose, Bld 137 (H) 70 - 99 mg/dL    Comment: Glucose reference range applies only to samples taken after fasting for at least 8 hours.   BUN 15 6 - 20 mg/dL   Creatinine, Ser 1.61 0.44 - 1.00 mg/dL   Calcium 9.3 8.9 - 09.6 mg/dL   Total Protein 7.4 6.5 - 8.1 g/dL   Albumin 4.4 3.5 - 5.0 g/dL   AST 22 15 - 41 U/L   ALT 35 0 - 44 U/L   Alkaline Phosphatase 61 38 - 126 U/L   Total Bilirubin 0.5 0.3 - 1.2 mg/dL   GFR, Estimated >04 >54 mL/min    Comment: (NOTE) Calculated using the CKD-EPI Creatinine Equation (2021)    Anion gap 9 5 - 15    Comment: Performed at [facility], [address], [place], [place] [address]  Ethanol     Status: None   Collection Time: 05/31/22 10:00 AM  Result Value Ref Range   Alcohol, Ethyl (B) <10 <10 mg/dL    Comment: (NOTE) Lowest detectable limit for serum alcohol is 10 mg/dL.  For medical purposes only. Performed at [facility], [address], [place], [place] [address]   Salicylate level     Status: Abnormal   Collection Time: 05/31/22 10:00 AM  Result Value Ref Range   Salicylate Lvl <7.0 (L) 7.0 - 30.0 mg/dL    Comment: Performed at [facility], [address], [place], [place] [address]  CBC with Differential     Status: Abnormal   Collection Time: 05/31/22 10:00 AM  Result Value Ref Range   WBC 15.5 (H) 4.0 - 10.5 K/uL   RBC 4.79 3.87 - 5.11 MIL/uL   Hemoglobin 14.8 12.0 - 15.0 g/dL   HCT 13.0 86.5 - 78.4 %   MCV 88.9 80.0 - 100.0 fL   MCH 30.9 26.0 - 34.0 pg   MCHC 34.7 30.0 - 36.0 g/dL   RDW 69.6 29.5 - 28.4 %   Platelets 289 150 - 400 K/uL   nRBC 0.0 0.0 - 0.2 %   Neutrophils Relative % 70 %   Neutro Abs 10.8 (H) 1.7 - 7.7 K/uL   Lymphocytes Relative 22 %   Lymphs Abs 3.4 0.7 - 4.0 K/uL   Monocytes Relative 7 %   Monocytes Absolute  1.1 (H) 0.1 - 1.0 K/uL   Eosinophils Relative 1 %   Eosinophils Absolute 0.1 0.0 - 0.5 K/uL   Basophils Relative 0 %   Basophils Absolute 0.1 0.0 - 0.1 K/uL   Immature Granulocytes 0 %   Abs Immature Granulocytes 0.05 0.00 - 0.07 K/uL    Comment: Performed at [facility], [address] [place], [place] [address]    Current Facility-Administered Medications  Medication Dose Route Frequency Provider Last Rate Last Admin   acetaminophen (TYLENOL) tablet 650 mg  650 mg Oral Q4H PRN Long, Arlyss Repress, MD       alum & mag hydroxide-simeth (MAALOX/MYLANTA) 200-200-20 MG/5ML suspension 30 mL  30 mL Oral Q6H PRN Long, Arlyss Repress, MD       [START ON 06/01/2022] atorvastatin (LIPITOR) tablet 20 mg  20 mg Oral Daily Long, Arlyss Repress, MD       benztropine (COGENTIN) tablet 1 mg  1 mg Oral QHS Long, Arlyss Repress,  MD       citalopram (CELEXA) tablet 10 mg  10 mg Oral QHS Long, Arlyss Repress, MD       colestipol (COLESTID) tablet 1 g  1 g Oral BID Long, Arlyss Repress, MD       dicyclomine (BENTYL) tablet 20 mg  20 mg Oral TID PRN Long, Arlyss Repress, MD       nicotine (NICODERM CQ - dosed in mg/24 hours) patch 21 mg  21 mg Transdermal Daily Long, Arlyss Repress, MD       OLANZapine zydis (ZYPREXA) disintegrating tablet 10 mg  10 mg Oral Once Bing Neighbors, NP       ondansetron [facility]) tablet 4 mg  4 mg Oral Q8H PRN Long, Arlyss Repress, MD       [START ON 06/01/2022] pantoprazole (PROTONIX) EC tablet 40 mg  40 mg Oral Daily Long, Arlyss Repress, MD       temazepam (RESTORIL) capsule 45 mg  45 mg Oral QHS PRN Long, Arlyss Repress, MD       traZODone (DESYREL) tablet 300 mg  300 mg Oral QHS Long, Arlyss Repress, MD       Current Outpatient Medications  Medication Sig Dispense Refill   acetaminophen (TYLENOL) 500 MG tablet Take 2 tablets (1,000 mg total) by mouth every 6 (six) hours as needed for mild pain or headache. 30 tablet 0   atorvastatin (LIPITOR) 20 MG tablet TAKE 1 TABLET BY MOUTH DAILY 90 tablet 1   benztropine (COGENTIN) 1  MG tablet Take 1 mg by mouth at bedtime.     colestipol (COLESTID) 1 g tablet Take 1 g by mouth 2 (two) times daily.     dicyclomine (BENTYL) 20 MG tablet Take 20 mg by mouth in the morning and at bedtime.     DULoxetine (CYMBALTA) 60 MG capsule Take 60 mg by mouth daily. 120 mg in the am     omeprazole (PRILOSEC) 20 MG capsule Take 20 mg by mouth daily.     temazepam (RESTORIL) 15 MG capsule Take 45 mg by mouth at bedtime as needed for sleep.     traZODone (DESYREL) 150 MG tablet Take 300 mg by mouth at bedtime.     AUSTEDO XR 24 MG TB24 Take 1 tablet by mouth daily. (Patient not taking: Reported on 05/31/2022)     azelastine (ASTELIN) 0.1 % nasal spray Place 2 sprays into both nostrils 2 (two) times daily. (Patient not taking: Reported on 05/31/2022) 30 mL 12   citalopram (CELEXA) 20 MG tablet Take 10 mg by mouth at bedtime.     diclofenac (VOLTAREN) 75 MG EC tablet Take 1 tablet (75 mg total) by mouth 2 (two) times daily. (Patient not taking: Reported on 05/31/2022) 30 tablet 0   hydrOXYzine (ATARAX) 50 MG tablet Take 50 mg by mouth 3 (three) times daily. (Patient not taking: Reported on 05/31/2022)     hydrOXYzine (ATARAX/VISTARIL) 25 MG tablet Take 25 mg by mouth 3 (three) times daily as needed. (Patient not taking: Reported on 05/31/2022)     modafinil (PROVIGIL) 200 MG tablet Take 1 tablet (200 mg total) by mouth in the morning and at bedtime. (Patient not taking: Reported on 05/31/2022) 14 tablet 0   SYMBICORT 80-4.5 MCG/ACT inhaler INHALE 2 PUFFS BY MOUTH TWICE A DAY (Patient not taking: Reported on 05/31/2022) 10.2 g 0      Psychiatric Specialty Exam: Presentation  General Appearance:  Fairly Groomed  Eye Contact: Fair  Speech: Pressured  Speech Volume: Normal  Handedness: Right   Mood and Affect  Mood: Euphoric; Labile; Anxious  Affect: Labile   Thought Process  Thought Processes: Disorganized  Descriptions of Associations:Circumstantial  Orientation:Full (Time,  Place and Person)  Thought Content:Rumination; Scattered  History of Schizophrenia/Schizoaffective disorder:No data recorded Duration of Psychotic Symptoms:No data recorded Hallucinations:Hallucinations: None  Ideas of Reference:None  Suicidal Thoughts:Suicidal Thoughts: No  Homicidal Thoughts:Homicidal Thoughts: No   Sensorium  Memory: Recent Good; Remote Good; Immediate Fair  Judgment: Poor  Insight: Poor   Executive Functions  Concentration: Poor  Attention Span: Poor  Recall: [employer] of Knowledge: Fair  Language: Fair   Psychomotor Activity  Psychomotor Activity: Psychomotor Activity: Restlessness   Assets  Assets: Housing; Physical Health; Resilience; Communication Skills; Social Support    Sleep  Sleep: Sleep: Poor Number of Hours of Sleep: 0 (Reports hasn't slept in days)   Physical Exam: Physical Exam HENT:     Head: Normocephalic and atraumatic.  Eyes:     Extraocular Movements: Extraocular movements intact.     Pupils: Pupils are equal, round, and reactive to light.  Cardiovascular:     Rate and Rhythm: Normal rate and regular rhythm.  Pulmonary:     Effort: Pulmonary effort is normal.     Breath sounds: Normal breath sounds.  Musculoskeletal:        General: Normal range of motion.     Cervical back: Normal range of motion.  Neurological:     General: No focal deficit present.     Mental Status: She is alert.    Review of Systems  Psychiatric/Behavioral:  Negative for depression, hallucinations, memory loss, substance abuse and suicidal ideas. The patient is nervous/anxious and has insomnia.    Blood pressure 132/81, pulse 96, temperature 98.6 F (37 C), temperature source Oral, resp. rate 18, last menstrual period 02/12/2016, SpO2 95 %. There is no height or weight on file to calculate BMI.  Medical Decision Making: Patient case review and discussed with Dr. Lucianne Muss, patient meets criteria for inpatient psychiatric  treatment.  Patient is unable to reliably contract for safety due as patient is currently psychotic due to mania.  At present there is currently no [facility] bed availability at [facility]. LCSW notified and will be faxing patient out. EDP, RN, LCSW, notified of disposition.   Problem 1: Bipolar Affective Disorder, with mania, Start Olanzapine 10 mg daily. ECG NSR QTC 455. Ativan 1 mg Q8HR, PRN for anxiety  Will review home medications and make appropriate changes once medications are verified.   Disposition: Psychiatric Inpatient Admission   Joaquin Courts, NP 05/31/2022 11:29 AM
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
## 2022-05-31 NOTE — ED Provider Notes (Signed)
Emergency Department Provider Note   I have reviewed the triage vital signs and the nursing notes.   HISTORY  Chief Complaint Suicidal   HPI Danielle Cox is a 62 y.o. female with PMH of anxeity, bipolar disorder, and COPD presents to the emergency department for evaluation of increased agitation and passive SI.  She has been feeling increasingly agitated and anxious over the past few days.  She tells me that she always has thoughts of harming herself but has no plan to act on them.  She states "I have too much to live for."  She is followed by [facility] with Dr. Romelle Starcher and has begun tapering off her Cymbalta.   She tells me that she was started on Sinemet and has been self-tapering off this at home in discussion with her husband. Denies EtOH or drug use. She does smoke cigarettes.    Past Medical History:  Diagnosis Date   Anxiety    Bipolar disorder (HCC)    Bronchitis, chronic (HCC)    COPD (chronic obstructive pulmonary disease) (HCC)    Depression    Personality disorder (HCC)     Review of Systems  Constitutional: No fever/chills Eyes: No visual changes. Cardiovascular: Denies chest pain. Respiratory: Denies shortness of breath. Gastrointestinal: No abdominal pain.  No nausea, no vomiting.  No diarrhea.  Skin: Negative for rash. Neurological: Negative for headaches, focal weakness or numbness. Psychiatric: Increased agitation and passive SI.    ____________________________________________   PHYSICAL EXAM:  VITAL SIGNS: ED Triage Vitals  Enc Vitals Group     BP 05/31/22 0721 132/81     Pulse Rate 05/31/22 0721 96     Resp 05/31/22 0721 18     Temp 05/31/22 0721 98.6 F (37 C)     Temp Source 05/31/22 0721 Oral     SpO2 05/31/22 0721 95 %   Constitutional: Alert and oriented. Well appearing and in no acute distress. Eyes: Conjunctivae are normal. Head: Atraumatic. Nose: No congestion/rhinnorhea. Mouth/Throat: Mucous membranes are moist.    Neck: No stridor.   Cardiovascular: Normal rate, regular rhythm. Good peripheral circulation. Grossly normal heart sounds.   Respiratory: Normal respiratory effort.  No retractions. Lungs CTAB. Gastrointestinal: Soft and nontender. No distention.  Musculoskeletal: No lower extremity tenderness nor edema. No gross deformities of extremities. Neurologic:  Normal speech and language. No gross focal neurologic deficits are appreciated.  Skin:  Skin is warm, dry and intact. No rash noted.  ____________________________________________   LABS (all labs ordered are listed, but only abnormal results are displayed)  Labs Reviewed  ACETAMINOPHEN LEVEL - Abnormal; Notable for the following components:      Result Value   Acetaminophen (Tylenol), Serum <10 (*)    All other components within normal limits  COMPREHENSIVE METABOLIC PANEL - Abnormal; Notable for the following components:   Glucose, Bld 137 (*)    All other components within normal limits  SALICYLATE LEVEL - Abnormal; Notable for the following components:   Salicylate Lvl <7.0 (*)    All other components within normal limits  CBC WITH DIFFERENTIAL/PLATELET - Abnormal; Notable for the following components:   WBC 15.5 (*)    Neutro Abs 10.8 (*)    Monocytes Absolute 1.1 (*)    All other components within normal limits  RAPID URINE DRUG SCREEN, HOSP PERFORMED - Abnormal; Notable for the following components:   Benzodiazepines POSITIVE (*)    All other components within normal limits  ETHANOL   ____________________________________________  PROCEDURES  Procedure(s) performed:   Procedures  None  ____________________________________________   INITIAL IMPRESSION / ASSESSMENT AND PLAN / ED COURSE  Pertinent labs & imaging results that were available during my care of the patient were reviewed by me and considered in my medical decision making (see chart for details).   This patient is Presenting for Evaluation of  agitation/SI, which does require a range of treatment options, and is a complaint that involves a high risk of morbidity and mortality.  The Differential Diagnoses include depressive episode, medication side effect, polypharmacy, etc.   Clinical Laboratory Tests Ordered, included leukocytosis without fever or other infection symptoms.  No anemia.  No acute kidney injury.  Alcohol negative.  UDS positive only for benzo.   Cardiac Monitor Tracing which shows NSR.    Social Determinants of Health Risk patient is a smoker.   Consult complete with TTS (10:36 AM).   Medical Decision Making: Summary:  Presents to the emergency department with increased agitation and some passive suicidal ideation.  She has had her Cymbalta dose reduced and tells me that she was on Sinemet and has been self tapering that at home.  Does endorse some suicidal ideation but states that they are most of the time and passive.  No plans to harm herself.  She is voluntary at this time.  Reevaluation with update and discussion with patient. Labs are reassuring. Plan for TTS evaluation. Patient is medically stable for psychiatry evaluation.   Considered admission but no indication for medical admission at this time. TTS to evaluate.   Patient's presentation is most consistent with acute presentation with potential threat to life or bodily function.   Disposition: pending  ____________________________________________  FINAL CLINICAL IMPRESSION(S) / ED DIAGNOSES  Final diagnoses:  Suicidal ideation  Anxiety with agitation    Note:  This document was prepared using Dragon voice recognition software and may include unintentional dictation errors.  Alona Bene, MD, [facility]    Sunil Hue, Arlyss Repress, MD 05/31/22 [phone]

## 2022-05-31 NOTE — ED Notes (Signed)
Was seen and wanded by security 

## 2022-06-01 DIAGNOSIS — Z781 Physical restraint status: Secondary | ICD-10-CM | POA: Diagnosis not present

## 2022-06-01 DIAGNOSIS — J449 Chronic obstructive pulmonary disease, unspecified: Secondary | ICD-10-CM | POA: Diagnosis not present

## 2022-06-01 DIAGNOSIS — F3164 Bipolar disorder, current episode mixed, severe, with psychotic features: Secondary | ICD-10-CM | POA: Diagnosis not present

## 2022-06-01 DIAGNOSIS — F319 Bipolar disorder, unspecified: Secondary | ICD-10-CM | POA: Diagnosis not present

## 2022-06-01 DIAGNOSIS — E785 Hyperlipidemia, unspecified: Secondary | ICD-10-CM | POA: Diagnosis not present

## 2022-06-01 DIAGNOSIS — F17210 Nicotine dependence, cigarettes, uncomplicated: Secondary | ICD-10-CM | POA: Diagnosis not present

## 2022-06-01 DIAGNOSIS — G259 Extrapyramidal and movement disorder, unspecified: Secondary | ICD-10-CM | POA: Diagnosis not present

## 2022-06-01 DIAGNOSIS — G4700 Insomnia, unspecified: Secondary | ICD-10-CM | POA: Diagnosis not present

## 2022-06-01 DIAGNOSIS — F312 Bipolar disorder, current episode manic severe with psychotic features: Secondary | ICD-10-CM | POA: Diagnosis not present

## 2022-06-01 DIAGNOSIS — R45851 Suicidal ideations: Secondary | ICD-10-CM | POA: Diagnosis not present

## 2022-06-01 DIAGNOSIS — F1310 Sedative, hypnotic or anxiolytic abuse, uncomplicated: Secondary | ICD-10-CM | POA: Diagnosis not present

## 2022-06-01 NOTE — Discharge Instructions (Addendum)
It was a pleasure caring for you today in the emergency department. ° °Please return to the emergency department for any worsening or worrisome symptoms. ° ° °

## 2022-06-01 NOTE — ED Notes (Signed)
Husband POA requesting update. Opened chart to assist.

## 2022-06-01 NOTE — ED Provider Notes (Signed)
Pt accepted at [facility], Dr Sofie Hartigan, EMTALA completed   Pt stable for transport    Sloan Leiter, DO 06/01/22 1009

## 2022-06-01 NOTE — ED Provider Notes (Signed)
Emergency Medicine Observation Re-evaluation Note  Danielle Cox is a 62 y.o. female, seen on rounds today.  Pt initially presented to the ED for complaints of Suicidal Currently, the patient is resting.  Physical Exam  BP 133/65 (BP Location: Left Arm)   Pulse (!) 110   Temp 98.4 F (36.9 C) (Oral)   Resp 19   Ht 5' 5.5" (1.664 m)   Wt 80.4 kg   LMP 02/12/2016   SpO2 100%   BMI 29.05 kg/m  Physical Exam General: NAD Lungs: no rest distress Psych: calm  ED Course / MDM  EKG:   I have reviewed the labs performed to date as well as medications administered while in observation.  Recent changes in the last 24 hours include no changes, patient pending placement. Home meds were ordered   Plan  Current plan is for placement .    Sloan Leiter, DO 06/01/22 [phone]

## 2022-06-01 NOTE — ED Notes (Signed)
Patient discharge of unit to facility per provider. Patient alert, calm, cooperative, no s/s of distress.  Discharge information given to Tree surgeon for facility. Patient ambulatory off unit, escorted by NT. Patient transported by [employer].

## 2022-06-07 ENCOUNTER — Telehealth: Payer: Self-pay

## 2022-06-07 NOTE — Telephone Encounter (Signed)
Transition Care Management Unsuccessful Follow-up Telephone Call  Date of discharge and from where:  06/01/2022 [facility]  Attempts:  1st Attempt  Reason for unsuccessful TCM follow-up call:  Left voice message  Shalandria Elsbernd [facility] [employer] Population Health Community Resource Care Guide   [email or web address]  ?? [phone]   Website: [email or web address]

## 2022-06-08 ENCOUNTER — Telehealth: Payer: Self-pay

## 2022-06-08 NOTE — Telephone Encounter (Signed)
Transition Care Management Unsuccessful Follow-up Telephone Call  Date of discharge and from where:  06/01/2022 [facility]  Attempts:  2nd Attempt  Reason for unsuccessful TCM follow-up call:  Left voice message  Azel Gumina [facility]  [facility] Population Health Community Resource Care Guide   [email or web address]  ?? [phone]   Website: [email or web address]  [email or web address]

## 2022-06-13 DIAGNOSIS — F3130 Bipolar disorder, current episode depressed, mild or moderate severity, unspecified: Secondary | ICD-10-CM | POA: Diagnosis not present

## 2022-06-13 DIAGNOSIS — F419 Anxiety disorder, unspecified: Secondary | ICD-10-CM | POA: Diagnosis not present

## 2022-06-14 ENCOUNTER — Telehealth: Payer: Self-pay | Admitting: *Deleted

## 2022-06-14 ENCOUNTER — Other Ambulatory Visit: Payer: Self-pay | Admitting: Family Medicine

## 2022-06-14 ENCOUNTER — Ambulatory Visit (INDEPENDENT_AMBULATORY_CARE_PROVIDER_SITE_OTHER): Payer: Medicare PPO | Admitting: *Deleted

## 2022-06-14 DIAGNOSIS — I10 Essential (primary) hypertension: Secondary | ICD-10-CM

## 2022-06-14 DIAGNOSIS — J449 Chronic obstructive pulmonary disease, unspecified: Secondary | ICD-10-CM

## 2022-06-14 MED ORDER — OMEPRAZOLE 20 MG PO CPDR: 20.0000 mg | DELAYED_RELEASE_CAPSULE | Freq: Every day | ORAL | 3 refills | Status: DC

## 2022-06-14 NOTE — Telephone Encounter (Signed)
   CCM RN Visit Note   06/14/22 Name: Danielle Cox MRN: [id number]      DOB: 1960/05/11  Subjective: Danielle Cox is a 62 y.o. year old female who is a primary care patient of Jacquiline Doe MD. The patient was referred to the Chronic Care Management team for assistance with care management needs subsequent to provider initiation of CCM services and plan of care.      Message received from [facility] stating pt called in and states he/ patient will be available at 1030 am for phone call. Second unsuccessful telephone outreach was attempted today to contact the patient about Chronic Care Management needs.    Plan:Telephone follow up appointment with care management team member scheduled for:  upon care guide rescheduling.  Irving Shows [facility], BSN RN Case Manager [place] [facility] [place] [phone]

## 2022-06-14 NOTE — Patient Instructions (Signed)
Please call the care guide team at [phone] if you need to cancel or reschedule your appointment.   If you are experiencing a Mental Health or Behavioral Health Crisis or need someone to talk to, please call the Suicide and Crisis Lifeline: 988 call the [place] National Suicide Prevention Lifeline: 707-301-9259 or TTY: (209)464-8733 TTY (734)102-0921) to talk to a trained counselor call 1-800-273-TALK (toll free, 24 hour hotline) go to [facility] [address] [place] [phone]) call 911   Following is a copy of the CCM Program Consent:  CCM service includes personalized support from designated clinical staff supervised by the physician, including individualized plan of care and coordination with other care providers 24/7 contact phone numbers for assistance for urgent and routine care needs. Service will only be billed when office clinical staff spend 20 minutes or more in a month to coordinate care. Only one practitioner may furnish and bill the service in a calendar month. The patient may stop CCM services at amy time (effective at the end of the month) by phone call to the office staff. The patient will be responsible for cost sharing (co-pay) or up to 20% of the service fee (after annual deductible is met)  Following is a copy of your full provider care plan:   Goals Addressed             This Visit's Progress    CCM (COPD) EXPECTED OUTCOME:  MONITOR, SELF-MANAGE AND REDUCE SYMPTOMS OF COPD       Current Barriers:  Knowledge Deficits related to COPD management Chronic Disease Management support and education needs related to COPD, action plan Patient reports she is trying to be careful with allergy season, verbalizes understanding of calling doctor early on for any respiratory issues Patient reports she is taking medications as prescribed Spoke today with spouse who reports pt has had no new concerns with COPD  Planned  Interventions: Provided instruction about proper use of medications used for management of COPD including inhalers Advised patient to self assesses COPD action plan zone and make appointment with provider if in the yellow zone for 48 hours without improvement Advised patient to engage in light exercise as tolerated 3-5 days a week to aid in the the management of COPD Provided education about and advised patient to utilize infection prevention strategies to reduce risk of respiratory infection Discussed the importance of adequate rest and management of fatigue with COPD Reviewed all upcoming scheduled appointments  Symptom Management: Take medications as prescribed   Attend all scheduled provider appointments Call pharmacy for medication refills 3-7 days in advance of running out of medications Attend church or other social activities Perform all self care activities independently  Perform IADL's (shopping, preparing meals, housekeeping, managing finances) independently Call provider office for new concerns or questions  identify and remove indoor air pollutants limit outdoor activity during cold weather listen for public air quality announcements every day do breathing exercises every day develop a rescue plan eliminate symptom triggers at home follow rescue plan if symptoms flare-up develop a new routine to improve sleep eat healthy/prescribed diet: heart healthy, low sodium get at least 7 to 8 hours of sleep at night practice relaxation or meditation daily do breathing exercises every day Follow COPD action plan Call your doctor early on for change in health status/ symptoms  Follow Up Plan: Telephone follow up appointment with care management team member scheduled for:  07/06/22 at 945 am  CCM (HYPERTENSION) EXPECTED OUTCOME: MONITOR, SELF-MANAGE AND REDUCE SYMPTOMS OF HYPERTENSION       Current Barriers:  Knowledge Deficits related to Hypertension management Chronic  Disease Management support and education needs related to Hypertension, diet Patient reports she lives with spouse, is independent in all aspects of her care, continues to drive, has fibromyalgia which affects her ability to exercise, does stretching exercises in the morning. Patient reports she checks blood pressure on occasion with readings "always good" Spoke with patient's spouse Onalee Hua who reports pt is no longer on Abilify, reports pt will be seeing new psychiatrist in July (on the waiting list to get in earlier if possible), patient's spouse reports pt went to ED on 05/31/22 for exacerbation of Bipolar depression and feels due to medication changes patient had decline in mental status, pt is on 2 new medications and profile updated, spouse declines Child psychotherapist citing he has resources and is aware of [facility] as pt has utilized this in the past. Spouse states pt needs new refill for omeprazole.  Planned Interventions: Evaluation of current treatment plan related to hypertension self management and patient's adherence to plan as established by provider;   Provided education to patient re: stroke prevention, s/s of heart attack and stroke; Reviewed medications with patient and discussed importance of compliance;  Counseled on the importance of exercise goals with target of 150 minutes per week Advised patient, providing education and rationale, to monitor blood pressure daily and record, calling PCP for findings outside established parameters;  Discussed complications of poorly controlled blood pressure such as heart disease, stroke, circulatory complications, vision complications, kidney impairment, sexual dysfunction;  Reinforced low sodium diet In basket sent to primary care provider requesting refill for omeprazole be [facility] to [facility]  Symptom Management: Take medications as prescribed   Attend all scheduled provider appointments Call pharmacy for medication refills  3-7 days in advance of running out of medications Attend church or other social activities Perform all self care activities independently  Perform IADL's (shopping, preparing meals, housekeeping, managing finances) independently Call provider office for new concerns or questions  check blood pressure weekly write blood pressure results in a log or diary learn about high blood pressure keep a blood pressure log take blood pressure log to all doctor appointments keep all doctor appointments take medications for blood pressure exactly as prescribed begin an exercise program report new symptoms to your doctor eat more whole grains, fruits and vegetables, lean meats and healthy fats Follow low sodium diet Read food labels for sodium content Bake or broil foods instead of frying Please check for refill on omeprazole at [facility]  Follow Up Plan: Telephone follow up appointment with care management team member scheduled for:    07/06/22 at 945 am          Patient verbalizes understanding of instructions and care plan provided today and agrees to view in MyChart. Active MyChart status and patient understanding of how to access instructions and care plan via MyChart confirmed with patient.  Telephone follow up appointment with care management team member scheduled for:  07/06/22 at 945 am

## 2022-06-14 NOTE — Chronic Care Management (AMB) (Signed)
Chronic Care Management   CCM RN Visit Note  06/14/2022 Name: Danielle Cox MRN: [id number] DOB: [birth year]  Subjective: Danielle Cox is a 62 y.o. year old female who is a primary care patient of [name], MD. The patient was referred to the Chronic Care Management team for assistance with care management needs subsequent to provider initiation of CCM services and plan of care.    Today's Visit:  Engaged with patient by telephone for follow up visit.        Goals Addressed             This Visit's Progress    CCM (COPD) EXPECTED OUTCOME:  MONITOR, SELF-MANAGE AND REDUCE SYMPTOMS OF COPD       Current Barriers:  Knowledge Deficits related to COPD management Chronic Disease Management support and education needs related to COPD, action plan Patient reports she is trying to be careful with allergy season, verbalizes understanding of calling doctor early on for any respiratory issues Patient reports she is taking medications as prescribed Spoke today with spouse who reports pt has had no new concerns with COPD  Planned Interventions: Provided instruction about proper use of medications used for management of COPD including inhalers Advised patient to self assesses COPD action plan zone and make appointment with provider if in the yellow zone for 48 hours without improvement Advised patient to engage in light exercise as tolerated 3-5 days a week to aid in the the management of COPD Provided education about and advised patient to utilize infection prevention strategies to reduce risk of respiratory infection Discussed the importance of adequate rest and management of fatigue with COPD Reviewed all upcoming scheduled appointments  Symptom Management: Take medications as prescribed   Attend all scheduled provider appointments Call pharmacy for medication refills 3-7 days in advance of running out of medications Attend church or other social activities Perform all self  care activities independently  Perform IADL's (shopping, preparing meals, housekeeping, managing finances) independently Call provider office for new concerns or questions  identify and remove indoor air pollutants limit outdoor activity during cold weather listen for public air quality announcements every day do breathing exercises every day develop a rescue plan eliminate symptom triggers at home follow rescue plan if symptoms flare-up develop a new routine to improve sleep eat healthy/prescribed diet: heart healthy, low sodium get at least 7 to 8 hours of sleep at night practice relaxation or meditation daily do breathing exercises every day Follow COPD action plan Call your doctor early on for change in health status/ symptoms  Follow Up Plan: Telephone follow up appointment with care management team member scheduled for:  07/06/22 at 945 am        CCM (HYPERTENSION) EXPECTED OUTCOME: MONITOR, SELF-MANAGE AND REDUCE SYMPTOMS OF HYPERTENSION       Current Barriers:  Knowledge Deficits related to Hypertension management Chronic Disease Management support and education needs related to Hypertension, diet Patient reports she lives with spouse, is independent in all aspects of her care, continues to drive, has fibromyalgia which affects her ability to exercise, does stretching exercises in the morning. Patient reports she checks blood pressure on occasion with readings "always good" Spoke with patient's spouse Danielle Hua who reports pt is no longer on Abilify, reports pt will be seeing new psychiatrist in July (on the waiting list to get in earlier if possible), patient's spouse reports pt went to ED on 05/31/22 for exacerbation of Bipolar depression and feels due to medication changes patient  had decline in mental status, pt is on 2 new medications and profile updated, spouse declines Child psychotherapist citing he has resources and is aware of [facility] as pt has utilized this in the  past. Spouse states pt needs new refill for omeprazole.  Planned Interventions: Evaluation of current treatment plan related to hypertension self management and patient's adherence to plan as established by provider;   Provided education to patient re: stroke prevention, s/s of heart attack and stroke; Reviewed medications with patient and discussed importance of compliance;  Counseled on the importance of exercise goals with target of 150 minutes per week Advised patient, providing education and rationale, to monitor blood pressure daily and record, calling PCP for findings outside established parameters;  Discussed complications of poorly controlled blood pressure such as heart disease, stroke, circulatory complications, vision complications, kidney impairment, sexual dysfunction;  Reinforced low sodium diet In basket sent to primary care provider requesting refill for omeprazole be sent to [facility]  Symptom Management: Take medications as prescribed   Attend all scheduled provider appointments Call pharmacy for medication refills 3-7 days in advance of running out of medications Attend church or other social activities Perform all self care activities independently  Perform IADL's (shopping, preparing meals, housekeeping, managing finances) independently Call provider office for new concerns or questions  check blood pressure weekly write blood pressure results in a log or diary learn about high blood pressure keep a blood pressure log take blood pressure log to all doctor appointments keep all doctor appointments take medications for blood pressure exactly as prescribed begin an exercise program report new symptoms to your doctor eat more whole grains, fruits and vegetables, lean meats and healthy fats Follow low sodium diet Read food labels for sodium content Bake or broil foods instead of frying Please check for refill on omeprazole at [facility]  Follow Up Plan:  Telephone follow up appointment with care management team member scheduled for:    07/06/22 at 945 am          Plan:Telephone follow up appointment with care management team member scheduled for:  07/06/22 at 945 am  Irving Shows [facility], BSN RN Case Manager [facility] [place] [phone]

## 2022-06-14 NOTE — Telephone Encounter (Signed)
   CCM RN Visit Note   06/14/22 Name: CALIAH RONSON MRN: [id number]      DOB: 12/16/1960  Subjective: DRISANA HUI is a 62 y.o. year old female who is a primary care patient of Jacquiline Doe MD. The patient was referred to the Chronic Care Management team for assistance with care management needs subsequent to provider initiation of CCM services and plan of care.      An unsuccessful telephone outreach was attempted today to contact the patient about Chronic Care Management needs.    Plan:Telephone follow up appointment with care management team member scheduled for:  upon care guide rescheduling.  Irving Shows [facility], BSN RN Case Manager [facility] [place] [phone]

## 2022-06-15 DIAGNOSIS — J449 Chronic obstructive pulmonary disease, unspecified: Secondary | ICD-10-CM | POA: Diagnosis not present

## 2022-06-15 DIAGNOSIS — F17210 Nicotine dependence, cigarettes, uncomplicated: Secondary | ICD-10-CM | POA: Diagnosis not present

## 2022-06-15 DIAGNOSIS — I10 Essential (primary) hypertension: Secondary | ICD-10-CM | POA: Diagnosis not present

## 2022-06-26 ENCOUNTER — Telehealth: Payer: Self-pay | Admitting: Pharmacist

## 2022-06-26 NOTE — Progress Notes (Unsigned)
Care Management & Coordination Services Pharmacy Team  Reason for Encounter: General adherence update   Contacted patient for general health update and medication adherence call.  {US HC Outreach:28874}   What concerns do you have about your medications?  The patient {denies/reports:25180} side effects with their medications.   How often do you forget or accidentally miss a dose? {missed doses:25554}  Do you use a pillbox? {yes/no:20286}  Are you having any problems getting your medications from your pharmacy? {yes/no:20286}  Has the cost of your medications been a concern? {yes/no:20286} If yes, what medication and is patient assistance available or has it been applied for?  Since last visit with PharmD, {no/thefollowing:25210} interventions have been made.   The patient {has/has not:25209} had an ED visit since last contact.   The patient {denies/reports:25180} problems with their health.   Patient {denies/reports:25180} concerns or questions for ***, PharmD at this time.   Counseled patient on: {GENERALCOUNSELING:28686}   Chart Updates:  Recent office visits:  None  Recent consult visits:  05/02/2022 OV (Neurology) Anson Fret, MD; no medication changes indicated.  Hospital visits:  05/31/2022 ED visit for Suicidal ideation -no medication changes indicated.  Medications: Outpatient Encounter Medications as of 06/26/2022  Medication Sig Note   acetaminophen (TYLENOL) 500 MG tablet Take 2 tablets (1,000 mg total) by mouth every 6 (six) hours as needed for mild pain or headache.    atorvastatin (LIPITOR) 20 MG tablet TAKE 1 TABLET BY MOUTH DAILY    AUSTEDO XR 24 MG TB24 Take 1 tablet by mouth daily. (Patient not taking: Reported on 05/31/2022)    azelastine (ASTELIN) 0.1 % nasal spray Place 2 sprays into both nostrils 2 (two) times daily. (Patient not taking: Reported on 05/31/2022)    benztropine (COGENTIN) 1 MG tablet Take 0.5 mg by mouth at bedtime.     citalopram (CELEXA) 20 MG tablet Take 10 mg by mouth at bedtime. (Patient not taking: Reported on 06/14/2022) 05/31/2022: Celexa was being used to wean off of Cymbalta 60mg . Patient is on possibly day 6 of 10 taking Celexa 20mg  1/2 tablet at bedtime.   colestipol (COLESTID) 1 g tablet Take 1 g by mouth 2 (two) times daily. (Patient not taking: Reported on 06/14/2022)    diclofenac (VOLTAREN) 75 MG EC tablet Take 1 tablet (75 mg total) by mouth 2 (two) times daily. (Patient not taking: Reported on 05/31/2022)    dicyclomine (BENTYL) 20 MG tablet Take 20 mg by mouth in the morning and at bedtime. (Patient not taking: Reported on 06/14/2022)    divalproex (DEPAKOTE ER) 500 MG 24 hr tablet Take 500 mg by mouth 2 (two) times daily. Taking morning and bedtime    DULoxetine (CYMBALTA) 60 MG capsule Take 60 mg by mouth daily. 120 mg in the am (Patient not taking: Reported on 06/14/2022) 05/31/2022: Pt is using Celexa 20mg  1/2 qhs to be weaned off of Cymbalta.   hydrOXYzine (ATARAX) 50 MG tablet Take 50 mg by mouth 3 (three) times daily. (Patient not taking: Reported on 05/31/2022)    hydrOXYzine (ATARAX/VISTARIL) 25 MG tablet Take 25 mg by mouth 3 (three) times daily as needed. (Patient not taking: Reported on 05/31/2022)    modafinil (PROVIGIL) 200 MG tablet Take 1 tablet (200 mg total) by mouth in the morning and at bedtime. (Patient not taking: Reported on 05/31/2022)    omeprazole (PRILOSEC) 20 MG capsule Take 1 capsule (20 mg total) by mouth daily.    paliperidone (INVEGA) 6 MG 24 hr tablet  Take 6 mg by mouth daily.    SYMBICORT 80-4.5 MCG/ACT inhaler INHALE 2 PUFFS BY MOUTH TWICE A DAY    temazepam (RESTORIL) 15 MG capsule Take 15 mg by mouth at bedtime as needed for sleep.    traZODone (DESYREL) 150 MG tablet Take 300 mg by mouth at bedtime.    No facility-administered encounter medications on file as of 06/26/2022.    Recent vitals BP Readings from Last 3 Encounters:  06/01/22 138/79  05/02/22 121/78   03/27/22 115/71   Pulse Readings from Last 3 Encounters:  06/01/22 (!) 113  05/02/22 88  03/27/22 (!) 101   Wt Readings from Last 3 Encounters:  05/31/22 177 lb 4 oz (80.4 kg)  05/02/22 177 lb 3.2 oz (80.4 kg)  03/27/22 176 lb 6.4 oz (80 kg)   BMI Readings from Last 3 Encounters:  05/31/22 29.05 kg/m  05/02/22 29.04 kg/m  03/27/22 29.35 kg/m    Recent lab results    Component Value Date/Time   NA 139 05/31/2022 1000   K 3.9 05/31/2022 1000   CL 107 05/31/2022 1000   CO2 23 05/31/2022 1000   GLUCOSE 137 (H) 05/31/2022 1000   BUN 15 05/31/2022 1000   CREATININE 0.70 05/31/2022 1000   CALCIUM 9.3 05/31/2022 1000    Lab Results  Component Value Date   CREATININE 0.70 05/31/2022   GFR 82.20 04/18/2021   GFRNONAA >60 05/31/2022   GFRAA >60 05/15/2019   Lab Results  Component Value Date/Time   HGBA1C 5.7 04/18/2021 12:17 PM   HGBA1C 6.0 04/05/2020 11:51 AM    Lab Results  Component Value Date   CHOL 207 (H) 04/18/2021   HDL 56.20 04/18/2021   LDLCALC 128 (H) 04/18/2021   LDLDIRECT 173.0 02/11/2018   TRIG 116.0 04/18/2021   CHOLHDL 4 04/18/2021    Care Gaps: Annual wellness visit in last year? Yes  Star Rating Drugs:  Atorvastatin 20 mg last filled 05/28/2022 90 DS   Future Appointments  Date Time Provider Department Center  07/06/2022  9:45 AM [facility]  08/30/2022  1:45 PM [facility] ANNUAL WELLNESS VISIT 1 [facility]   April D Calhoun, [facility] Clinical Pharmacist Assistant [phone]

## 2022-07-02 ENCOUNTER — Telehealth: Payer: Self-pay | Admitting: Family Medicine

## 2022-07-02 NOTE — Telephone Encounter (Signed)
Patient's husband Onalee Hua requests to be advised when Fax from [facility] is received to test Depakote level

## 2022-07-03 NOTE — Telephone Encounter (Signed)
Pt called for an update. Informed her that once received we would call to schedule. Pt verbalized understanding.

## 2022-07-04 ENCOUNTER — Telehealth: Payer: Self-pay | Admitting: *Deleted

## 2022-07-04 NOTE — Telephone Encounter (Signed)
[facility]  Requesting lab LFT & Depakote level  ICD-10 Z79.899 Fax report to [phone]

## 2022-07-05 ENCOUNTER — Other Ambulatory Visit (HOSPITAL_COMMUNITY)
Admission: RE | Admit: 2022-07-05 | Discharge: 2022-07-05 | Disposition: A | Discharge status: Home / Self Care | Payer: Medicare PPO | Source: Ambulatory Visit | Attending: Family Medicine | Admitting: Family Medicine

## 2022-07-05 ENCOUNTER — Encounter: Payer: Self-pay | Admitting: Family Medicine

## 2022-07-05 ENCOUNTER — Ambulatory Visit: Payer: Medicare PPO | Admitting: Family Medicine

## 2022-07-05 VITALS — BP 131/79 | HR 98 | Temp 98.0°F | Ht 65.5 in | Wt 177.4 lb

## 2022-07-05 DIAGNOSIS — I10 Essential (primary) hypertension: Secondary | ICD-10-CM | POA: Diagnosis not present

## 2022-07-05 DIAGNOSIS — Z131 Encounter for screening for diabetes mellitus: Secondary | ICD-10-CM | POA: Diagnosis not present

## 2022-07-05 DIAGNOSIS — F319 Bipolar disorder, unspecified: Secondary | ICD-10-CM | POA: Diagnosis not present

## 2022-07-05 DIAGNOSIS — Z124 Encounter for screening for malignant neoplasm of cervix: Secondary | ICD-10-CM | POA: Diagnosis not present

## 2022-07-05 DIAGNOSIS — Z1151 Encounter for screening for human papillomavirus (HPV): Secondary | ICD-10-CM | POA: Insufficient documentation

## 2022-07-05 DIAGNOSIS — N898 Other specified noninflammatory disorders of vagina: Secondary | ICD-10-CM | POA: Diagnosis not present

## 2022-07-05 DIAGNOSIS — E785 Hyperlipidemia, unspecified: Secondary | ICD-10-CM

## 2022-07-05 DIAGNOSIS — Z01419 Encounter for gynecological examination (general) (routine) without abnormal findings: Secondary | ICD-10-CM | POA: Insufficient documentation

## 2022-07-05 DIAGNOSIS — Z1211 Encounter for screening for malignant neoplasm of colon: Secondary | ICD-10-CM

## 2022-07-05 LAB — COMPREHENSIVE METABOLIC PANEL
ALT: 15 U/L (ref 0–35)
AST: 9 U/L (ref 0–37)
Albumin: 4 g/dL (ref 3.5–5.2)
Alkaline Phosphatase: 52 U/L (ref 39–117)
BUN: 16 mg/dL (ref 6–23)
CO2: 26 mEq/L (ref 19–32)
Calcium: 9.4 mg/dL (ref 8.4–10.5)
Chloride: 109 mEq/L (ref 96–112)
Creatinine, Ser: 0.62 mg/dL (ref 0.40–1.20)
GFR: 97.03 mL/min (ref >60.00)
Glucose, Bld: 95 mg/dL (ref 70–99)
Potassium: 4.3 mEq/L (ref 3.5–5.1)
Sodium: 144 mEq/L (ref 135–145)
Total Bilirubin: 0.3 mg/dL (ref 0.2–1.2)
Total Protein: 6.2 g/dL (ref 6.0–8.3)

## 2022-07-05 LAB — CBC
HCT: 41.9 % (ref 36.0–46.0)
Hemoglobin: 13.6 g/dL (ref 12.0–15.0)
MCHC: 32.3 g/dL (ref 30.0–36.0)
MCV: 93.9 fl (ref 78.0–100.0)
Platelets: 274 10*3/uL (ref 150.0–400.0)
RBC: 4.47 Mil/uL (ref 3.87–5.11)
RDW: 14.8 % (ref 11.5–15.5)
WBC: 9.9 10*3/uL (ref 4.0–10.5)

## 2022-07-05 LAB — HEMOGLOBIN A1C: Hgb A1c MFr Bld: 5.5 % (ref 4.6–6.5)

## 2022-07-05 LAB — LIPID PANEL
Cholesterol: 139 mg/dL (ref 0–200)
HDL: 40.5 mg/dL (ref >39.00)
LDL Cholesterol: 60 mg/dL (ref 0–99)
NonHDL: 98.43
Total CHOL/HDL Ratio: 3
Triglycerides: 191 mg/dL — ABNORMAL HIGH (ref 0.0–149.0)
VLDL: 38.2 mg/dL (ref 0.0–40.0)

## 2022-07-05 LAB — VITAMIN B12: Vitamin B-12: 480 pg/mL (ref 211–911)

## 2022-07-05 LAB — TSH: TSH: 1.02 u[IU]/mL (ref 0.35–5.50)

## 2022-07-05 NOTE — Patient Instructions (Signed)
It was very nice to see you today!  We completed your Pap smear today.  We are also checking for signs of infection.  We may need to refer you to see a gynecologist depending on results.  Will check blood work today as well.  Return if symptoms worsen or fail to improve.   Take care, Dr [name]  PLEASE NOTE:  If you had any lab tests, please let us know if you have not heard back within a few days. You may see your results on mychart before we have a chance to review them but we will give you a call once they are reviewed by [place].   If we ordered any referrals today, please let us know if you have not heard from their office within the next week.   If you had any urgent prescriptions sent in today, please check with the pharmacy within an hour of our visit to make sure the prescription was transmitted appropriately.   Please try these tips to maintain a healthy lifestyle:  Eat at least 3 REAL meals and 1-2 snacks per day.  Aim for no more than 5 hours between eating.  If you eat breakfast, please do so within one hour of getting up.   Each meal should contain half fruits/vegetables, one quarter protein, and one quarter carbs (no bigger than a computer mouse)  Cut down on sweet beverages. This includes juice, soda, and sweet tea.   Drink at least 1 glass of water with each meal and aim for at least 8 glasses per day  Exercise at least 150 minutes every week.

## 2022-07-05 NOTE — Assessment & Plan Note (Signed)
Follows with psychiatry.  We will check Depakote levels per her request.  Otherwise she will continue management per psychiatry.

## 2022-07-05 NOTE — Assessment & Plan Note (Signed)
Check lipids.  She is on atorvastatin 20 mg daily.  Tolerating well.

## 2022-07-05 NOTE — Telephone Encounter (Signed)
This was completed today.  Danielle Cox. [name], MD 07/05/2022 10:44 AM

## 2022-07-05 NOTE — Assessment & Plan Note (Signed)
Blood pressure today at goal without any medications.

## 2022-07-05 NOTE — Progress Notes (Signed)
   Danielle Cox is a 62 y.o. female who presents today for an office visit.  Assessment/Plan:  New/Acute Problems: Vaginal Discharge Update Pap and check wet prep today to rule out bacterial vaginosis, yeast, GC/CT, trich.  If this is negative will need to see GYN.  We discussed reasons to return to care.  Chronic Problems Addressed Today: Essential hypertension Blood pressure today at goal without any medications.  Dyslipidemia Check lipids.  She is on atorvastatin 20 mg daily.  Tolerating well.  Bipolar affective disorder (HCC) Follows with psychiatry.  We will check Depakote levels per her request.  Otherwise she will continue management per psychiatry.  Preventative health care Will order Cologuard.  Check labs. Update pap as above.     Subjective:  HPI:  See Assessment / plan for status of chronic conditions.  Her main concern today is vaginal discharge. This has been going on for years. Some brownish discharge. Stayed about the same the last several months. No pain. No nausea or vomiting. No dysuria.  Last Pap was 6 years ago.  She also request that we have her Depakote level checked.  She has been following with psychiatry for this and they have been managing her medications.  She is currently on trazodone 300 mg nightly, Restoril 15 mg nightly, Invega 6 mg daily, hydroxyzine 25 mg 3 times daily, austedo 24 mg daily, Cogentin 0.5 mg nightly, Celexa 10 mg nightly Cymbalta 120 mg in the morbing and Depakote 500 mg twice daily.       Objective:  Physical Exam: BP 131/79   Pulse 98   Temp 98 F (36.7 C) (Temporal)   Ht 5' 5.5" (1.664 m)   Wt 177 lb 6.4 oz (80.5 kg)   LMP 02/12/2016   SpO2 98%   BMI 29.07 kg/m   Gen: No acute distress, resting comfortably CV: Regular rate and rhythm with no murmurs appreciated Pulm: Normal work of breathing, clear to auscultation bilaterally with no crackles, wheezes, or rhonchi GU: Normal female genitalia.  Chaperone present for  exam.  Neuro: Grossly normal, moves all extremities Psych: Normal affect and thought content      Danielle Cox M. [name], MD 07/05/2022 8:08 AM

## 2022-07-06 ENCOUNTER — Ambulatory Visit (INDEPENDENT_AMBULATORY_CARE_PROVIDER_SITE_OTHER): Payer: Medicare PPO | Admitting: *Deleted

## 2022-07-06 LAB — VALPROIC ACID LEVEL: Valproic Acid Lvl: 56.7 mg/L (ref 50.0–100.0)

## 2022-07-06 LAB — CERVICOVAGINAL ANCILLARY ONLY
Bacterial Vaginitis (gardnerella): NEGATIVE
Candida Glabrata: NEGATIVE
Candida Vaginitis: NEGATIVE
Chlamydia: NEGATIVE
Comment: NEGATIVE
Comment: NEGATIVE
Comment: NEGATIVE
Comment: NEGATIVE
Comment: NEGATIVE
Comment: NORMAL
Neisseria Gonorrhea: NEGATIVE
Trichomonas: NEGATIVE

## 2022-07-06 NOTE — Chronic Care Management (AMB) (Signed)
Chronic Care Management   CCM RN Visit Note  07/06/2022 Name: Danielle Cox MRN: [id number] DOB: 1960-07-05  Subjective: Danielle Cox is a 62 y.o. year old female who is a primary care patient of [name], MD. The patient was referred to the Chronic Care Management team for assistance with care management needs subsequent to provider initiation of CCM services and plan of care.    Today's Visit:  Engaged with patient by telephone for follow up visit.        Goals Addressed             This Visit's Progress    COMPLETED: CCM (COPD) EXPECTED OUTCOME:  MONITOR, SELF-MANAGE AND REDUCE SYMPTOMS OF COPD       Current Barriers:  Knowledge Deficits related to COPD management Chronic Disease Management support and education needs related to COPD, action plan Patient reports she is trying to be careful with allergy season, verbalizes understanding of calling doctor early on for any respiratory issues Patient reports she is taking medications as prescribed Spoke today with spouse who reports pt has had no new concerns with COPD, states pt does not think pt gets outside much  Planned Interventions: Provided instruction about proper use of medications used for management of COPD including inhalers Advised patient to self assesses COPD action plan zone and make appointment with provider if in the yellow zone for 48 hours without improvement Advised patient to engage in light exercise as tolerated 3-5 days a week to aid in the the management of COPD Provided education about and advised patient to utilize infection prevention strategies to reduce risk of respiratory infection Discussed the importance of adequate rest and management of fatigue with COPD Reviewed all upcoming scheduled appointments Reviewed plan of care with spouse - case closure today-  per spouse's perspective patient's mental health issues are what is his primary concern, spouse does not want services of social worker  as pt is to see new psychiatrist in a few weeks and feels this would be confusing and upsetting for pt to have different people calling her as this has not worked out in the past  Symptom Management: Take medications as prescribed   Attend all scheduled provider appointments Call pharmacy for medication refills 3-7 days in advance of running out of medications Attend church or other social activities Perform all self care activities independently  Perform IADL's (shopping, preparing meals, housekeeping, managing finances) independently Call provider office for new concerns or questions  identify and remove indoor air pollutants limit outdoor activity during cold weather listen for public air quality announcements every day do breathing exercises every day develop a rescue plan eliminate symptom triggers at home follow rescue plan if symptoms flare-up develop a new routine to improve sleep eat healthy/prescribed diet: heart healthy, low sodium get at least 7 to 8 hours of sleep at night practice relaxation or meditation daily do breathing exercises every day Follow COPD action plan Call your doctor early on for change in health status/ symptoms Case closure today- if you need further services in the future please let your primary care provider know  Follow Up Plan: No further follow up required: Case closure           COMPLETED: CCM (HYPERTENSION) EXPECTED OUTCOME: MONITOR, SELF-MANAGE AND REDUCE SYMPTOMS OF HYPERTENSION       Current Barriers:  Knowledge Deficits related to Hypertension management Chronic Disease Management support and education needs related to Hypertension, diet Patient reports she lives with  spouse, is independent in all aspects of her care, continues to drive, has fibromyalgia which affects her ability to exercise, does stretching exercises in the morning. Patient reports she checks blood pressure on occasion with readings "always good" Spoke with patient's  spouse Danielle Hua who reports pt is no longer on Abilify, reports pt will be seeing new psychiatrist in July (on the waiting list to get in earlier if possible), patient's spouse reports pt went to ED on 05/31/22 for exacerbation of Bipolar depression and feels due to medication changes patient had decline in mental status, pt is on 2 new medications and profile updated, spouse declines Child psychotherapist citing he has resources and is aware of [facility] as pt has utilized this in the past. Update- spoke with spouse who reports pt still plans to see new psychiatrist in July, pt is still slightly manic which can affect her sleep some, pt was smoking 3 ppd cigarettes and is down to 1 ppd  Planned Interventions: Evaluation of current treatment plan related to hypertension self management and patient's adherence to plan as established by provider;   Provided education to patient re: stroke prevention, s/s of heart attack and stroke; Reviewed medications with patient and discussed importance of compliance;  Counseled on the importance of exercise goals with target of 150 minutes per week Advised patient, providing education and rationale, to monitor blood pressure daily and record, calling PCP for findings outside established parameters;  Discussed complications of poorly controlled blood pressure such as heart disease, stroke, circulatory complications, vision complications, kidney impairment, sexual dysfunction;  Plan of care reviewed- case closure today per conversation with spouse, mental health concerns are the primary issue at present, declines social work services, does not want further CCM services  Symptom Management: Take medications as prescribed   Attend all scheduled provider appointments Call pharmacy for medication refills 3-7 days in advance of running out of medications Attend church or other social activities Perform all self care activities independently  Perform IADL's  (shopping, preparing meals, housekeeping, managing finances) independently Call provider office for new concerns or questions  check blood pressure weekly write blood pressure results in a log or diary learn about high blood pressure keep a blood pressure log take blood pressure log to all doctor appointments keep all doctor appointments take medications for blood pressure exactly as prescribed begin an exercise program report new symptoms to your doctor eat more whole grains, fruits and vegetables, lean meats and healthy fats Follow low sodium diet Read food labels for sodium content Bake or broil foods instead of frying Case closure today  Follow Up Plan: No further follow up required: Case closure               Plan:No further follow up required: Case closure today  Irving Shows [facility], BSN RN Case Manager [facility] [place] [phone]

## 2022-07-06 NOTE — Patient Instructions (Signed)
Please call the care guide team at [phone] if you need to cancel or reschedule your appointment.   If you are experiencing a Mental Health or Behavioral Health Crisis or need someone to talk to, please call the Suicide and Crisis Lifeline: 988 call the [place] National Suicide Prevention Lifeline: 830-274-2280 or TTY: 417-611-8073 TTY 5103962245) to talk to a trained counselor call 1-800-273-TALK (toll free, 24 hour hotline) go to [facility] [address], [place] [phone]) call 911   Following is a copy of the CCM Program Consent:  CCM service includes personalized support from designated clinical staff supervised by the physician, including individualized plan of care and coordination with other care providers 24/7 contact phone numbers for assistance for urgent and routine care needs. Service will only be billed when office clinical staff spend 20 minutes or more in a month to coordinate care. Only one practitioner may furnish and bill the service in a calendar month. The patient may stop CCM services at amy time (effective at the end of the month) by phone call to the office staff. The patient will be responsible for cost sharing (co-pay) or up to 20% of the service fee (after annual deductible is met)  Following is a copy of your full provider care plan:   Goals Addressed             This Visit's Progress    COMPLETED: CCM (COPD) EXPECTED OUTCOME:  MONITOR, SELF-MANAGE AND REDUCE SYMPTOMS OF COPD       Current Barriers:  Knowledge Deficits related to COPD management Chronic Disease Management support and education needs related to COPD, action plan Patient reports she is trying to be careful with allergy season, verbalizes understanding of calling doctor early on for any respiratory issues Patient reports she is taking medications as prescribed Spoke today with spouse who reports pt has had no new concerns with COPD, states pt  does not think pt gets outside much  Planned Interventions: Provided instruction about proper use of medications used for management of COPD including inhalers Advised patient to self assesses COPD action plan zone and make appointment with provider if in the yellow zone for 48 hours without improvement Advised patient to engage in light exercise as tolerated 3-5 days a week to aid in the the management of COPD Provided education about and advised patient to utilize infection prevention strategies to reduce risk of respiratory infection Discussed the importance of adequate rest and management of fatigue with COPD Reviewed all upcoming scheduled appointments Reviewed plan of care with spouse - case closure today-  per spouse's perspective patient's mental health issues are what is his primary concern, spouse does not want services of social worker as pt is to see new psychiatrist in a few weeks and feels this would be confusing and upsetting for pt to have different people calling her as this has not worked out in the past  Symptom Management: Take medications as prescribed   Attend all scheduled provider appointments Call pharmacy for medication refills 3-7 days in advance of running out of medications Attend church or other social activities Perform all self care activities independently  Perform IADL's (shopping, preparing meals, housekeeping, managing finances) independently Call provider office for new concerns or questions  identify and remove indoor air pollutants limit outdoor activity during cold weather listen for public air quality announcements every day do breathing exercises every day develop a rescue plan eliminate symptom triggers at home follow rescue plan if symptoms  flare-up develop a new routine to improve sleep eat healthy/prescribed diet: heart healthy, low sodium get at least 7 to 8 hours of sleep at night practice relaxation or meditation daily do breathing  exercises every day Follow COPD action plan Call your doctor early on for change in health status/ symptoms Case closure today- if you need further services in the future please let your primary care provider know  Follow Up Plan: No further follow up required: Case closure           COMPLETED: CCM (HYPERTENSION) EXPECTED OUTCOME: MONITOR, SELF-MANAGE AND REDUCE SYMPTOMS OF HYPERTENSION       Current Barriers:  Knowledge Deficits related to Hypertension management Chronic Disease Management support and education needs related to Hypertension, diet Patient reports she lives with spouse, is independent in all aspects of her care, continues to drive, has fibromyalgia which affects her ability to exercise, does stretching exercises in the morning. Patient reports she checks blood pressure on occasion with readings "always good" Spoke with patient's spouse Onalee Hua who reports pt is no longer on Abilify, reports pt will be seeing new psychiatrist in July (on the waiting list to get in earlier if possible), patient's spouse reports pt went to ED on 05/31/22 for exacerbation of Bipolar depression and feels due to medication changes patient had decline in mental status, pt is on 2 new medications and profile updated, spouse declines Child psychotherapist citing he has resources and is aware of [facility] as pt has utilized this in the past. Update- spoke with spouse who reports pt still plans to see new psychiatrist in July, pt is still slightly manic which can affect her sleep some, pt was smoking 3 ppd cigarettes and is down to 1 ppd  Planned Interventions: Evaluation of current treatment plan related to hypertension self management and patient's adherence to plan as established by provider;   Provided education to patient re: stroke prevention, s/s of heart attack and stroke; Reviewed medications with patient and discussed importance of compliance;  Counseled on the importance of exercise goals  with target of 150 minutes per week Advised patient, providing education and rationale, to monitor blood pressure daily and record, calling PCP for findings outside established parameters;  Discussed complications of poorly controlled blood pressure such as heart disease, stroke, circulatory complications, vision complications, kidney impairment, sexual dysfunction;  Plan of care reviewed- case closure today per conversation with spouse, mental health concerns are the primary issue at present, declines social work services, does not want further CCM services  Symptom Management: Take medications as prescribed   Attend all scheduled provider appointments Call pharmacy for medication refills 3-7 days in advance of running out of medications Attend church or other social activities Perform all self care activities independently  Perform IADL's (shopping, preparing meals, housekeeping, managing finances) independently Call provider office for new concerns or questions  check blood pressure weekly write blood pressure results in a log or diary learn about high blood pressure keep a blood pressure log take blood pressure log to all doctor appointments keep all doctor appointments take medications for blood pressure exactly as prescribed begin an exercise program report new symptoms to your doctor eat more whole grains, fruits and vegetables, lean meats and healthy fats Follow low sodium diet Read food labels for sodium content Bake or broil foods instead of frying Case closure today  Follow Up Plan: No further follow up required: Case closure  Patient verbalizes understanding of instructions and care plan provided today and agrees to view in MyChart. Active MyChart status and patient understanding of how to access instructions and care plan via MyChart confirmed with patient.  No further follow up required: Case closure

## 2022-07-09 ENCOUNTER — Telehealth: Payer: Self-pay | Admitting: *Deleted

## 2022-07-09 LAB — CYTOLOGY - PAP
Comment: NEGATIVE
Diagnosis: NEGATIVE
High risk HPV: NEGATIVE

## 2022-07-09 NOTE — Progress Notes (Signed)
Her Pap and other swabs were all normal.  No signs of infection.  No abnormalities on her Pap smear.  We can repeat this again in 5 years. It is possible that the discharge that she is having could be blood.  Recommend we refer her to gynecology for further evaluation.  It is very important that we do this and that she follow up with them because post menopausal bleeding can be serious and we need to rule this out. Please place referral.  The rest of her labs are all normal.  Cholesterol levels are at goal.  A1c is at goal.  Do not need to make any changes to her treatment plan at this time.  She should continue to work on diet and exercise and we will recheck everything in a year or so.

## 2022-07-09 NOTE — Telephone Encounter (Signed)
Lab results faxed to [facility]  [phone]

## 2022-07-10 ENCOUNTER — Other Ambulatory Visit: Payer: Self-pay | Admitting: *Deleted

## 2022-07-10 DIAGNOSIS — N898 Other specified noninflammatory disorders of vagina: Secondary | ICD-10-CM

## 2022-07-12 ENCOUNTER — Other Ambulatory Visit: Payer: Self-pay | Admitting: Family Medicine

## 2022-07-15 DIAGNOSIS — J449 Chronic obstructive pulmonary disease, unspecified: Secondary | ICD-10-CM

## 2022-07-15 DIAGNOSIS — I10 Essential (primary) hypertension: Secondary | ICD-10-CM | POA: Diagnosis not present

## 2022-07-15 DIAGNOSIS — F17210 Nicotine dependence, cigarettes, uncomplicated: Secondary | ICD-10-CM | POA: Diagnosis not present

## 2022-07-17 NOTE — Telephone Encounter (Signed)
Spouse called in stating that [facility] informed them they hadn't received the lab results via fax. I confirmed fax number with caller. Caller is asking for this to be re-faxed since appointment is close by.

## 2022-07-18 NOTE — Telephone Encounter (Signed)
Results refaxed to [phone] 07/18/2022

## 2022-07-23 ENCOUNTER — Telehealth: Payer: Self-pay | Admitting: Family Medicine

## 2022-07-24 ENCOUNTER — Ambulatory Visit: Payer: Medicare PPO | Admitting: Family Medicine

## 2022-07-24 NOTE — Telephone Encounter (Signed)
Error

## 2022-07-25 ENCOUNTER — Ambulatory Visit: Payer: Medicare PPO | Admitting: Family Medicine

## 2022-07-26 ENCOUNTER — Other Ambulatory Visit: Payer: Self-pay | Admitting: Family Medicine

## 2022-07-26 DIAGNOSIS — N939 Abnormal uterine and vaginal bleeding, unspecified: Secondary | ICD-10-CM

## 2022-08-06 ENCOUNTER — Ambulatory Visit: Payer: Medicare PPO | Admitting: Internal Medicine

## 2022-08-06 ENCOUNTER — Encounter: Payer: Self-pay | Admitting: Internal Medicine

## 2022-08-06 VITALS — BP 130/80 | HR 60 | Temp 98.0°F | Ht 65.5 in | Wt 192.0 lb

## 2022-08-06 DIAGNOSIS — B370 Candidal stomatitis: Secondary | ICD-10-CM

## 2022-08-06 DIAGNOSIS — A09 Infectious gastroenteritis and colitis, unspecified: Secondary | ICD-10-CM | POA: Diagnosis not present

## 2022-08-06 DIAGNOSIS — R102 Pelvic and perineal pain: Secondary | ICD-10-CM

## 2022-08-06 DIAGNOSIS — R531 Weakness: Secondary | ICD-10-CM

## 2022-08-06 DIAGNOSIS — R110 Nausea: Secondary | ICD-10-CM | POA: Diagnosis not present

## 2022-08-06 MED ORDER — ONDANSETRON HCL 4 MG PO TABS: 4.0000 mg | ORAL_TABLET | Freq: Three times a day (TID) | ORAL | 0 refills | Status: DC | PRN

## 2022-08-06 MED ORDER — NYSTATIN 100000 UNIT/ML MT SUSP
5.0000 mL | Freq: Four times a day (QID) | OROMUCOSAL | 0 refills | Status: DC
Start: 2022-08-06 — End: 2022-08-31

## 2022-08-06 NOTE — Progress Notes (Signed)
[facility]: [phone]   Routine Medical Office Visit  Patient:  Danielle Cox      Age: 62 y.o.       Sex:  female  Date:   08/06/2022 Patient Care Team: [name], MD as PCP - General (Family Medicine) Andrena Mews, DO as Consulting Physician (Family Medicine) Lesle Reek, DDS (Dental General Practice) Ellis Savage, NP as Nurse Practitioner Drema Dallas, DO as Consulting Physician (Neurology) [facility] (Pain Medicine) Archer Asa, MD as Consulting Physician (Psychiatry) Ardell Isaacs, MD as Consulting Physician (Pain Medicine) Erroll Luna, [facility] (Inactive) as Pharmacist (Pharmacist) Today's Healthcare Provider: Lula Olszewski, MD   Assessment and Plan:   Danielle Cox was seen today for diarrhea, nausea, chills, emesis and fatigue- fatigue and diarrhea are most bothersome.  Infectious diarrhea suspected They exhibit persistent nausea, chills, weakness, and diarrhea, suggesting a possible infectious etiology, especially since their spouse has similar symptoms. There's no history of recent travel or unusual exposures, and they show no signs of severe dehydration. We will order a comprehensive stool panel, CBC, CMP, lipase, and urinalysis. Atarax will be discontinued temporarily, and they are encouraged to hydrate with an electrolyte solution, such as Liquid IV.  Persistent infectious diarrhea like this differential includes diverticulitis, clostridium difficile, or parasitic, but due to substantial toxicity and Moxifloxacin allergy, and non fulminant nature of symptoms, seemed best to wait for definitive diagnosis prior to treatment. We noted potential gastrointestinal side effects with their current medications. Bentyl and Benztropine will be discontinued temporarily.  We briefly discussed the potential benefits of XR and/or CT imaging but she expressed a preference to not move forward with my offer(s) to facilitate  the proposed intervention(s) at this time. We will revisit this option if needed at future visits and  she is always encouraged to contact me if the situation changes or she changes her mind.   -     Amylase -     Clostridium difficile EIA -     Comprehensive metabolic panel -     Fecal leukocytes -     Stool culture -     Gastrointestinal Pathogen Pnl RT, PCR -     C. difficile GDH and Toxin A/B -     Lipase -     CBC with Differential/Platelet -     Ova and parasite examination -     Urinalysis w microscopic + reflex cultur  Suprapubic pain  pain seems mild, doubtful for severe cause, at least at this time. -      Urinalysis w microscopic + reflex cultur  Nausea Advised patient to treat this and leave diarrhea untreated (except rehydration) -     Ondansetron HCl; Take 1 tablet (4 mg total) by mouth every 8 (eight) hours as needed for nausea or vomiting.  Dispense: 60 tablet; Refill: 0  Thrush  Oral Thrush: They have white plaque on their tongue, likely related to past use of Symbicort. We will prescribe an antifungal mouthwash. -     Nystatin; Use as directed 5 mLs (500,000 Units total) in the mouth or throat 4 (four) times daily. Swish and spit.  Dispense: 60 mL; Refill: 0      Follow-up: We will await lab results to guide further management. Emergency room if worsens. Recommended follow up: Return in 1 week (on 08/13/2022).  Future Appointments  Date Time Provider Department Center  08/30/2022  1:45 PM [facility] ANNUAL WELLNESS VISIT 1 [facility]    Medical Decision Making: 1 acute illness with systemic symptoms     Ordering of each unique test; Prescription drug management            Clinical Presentation:    62 y.o. female who has Nicotine dependence with current use; Essential hypertension; Cough; Personality disorder (HCC); Fibromyalgia; COPD (chronic obstructive pulmonary disease) (HCC); Cervical radiculopathy; Lumbar back pain; Seasonal allergic rhinitis due to  pollen; Arthralgia of multiple joints; Insulin resistance; Bipolar affective disorder (HCC); Gastrointestinal hemorrhage; Dyslipidemia; and Tardive dyskinesia on their problem list. Her reasons/main concerns/chief complaints for today's office visit are Diarrhea (All symptoms for more than a week. Has taken Tylenol and Pepto bismol with no relief.), Nausea, Chills, Emesis (Last week.), and Fatigue (Weakness.)   AI-Extracted: Discussed the use of AI scribe software for clinical note transcription with the patient, who gave verbal consent to proceed.  History of Present Illness   The patient presents with a prolonged episode of nausea, weakness, and diarrhea. They report the onset of symptoms began with vomiting, followed by persistent fatigue and a feeling of constant nausea. The patient has not sought medical attention for these symptoms prior to this visit, hoping the symptoms would resolve on their own.  The patient has a history of GI bleeding in 2022, managed by a GI specialist, Dr. Jason Fila. They also report their spouse has been experiencing diarrhea for the past few days, suggesting a possible infectious cause. The patient denies any recent vomiting but reports feeling chilly at times.  The patient's medication list includes Asteno, which they no longer take, and Symbicort, which they have not taken for a long time. They are unsure about the dosage of their Atarax medication but believe it to be 25mg .  The patient also reports frequent urination, likely due to increased fluid intake in the form of Gatorade and Pedialyte. They express discomfort in the lower abdomen, which they attribute to their bladder.  The patient is a smoker and has been noted to have white plaque on their tongue, suggestive of oral thrush, possibly a remnant from previous Symbicort use. They also report a history of taking Invega, a psychiatric medication, and Bentyl, both of which could potentially contribute to their current  gastrointestinal symptoms.  The patient is scheduled to travel to [place] soon and expresses concern about their ability to do so given their current condition. The patient's primary symptoms at this time are weakness, fatigue, and diarrhea.     Reviewed chart data:  has a past medical history of Anxiety, Bipolar disorder (HCC), Bronchitis, chronic (HCC), COPD (chronic obstructive pulmonary disease) (HCC), Depression, and Personality disorder (HCC). Outpatient Medications Prior to Visit  Medication Sig   acetaminophen (TYLENOL) 500 MG tablet Take 2 tablets (1,000 mg total) by mouth every 6 (six) hours as needed for mild pain or headache.   atorvastatin (LIPITOR) 20 MG tablet TAKE 1 TABLET BY MOUTH DAILY   azelastine (ASTELIN) 0.1 % nasal spray Place 2 sprays into both nostrils 2 (two) times daily.   benztropine (COGENTIN) 0.5 MG tablet Take 0.5 mg by mouth daily.   citalopram (CELEXA) 20 MG tablet Take 10 mg by mouth at bedtime.   colestipol (COLESTID) 1 g tablet Take 1 g by mouth 2 (two) times daily.   diclofenac (VOLTAREN) 75 MG EC tablet Take 1 tablet (75 mg total) by mouth 2 (two) times daily.   dicyclomine (BENTYL) 20 MG tablet Take 20 mg by mouth in the morning and at bedtime.  divalproex (DEPAKOTE) 500 MG DR tablet Take 500 mg by mouth 2 (two) times daily.   DULoxetine (CYMBALTA) 60 MG capsule Take 60 mg by mouth daily. 120 mg in the am   hydrOXYzine (ATARAX) 50 MG tablet Take 50 mg by mouth 3 (three) times daily.   modafinil (PROVIGIL) 200 MG tablet Take 1 tablet (200 mg total) by mouth in the morning and at bedtime.   omeprazole (PRILOSEC) 20 MG capsule TAKE 1 CAPSULE BY MOUTH DAILY   paliperidone (INVEGA) 6 MG 24 hr tablet Take 6 mg by mouth daily.   temazepam (RESTORIL) 15 MG capsule Take 15 mg by mouth at bedtime as needed for sleep.   traZODone (DESYREL) 150 MG tablet Take 300 mg by mouth at bedtime.   [DISCONTINUED] AUSTEDO XR 24 MG TB24 Take 1 tablet by mouth daily.    [DISCONTINUED] hydrOXYzine (ATARAX/VISTARIL) 25 MG tablet Take 25 mg by mouth 3 (three) times daily as needed.   [DISCONTINUED] SYMBICORT 80-4.5 MCG/ACT inhaler INHALE 2 PUFFS BY MOUTH TWICE A DAY   [DISCONTINUED] benztropine (COGENTIN) 1 MG tablet Take 0.5 mg by mouth at bedtime.   [DISCONTINUED] divalproex (DEPAKOTE ER) 500 MG 24 hr tablet Take 500 mg by mouth 2 (two) times daily. Taking morning and bedtime   No facility-administered medications prior to visit.         Clinical Data Analysis:   Physical Exam  BP 130/80 (BP Location: Left Arm, Patient Position: Sitting)   Pulse 60   Temp 98 F (36.7 C) (Temporal)   Ht 5' 5.5" (1.664 m)   Wt 192 lb (87.1 kg)   LMP 02/12/2016   SpO2 95%   BMI 31.46 kg/m  Wt Readings from Last 10 Encounters:  08/06/22 192 lb (87.1 kg)  07/05/22 177 lb 6.4 oz (80.5 kg)  05/31/22 177 lb 4 oz (80.4 kg)  05/02/22 177 lb 3.2 oz (80.4 kg)  03/27/22 176 lb 6.4 oz (80 kg)  12/13/21 180 lb (81.6 kg)  04/18/21 179 lb 6.4 oz (81.4 kg)  11/08/20 185 lb (83.9 kg)  10/03/20 185 lb (83.9 kg)  09/22/20 185 lb (83.9 kg)   Vital signs reviewed.  Nursing notes reviewed. Weight trend reviewed. Abnormalities and Problem-Specific physical exam findings:  suprapubic tenderness, soft, nondistended, fatigued appearing, oral thrush suspected due to white froth material on tongue. General Appearance:  No acute distress appreciable.   Well-groomed, healthy-appearing female.  Well proportioned with no abnormal fat distribution.  Good muscle tone. Skin: Clear and seem well-hydrated. Pulmonary:  Normal work of breathing at rest, no respiratory distress apparent. SpO2: 95 %  Musculoskeletal: All extremities are intact.  Neurological:  Awake, alert, oriented, and engaged.  No obvious focal neurological deficits or cognitive impairments.  Sensorium seems unclouded.   Speech is clear and coherent with logical content. Psychiatric:  Appropriate mood, pleasant and cooperative  demeanor, thoughtful and engaged during the exam  Results Reviewed:      No results found for any visits on 08/06/22.  Office Visit on 07/05/2022  Component Date Value   WBC 07/05/2022 9.9    RBC 07/05/2022 4.47    Platelets 07/05/2022 274.0    Hemoglobin 07/05/2022 13.6    HCT 07/05/2022 41.9    MCV 07/05/2022 93.9    MCHC 07/05/2022 32.3    RDW 07/05/2022 14.8    Sodium 07/05/2022 144    Potassium 07/05/2022 4.3    Chloride 07/05/2022 109    CO2 07/05/2022 26    Glucose, Bld 07/05/2022  95    BUN 07/05/2022 16    Creatinine, Ser 07/05/2022 0.62    Total Bilirubin 07/05/2022 0.3    Alkaline Phosphatase 07/05/2022 52    AST 07/05/2022 9    ALT 07/05/2022 15    Total Protein 07/05/2022 6.2    Albumin 07/05/2022 4.0    GFR 07/05/2022 97.03    Calcium 07/05/2022 9.4    TSH 07/05/2022 1.02    Hgb A1c MFr Bld 07/05/2022 5.5    Valproic Acid Lvl 07/05/2022 56.7    Vitamin B-12 07/05/2022 480    High risk HPV 07/05/2022 Negative    Adequacy 07/05/2022 Satisfactory for evaluation; transformation zone component PRESENT.    Diagnosis 07/05/2022 - Negative for intraepithelial lesion or malignancy (NILM)    Comment 07/05/2022 Normal Reference Range HPV - Negative    Neisseria Gonorrhea 07/05/2022 Negative    Chlamydia 07/05/2022 Negative    Trichomonas 07/05/2022 Negative    Bacterial Vaginitis (gar* 07/05/2022 Negative    Candida Vaginitis 07/05/2022 Negative    Candida Glabrata 07/05/2022 Negative    Comment 07/05/2022 Normal Reference Range Candida Species - Negative    Comment 07/05/2022 Normal Reference Range Candida Galbrata - Negative    Comment 07/05/2022 Normal Reference Range Trichomonas - Negative    Comment 07/05/2022 Normal Reference Ranger Chlamydia - Negative    Comment 07/05/2022 Normal Reference Range Neisseria Gonorrhea - Negative    Comment 07/05/2022 Normal Reference Range Bacterial Vaginosis - Negative    Cholesterol 07/05/2022 139    Triglycerides  07/05/2022 191.0 (H)    HDL 07/05/2022 40.50    VLDL 07/05/2022 38.2    LDL Cholesterol 07/05/2022 60    Total CHOL/HDL Ratio 07/05/2022 3    NonHDL 07/05/2022 98.43   Admission on 05/31/2022, Discharged on 06/01/2022  Component Date Value   Acetaminophen (Tylenol),* 05/31/2022 <10 (L)    Sodium 05/31/2022 139    Potassium 05/31/2022 3.9    Chloride 05/31/2022 107    CO2 05/31/2022 23    Glucose, Bld 05/31/2022 137 (H)    BUN 05/31/2022 15    Creatinine, Ser 05/31/2022 0.70    Calcium 05/31/2022 9.3    Total Protein 05/31/2022 7.4    Albumin 05/31/2022 4.4    AST 05/31/2022 22    ALT 05/31/2022 35    Alkaline Phosphatase 05/31/2022 61    Total Bilirubin 05/31/2022 0.5    GFR, Estimated 05/31/2022 >60    Anion gap 05/31/2022 9    Alcohol, Ethyl (B) 05/31/2022 <10    Salicylate Lvl 05/31/2022 <7.0 (L)    WBC 05/31/2022 15.5 (H)    RBC 05/31/2022 4.79    Hemoglobin 05/31/2022 14.8    HCT 05/31/2022 42.6    MCV 05/31/2022 88.9    MCH 05/31/2022 30.9    MCHC 05/31/2022 34.7    RDW 05/31/2022 13.0    Platelets 05/31/2022 289    nRBC 05/31/2022 0.0    Neutrophils Relative % 05/31/2022 70    Neutro Abs 05/31/2022 10.8 (H)    Lymphocytes Relative 05/31/2022 22    Lymphs Abs 05/31/2022 3.4    Monocytes Relative 05/31/2022 7    Monocytes Absolute 05/31/2022 1.1 (H)    Eosinophils Relative 05/31/2022 1    Eosinophils Absolute 05/31/2022 0.1    Basophils Relative 05/31/2022 0    Basophils Absolute 05/31/2022 0.1    Immature Granulocytes 05/31/2022 0    Abs Immature Granulocytes 05/31/2022 0.05    Opiates 05/31/2022 NONE DETECTED    Cocaine 05/31/2022 NONE DETECTED  Benzodiazepines 05/31/2022 POSITIVE (A)    Amphetamines 05/31/2022 NONE DETECTED    Tetrahydrocannabinol 05/31/2022 NONE DETECTED    Barbiturates 05/31/2022 NONE DETECTED    Color, Urine 05/31/2022 YELLOW    APPearance 05/31/2022 CLEAR    Specific Gravity, Urine 05/31/2022 1.015    pH 05/31/2022 5.0     Glucose, UA 05/31/2022 NEGATIVE    Hgb urine dipstick 05/31/2022 LARGE (A)    Bilirubin Urine 05/31/2022 NEGATIVE    Ketones, ur 05/31/2022 NEGATIVE    Protein, ur 05/31/2022 NEGATIVE    Nitrite 05/31/2022 NEGATIVE    Leukocytes,Ua 05/31/2022 TRACE (A)    RBC / HPF 05/31/2022 >50    WBC, UA 05/31/2022 6-10    Bacteria, UA 05/31/2022 NONE SEEN    Squamous Epithelial / HPF 05/31/2022 0-5    Mucus 05/31/2022 PRESENT    Ca Oxalate Crys, UA 05/31/2022 PRESENT   Hospital Outpatient Visit on 05/18/2022  Component Date Value   Glucose-Capillary 05/18/2022 118 (H)   Office Visit on 05/02/2022  Component Date Value   Total Iron Binding Capac* 05/02/2022 300    UIBC 05/02/2022 187    Iron 05/02/2022 113    Iron Saturation 05/02/2022 38    Ferritin 05/02/2022 87    T3 Uptake Ratio 05/02/2022 26    Rheumatoid fact SerPl-aC* 05/02/2022 <10.0    dsDNA Ab 05/02/2022 <1    ENA RNP Ab 05/02/2022 0.7    ENA SM Ab Ser-aCnc 05/02/2022 <0.2    Scleroderma (Scl-70) (EN* 05/02/2022 <0.2    ENA SSA (RO) Ab 05/02/2022 <0.2    ENA SSB (LA) Ab 05/02/2022 <0.2    Chromatin Ab SerPl-aCnc 05/02/2022 <0.2    Anti JO-1 05/02/2022 <0.2    Centromere Ab Screen 05/02/2022 <0.2    See below: 05/02/2022 Comment    Ammonia 05/02/2022 62    No image results found.   NM PET Metabolic Brain  Result Date: 05/23/2022 CLINICAL DATA:  Dementia. Frontotemporal dementia versus Alzheimer's type dementia EXAM: NM PET METABOLIC BRAIN TECHNIQUE: 10.0 mCi F-18 FDG was injected intravenously. Full-ring PET imaging was performed from the vertex to skull base. CT data was obtained and used for attenuation correction and anatomic localization. FASTING BLOOD GLUCOSE:  Value: 118 mg/dl COMPARISON:  None Available. FINDINGS: No decreased relative cortical metabolism in the parietal lobes. Normal relative cortical metabolism in the frontal lobes. Normal relative cortical metabolism within the occipital lobes. IMPRESSION: Cerebral  cortical metabolism within normal limits. No evidence of Alzheimer's disease pathology or frontotemporal dementia pathology. Electronically Signed   By: Genevive Bi M.D.   On: 05/23/2022 09:12   MR BRAIN W WO CONTRAST  Result Date: 05/15/2022 CLINICAL DATA:  Provided history: Dementia, non-vascular etiology suspected. Akathisia. Autoimmune disease. Alzheimer's disease, unspecified. Acute cognitive decline. Mild dementia with other behavioral disturbance, unspecified dementia type. Cognitive and behavioral changes. EXAM: MRI HEAD WITHOUT AND WITH CONTRAST TECHNIQUE: Multiplanar, multiecho pulse sequences of the brain and surrounding structures were obtained without and with intravenous contrast. CONTRAST:  8mL GADAVIST GADOBUTROL 1 MMOL/ML IV SOLN COMPARISON:  Brain MRI 01/02/2018. FINDINGS: Brain: Cerebral volume is normal. Mild multifocal T2 FLAIR hyperintense signal abnormality within the cerebral white matter, similar to the prior brain MRI of 01/02/2018. Punctate chronic microhemorrhage within the left cerebellar hemisphere. No cortical encephalomalacia is identified. There is no acute infarct. No evidence of an intracranial mass. No extra-axial fluid collection. No midline shift. No pathologic intracranial enhancement identified. Vascular: Maintained flow voids within the proximal large arterial vessels. Small developed small  venous anomaly within the posteromedial right frontal lobe (anatomic variant). Skull and upper cervical spine: No suspicious marrow lesion. Incompletely assessed cervical spondylosis. Sinuses/Orbits: No mass or acute finding within the imaged orbits. No significant paranasal sinus disease. IMPRESSION: 1.  No evidence of an acute intracranial abnormality. 2. Mild multifocal T2 FLAIR hyperintense signal abnormality within the cerebral white matter, similar to the prior brain MRI of 01/02/2018. Findings are nonspecific, but most often secondary to chronic small vessel ischemia. 3.  Punctate chronic microhemorrhage within the left cerebellar hemisphere. 4.  No age advanced or lobar predominant parenchymal atrophy. Electronically Signed   By: Jackey Loge D.O.   On: 05/15/2022 18:06       This encounter employed real-time, collaborative documentation. The patient actively reviewed and updated their medical record on a shared screen, ensuring transparency and facilitating joint problem-solving for the problem list, overview, and plan. This approach promotes accurate, informed care. The treatment plan was discussed and reviewed in detail, including medication safety, potential side effects, and all patient questions. We confirmed understanding and comfort with the plan. Follow-up instructions were established, including contacting the office for any concerns, returning if symptoms worsen, persist, or new symptoms develop, and precautions for potential emergency department visits. ----------------------------------------------------- Lula Olszewski, MD  08/06/2022 7:57 PM  [facility]:  [phone]

## 2022-08-06 NOTE — Patient Instructions (Addendum)
VISIT SUMMARY:  During your visit, we discussed your ongoing symptoms of nausea, weakness, and diarrhea. We considered the possibility of an infection, especially since your spouse has been experiencing similar symptoms. We also noted the white plaque on your tongue, which could be a sign of oral thrush, possibly related to your past use of Symbicort. We reviewed your medications and decided to temporarily discontinue some of them due to potential side effects.  YOUR PLAN:  -GASTROENTERITIS: Gastroenteritis is an inflammation of the digestive tract, often caused by an infection. We will conduct several tests, including a comprehensive stool panel, blood tests, and urinalysis, to better understand your condition. We have also advised you to stop taking Atarax temporarily and to hydrate with an electrolyte solution like Liquid IV.  -ORAL THRUSH: Oral thrush is a fungal infection in the mouth. We believe this could be related to your past use of Symbicort. We will prescribe an antifungal mouthwash to treat this.  -MEDICATION REVIEW: We reviewed your medications and found that some of them, specifically Bentyl and Benztropine, could potentially be contributing to your gastrointestinal symptoms. We have decided to temporarily discontinue these medications just in case.  INSTRUCTIONS:  Please ensure to hydrate with an electrolyte solution like Liquid IV and use the prescribed antifungal mouthwash for your oral thrush. We will contact you once we receive the results of your tests to discuss the next steps. In the meantime, please stop taking Atarax, Bentyl, and Benztropine.  It was a pleasure seeing you today! Your health and satisfaction are our top priorities.   Glenetta Hew, MD  Next Steps:  [x]  Flexible Follow-Up: We recommend a follow up with your primary care, a specialist, or me within 1-2 weeks for ensuring your problem resolves. This allows for progress monitoring and treatment  adjustments. [x]  Early Intervention: Schedule sooner appointment, call our on-call services, or go to emergency room if there is Increase in pain or discomfort New or worsening symptoms Sudden or severe changes in your health [x]  Lab & X-ray Appointments: complete or schedule to complete today, or call to schedule.  X-rays: [facility] (M-F, 8:30am-noon or 1pm-5pm).  We decided to hold off when offered at the appointment, but these can be ordered without additional appointment if you change your mind  Making the Most of Our Focused (20 minute) Follow Up Appointments:  [x]   Clearly state your top concerns at the beginning of the visit to focus our discussion [x]   If you anticipate you will need more time, please inform the front desk during scheduling - we can book multiple appointments in the same week. [x]   If you have transportation problems- use our convenient video appointments or ask about transportation support. [x]   We can get down to business faster if you use MyChart to update information before the visit and submit non-urgent questions before your visit. Thank you for taking the time to provide details through MyChart.  Let our nurse know and she can import this information into your encounter documents.  Arrival and Wait Times: [x]   Arriving on time ensures that everyone receives prompt attention. [x]   Early morning (8a) and afternoon (1p) appointments tend to have shortest wait times. [x]   Unfortunately, we cannot delay appointments for late arrivals or hold slots during phone calls.  Getting Answers and Following Up  [x]   Simple Questions & Concerns: For quick questions or basic follow-up after your visit, reach [place] at [phone] or MyChart messaging. [x]   Complex Concerns:  If your concern is more complex, scheduling an appointment might be best. Discuss this with the staff to find the most suitable option. [x]   Lab & Imaging Results: We'll contact you directly  if results are abnormal or you don't use MyChart. Most normal results will be on MyChart within 2-3 business days, with a review message from Dr. Jon Billings. Haven't heard back in 2 weeks? Need results sooner? Contact us at [phone]. [x]   Referrals: Our referral coordinator will manage specialist referrals. The specialist's office should contact you within 2 weeks to schedule an appointment. Call us if you haven't heard from them after 2 weeks.  Staying Connected  [x]   MyChart: Activate your MyChart for the fastest way to access results and message [place]. See the last page of this paperwork for instructions on how to activate.  Bring to Your Next Appointment  [x]   Medications: Please bring all your medication bottles to your next appointment to ensure we have an accurate record of your prescriptions. [x]   Health Diaries: If you're monitoring any health conditions at home, keeping a diary of your readings can be very helpful for discussions at your next appointment.  Billing  [x]   X-ray & Lab Orders: These are billed by separate companies. Contact the invoicing company directly for questions or concerns. [x]   Visit Charges: Discuss any billing inquiries with our administrative services team.  Your Satisfaction Matters  [x]   Share Your Experience: We strive for your satisfaction! If you have any complaints, or preferably compliments, please let Dr. Jon Billings know directly or contact our Practice Administrators, Edwena Felty or [employer], by asking at the front desk.   Reviewing Your Records  [x]   Review this early draft of your clinical encounter notes below and the final encounter summary tomorrow on MyChart after its been completed.   Infectious diarrhea -     Amylase -     Clostridium difficile EIA -     Comprehensive metabolic panel -     Fecal leukocytes -     Stool culture -     Gastrointestinal Pathogen Pnl RT, PCR -     C. difficile GDH and Toxin A/B -     Lipase -      CBC with Differential/Platelet -     Ova and parasite examination -     Urinalysis w microscopic + reflex cultur  Suprapubic pain -     Urinalysis w microscopic + reflex cultur  Nausea -     Amylase -     Ondansetron HCl; Take 1 tablet (4 mg total) by mouth every 8 (eight) hours as needed for nausea or vomiting.  Dispense: 60 tablet; Refill: 0  Thrush -     Nystatin; Use as directed 5 mLs (500,000 Units total) in the mouth or throat 4 (four) times daily. Swish and spit.  Dispense: 60 mL; Refill: 0  Weakness -     Amylase -     Comprehensive metabolic panel -     CBC with Differential/Platelet -     Urinalysis w microscopic + reflex cultur

## 2022-08-07 ENCOUNTER — Telehealth: Payer: Self-pay | Admitting: Family Medicine

## 2022-08-07 LAB — COMPREHENSIVE METABOLIC PANEL
ALT: 10 U/L (ref 0–35)
AST: 11 U/L (ref 0–37)
Albumin: 4 g/dL (ref 3.5–5.2)
Alkaline Phosphatase: 50 U/L (ref 39–117)
BUN: 15 mg/dL (ref 6–23)
CO2: 28 mEq/L (ref 19–32)
Calcium: 9.1 mg/dL (ref 8.4–10.5)
Chloride: 105 mEq/L (ref 96–112)
Creatinine, Ser: 0.57 mg/dL (ref 0.40–1.20)
GFR: 98.96 mL/min (ref >60.00)
Glucose, Bld: 97 mg/dL (ref 70–99)
Potassium: 4.4 mEq/L (ref 3.5–5.1)
Sodium: 141 mEq/L (ref 135–145)
Total Bilirubin: 0.3 mg/dL (ref 0.2–1.2)
Total Protein: 6.2 g/dL (ref 6.0–8.3)

## 2022-08-07 LAB — CBC WITH DIFFERENTIAL/PLATELET
Basophils Absolute: 0.1 10*3/uL (ref 0.0–0.1)
Basophils Relative: 0.6 % (ref 0.0–3.0)
Eosinophils Absolute: 0.2 10*3/uL (ref 0.0–0.7)
Eosinophils Relative: 2.3 % (ref 0.0–5.0)
HCT: 43.1 % (ref 36.0–46.0)
Hemoglobin: 13.9 g/dL (ref 12.0–15.0)
Lymphocytes Relative: 27.2 % (ref 12.0–46.0)
Lymphs Abs: 2.8 10*3/uL (ref 0.7–4.0)
MCHC: 32.3 g/dL (ref 30.0–36.0)
MCV: 94.6 fl (ref 78.0–100.0)
Monocytes Absolute: 1 10*3/uL (ref 0.1–1.0)
Monocytes Relative: 9.7 % (ref 3.0–12.0)
Neutro Abs: 6.2 10*3/uL (ref 1.4–7.7)
Neutrophils Relative %: 60.2 % (ref 43.0–77.0)
Platelets: 267 10*3/uL (ref 150.0–400.0)
RBC: 4.56 Mil/uL (ref 3.87–5.11)
RDW: 14.6 % (ref 11.5–15.5)
WBC: 10.3 10*3/uL (ref 4.0–10.5)

## 2022-08-07 LAB — AMYLASE: Amylase: 29 U/L (ref 27–131)

## 2022-08-07 LAB — LIPASE: Lipase: 29 U/L (ref 11.0–59.0)

## 2022-08-07 NOTE — Telephone Encounter (Signed)
Patient requests to be called to be given Lab/stool,urinalysis results

## 2022-08-07 NOTE — Progress Notes (Signed)
Send FODMAP diet details and check on her

## 2022-08-08 ENCOUNTER — Encounter: Payer: Self-pay | Admitting: Internal Medicine

## 2022-08-08 ENCOUNTER — Other Ambulatory Visit: Payer: Medicare PPO

## 2022-08-08 DIAGNOSIS — A09 Infectious gastroenteritis and colitis, unspecified: Secondary | ICD-10-CM | POA: Diagnosis not present

## 2022-08-08 DIAGNOSIS — F3160 Bipolar disorder, current episode mixed, unspecified: Secondary | ICD-10-CM | POA: Diagnosis not present

## 2022-08-08 DIAGNOSIS — Z79899 Other long term (current) drug therapy: Secondary | ICD-10-CM | POA: Diagnosis not present

## 2022-08-08 DIAGNOSIS — F419 Anxiety disorder, unspecified: Secondary | ICD-10-CM | POA: Diagnosis not present

## 2022-08-08 DIAGNOSIS — G4700 Insomnia, unspecified: Secondary | ICD-10-CM | POA: Diagnosis present

## 2022-08-08 DIAGNOSIS — F17200 Nicotine dependence, unspecified, uncomplicated: Secondary | ICD-10-CM | POA: Diagnosis not present

## 2022-08-08 LAB — TIQ-NTM

## 2022-08-08 MED ORDER — METRONIDAZOLE 500 MG PO TABS
500.0000 mg | ORAL_TABLET | Freq: Three times a day (TID) | ORAL | 0 refills | Status: AC
Start: 2022-08-08 — End: 2022-08-15

## 2022-08-08 NOTE — Telephone Encounter (Signed)
Pt called again and needs a call back.

## 2022-08-08 NOTE — Addendum Note (Signed)
Addended by: Lula Olszewski on: 08/08/2022 12:25 PM   Modules accepted: Orders

## 2022-08-09 LAB — C. DIFFICILE GDH AND TOXIN A/B
GDH ANTIGEN: NOT DETECTED
MICRO NUMBER:: 15241060
SPECIMEN QUALITY:: ADEQUATE
TOXIN A AND B: NOT DETECTED

## 2022-08-09 LAB — OVA AND PARASITE EXAMINATION
CONCENTRATE RESULT:: NONE SEEN
MICRO NUMBER:: 15241150
SPECIMEN QUALITY:: ADEQUATE
TRICHROME RESULT:: NONE SEEN

## 2022-08-09 LAB — TIQ-NTM

## 2022-08-09 NOTE — Progress Notes (Signed)
FYI Dr. [name], she's been quite ill with persistent gastrointestinal symptoms of uncertain cause.  Nurse follow-up message: Please follow up with the patient by phone if they haven't viewed their MyChart results within 48 hours. Discuss the following points:  Confirm if the patient is taking metronidazole as prescribed (if she can't tolerate, we can maybe send in Augmentin but she is allergic to fluoroquinolones); this was an empiric treatment we only sent in because she needed to travel and was desperate to try something to enable her to do so. Ask about improvement or persistence of symptoms (nausea, chills, weakness, diarrhea). Inquire about any new symptoms or side effects from the medication. Remind the patient about the possibility of oral thrush and ensure they're using nystatin as directed. Emphasize the importance of staying hydrated and resting. If symptoms are not improving or are worsening, schedule an urgent appointment with a gastroenterologist. Remind the patient to call immediately if symptoms significantly worsen. Schedule a follow-up appointment within the next week if not already done.

## 2022-08-11 NOTE — Progress Notes (Signed)
Please call and notify, here's a possible script  "Great news! Dr.Chayla Shands reviewed your recent testing and everything looks good!"  We didn't find any bacteria or parasites to explain the presumed gastrointestinal infection.  We just wanted to check and see how the flagyl is doing for your symptom(s).  "For any questions or to discuss further, please let us know. We appreciate you trusting [place] with your care!"

## 2022-08-13 ENCOUNTER — Telehealth: Payer: Self-pay | Admitting: Family Medicine

## 2022-08-13 NOTE — Telephone Encounter (Signed)
Pt called back and would like a call to talk about the next step, since they are still having symptoms. Please advise.

## 2022-08-15 ENCOUNTER — Encounter (INDEPENDENT_AMBULATORY_CARE_PROVIDER_SITE_OTHER): Payer: Self-pay

## 2022-08-20 ENCOUNTER — Encounter: Payer: Self-pay | Admitting: Family Medicine

## 2022-08-20 ENCOUNTER — Ambulatory Visit: Payer: Medicare PPO | Admitting: Family Medicine

## 2022-08-20 VITALS — BP 133/78 | HR 89 | Temp 97.8°F | Ht 65.5 in | Wt 180.4 lb

## 2022-08-20 DIAGNOSIS — R197 Diarrhea, unspecified: Secondary | ICD-10-CM

## 2022-08-20 MED ORDER — DIPHENOXYLATE-ATROPINE 2.5-0.025 MG PO TABS: 1.0000 | ORAL_TABLET | Freq: Four times a day (QID) | ORAL | 0 refills | Status: DC | PRN

## 2022-08-20 MED ORDER — PROMETHAZINE HCL 12.5 MG PO TABS: 12.5000 mg | ORAL_TABLET | Freq: Three times a day (TID) | ORAL | 0 refills | Status: DC | PRN

## 2022-08-20 NOTE — Progress Notes (Signed)
   Danielle Cox is a 62 y.o. female who presents today for an office visit.  Assessment/Plan:  New/Acute Problems: Diarrhea / Nausea / Abdominal Pain Initial workup with different provider a couple of weeks ago including extensive infectious workup was negative.  She did not have any improvement on Flagyl either.  Doubt that her current symptoms are infectious in etiology based on this workup thus far.  She is still having significant gastrointestinal symptoms and loose/watery diarrhea up to 10 times per day. She has lost 12 pounds since her visit here a few weeks ago.  Given severity of symptoms, she probably does not need more urgent workup at this point.  Currently her vital signs are stable and has no signs or symptoms that would warrant emergent evaluation.  She is down about 12 pounds since her visit here a few weeks ago.  For symptom management we will start Lomotil and Phenergan.  We will place order for referral to GI.  They will also call to schedule to see if they can be seen sooner.  We will also check CT abdomen and pelvis while they are waiting on GI referral.  We did discuss reasons to return to care or seek emergent care.     Subjective:  HPI:  See A/P for status of chronic condition.  Patient is here today with diarrhea.  She did see a different provider this a couple of weeks ago.  At that time had extensive testing done including stool panel, CBC, c-Met, lipase, and urinalysis.  This all was negative.  She was started on Flagyl.  This did not help. No dietary changes. No blood in stool. She is having about 10 bowel movements per day.  She is here with her husband today.  He has also been ill with similar symptoms for the last several weeks.  His symptoms are a little bit more mild than hers.  She has been seeing a different provider for this as well and was recently started on azithromycin.  He has not yet started this.  Patient's symptoms have been the same over the last couple  of weeks.  Symptoms are very severe.  She is having quite a bit of abdominal cramping and pain.  She has not tried any specific treatments for this.  She has lost about 12 pounds over the last few weeks.       Objective:  Physical Exam: BP 133/78   Pulse 89   Temp 97.8 F (36.6 C) (Temporal)   Ht 5' 5.5" (1.664 m)   Wt 180 lb 6.4 oz (81.8 kg)   LMP 02/12/2016   SpO2 97%   BMI 29.56 kg/m   Wt Readings from Last 3 Encounters:  08/20/22 180 lb 6.4 oz (81.8 kg)  08/06/22 192 lb (87.1 kg)  07/05/22 177 lb 6.4 oz (80.5 kg)    Gen: No acute distress, resting comfortably Neuro: Grossly normal, moves all extremities Psych: Normal affect and thought content  Time Spent: 30 minutes of total time was spent on the date of the encounter performing the following actions: chart review prior to seeing the patient including recent visit with other providers, obtaining history, performing a medically necessary exam, counseling on the treatment plan, placing orders, and documenting in our EHR.        Katina Degree. [name], MD 08/20/2022 2:21 PM

## 2022-08-20 NOTE — Patient Instructions (Signed)
It was very nice to see you today!  I will send in medication to help you with your symptoms.  We will check a CT scan and refer you to gastroenterology.  Take care, Dr [name]  PLEASE NOTE:  If you had any lab tests, please let us know if you have not heard back within a few days. You may see your results on mychart before we have a chance to review them but we will give you a call once they are reviewed by [place].   If we ordered any referrals today, please let us know if you have not heard from their office within the next week.   If you had any urgent prescriptions sent in today, please check with the pharmacy within an hour of our visit to make sure the prescription was transmitted appropriately.   Please try these tips to maintain a healthy lifestyle:  Eat at least 3 REAL meals and 1-2 snacks per day.  Aim for no more than 5 hours between eating.  If you eat breakfast, please do so within one hour of getting up.   Each meal should contain half fruits/vegetables, one quarter protein, and one quarter carbs (no bigger than a computer mouse)  Cut down on sweet beverages. This includes juice, soda, and sweet tea.   Drink at least 1 glass of water with each meal and aim for at least 8 glasses per day  Exercise at least 150 minutes every week.

## 2022-08-21 ENCOUNTER — Encounter: Payer: Self-pay | Admitting: Internal Medicine

## 2022-08-21 DIAGNOSIS — R197 Diarrhea, unspecified: Secondary | ICD-10-CM | POA: Diagnosis not present

## 2022-08-21 DIAGNOSIS — R1084 Generalized abdominal pain: Secondary | ICD-10-CM | POA: Diagnosis not present

## 2022-08-22 ENCOUNTER — Inpatient Hospital Stay (HOSPITAL_COMMUNITY)
Admission: EM | Admit: 2022-08-22 | Discharge: 2022-08-31 | DRG: 872 | Disposition: A | Discharge status: Home / Self Care | Payer: Medicare PPO | Attending: Family Medicine | Admitting: Family Medicine

## 2022-08-22 ENCOUNTER — Emergency Department (HOSPITAL_COMMUNITY): Payer: Medicare PPO

## 2022-08-22 ENCOUNTER — Other Ambulatory Visit: Payer: Self-pay

## 2022-08-22 ENCOUNTER — Encounter (HOSPITAL_COMMUNITY): Payer: Self-pay | Admitting: Emergency Medicine

## 2022-08-22 DIAGNOSIS — Z881 Allergy status to other antibiotic agents status: Secondary | ICD-10-CM | POA: Diagnosis not present

## 2022-08-22 DIAGNOSIS — Z6830 Body mass index (BMI) 30.0-30.9, adult: Secondary | ICD-10-CM | POA: Diagnosis not present

## 2022-08-22 DIAGNOSIS — K5720 Diverticulitis of large intestine with perforation and abscess without bleeding: Secondary | ICD-10-CM | POA: Diagnosis present

## 2022-08-22 DIAGNOSIS — R627 Adult failure to thrive: Secondary | ICD-10-CM | POA: Diagnosis present

## 2022-08-22 DIAGNOSIS — K7689 Other specified diseases of liver: Secondary | ICD-10-CM | POA: Diagnosis not present

## 2022-08-22 DIAGNOSIS — R1030 Lower abdominal pain, unspecified: Secondary | ICD-10-CM | POA: Diagnosis not present

## 2022-08-22 DIAGNOSIS — E785 Hyperlipidemia, unspecified: Secondary | ICD-10-CM | POA: Diagnosis present

## 2022-08-22 DIAGNOSIS — F419 Anxiety disorder, unspecified: Secondary | ICD-10-CM | POA: Diagnosis not present

## 2022-08-22 DIAGNOSIS — I10 Essential (primary) hypertension: Secondary | ICD-10-CM | POA: Diagnosis present

## 2022-08-22 DIAGNOSIS — A4151 Sepsis due to Escherichia coli [E. coli]: Principal | ICD-10-CM | POA: Diagnosis present

## 2022-08-22 DIAGNOSIS — N3941 Urge incontinence: Secondary | ICD-10-CM | POA: Diagnosis present

## 2022-08-22 DIAGNOSIS — M797 Fibromyalgia: Secondary | ICD-10-CM | POA: Diagnosis present

## 2022-08-22 DIAGNOSIS — F319 Bipolar disorder, unspecified: Secondary | ICD-10-CM | POA: Diagnosis present

## 2022-08-22 DIAGNOSIS — A0472 Enterocolitis due to Clostridium difficile, not specified as recurrent: Secondary | ICD-10-CM | POA: Diagnosis present

## 2022-08-22 DIAGNOSIS — R1084 Generalized abdominal pain: Secondary | ICD-10-CM | POA: Diagnosis not present

## 2022-08-22 DIAGNOSIS — K5732 Diverticulitis of large intestine without perforation or abscess without bleeding: Secondary | ICD-10-CM | POA: Diagnosis present

## 2022-08-22 DIAGNOSIS — Z888 Allergy status to other drugs, medicaments and biological substances status: Secondary | ICD-10-CM

## 2022-08-22 DIAGNOSIS — E876 Hypokalemia: Secondary | ICD-10-CM | POA: Diagnosis present

## 2022-08-22 DIAGNOSIS — R000 Tachycardia, unspecified: Secondary | ICD-10-CM | POA: Diagnosis not present

## 2022-08-22 DIAGNOSIS — R6510 Systemic inflammatory response syndrome (SIRS) of non-infectious origin without acute organ dysfunction: Secondary | ICD-10-CM | POA: Diagnosis present

## 2022-08-22 DIAGNOSIS — K319 Disease of stomach and duodenum, unspecified: Secondary | ICD-10-CM | POA: Insufficient documentation

## 2022-08-22 DIAGNOSIS — Z880 Allergy status to penicillin: Secondary | ICD-10-CM

## 2022-08-22 DIAGNOSIS — I700 Atherosclerosis of aorta: Secondary | ICD-10-CM | POA: Diagnosis not present

## 2022-08-22 DIAGNOSIS — F17200 Nicotine dependence, unspecified, uncomplicated: Secondary | ICD-10-CM | POA: Diagnosis present

## 2022-08-22 DIAGNOSIS — N281 Cyst of kidney, acquired: Secondary | ICD-10-CM | POA: Diagnosis not present

## 2022-08-22 DIAGNOSIS — F3130 Bipolar disorder, current episode depressed, mild or moderate severity, unspecified: Secondary | ICD-10-CM | POA: Diagnosis present

## 2022-08-22 DIAGNOSIS — N200 Calculus of kidney: Secondary | ICD-10-CM | POA: Diagnosis present

## 2022-08-22 DIAGNOSIS — F17210 Nicotine dependence, cigarettes, uncomplicated: Secondary | ICD-10-CM | POA: Diagnosis present

## 2022-08-22 DIAGNOSIS — Z1152 Encounter for screening for COVID-19: Secondary | ICD-10-CM

## 2022-08-22 DIAGNOSIS — K219 Gastro-esophageal reflux disease without esophagitis: Secondary | ICD-10-CM | POA: Diagnosis present

## 2022-08-22 DIAGNOSIS — K529 Noninfective gastroenteritis and colitis, unspecified: Secondary | ICD-10-CM | POA: Diagnosis present

## 2022-08-22 DIAGNOSIS — K5792 Diverticulitis of intestine, part unspecified, without perforation or abscess without bleeding: Secondary | ICD-10-CM | POA: Diagnosis not present

## 2022-08-22 DIAGNOSIS — K5780 Diverticulitis of intestine, part unspecified, with perforation and abscess without bleeding: Secondary | ICD-10-CM | POA: Diagnosis not present

## 2022-08-22 DIAGNOSIS — J42 Unspecified chronic bronchitis: Secondary | ICD-10-CM

## 2022-08-22 DIAGNOSIS — G2401 Drug induced subacute dyskinesia: Secondary | ICD-10-CM | POA: Diagnosis present

## 2022-08-22 DIAGNOSIS — E88819 Insulin resistance, unspecified: Secondary | ICD-10-CM | POA: Diagnosis present

## 2022-08-22 DIAGNOSIS — A0471 Enterocolitis due to Clostridium difficile, recurrent: Secondary | ICD-10-CM | POA: Diagnosis not present

## 2022-08-22 DIAGNOSIS — R339 Retention of urine, unspecified: Secondary | ICD-10-CM | POA: Diagnosis present

## 2022-08-22 DIAGNOSIS — Z811 Family history of alcohol abuse and dependence: Secondary | ICD-10-CM

## 2022-08-22 DIAGNOSIS — K559 Vascular disorder of intestine, unspecified: Secondary | ICD-10-CM | POA: Diagnosis not present

## 2022-08-22 DIAGNOSIS — Z809 Family history of malignant neoplasm, unspecified: Secondary | ICD-10-CM

## 2022-08-22 DIAGNOSIS — A419 Sepsis, unspecified organism: Secondary | ICD-10-CM | POA: Diagnosis not present

## 2022-08-22 DIAGNOSIS — G4710 Hypersomnia, unspecified: Secondary | ICD-10-CM | POA: Diagnosis present

## 2022-08-22 DIAGNOSIS — D122 Benign neoplasm of ascending colon: Secondary | ICD-10-CM | POA: Diagnosis present

## 2022-08-22 DIAGNOSIS — Z818 Family history of other mental and behavioral disorders: Secondary | ICD-10-CM

## 2022-08-22 DIAGNOSIS — G4700 Insomnia, unspecified: Secondary | ICD-10-CM | POA: Diagnosis present

## 2022-08-22 DIAGNOSIS — J301 Allergic rhinitis due to pollen: Secondary | ICD-10-CM | POA: Diagnosis present

## 2022-08-22 DIAGNOSIS — F609 Personality disorder, unspecified: Secondary | ICD-10-CM | POA: Diagnosis present

## 2022-08-22 DIAGNOSIS — K55039 Acute (reversible) ischemia of large intestine, extent unspecified: Secondary | ICD-10-CM | POA: Diagnosis not present

## 2022-08-22 DIAGNOSIS — R590 Localized enlarged lymph nodes: Secondary | ICD-10-CM | POA: Diagnosis not present

## 2022-08-22 DIAGNOSIS — J449 Chronic obstructive pulmonary disease, unspecified: Secondary | ICD-10-CM | POA: Diagnosis present

## 2022-08-22 DIAGNOSIS — Z79899 Other long term (current) drug therapy: Secondary | ICD-10-CM

## 2022-08-22 DIAGNOSIS — Z8619 Personal history of other infectious and parasitic diseases: Secondary | ICD-10-CM | POA: Insufficient documentation

## 2022-08-22 DIAGNOSIS — J4489 Other specified chronic obstructive pulmonary disease: Secondary | ICD-10-CM | POA: Diagnosis present

## 2022-08-22 DIAGNOSIS — F3160 Bipolar disorder, current episode mixed, unspecified: Secondary | ICD-10-CM | POA: Diagnosis not present

## 2022-08-22 DIAGNOSIS — R933 Abnormal findings on diagnostic imaging of other parts of digestive tract: Secondary | ICD-10-CM | POA: Diagnosis not present

## 2022-08-22 LAB — URINALYSIS, ROUTINE W REFLEX MICROSCOPIC
Bilirubin Urine: NEGATIVE
Glucose, UA: NEGATIVE mg/dL
Hgb urine dipstick: NEGATIVE
Ketones, ur: 5 mg/dL — AB
Nitrite: NEGATIVE
Protein, ur: NEGATIVE mg/dL
Specific Gravity, Urine: 1.023 (ref 1.005–1.030)
pH: 6 (ref 5.0–8.0)

## 2022-08-22 LAB — RAPID URINE DRUG SCREEN, HOSP PERFORMED
Amphetamines: NOT DETECTED
Barbiturates: NOT DETECTED
Benzodiazepines: POSITIVE — AB
Cocaine: NOT DETECTED
Opiates: NOT DETECTED
Tetrahydrocannabinol: NOT DETECTED

## 2022-08-22 LAB — COMPREHENSIVE METABOLIC PANEL
ALT: 16 U/L (ref 0–44)
AST: 14 U/L — ABNORMAL LOW (ref 15–41)
Albumin: 3.3 g/dL — ABNORMAL LOW (ref 3.5–5.0)
Alkaline Phosphatase: 46 U/L (ref 38–126)
Anion gap: 9 (ref 5–15)
BUN: 18 mg/dL (ref 6–20)
CO2: 24 mmol/L (ref 22–32)
Calcium: 8.6 mg/dL — ABNORMAL LOW (ref 8.9–10.3)
Chloride: 104 mmol/L (ref 98–111)
Creatinine, Ser: 0.78 mg/dL (ref 0.44–1.00)
GFR, Estimated: >60 mL/min (ref >60)
Glucose, Bld: 102 mg/dL — ABNORMAL HIGH (ref 70–99)
Potassium: 3.6 mmol/L (ref 3.5–5.1)
Sodium: 137 mmol/L (ref 135–145)
Total Bilirubin: 0.7 mg/dL (ref 0.3–1.2)
Total Protein: 6.3 g/dL — ABNORMAL LOW (ref 6.5–8.1)

## 2022-08-22 LAB — CBC
HCT: 41.5 % (ref 36.0–46.0)
Hemoglobin: 13.9 g/dL (ref 12.0–15.0)
MCH: 30.8 pg (ref 26.0–34.0)
MCHC: 33.5 g/dL (ref 30.0–36.0)
MCV: 91.8 fL (ref 80.0–100.0)
Platelets: 182 10*3/uL (ref 150–400)
RBC: 4.52 MIL/uL (ref 3.87–5.11)
RDW: 14.4 % (ref 11.5–15.5)
WBC: 13.4 10*3/uL — ABNORMAL HIGH (ref 4.0–10.5)
nRBC: 0 % (ref 0.0–0.2)

## 2022-08-22 LAB — SARS CORONAVIRUS 2 BY RT PCR: SARS Coronavirus 2 by RT PCR: NEGATIVE

## 2022-08-22 LAB — LACTIC ACID, PLASMA: Lactic Acid, Venous: 1.3 mmol/L (ref 0.5–1.9)

## 2022-08-22 LAB — VALPROIC ACID LEVEL: Valproic Acid Lvl: 51 ug/mL (ref 50.0–100.0)

## 2022-08-22 LAB — ETHANOL: Alcohol, Ethyl (B): <10 mg/dL (ref <10)

## 2022-08-22 LAB — LIPASE, BLOOD: Lipase: 26 U/L (ref 11–51)

## 2022-08-22 MED ORDER — NICOTINE 14 MG/24HR TD PT24
14.0000 mg | MEDICATED_PATCH | Freq: Once | TRANSDERMAL | Status: DC
  Administered 2022-08-22: 14 mg via TRANSDERMAL
  Filled 2022-08-22: qty 1

## 2022-08-22 MED ORDER — IOHEXOL 300 MG/ML  SOLN
100.0000 mL | Freq: Once | INTRAMUSCULAR | Status: AC | PRN
  Administered 2022-08-22: 100 mL via INTRAVENOUS

## 2022-08-22 MED ORDER — ACETAMINOPHEN 500 MG PO TABS
1000.0000 mg | ORAL_TABLET | Freq: Once | ORAL | Status: AC
  Administered 2022-08-22: 1000 mg via ORAL
  Filled 2022-08-22: qty 2

## 2022-08-22 MED ORDER — SODIUM CHLORIDE 0.9 % IV SOLN
2.0000 g | Freq: Once | INTRAVENOUS | Status: AC
  Administered 2022-08-22: 2 g via INTRAVENOUS
  Filled 2022-08-22: qty 20

## 2022-08-22 MED ORDER — METRONIDAZOLE 500 MG/100ML IV SOLN
500.0000 mg | Freq: Once | INTRAVENOUS | Status: AC
  Administered 2022-08-22: 500 mg via INTRAVENOUS
  Filled 2022-08-22: qty 100

## 2022-08-22 MED ORDER — LACTATED RINGERS IV BOLUS (SEPSIS)
1000.0000 mL | Freq: Once | INTRAVENOUS | Status: AC
  Administered 2022-08-22: 1000 mL via INTRAVENOUS

## 2022-08-22 MED ORDER — LACTATED RINGERS IV SOLN: INTRAVENOUS | Status: DC

## 2022-08-22 NOTE — Progress Notes (Signed)
Elink monitoring for the code sepsis protocol.  

## 2022-08-22 NOTE — ED Provider Notes (Signed)
[place] EMERGENCY DEPARTMENT AT [facility] Provider Note   CSN: [id number] Arrival date & time: 08/22/22  1704    History  Chief Complaint  Patient presents with   Diarrhea    Danielle Cox is a 62 y.o. female here for evaluation of diarrhea and fever.  Reports she has some chronic diarrhea at baseline previously on Celexa Paul.  Over the last month has developed worsening diarrhea, husband had loose stool as well.  Having greater than 10 episodes a day.  No bloody stool.  She was seen by PCP trialed round of Flagyl did not help.  Took Imodium did not help as well.  Initially had stool cultures performed which were negative.  She was seen by GI yesterday who got additional stool cultures however has not resulted.  He prescribed her Lomotil.  Patient states today she started developing fevers as high as 102.3 at home.  He has had nausea without vomiting.  No chest pain, shortness of breath or cough.  No recent travel.  She has some lightheadedness and feels dehydrated.  Taking Bentyl without relief of her abdominal pain.  Pain is diffuse in nature.  No dysuria or hematuria.  She feels generally unwell.  Has lost approximately 15 pounds in the last month due to her persistent diarrhea    HPI     Home Medications Prior to Admission medications   Medication Sig Start Date End Date Taking? Authorizing Provider  acetaminophen (TYLENOL) 500 MG tablet Take 2 tablets (1,000 mg total) by mouth every 6 (six) hours as needed for mild pain or headache. 05/18/19   Rankin, Shuvon B, NP  atorvastatin (LIPITOR) 20 MG tablet TAKE 1 TABLET BY MOUTH DAILY 05/28/22   [name], MD  azelastine (ASTELIN) 0.1 % nasal spray Place 2 sprays into both nostrils 2 (two) times daily. 12/13/21   [name], MD  benztropine (COGENTIN) 0.5 MG tablet Take 0.5 mg by mouth daily. 07/17/22   [provider]  citalopram (CELEXA) 20 MG tablet Take 10 mg by mouth at bedtime. 05/25/22   [provider]  colestipol (COLESTID) 1 g tablet Take 1 g by mouth 2 (two) times daily. 05/01/22   [provider]  diclofenac (VOLTAREN) 75 MG EC tablet Take 1 tablet (75 mg total) by mouth 2 (two) times daily. Patient not taking: Reported on 08/20/2022 12/13/21   [name], MD  dicyclomine (BENTYL) 20 MG tablet Take 20 mg by mouth in the morning and at bedtime.    [provider]  diphenoxylate-atropine (LOMOTIL) 2.5-0.025 MG tablet Take 1 tablet by mouth 4 (four) times daily as needed for diarrhea or loose stools. 08/20/22   [name], MD  divalproex (DEPAKOTE) 500 MG DR tablet Take 500 mg by mouth 2 (two) times daily. 07/14/22   [provider]  DULoxetine (CYMBALTA) 60 MG capsule Take 60 mg by mouth daily. 120 mg in the am    [provider]  modafinil (PROVIGIL) 200 MG tablet Take 1 tablet (200 mg total) by mouth in the morning and at bedtime. 09/22/20   [name], MD  nystatin (MYCOSTATIN) 100000 UNIT/ML suspension Use as directed 5 mLs (500,000 Units total) in the mouth or throat 4 (four) times daily. Swish and spit. 08/06/22   Lula Olszewski, MD  omeprazole (PRILOSEC) 20 MG capsule TAKE 1 CAPSULE BY MOUTH DAILY 07/12/22   [name], MD  paliperidone (INVEGA) 6 MG 24 hr  tablet Take 6 mg by mouth daily.    [provider]  promethazine (PHENERGAN) 12.5 MG tablet Take 1 tablet (12.5 mg total) by mouth every 8 (eight) hours as needed for nausea or vomiting. 08/20/22   [name], MD  temazepam (RESTORIL) 15 MG capsule Take 15 mg by mouth at bedtime as needed for sleep.    [provider]  traZODone (DESYREL) 150 MG tablet Take 300 mg by mouth at bedtime.    [provider]      Allergies    Moxifloxacin hcl, Penicillins, and Corticosteroids    Review of Systems   Review of Systems  Constitutional:  Positive for activity change, appetite change, fatigue and fever.  HENT: Negative.    Respiratory: Negative.     Cardiovascular: Negative.   Gastrointestinal:  Positive for abdominal pain, diarrhea, nausea and vomiting. Negative for abdominal distention, anal bleeding, blood in stool, constipation and rectal pain.  Genitourinary: Negative.   Musculoskeletal: Negative.   Skin: Negative.   Neurological:  Positive for weakness. Negative for dizziness, tremors, seizures, syncope, facial asymmetry, speech difficulty, light-headedness, numbness and headaches.  All other systems reviewed and are negative.   Physical Exam Updated Vital Signs BP (!) 110/39   Pulse 74   Temp (!) 101.2 F (38.4 C) (Oral)   Resp 20   LMP 02/12/2016   SpO2 93%  Physical Exam Vitals and nursing note reviewed.  Constitutional:      General: She is not in acute distress.    Appearance: She is well-developed. She is ill-appearing. She is not toxic-appearing or diaphoretic.     Comments: Shaking consistent with rigors  HENT:     Head: Atraumatic.     Nose: Nose normal.     Mouth/Throat:     Mouth: Mucous membranes are dry.     Comments: Dry mucous member Eyes:     Pupils: Pupils are equal, round, and reactive to light.  Cardiovascular:     Rate and Rhythm: Tachycardia present.     Pulses: Normal pulses.     Heart sounds: Normal heart sounds.     Comments: Lungs clear, tachycardic Pulmonary:     Effort: No respiratory distress.  Abdominal:     General: Bowel sounds are normal. There is no distension.     Palpations: Abdomen is soft.     Tenderness: There is abdominal tenderness. There is guarding. There is no right CVA tenderness, left CVA tenderness or rebound.     Comments: Diffuse tenderness with voluntary guarding no rebound.  No focal pain  Musculoskeletal:        General: Normal range of motion.     Cervical back: Normal range of motion.  Skin:    General: Skin is warm and dry.     Capillary Refill: Capillary refill takes less than 2 seconds.  Neurological:     General: No focal deficit present.      Mental Status: She is alert and oriented to person, place, and time.     Comments: No clonus Normal reflexes  Psychiatric:        Mood and Affect: Mood normal.     ED Results / Procedures / Treatments   Labs (all labs ordered are listed, but only abnormal results are displayed) Labs Reviewed  COMPREHENSIVE METABOLIC PANEL - Abnormal; Notable for the following components:      Result Value   Glucose, Bld 102 (*)    Calcium 8.6 (*)    Total  Protein 6.3 (*)    Albumin 3.3 (*)    AST 14 (*)    All other components within normal limits  CBC - Abnormal; Notable for the following components:   WBC 13.4 (*)    All other components within normal limits  URINALYSIS, ROUTINE W REFLEX MICROSCOPIC - Abnormal; Notable for the following components:   Ketones, ur 5 (*)    Leukocytes,Ua MODERATE (*)    Bacteria, UA RARE (*)    All other components within normal limits  RAPID URINE DRUG SCREEN, HOSP PERFORMED - Abnormal; Notable for the following components:   Benzodiazepines POSITIVE (*)    All other components within normal limits  SARS CORONAVIRUS 2 BY RT PCR  CULTURE, BLOOD (ROUTINE X 2)  CULTURE, BLOOD (ROUTINE X 2)  GASTROINTESTINAL PANEL BY PCR, STOOL (REPLACES STOOL CULTURE)  C DIFFICILE QUICK SCREEN W PCR REFLEX    LIPASE, BLOOD  LACTIC ACID, PLASMA  ETHANOL  LACTIC ACID, PLASMA  VALPROIC ACID LEVEL    EKG None  Radiology CT ABDOMEN PELVIS W CONTRAST  Result Date: 08/22/2022 CLINICAL DATA:  Abdominal pain. EXAM: CT ABDOMEN AND PELVIS WITH CONTRAST TECHNIQUE: Multidetector CT imaging of the abdomen and pelvis was performed using the standard protocol following bolus administration of intravenous contrast. RADIATION DOSE REDUCTION: This exam was performed according to the departmental dose-optimization program which includes automated exposure control, adjustment of the mA and/or kV according to patient size and/or use of iterative reconstruction technique. CONTRAST:   OMNIPAQUE IOHEXOL 300 MG/ML  SOLN COMPARISON:  None Available. FINDINGS: Lower chest: Lung bases clear.  No pericardial or pleural effusion. Hepatobiliary: Numerous bilobar hepatic cysts, largest in the lateral left lobe measuring 2.5 cm. Biliary ductal dilatation. Unremarkable gallbladder. Pancreas: Unremarkable. No pancreatic ductal dilatation or surrounding inflammatory changes. Spleen: Normal in size without focal abnormality. Adrenals/Urinary Tract: Left kidney cysts identified largest 2.5 cm. Intrarenal stones in the left measuring about 7 mm. No hydronephrosis. Adrenal glands unremarkable. Urinary bladder unremarkable. Stomach/Bowel: Stomach is within normal limits. Appendix appears normal. Diverticulosis identified descending and sigmoid colon. There is Peri diverticular fat stranding in the sigmoid mesentery consistent with diverticulitis. No abscess or obstruction. Vascular/Lymphatic: Aortic atherosclerosis. No enlarged abdominal or pelvic lymph nodes. Reproductive: Uterus and bilateral adnexa are unremarkable. Other: No abdominal wall hernia or abnormality. No abdominopelvic ascites. Musculoskeletal: Lumbosacral degenerative changes. No acute osseous abnormalities. IMPRESSION: 1. Sigmoid diverticulitis.  No abscess or obstruction. 2. Numerous cysts in the liver as well as left kidney. 3. Nonobstructing left renal stones. Electronically Signed   By: Layla Maw M.D.   On: 08/22/2022 20:20   DG Chest Portable 1 View  Result Date: 08/22/2022 CLINICAL DATA:  Sepsis. EXAM: PORTABLE CHEST 1 VIEW COMPARISON:  Chest radiograph dated 08/22/2018. FINDINGS: No focal consolidation, pleural effusion, or pneumothorax. Stable cardiac silhouette. Atherosclerotic calcification of the aortic arch. Scoliosis. No acute osseous pathology. IMPRESSION: No active disease. Electronically Signed   By: Elgie Collard M.D.   On: 08/22/2022 19:45    Procedures .Critical Care  Performed by: Linwood Dibbles,  PA-C Authorized by: Linwood Dibbles, PA-C   Critical care provider statement:    Critical care time (minutes):  35   Critical care was necessary to treat or prevent imminent or life-threatening deterioration of the following conditions:  Sepsis   Critical care was time spent personally by me on the following activities:  Development of treatment plan with patient or surrogate, discussions with consultants, evaluation of patient's response to  treatment, examination of patient, ordering and review of laboratory studies, ordering and review of radiographic studies, ordering and performing treatments and interventions, pulse oximetry, re-evaluation of patient's condition and review of old charts     Medications Ordered in ED Medications  nicotine (NICODERM CQ - dosed in mg/24 hours) patch 14 mg (14 mg Transdermal Patch Applied 08/22/22 1927)  lactated ringers infusion (has no administration in time range)  metroNIDAZOLE (FLAGYL) IVPB 500 mg (500 mg Intravenous New Bag/Given 08/22/22 2018)  lactated ringers bolus 1,000 mL (1,000 mLs Intravenous New Bag/Given 08/22/22 1927)  cefTRIAXone (ROCEPHIN) 2 g in sodium chloride 0.9 % 100 mL IVPB (0 g Intravenous Stopped 08/22/22 2032)  acetaminophen (TYLENOL) tablet 1,000 mg (1,000 mg Oral Given 08/22/22 1927)  iohexol (OMNIPAQUE) 300 MG/ML solution 100 mL (100 mLs Intravenous Contrast Given 08/22/22 1950)    ED Course/ Medical Decision Making/ A&P   62 year old here for evaluation of diarrhea and feeling unwell.  To diarrhea over the last month however has significantly worsened.  Having greater than 10 episodes a day.  Has lost 15 pounds in less than 1 month due to her loose stool.  Has generalized abdominal pain.  Today developed fever up to 102.  No UTI symptoms.  No URI symptoms.  Husband has had diarrhea as well however not as severe as patient.  No recent travel.  She was on Flagyl for antibiotics initially had stool cultures performed at symptom onset which  were negative.  On arrival patient febrile, tachycardic, tachypneic.  Code sepsis called.  Antibiotics given for intra-abdominal source.  Will plan on collecting stool sample again.  Will also plan on CT scan  Labs and imaging personally viewed and interpreted:  CBC leukocytosis 13.4 Metabolic panel without significant abnormality Lipase 26 UA with moderate leuks, and negative nitrates, rare bacteria Lactic 1.3 Chest x-ray without cardiomegaly, pneumothorax, pneumonia, edema CT AP uncomplicated diverticulitis  Discussed results with patient.  Tachycardia improved with IV fluids.  No hypotension.  Given duration of symptoms, meet SIRS criteria will admit for further management  Discussed with Dr. Emmit Pomfret who is agreeable for admission  The patient appears reasonably stabilized for admission considering the current resources, flow, and capabilities available in the ED at this time, and I doubt any other [facility] requiring further screening and/or treatment in the ED prior to admission.    Did consider serotonin syndrome given her extensive psychiatric medications however no clonus, normal reflexes, no altered mental status.                                Medical Decision Making Amount and/or Complexity of Data Reviewed Independent Historian: spouse External Data Reviewed: labs, radiology, ECG and notes. Labs: ordered. Decision-making details documented in ED Course. Radiology: ordered and independent interpretation performed. Decision-making details documented in ED Course. ECG/medicine tests: ordered and independent interpretation performed. Decision-making details documented in ED Course.  Risk OTC drugs. Prescription drug management. Parenteral controlled substances. Decision regarding hospitalization.          Final Clinical Impression(s) / ED Diagnoses Final diagnoses:  Diverticulitis  SIRS (systemic inflammatory response syndrome) (HCC)    Rx / DC Orders ED  Discharge Orders     None         Emali Heyward A, PA-C 08/22/22 2052    Arby Barrette, MD 08/28/22 1452

## 2022-08-22 NOTE — ED Triage Notes (Signed)
Pt reports diarrhea and weakness that started 1 month ago. Pt reports that she was seen by her PCP and was tested for c-diff which was negative. Husband stating that pt had temp of 103 today.

## 2022-08-22 NOTE — H&P (Signed)
History and Physical   [facility] [place] [facility] Admission History and Physical [employer], D.O.    Patient Name: Danielle Cox MR#: [id number] Date of Birth: 03-02-1960 Date of Admission: 08/22/2022  Referring MD/NP/PA: Ralph Leyden Primary Care Physician: [name], MD  Chief Complaint:  Chief Complaint  Patient presents with   Diarrhea    HPI: Danielle Cox is a 62 y.o. female with a known history of anxiety, depression, bipolar, COPD presents to the emergency department for evaluation of diarrhea.  Patient was in a usual state of health until one month ago she and her husband developed diarrhea more than 10 episodes a day refractory to Flagyl, Imodium and lomotil and Bentyl.  Initial stool cultures were negative. Today she developed fevers to 102.  Also reports 15lb weight loss in the last month.   Patient denies fevers/chills, weakness, dizziness, chest pain, shortness of breath, N/V/C/D, abdominal pain, dysuria/frequency, changes in mental status.    Otherwise there has been no change in status. Patient has been taking medication as prescribed and there has been no recent change in medication or diet.  No recent antibiotics.  There has been no recent illness, hospitalizations, travel or sick contacts.    EMS/ED Course: Patient received Rocephin Flagyl. Medical admission has been requested for further management of Acute diverticulitis.  Review of Systems:  CONSTITUTIONAL: Positive fever/chills, weight loss, fatigue, No weakness, headache. EYES: No blurry or double vision. ENT: No tinnitus, postnasal drip, redness or soreness of the oropharynx. RESPIRATORY: No cough, dyspnea, wheeze.  No hemoptysis.  CARDIOVASCULAR: No chest pain, palpitations, syncope, orthopnea. No lower extremity edema.  GASTROINTESTINAL: Positive nausea, abdominal pain, diarrhea, No vomiting or constipation.  No hematemesis, melena or hematochezia. GENITOURINARY: No dysuria,  frequency, hematuria. ENDOCRINE: No polyuria or nocturia. No heat or cold intolerance. HEMATOLOGY: No anemia, bruising, bleeding. INTEGUMENTARY: No rashes, ulcers, lesions. MUSCULOSKELETAL: No arthritis, gout. NEUROLOGIC: No numbness, tingling, ataxia, seizure-type activity, weakness. PSYCHIATRIC: No anxiety, depression, insomnia.   Past Medical History:  Diagnosis Date   Anxiety    Bipolar disorder (HCC)    Bronchitis, chronic (HCC)    COPD (chronic obstructive pulmonary disease) (HCC)    Depression    Personality disorder (HCC)     Past Surgical History:  Procedure Laterality Date   CARPAL TUNNEL RELEASE Right    CESAREAN SECTION  1983, 67, 61   TONSILLECTOMY  age 47     reports that she has been smoking cigarettes. She has a 60 pack-year smoking history. She has never used smokeless tobacco. She reports that she does not drink alcohol and does not use drugs.  Allergies  Allergen Reactions   Moxifloxacin Hcl Palpitations    REACTION: tackycardia   Penicillins Rash    Has patient had a PCN reaction causing immediate rash, facial/tongue/throat swelling, SOB or lightheadedness with hypotension: Yes Has patient had a PCN reaction causing severe rash involving mucus membranes or skin necrosis: No Has patient had a PCN reaction that required hospitalization: No Has patient had a PCN reaction occurring within the last 10 years: No If all of the above answers are "NO", then may proceed with Cephalosporin use.    Corticosteroids Other (See Comments)    Mania    Family History  Problem Relation Age of Onset   Cancer Mother    Depression Mother    Alcohol abuse Mother    Depression Father    Alcohol abuse Father    Cancer Sister  Alcohol abuse Sister    Depression Sister    Alcohol abuse Brother    Depression Brother        overdose   Breast cancer Neg Hx     Prior to Admission medications   Medication Sig Start Date End Date Taking? Authorizing Provider   acetaminophen (TYLENOL) 500 MG tablet Take 2 tablets (1,000 mg total) by mouth every 6 (six) hours as needed for mild pain or headache. 05/18/19   Rankin, Shuvon B, NP  atorvastatin (LIPITOR) 20 MG tablet TAKE 1 TABLET BY MOUTH DAILY 05/28/22   [name], MD  azelastine (ASTELIN) 0.1 % nasal spray Place 2 sprays into both nostrils 2 (two) times daily. 12/13/21   [name], MD  benztropine (COGENTIN) 0.5 MG tablet Take 0.5 mg by mouth daily. 07/17/22   [provider]  citalopram (CELEXA) 20 MG tablet Take 10 mg by mouth at bedtime. 05/25/22   [provider]  colestipol (COLESTID) 1 g tablet Take 1 g by mouth 2 (two) times daily. 05/01/22   [provider]  diclofenac (VOLTAREN) 75 MG EC tablet Take 1 tablet (75 mg total) by mouth 2 (two) times daily. Patient not taking: Reported on 08/20/2022 12/13/21   [name], MD  dicyclomine (BENTYL) 20 MG tablet Take 20 mg by mouth in the morning and at bedtime.    [provider]  diphenoxylate-atropine (LOMOTIL) 2.5-0.025 MG tablet Take 1 tablet by mouth 4 (four) times daily as needed for diarrhea or loose stools. 08/20/22   [name], MD  divalproex (DEPAKOTE) 500 MG DR tablet Take 500 mg by mouth 2 (two) times daily. 07/14/22   [provider]  DULoxetine (CYMBALTA) 60 MG capsule Take 60 mg by mouth daily. 120 mg in the am    [provider]  modafinil (PROVIGIL) 200 MG tablet Take 1 tablet (200 mg total) by mouth in the morning and at bedtime. 09/22/20   [name], MD  nystatin (MYCOSTATIN) 100000 UNIT/ML suspension Use as directed 5 mLs (500,000 Units total) in the mouth or throat 4 (four) times daily. Swish and spit. 08/06/22   Lula Olszewski, MD  omeprazole (PRILOSEC) 20 MG capsule TAKE 1 CAPSULE BY MOUTH DAILY 07/12/22   [name], MD  paliperidone (INVEGA) 6 MG 24 hr tablet Take 6 mg by mouth daily.    [provider]  promethazine (PHENERGAN) 12.5 MG tablet Take 1  tablet (12.5 mg total) by mouth every 8 (eight) hours as needed for nausea or vomiting. 08/20/22   [name], MD  temazepam (RESTORIL) 15 MG capsule Take 15 mg by mouth at bedtime as needed for sleep.    [provider]  traZODone (DESYREL) 150 MG tablet Take 300 mg by mouth at bedtime.    [provider]    Physical Exam: Vitals:   08/22/22 1721 08/22/22 1859 08/22/22 2030  BP: 133/74  (!) 110/39  Pulse: (!) 102  74  Resp: (!) 22  20  Temp: 98.5 F (36.9 C) (!) 101.2 F (38.4 C)   TempSrc: Oral Oral   SpO2: 95%  93%    GENERAL: 62 y.o.-year-old white female patient, well-developed, well-nourished lying in the bed in no acute distress.  Pleasant and cooperative.   HEENT: Head atraumatic, normocephalic. Pupils equal. Mucus membranes dry NECK: Supple. No JVD. CHEST: Normal breath sounds bilaterally. No wheezing, rales, rhonchi or crackles. No use of accessory muscles of respiration.  No  reproducible chest wall tenderness.  CARDIOVASCULAR: S1, S2 normal. No murmurs, rubs, or gallops. Cap refill <2 seconds. Pulses intact distally.  ABDOMEN: Soft, diffusely tender No rebound, guarding, rigidity. Normoactive bowel sounds present in all four quadrants.  EXTREMITIES: No pedal edema, cyanosis, or clubbing. No calf tenderness or Homan's sign.  NEUROLOGIC: The patient is alert and oriented x 3. Cranial nerves II through XII are grossly intact with no focal sensorimotor deficit. PSYCHIATRIC:  Normal affect, mood, thought content. SKIN: Warm, dry, and intact without obvious rash, lesion, or ulcer.    Labs on Admission:  CBC: Recent Labs  Lab 08/22/22 1723  WBC 13.4*  HGB 13.9  HCT 41.5  MCV 91.8  PLT 182   Basic Metabolic Panel: Recent Labs  Lab 08/22/22 1723  NA 137  K 3.6  CL 104  CO2 24  GLUCOSE 102*  BUN 18  CREATININE 0.78  CALCIUM 8.6*   GFR: Estimated Creatinine Clearance: 79.8 mL/min (by C-G formula based on SCr of 0.78 mg/dL). Liver Function  Tests: Recent Labs  Lab 08/22/22 1723  AST 14*  ALT 16  ALKPHOS 46  BILITOT 0.7  PROT 6.3*  ALBUMIN 3.3*   Recent Labs  Lab 08/22/22 1723  LIPASE 26   No results for input(s): "AMMONIA" in the last 168 hours. Coagulation Profile: No results for input(s): "INR", "PROTIME" in the last 168 hours. Cardiac Enzymes: No results for input(s): "CKTOTAL", "CKMB", "CKMBINDEX", "TROPONINI" in the last 168 hours. BNP (last 3 results) No results for input(s): "PROBNP" in the last 8760 hours. HbA1C: No results for input(s): "HGBA1C" in the last 72 hours. CBG: No results for input(s): "GLUCAP" in the last 168 hours. Lipid Profile: No results for input(s): "CHOL", "HDL", "LDLCALC", "TRIG", "CHOLHDL", "LDLDIRECT" in the last 72 hours. Thyroid Function Tests: No results for input(s): "TSH", "T4TOTAL", "FREET4", "T3FREE", "THYROIDAB" in the last 72 hours. Anemia Panel: No results for input(s): "VITAMINB12", "FOLATE", "FERRITIN", "TIBC", "IRON", "RETICCTPCT" in the last 72 hours. Urine analysis:    Component Value Date/Time   COLORURINE YELLOW 08/22/2022 1911   APPEARANCEUR CLEAR 08/22/2022 1911   LABSPEC 1.023 08/22/2022 1911   PHURINE 6.0 08/22/2022 1911   GLUCOSEU NEGATIVE 08/22/2022 1911   GLUCOSEU NEGATIVE 12/31/2017 1329   HGBUR NEGATIVE 08/22/2022 1911   BILIRUBINUR NEGATIVE 08/22/2022 1911   BILIRUBINUR negative 06/07/2020 1009   KETONESUR 5 (A) 08/22/2022 1911   PROTEINUR NEGATIVE 08/22/2022 1911   UROBILINOGEN 0.2 06/07/2020 1009   UROBILINOGEN 0.2 12/31/2017 1329   NITRITE NEGATIVE 08/22/2022 1911   LEUKOCYTESUR MODERATE (A) 08/22/2022 1911   Sepsis Labs: @LABRCNTIP (procalcitonin:4,lacticidven:4) ) Recent Results (from the past 240 hour(s))  SARS Coronavirus 2 by RT PCR (hospital order, performed in [facility] hospital lab) *cepheid single result test* Anterior Nasal Swab     Status: None   Collection Time: 08/22/22  7:21 PM   Specimen: Anterior Nasal Swab  Result  Value Ref Range Status   SARS Coronavirus 2 by RT PCR NEGATIVE NEGATIVE Final    Comment: (NOTE) SARS-CoV-2 target nucleic acids are NOT DETECTED.  The SARS-CoV-2 RNA is generally detectable in upper and lower respiratory specimens during the acute phase of infection. The lowest concentration of SARS-CoV-2 viral copies this assay can detect is 250 copies / mL. A negative result does not preclude SARS-CoV-2 infection and should not be used as the sole basis for treatment or other patient management decisions.  A negative result may occur with improper specimen collection / handling, submission of specimen other  than nasopharyngeal swab, presence of viral mutation(s) within the areas targeted by this assay, and inadequate number of viral copies (<250 copies / mL). A negative result must be combined with clinical observations, patient history, and epidemiological information.  Fact Sheet for Patients:   RoadLapTop.co.za  Fact Sheet for Healthcare Providers: http://kim-miller.com/  This test is not yet approved or  cleared by the [place] FDA and has been authorized for detection and/or diagnosis of SARS-CoV-2 by FDA under an Emergency Use Authorization (EUA).  This EUA will remain in effect (meaning this test can be used) for the duration of the COVID-19 declaration under Section 564(b)(1) of the Act, 21 U.S.C. section 360bbb-3(b)(1), unless the authorization is terminated or revoked sooner.  Performed at [facility], [address] [place], [place] [address]      Radiological Exams on Admission: CT ABDOMEN PELVIS W CONTRAST  Result Date: 08/22/2022 CLINICAL DATA:  Abdominal pain. EXAM: CT ABDOMEN AND PELVIS WITH CONTRAST TECHNIQUE: Multidetector CT imaging of the abdomen and pelvis was performed using the standard protocol following bolus administration of intravenous contrast. RADIATION DOSE REDUCTION: This  exam was performed according to the departmental dose-optimization program which includes automated exposure control, adjustment of the mA and/or kV according to patient size and/or use of iterative reconstruction technique. CONTRAST:  OMNIPAQUE IOHEXOL 300 MG/ML  SOLN COMPARISON:  None Available. FINDINGS: Lower chest: Lung bases clear.  No pericardial or pleural effusion. Hepatobiliary: Numerous bilobar hepatic cysts, largest in the lateral left lobe measuring 2.5 cm. Biliary ductal dilatation. Unremarkable gallbladder. Pancreas: Unremarkable. No pancreatic ductal dilatation or surrounding inflammatory changes. Spleen: Normal in size without focal abnormality. Adrenals/Urinary Tract: Left kidney cysts identified largest 2.5 cm. Intrarenal stones in the left measuring about 7 mm. No hydronephrosis. Adrenal glands unremarkable. Urinary bladder unremarkable. Stomach/Bowel: Stomach is within normal limits. Appendix appears normal. Diverticulosis identified descending and sigmoid colon. There is Peri diverticular fat stranding in the sigmoid mesentery consistent with diverticulitis. No abscess or obstruction. Vascular/Lymphatic: Aortic atherosclerosis. No enlarged abdominal or pelvic lymph nodes. Reproductive: Uterus and bilateral adnexa are unremarkable. Other: No abdominal wall hernia or abnormality. No abdominopelvic ascites. Musculoskeletal: Lumbosacral degenerative changes. No acute osseous abnormalities. IMPRESSION: 1. Sigmoid diverticulitis.  No abscess or obstruction. 2. Numerous cysts in the liver as well as left kidney. 3. Nonobstructing left renal stones. Electronically Signed   By: Layla Maw M.D.   On: 08/22/2022 20:20   DG Chest Portable 1 View  Result Date: 08/22/2022 CLINICAL DATA:  Sepsis. EXAM: PORTABLE CHEST 1 VIEW COMPARISON:  Chest radiograph dated 08/22/2018. FINDINGS: No focal consolidation, pleural effusion, or pneumothorax. Stable cardiac silhouette. Atherosclerotic calcification  of the aortic arch. Scoliosis. No acute osseous pathology. IMPRESSION: No active disease. Electronically Signed   By: Elgie Collard M.D.   On: 08/22/2022 19:45    Assessment/Plan  This is a 61 y.o. female with a history of anxiety, depression, bipolar, COPD now being admitted with:  #. Sepsis secondary to sigmoid diverticulitis - Admit to inpatient with telemetry monitoring - IV antibiotics: Rocephin Flagyl - IV fluid hydration - Follow up blood, stool cultures, CDiff - Repeat CBC in am.    #. History of anxiety, depression, bipolar  #. History of HLD  #. History of GERD - Continue ***  Admission status: Inpatient IV Fluids: NS Diet/Nutrition: Clear liquids Consults called: None  DVT Px: Lovenox, SCDs and early ambulation. Code Status: Full Code  Disposition Plan: To home in 1-2 days  All the  records are reviewed and case discussed with ED provider. Management plans discussed with the patient and/or family who express understanding and agree with plan of care.  Evonne Rinks D.O. on 08/22/2022 at 8:59 PM CC: Primary care physician; [name], MD   08/22/2022, 8:59 PM

## 2022-08-23 ENCOUNTER — Encounter (HOSPITAL_COMMUNITY): Payer: Self-pay | Admitting: Family Medicine

## 2022-08-23 DIAGNOSIS — K5732 Diverticulitis of large intestine without perforation or abscess without bleeding: Secondary | ICD-10-CM

## 2022-08-23 LAB — BASIC METABOLIC PANEL
Anion gap: 7 (ref 5–15)
BUN: 11 mg/dL (ref 6–20)
CO2: 24 mmol/L (ref 22–32)
Calcium: 8.3 mg/dL — ABNORMAL LOW (ref 8.9–10.3)
Chloride: 107 mmol/L (ref 98–111)
Creatinine, Ser: 0.61 mg/dL (ref 0.44–1.00)
GFR, Estimated: >60 mL/min (ref >60)
Glucose, Bld: 148 mg/dL — ABNORMAL HIGH (ref 70–99)
Potassium: 3.1 mmol/L — ABNORMAL LOW (ref 3.5–5.1)
Sodium: 138 mmol/L (ref 135–145)

## 2022-08-23 LAB — PROTIME-INR
INR: 1.2 (ref 0.8–1.2)
Prothrombin Time: 15.1 seconds (ref 11.4–15.2)

## 2022-08-23 LAB — CBC
HCT: 39.3 % (ref 36.0–46.0)
Hemoglobin: 12.7 g/dL (ref 12.0–15.0)
MCH: 30.4 pg (ref 26.0–34.0)
MCHC: 32.3 g/dL (ref 30.0–36.0)
MCV: 94 fL (ref 80.0–100.0)
Platelets: 148 10*3/uL — ABNORMAL LOW (ref 150–400)
RBC: 4.18 MIL/uL (ref 3.87–5.11)
RDW: 14.7 % (ref 11.5–15.5)
WBC: 10.8 10*3/uL — ABNORMAL HIGH (ref 4.0–10.5)
nRBC: 0 % (ref 0.0–0.2)

## 2022-08-23 LAB — MAGNESIUM: Magnesium: 2 mg/dL (ref 1.7–2.4)

## 2022-08-23 LAB — APTT: aPTT: 35 seconds (ref 24–36)

## 2022-08-23 LAB — HIV ANTIBODY (ROUTINE TESTING W REFLEX): HIV Screen 4th Generation wRfx: NONREACTIVE

## 2022-08-23 MED ORDER — NYSTATIN 100000 UNIT/ML MT SUSP
5.0000 mL | Freq: Four times a day (QID) | OROMUCOSAL | Status: DC
  Administered 2022-08-23 – 2022-08-31 (×31): 500000 [IU] via ORAL
  Filled 2022-08-23 (×34): qty 5

## 2022-08-23 MED ORDER — MORPHINE SULFATE (PF) 4 MG/ML IV SOLN: 4.0000 mg | Freq: Four times a day (QID) | INTRAVENOUS | Status: DC | PRN

## 2022-08-23 MED ORDER — SODIUM CHLORIDE 0.9 % IV SOLN
2.0000 g | INTRAVENOUS | Status: DC
  Administered 2022-08-23 – 2022-08-25 (×3): 2 g via INTRAVENOUS
  Filled 2022-08-23 (×3): qty 20

## 2022-08-23 MED ORDER — IPRATROPIUM BROMIDE 0.02 % IN SOLN: 0.5000 mg | Freq: Four times a day (QID) | RESPIRATORY_TRACT | Status: DC | PRN

## 2022-08-23 MED ORDER — LORAZEPAM 1 MG PO TABS
1.0000 mg | ORAL_TABLET | Freq: Once | ORAL | Status: AC
  Administered 2022-08-23: 1 mg via ORAL
  Filled 2022-08-23: qty 1

## 2022-08-23 MED ORDER — PALIPERIDONE ER 6 MG PO TB24: 6.0000 mg | ORAL_TABLET | Freq: Every day | ORAL | Status: DC

## 2022-08-23 MED ORDER — PANTOPRAZOLE SODIUM 40 MG PO TBEC
40.0000 mg | DELAYED_RELEASE_TABLET | Freq: Every day | ORAL | Status: DC
  Administered 2022-08-23 – 2022-08-31 (×9): 40 mg via ORAL
  Filled 2022-08-23 (×9): qty 1

## 2022-08-23 MED ORDER — METRONIDAZOLE 500 MG/100ML IV SOLN: 500.0000 mg | Freq: Two times a day (BID) | INTRAVENOUS | Status: DC

## 2022-08-23 MED ORDER — MORPHINE SULFATE (PF) 2 MG/ML IV SOLN
2.0000 mg | INTRAVENOUS | Status: DC | PRN
  Administered 2022-08-24 – 2022-08-27 (×4): 2 mg via INTRAVENOUS
  Filled 2022-08-23 (×5): qty 1

## 2022-08-23 MED ORDER — LACTATED RINGERS IV SOLN: 150.0000 mL/h | INTRAVENOUS | Status: DC

## 2022-08-23 MED ORDER — ALBUTEROL SULFATE (2.5 MG/3ML) 0.083% IN NEBU: 2.5000 mg | INHALATION_SOLUTION | Freq: Four times a day (QID) | RESPIRATORY_TRACT | Status: DC | PRN

## 2022-08-23 MED ORDER — ATORVASTATIN CALCIUM 20 MG PO TABS
20.0000 mg | ORAL_TABLET | Freq: Every day | ORAL | Status: DC
  Administered 2022-08-23 – 2022-08-31 (×9): 20 mg via ORAL
  Filled 2022-08-23 (×6): qty 1
  Filled 2022-08-23: qty 2
  Filled 2022-08-23 (×2): qty 1

## 2022-08-23 MED ORDER — BOOST / RESOURCE BREEZE PO LIQD CUSTOM
1.0000 | Freq: Three times a day (TID) | ORAL | Status: DC
  Administered 2022-08-23 – 2022-08-26 (×9): 1 via ORAL

## 2022-08-23 MED ORDER — NICOTINE 21 MG/24HR TD PT24
21.0000 mg | MEDICATED_PATCH | Freq: Every day | TRANSDERMAL | Status: DC
  Administered 2022-08-23 – 2022-08-31 (×9): 21 mg via TRANSDERMAL
  Filled 2022-08-23 (×9): qty 1

## 2022-08-23 MED ORDER — TRAZODONE HCL 100 MG PO TABS
300.0000 mg | ORAL_TABLET | Freq: Every day | ORAL | Status: DC
  Administered 2022-08-23 – 2022-08-30 (×8): 300 mg via ORAL
  Filled 2022-08-23 (×8): qty 3

## 2022-08-23 MED ORDER — ENOXAPARIN SODIUM 40 MG/0.4ML IJ SOSY
40.0000 mg | PREFILLED_SYRINGE | INTRAMUSCULAR | Status: DC
  Administered 2022-08-23 – 2022-08-28 (×6): 40 mg via SUBCUTANEOUS
  Filled 2022-08-23 (×6): qty 0.4

## 2022-08-23 MED ORDER — PALIPERIDONE ER 6 MG PO TB24
6.0000 mg | ORAL_TABLET | Freq: Every day | ORAL | Status: DC
  Administered 2022-08-23 – 2022-08-31 (×9): 6 mg via ORAL
  Filled 2022-08-23 (×9): qty 1

## 2022-08-23 MED ORDER — HYDRALAZINE HCL 20 MG/ML IJ SOLN: 5.0000 mg | Freq: Three times a day (TID) | INTRAMUSCULAR | Status: DC | PRN

## 2022-08-23 MED ORDER — LACTATED RINGERS IV BOLUS
500.0000 mL | Freq: Once | INTRAVENOUS | Status: AC
  Administered 2022-08-23: 500 mL via INTRAVENOUS

## 2022-08-23 MED ORDER — LACTATED RINGERS IV SOLN: INTRAVENOUS | Status: DC

## 2022-08-23 MED ORDER — TEMAZEPAM 15 MG PO CAPS
30.0000 mg | ORAL_CAPSULE | Freq: Every evening | ORAL | Status: DC | PRN
  Administered 2022-08-23 – 2022-08-30 (×5): 30 mg via ORAL
  Filled 2022-08-23 (×5): qty 2

## 2022-08-23 MED ORDER — ACETAMINOPHEN 325 MG PO TABS
650.0000 mg | ORAL_TABLET | Freq: Four times a day (QID) | ORAL | Status: DC | PRN
  Administered 2022-08-23 – 2022-08-27 (×8): 650 mg via ORAL
  Filled 2022-08-23 (×9): qty 2

## 2022-08-23 MED ORDER — MODAFINIL 200 MG PO TABS
200.0000 mg | ORAL_TABLET | Freq: Two times a day (BID) | ORAL | Status: DC
  Administered 2022-08-23 – 2022-08-31 (×18): 200 mg via ORAL
  Filled 2022-08-23 (×18): qty 1

## 2022-08-23 MED ORDER — SODIUM CHLORIDE 0.9 % IV SOLN: 2.0000 g | INTRAVENOUS | Status: DC

## 2022-08-23 MED ORDER — DIVALPROEX SODIUM 250 MG PO DR TAB
500.0000 mg | DELAYED_RELEASE_TABLET | Freq: Two times a day (BID) | ORAL | Status: DC
  Administered 2022-08-23 – 2022-08-31 (×17): 500 mg via ORAL
  Filled 2022-08-23 (×6): qty 2
  Filled 2022-08-23: qty 1
  Filled 2022-08-23 (×4): qty 2
  Filled 2022-08-23: qty 1
  Filled 2022-08-23 (×5): qty 2

## 2022-08-23 MED ORDER — BENZTROPINE MESYLATE 0.5 MG PO TABS
0.5000 mg | ORAL_TABLET | Freq: Every day | ORAL | Status: DC
  Administered 2022-08-23 – 2022-08-31 (×9): 0.5 mg via ORAL
  Filled 2022-08-23 (×9): qty 1

## 2022-08-23 MED ORDER — SODIUM CHLORIDE 0.9% FLUSH
3.0000 mL | Freq: Two times a day (BID) | INTRAVENOUS | Status: DC
  Administered 2022-08-23 – 2022-08-31 (×9): 3 mL via INTRAVENOUS

## 2022-08-23 MED ORDER — SODIUM CHLORIDE 0.9 % IV SOLN: INTRAVENOUS | Status: DC

## 2022-08-23 MED ORDER — METRONIDAZOLE 500 MG/100ML IV SOLN
500.0000 mg | Freq: Two times a day (BID) | INTRAVENOUS | Status: DC
  Administered 2022-08-23 – 2022-08-26 (×7): 500 mg via INTRAVENOUS
  Filled 2022-08-23 (×7): qty 100

## 2022-08-23 NOTE — ED Notes (Signed)
ED TO INPATIENT HANDOFF REPORT  ED Nurse Name and Phone #: Richarda Osmond Name/Age/Gender Danielle Cox 62 y.o. female Room/Bed: WA16/WA16  Code Status   Code Status: Full Code  Home/SNF/Other Home Patient oriented to: self, place, time, and situation Is this baseline? Yes   Triage Complete: Triage complete  Chief Complaint Sigmoid diverticulitis [K57.32]  Triage Note Pt reports diarrhea and weakness that started 1 month ago. Pt reports that she was seen by her PCP and was tested for c-diff which was negative. Husband stating that pt had temp of 103 today.    Allergies Allergies  Allergen Reactions   Moxifloxacin Hcl Palpitations    REACTION: tackycardia   Penicillins Rash    Has patient had a PCN reaction causing immediate rash, facial/tongue/throat swelling, SOB or lightheadedness with hypotension: Yes Has patient had a PCN reaction causing severe rash involving mucus membranes or skin necrosis: No Has patient had a PCN reaction that required hospitalization: No Has patient had a PCN reaction occurring within the last 10 years: No If all of the above answers are "NO", then may proceed with Cephalosporin use.    Corticosteroids Other (See Comments)    Mania    Level of Care/Admitting Diagnosis ED Disposition     ED Disposition  Admit   Condition  --   Comment  Hospital Area: [facility] [[id number]]  Level of Care: Telemetry [5]  Admit to tele based on following criteria: Monitor for Ischemic changes  May admit patient to [facility] or [facility] if equivalent level of care is available:: No  Covid Evaluation: Confirmed COVID Negative  Diagnosis: Sigmoid diverticulitis [161096]  Admitting Physician: Tonye Royalty [[id number]]  Attending Physician: Janann Colonel  Certification:: I certify this patient will need inpatient services for at least 2 midnights  Estimated Length of Stay: 2          B Medical/Surgery  History Past Medical History:  Diagnosis Date   Anxiety    Bipolar disorder (HCC)    Bronchitis, chronic (HCC)    COPD (chronic obstructive pulmonary disease) (HCC)    Depression    Personality disorder (HCC)    Past Surgical History:  Procedure Laterality Date   CARPAL TUNNEL RELEASE Right    CESAREAN SECTION  1983, 1988, 20   TONSILLECTOMY  age 65     A IV Location/Drains/Wounds Patient Lines/Drains/Airways Status     Active Line/Drains/Airways     Name Placement date Placement time Site Days   Peripheral IV 08/22/22 20 G 1" Left Antecubital 08/22/22  1907  Antecubital  1   Peripheral IV 08/22/22 20 G Distal;Posterior;Right Forearm 08/22/22  2011  Forearm  1   Peripheral IV 08/23/22 Left;Posterior Wrist 08/23/22  0620  Wrist  less than 1            Intake/Output Last 24 hours No intake or output data in the 24 hours ending 08/23/22 1305  Labs/Imaging Results for orders placed or performed during the hospital encounter of 08/22/22 (from the past 48 hour(s))  Lipase, blood     Status: None   Collection Time: 08/22/22  5:23 PM  Result Value Ref Range   Lipase 26 11 - 51 U/L    Comment: Performed at [facility], [address] [place], [place] [address]  Comprehensive metabolic panel     Status: Abnormal   Collection Time: 08/22/22  5:23 PM  Result Value Ref Range   Sodium 137 135 -  145 mmol/L   Potassium 3.6 3.5 - 5.1 mmol/L   Chloride 104 98 - 111 mmol/L   CO2 24 22 - 32 mmol/L   Glucose, Bld 102 (H) 70 - 99 mg/dL    Comment: Glucose reference range applies only to samples taken after fasting for at least 8 hours.   BUN 18 6 - 20 mg/dL   Creatinine, Ser 1.61 0.44 - 1.00 mg/dL   Calcium 8.6 (L) 8.9 - 10.3 mg/dL   Total Protein 6.3 (L) 6.5 - 8.1 g/dL   Albumin 3.3 (L) 3.5 - 5.0 g/dL   AST 14 (L) 15 - 41 U/L   ALT 16 0 - 44 U/L   Alkaline Phosphatase 46 38 - 126 U/L   Total Bilirubin 0.7 0.3 - 1.2 mg/dL   GFR, Estimated >09 >60 mL/min     Comment: (NOTE) Calculated using the CKD-EPI Creatinine Equation (2021)    Anion gap 9 5 - 15    Comment: Performed at [facility], [address], [place], [place] [address]  CBC     Status: Abnormal   Collection Time: 08/22/22  5:23 PM  Result Value Ref Range   WBC 13.4 (H) 4.0 - 10.5 K/uL   RBC 4.52 3.87 - 5.11 MIL/uL   Hemoglobin 13.9 12.0 - 15.0 g/dL   HCT 81.1 91.4 - 78.2 %   MCV 91.8 80.0 - 100.0 fL   MCH 30.8 26.0 - 34.0 pg   MCHC 33.5 30.0 - 36.0 g/dL   RDW 95.6 21.3 - 08.6 %   Platelets 182 150 - 400 K/uL   nRBC 0.0 0.0 - 0.2 %    Comment: Performed at [facility], [address], [place], [place] [address]  Blood culture (routine x 2)     Status: None (Preliminary result)   Collection Time: 08/22/22  6:29 PM   Specimen: BLOOD  Result Value Ref Range   Specimen Description      BLOOD RIGHT ANTECUBITAL Performed at [facility], [address], [place], [place] [address]    Special Requests      BOTTLES DRAWN AEROBIC AND ANAEROBIC Blood Culture adequate volume Performed at [facility], [address], [place], [place] [address]    Culture      NO GROWTH < 12 HOURS Performed at [facility], [address], [place], [place] [address]    Report Status PENDING   Lactic acid, plasma     Status: None   Collection Time: 08/22/22  6:29 PM  Result Value Ref Range   Lactic Acid, Venous 1.3 0.5 - 1.9 mmol/L    Comment: Performed at [facility], [address], [place], [place] [address]  Blood culture (routine x 2)     Status: None (Preliminary result)   Collection Time: 08/22/22  6:30 PM   Specimen: BLOOD  Result Value Ref Range   Specimen Description      BLOOD LEFT ANTECUBITAL Performed at [facility], [address] [place], [place] [address]    Special Requests      BOTTLES DRAWN AEROBIC AND ANAEROBIC Blood Culture results may not be  optimal due to an excessive volume of blood received in culture bottles Performed at [facility], [address] [place], [place] [address]    Culture      NO GROWTH < 12 HOURS Performed at [facility], [address], [place],  [place] [address]    Report Status PENDING   Urinalysis, Routine w reflex microscopic -Urine, Clean Catch     Status: Abnormal   Collection Time: 08/22/22  7:11 PM  Result Value Ref Range   Color, Urine YELLOW YELLOW   APPearance CLEAR CLEAR   Specific Gravity, Urine 1.023 1.005 - 1.030   pH 6.0 5.0 - 8.0   Glucose, UA NEGATIVE NEGATIVE mg/dL   Hgb urine dipstick NEGATIVE NEGATIVE   Bilirubin Urine NEGATIVE NEGATIVE   Ketones, ur 5 (A) NEGATIVE mg/dL   Protein, ur NEGATIVE NEGATIVE mg/dL   Nitrite NEGATIVE NEGATIVE   Leukocytes,Ua MODERATE (A) NEGATIVE   RBC / HPF 0-5 0 - 5 RBC/hpf   WBC, UA 11-20 0 - 5 WBC/hpf   Bacteria, UA RARE (A) NONE SEEN   Squamous Epithelial / HPF 0-5 0 - 5 /HPF   Mucus PRESENT     Comment: Performed at [facility], [address], [place], [place] [address]  Rapid urine drug screen (hospital performed)     Status: Abnormal   Collection Time: 08/22/22  7:11 PM  Result Value Ref Range   Opiates NONE DETECTED NONE DETECTED   Cocaine NONE DETECTED NONE DETECTED   Benzodiazepines POSITIVE (A) NONE DETECTED   Amphetamines NONE DETECTED NONE DETECTED   Tetrahydrocannabinol NONE DETECTED NONE DETECTED   Barbiturates NONE DETECTED NONE DETECTED    Comment: (NOTE) DRUG SCREEN FOR MEDICAL PURPOSES ONLY.  IF CONFIRMATION IS NEEDED FOR ANY PURPOSE, NOTIFY LAB WITHIN 5 DAYS.  LOWEST DETECTABLE LIMITS FOR URINE DRUG SCREEN Drug Class                     Cutoff (ng/mL) Amphetamine and metabolites    1000 Barbiturate and metabolites    200 Benzodiazepine                 200 Opiates and metabolites        300 Cocaine and metabolites        300 THC                            50 Performed  at [facility], [address] [place], [place] [address]   SARS Coronavirus 2 by RT PCR (hospital order, performed in [facility] hospital lab) *cepheid single result test* Anterior Nasal Swab     Status: None   Collection Time: 08/22/22  7:21 PM   Specimen: Anterior Nasal Swab  Result Value Ref Range   SARS Coronavirus 2 by RT PCR NEGATIVE NEGATIVE    Comment: (NOTE) SARS-CoV-2 target nucleic acids are NOT DETECTED.  The SARS-CoV-2 RNA is generally detectable in upper and lower respiratory specimens during the acute phase of infection. The lowest concentration of SARS-CoV-2 viral copies this assay can detect is 250 copies / mL. A negative result does not preclude SARS-CoV-2 infection and should not be used as the sole basis for treatment or other patient management decisions.  A negative result may occur with improper specimen collection / handling, submission of specimen other than nasopharyngeal swab, presence of viral mutation(s) within the areas targeted by this assay, and inadequate number of viral copies (<250 copies / mL). A negative result must be combined with clinical observations, patient history, and epidemiological information.  Fact Sheet for Patients:   RoadLapTop.co.za  Fact Sheet for Healthcare Providers: http://kim-miller.com/  This test is not yet approved or  cleared by  the [employer] and has been authorized for detection and/or diagnosis of SARS-CoV-2 by FDA under an Emergency Use Authorization (EUA).  This EUA will remain in effect (meaning this test can be used) for the duration of the COVID-19 declaration under Section 564(b)(1) of the Act, 21 U.S.C. section 360bbb-3(b)(1), unless the authorization is terminated or revoked sooner.  Performed at [facility], [address], [place], [place] [address]   Valproic acid level     Status: None   Collection Time:  08/22/22  7:36 PM  Result Value Ref Range   Valproic Acid Lvl 51 50.0 - 100.0 ug/mL    Comment: Performed at [facility], [address], [place], [place] [address]  Ethanol     Status: None   Collection Time: 08/22/22  7:38 PM  Result Value Ref Range   Alcohol, Ethyl (B) <10 <10 mg/dL    Comment: (NOTE) Lowest detectable limit for serum alcohol is 10 mg/dL.  For medical purposes only. Performed at [facility], [address], [place], [place] [address]    CT ABDOMEN PELVIS W CONTRAST  Result Date: 08/22/2022 CLINICAL DATA:  Abdominal pain. EXAM: CT ABDOMEN AND PELVIS WITH CONTRAST TECHNIQUE: Multidetector CT imaging of the abdomen and pelvis was performed using the standard protocol following bolus administration of intravenous contrast. RADIATION DOSE REDUCTION: This exam was performed according to the departmental dose-optimization program which includes automated exposure control, adjustment of the mA and/or kV according to patient size and/or use of iterative reconstruction technique. CONTRAST:  OMNIPAQUE IOHEXOL 300 MG/ML  SOLN COMPARISON:  None Available. FINDINGS: Lower chest: Lung bases clear.  No pericardial or pleural effusion. Hepatobiliary: Numerous bilobar hepatic cysts, largest in the lateral left lobe measuring 2.5 cm. Biliary ductal dilatation. Unremarkable gallbladder. Pancreas: Unremarkable. No pancreatic ductal dilatation or surrounding inflammatory changes. Spleen: Normal in size without focal abnormality. Adrenals/Urinary Tract: Left kidney cysts identified largest 2.5 cm. Intrarenal stones in the left measuring about 7 mm. No hydronephrosis. Adrenal glands unremarkable. Urinary bladder unremarkable. Stomach/Bowel: Stomach is within normal limits. Appendix appears normal. Diverticulosis identified descending and sigmoid colon. There is Peri diverticular fat stranding in the sigmoid mesentery consistent with diverticulitis. No  abscess or obstruction. Vascular/Lymphatic: Aortic atherosclerosis. No enlarged abdominal or pelvic lymph nodes. Reproductive: Uterus and bilateral adnexa are unremarkable. Other: No abdominal wall hernia or abnormality. No abdominopelvic ascites. Musculoskeletal: Lumbosacral degenerative changes. No acute osseous abnormalities. IMPRESSION: 1. Sigmoid diverticulitis.  No abscess or obstruction. 2. Numerous cysts in the liver as well as left kidney. 3. Nonobstructing left renal stones. Electronically Signed   By: Layla Maw M.D.   On: 08/22/2022 20:20   DG Chest Portable 1 View  Result Date: 08/22/2022 CLINICAL DATA:  Sepsis. EXAM: PORTABLE CHEST 1 VIEW COMPARISON:  Chest radiograph dated 08/22/2018. FINDINGS: No focal consolidation, pleural effusion, or pneumothorax. Stable cardiac silhouette. Atherosclerotic calcification of the aortic arch. Scoliosis. No acute osseous pathology. IMPRESSION: No active disease. Electronically Signed   By: Elgie Collard M.D.   On: 08/22/2022 19:45    Pending Labs Unresulted Labs (From admission, onward)     Start     Ordered   08/23/22 0809  Basic metabolic panel  Once,   R        08/23/22 0808   08/23/22 0809  Magnesium  Once,   R        08/23/22 0808   08/23/22 0808  CBC  Once,   R  08/23/22 0808   08/22/22 1914  Gastrointestinal Panel by PCR , Stool  (Gastrointestinal Panel by PCR, Stool                                                                                                                                                     **Does Not include CLOSTRIDIUM DIFFICILE testing. **If CDIFF testing is needed, place order from the "C Difficile Testing" order set.**)  Once,   URGENT        08/22/22 1913   08/22/22 1810  Lactic acid, plasma  (Lactic Acid)  Now then every 2 hours,   R (with STAT occurrences)      08/22/22 1809   Signed and Held  CBC  (enoxaparin (LOVENOX)    CrCl >/= 30 ml/min)  Once,   R       Comments: Baseline for enoxaparin  therapy IF NOT ALREADY DRAWN.  Notify MD if PLT < 100 K.    Signed and Held   Signed and Held  Creatinine, serum  (enoxaparin (LOVENOX)    CrCl >/= 30 ml/min)  Once,   R       Comments: Baseline for enoxaparin therapy IF NOT ALREADY DRAWN.    Signed and Held   Signed and Held  Creatinine, serum  (enoxaparin (LOVENOX)    CrCl >/= 30 ml/min)  Weekly,   R     Comments: while on enoxaparin therapy    Signed and Held   Signed and Held  HIV Antibody (routine testing w rflx)  (HIV Antibody (Routine testing w reflex) panel)  Once,   R        Signed and Held   Signed and Held  Comprehensive metabolic panel  Tomorrow morning,   R        Signed and Held   Signed and Held  CBC  Tomorrow morning,   R        Signed and Held   Signed and Held  Protime-INR  ONCE - STAT,   R        Signed and Held   Signed and Held  APTT  ONCE - STAT,   R        Signed and Held   Signed and Held  C Difficile Quick Screen w PCR reflex  (C Difficile quick screen w PCR reflex panel )  Once, for 24 hours,   R     References:    CDiff Information Tool   Signed and Held            Vitals/Pain Today's Vitals   08/23/22 0315 08/23/22 0625 08/23/22 0915 08/23/22 1223  BP: (!) 112/57 121/65 (!) 117/55 125/61  Pulse: 95 86 (!) 101 98  Resp: 18 18 16 14   Temp:  99.9 F (37.7 C) 99.3 F (37.4 C)  TempSrc:  Oral Oral   SpO2: 92% 94% 93% 90%  PainSc:        Isolation Precautions Enteric precautions (UV disinfection)  Medications Medications  atorvastatin (LIPITOR) tablet 20 mg (20 mg Oral Given 08/23/22 0952)  benztropine (COGENTIN) tablet 0.5 mg (0.5 mg Oral Given 08/23/22 0952)  divalproex (DEPAKOTE) DR tablet 500 mg (500 mg Oral Given 08/23/22 0952)  modafinil (PROVIGIL) tablet 200 mg (200 mg Oral Given 08/23/22 1226)  pantoprazole (PROTONIX) EC tablet 40 mg (40 mg Oral Given 08/23/22 0952)  temazepam (RESTORIL) capsule 30 mg (has no administration in time range)  traZODone (DESYREL) tablet 300 mg (300 mg Oral  Given 08/23/22 0124)  paliperidone (INVEGA) 24 hr tablet 6 mg (6 mg Oral Given 08/23/22 0125)  nicotine (NICODERM CQ - dosed in mg/24 hours) patch 21 mg (21 mg Transdermal Patch Applied 08/23/22 0953)  cefTRIAXone (ROCEPHIN) 2 g in sodium chloride 0.9 % 100 mL IVPB (has no administration in time range)  metroNIDAZOLE (FLAGYL) IVPB 500 mg (0 mg Intravenous Stopped 08/23/22 1149)  lactated ringers infusion ( Intravenous New Bag/Given 08/23/22 1001)  nystatin (MYCOSTATIN) 100000 UNIT/ML suspension 500,000 Units (500,000 Units Oral Not Given 08/23/22 1003)  acetaminophen (TYLENOL) tablet 650 mg (650 mg Oral Given 08/23/22 1226)  lactated ringers bolus 1,000 mL (0 mLs Intravenous Stopped 08/22/22 2123)  cefTRIAXone (ROCEPHIN) 2 g in sodium chloride 0.9 % 100 mL IVPB (0 g Intravenous Stopped 08/22/22 2032)  metroNIDAZOLE (FLAGYL) IVPB 500 mg (0 mg Intravenous Stopped 08/22/22 2123)  acetaminophen (TYLENOL) tablet 1,000 mg (1,000 mg Oral Given 08/22/22 1927)  iohexol (OMNIPAQUE) 300 MG/ML solution 100 mL (100 mLs Intravenous Contrast Given 08/22/22 1950)  LORazepam (ATIVAN) tablet 1 mg (1 mg Oral Given 08/23/22 0200)    Mobility walks     Focused Assessments     R Recommendations: See Admitting Provider Note  Report given to:   Additional Notes:

## 2022-08-23 NOTE — Progress Notes (Signed)
PROGRESS NOTE    Danielle Cox  WUJ:[id number] DOB: September 03, 1960 DOA: 08/22/2022 PCP: [name], MD   Brief Narrative: 62 year old with past medical history significant for anxiety, depression, bipolar, COPD presents for evaluation of diarrhea she has developed diarrhea since a month ago more than 10 episodes per day refractory to Flagyl, Imodium, Lomotil and Bentyl.  Initially stool culture were negative.  She developed fever at day of admission at 102, report 15 pounds weight loss.   Assessment & Plan:   Principal Problem:   Sigmoid diverticulitis Active Problems:   SIRS (systemic inflammatory response syndrome) (HCC)   Diverticulitis  1-Sepsis secondary to sigmoid diverticulitis:  -Patient presented with diarrhea, fever 101, tachycardia heart rate 110, tachypnea, white blood cell 13.  CT abdomen and pelvis: Sigmoid diverticulitis.  No abscess or obstruction. Numerous cysts in the liver as well as left kidney. Non-obstructing left renal stones. -Outpatient  workup: Ova and parasites negative, stool culture negative -Continue with IV ceftriaxone and flagyl.  -GI pathogen ordered. C diff not accepted by lab stool was not watery.  IV fluids.  Start full liquid diet.   History of anxiety, depression, bipolar Continue with Cogentin, Depakote, Provigil, Invega, temazepam, trazodone   Hyperlipidemia: -On statins.   GERD: -on PPI.   History of COPD: -PRN nebulizer.     Estimated body mass index is 29.56 kg/m as calculated from the following:   Height as of 08/20/22: 5' 5.5" (1.664 m).   Weight as of 08/20/22: 81.8 kg.   DVT prophylaxis: SCD Code Status: Full code Family Communication: Care discussed with patient.  Disposition Plan:  Status is: Inpatient Remains inpatient appropriate because: management of sigmoid diverticulitis.     Consultants:  None  Procedures:  None  Antimicrobials:    Subjective: She is feeling better today,. No BM yet. . Report  abdominal pain lower quadrants   Objective: Vitals:   08/22/22 2251 08/23/22 0242 08/23/22 0315 08/23/22 0625  BP:   (!) 112/57 121/65  Pulse:   95 86  Resp:   18 18  Temp: 98.4 F (36.9 C) 99.3 F (37.4 C)  99.9 F (37.7 C)  TempSrc: Oral Oral  Oral  SpO2:   92% 94%   No intake or output data in the 24 hours ending 08/23/22 0800 There were no vitals filed for this visit.  Examination:  General exam: Appears calm and comfortable  Respiratory system: Clear to auscultation. Respiratory effort normal. Cardiovascular system: S1 & S2 heard, RRR. No JVD, murmurs, rubs, gallops or clicks. No pedal edema. Gastrointestinal system: Abdomen is nondistended, soft tender lower quadrant, soft.  Central nervous system: Alert and oriented. Extremities: Symmetric 5 x 5 power.    Data Reviewed: I have personally reviewed following labs and imaging studies  CBC: Recent Labs  Lab 08/22/22 1723  WBC 13.4*  HGB 13.9  HCT 41.5  MCV 91.8  PLT 182   Basic Metabolic Panel: Recent Labs  Lab 08/22/22 1723  NA 137  K 3.6  CL 104  CO2 24  GLUCOSE 102*  BUN 18  CREATININE 0.78  CALCIUM 8.6*   GFR: Estimated Creatinine Clearance: 79.8 mL/min (by C-G formula based on SCr of 0.78 mg/dL). Liver Function Tests: Recent Labs  Lab 08/22/22 1723  AST 14*  ALT 16  ALKPHOS 46  BILITOT 0.7  PROT 6.3*  ALBUMIN 3.3*   Recent Labs  Lab 08/22/22 1723  LIPASE 26   No results for input(s): "AMMONIA" in the last 168 hours.  Coagulation Profile: No results for input(s): "INR", "PROTIME" in the last 168 hours. Cardiac Enzymes: No results for input(s): "CKTOTAL", "CKMB", "CKMBINDEX", "TROPONINI" in the last 168 hours. BNP (last 3 results) No results for input(s): "PROBNP" in the last 8760 hours. HbA1C: No results for input(s): "HGBA1C" in the last 72 hours. CBG: No results for input(s): "GLUCAP" in the last 168 hours. Lipid Profile: No results for input(s): "CHOL", "HDL", "LDLCALC",  "TRIG", "CHOLHDL", "LDLDIRECT" in the last 72 hours. Thyroid Function Tests: No results for input(s): "TSH", "T4TOTAL", "FREET4", "T3FREE", "THYROIDAB" in the last 72 hours. Anemia Panel: No results for input(s): "VITAMINB12", "FOLATE", "FERRITIN", "TIBC", "IRON", "RETICCTPCT" in the last 72 hours. Sepsis Labs: Recent Labs  Lab 08/22/22 1829  LATICACIDVEN 1.3    Recent Results (from the past 240 hour(s))  SARS Coronavirus 2 by RT PCR (hospital order, performed in [facility] hospital lab) *cepheid single result test* Anterior Nasal Swab     Status: None   Collection Time: 08/22/22  7:21 PM   Specimen: Anterior Nasal Swab  Result Value Ref Range Status   SARS Coronavirus 2 by RT PCR NEGATIVE NEGATIVE Final    Comment: (NOTE) SARS-CoV-2 target nucleic acids are NOT DETECTED.  The SARS-CoV-2 RNA is generally detectable in upper and lower respiratory specimens during the acute phase of infection. The lowest concentration of SARS-CoV-2 viral copies this assay can detect is 250 copies / mL. A negative result does not preclude SARS-CoV-2 infection and should not be used as the sole basis for treatment or other patient management decisions.  A negative result may occur with improper specimen collection / handling, submission of specimen other than nasopharyngeal swab, presence of viral mutation(s) within the areas targeted by this assay, and inadequate number of viral copies (<250 copies / mL). A negative result must be combined with clinical observations, patient history, and epidemiological information.  Fact Sheet for Patients:   RoadLapTop.co.za  Fact Sheet for Healthcare Providers: http://kim-miller.com/  This test is not yet approved or  cleared by the [place] FDA and has been authorized for detection and/or diagnosis of SARS-CoV-2 by FDA under an Emergency Use Authorization (EUA).  This EUA will remain in effect (meaning this  test can be used) for the duration of the COVID-19 declaration under Section 564(b)(1) of the Act, 21 U.S.C. section 360bbb-3(b)(1), unless the authorization is terminated or revoked sooner.  Performed at [facility], [address] [place], [place] [address]          Radiology Studies: CT ABDOMEN PELVIS W CONTRAST  Result Date: 08/22/2022 CLINICAL DATA:  Abdominal pain. EXAM: CT ABDOMEN AND PELVIS WITH CONTRAST TECHNIQUE: Multidetector CT imaging of the abdomen and pelvis was performed using the standard protocol following bolus administration of intravenous contrast. RADIATION DOSE REDUCTION: This exam was performed according to the departmental dose-optimization program which includes automated exposure control, adjustment of the mA and/or kV according to patient size and/or use of iterative reconstruction technique. CONTRAST:  OMNIPAQUE IOHEXOL 300 MG/ML  SOLN COMPARISON:  None Available. FINDINGS: Lower chest: Lung bases clear.  No pericardial or pleural effusion. Hepatobiliary: Numerous bilobar hepatic cysts, largest in the lateral left lobe measuring 2.5 cm. Biliary ductal dilatation. Unremarkable gallbladder. Pancreas: Unremarkable. No pancreatic ductal dilatation or surrounding inflammatory changes. Spleen: Normal in size without focal abnormality. Adrenals/Urinary Tract: Left kidney cysts identified largest 2.5 cm. Intrarenal stones in the left measuring about 7 mm. No hydronephrosis. Adrenal glands unremarkable. Urinary bladder unremarkable. Stomach/Bowel: Stomach is within  normal limits. Appendix appears normal. Diverticulosis identified descending and sigmoid colon. There is Peri diverticular fat stranding in the sigmoid mesentery consistent with diverticulitis. No abscess or obstruction. Vascular/Lymphatic: Aortic atherosclerosis. No enlarged abdominal or pelvic lymph nodes. Reproductive: Uterus and bilateral adnexa are unremarkable. Other: No abdominal wall  hernia or abnormality. No abdominopelvic ascites. Musculoskeletal: Lumbosacral degenerative changes. No acute osseous abnormalities. IMPRESSION: 1. Sigmoid diverticulitis.  No abscess or obstruction. 2. Numerous cysts in the liver as well as left kidney. 3. Nonobstructing left renal stones. Electronically Signed   By: Layla Maw M.D.   On: 08/22/2022 20:20   DG Chest Portable 1 View  Result Date: 08/22/2022 CLINICAL DATA:  Sepsis. EXAM: PORTABLE CHEST 1 VIEW COMPARISON:  Chest radiograph dated 08/22/2018. FINDINGS: No focal consolidation, pleural effusion, or pneumothorax. Stable cardiac silhouette. Atherosclerotic calcification of the aortic arch. Scoliosis. No acute osseous pathology. IMPRESSION: No active disease. Electronically Signed   By: Elgie Collard M.D.   On: 08/22/2022 19:45        Scheduled Meds:  atorvastatin  20 mg Oral Daily   benztropine  0.5 mg Oral Daily   divalproex  500 mg Oral BID   modafinil  200 mg Oral BID WC   nicotine  21 mg Transdermal Daily   paliperidone  6 mg Oral Daily   pantoprazole  40 mg Oral Daily   traZODone  300 mg Oral QHS   Continuous Infusions:  cefTRIAXone (ROCEPHIN)  IV     lactated ringers 150 mL/hr at 08/22/22 2125   metronidazole       LOS: 1 day    Time spent: 35 Minutes    Tajee Savant A [address], MD [facility]   If 7PM-7AM, please contact night-coverage [email or web address]  08/23/2022, 8:00 AM

## 2022-08-24 ENCOUNTER — Telehealth: Payer: Self-pay | Admitting: Family Medicine

## 2022-08-24 DIAGNOSIS — F17200 Nicotine dependence, unspecified, uncomplicated: Secondary | ICD-10-CM | POA: Diagnosis not present

## 2022-08-24 DIAGNOSIS — F3160 Bipolar disorder, current episode mixed, unspecified: Secondary | ICD-10-CM | POA: Diagnosis not present

## 2022-08-24 DIAGNOSIS — F419 Anxiety disorder, unspecified: Secondary | ICD-10-CM | POA: Diagnosis not present

## 2022-08-24 DIAGNOSIS — K5732 Diverticulitis of large intestine without perforation or abscess without bleeding: Secondary | ICD-10-CM | POA: Diagnosis not present

## 2022-08-24 DIAGNOSIS — G4700 Insomnia, unspecified: Secondary | ICD-10-CM | POA: Diagnosis not present

## 2022-08-24 LAB — COMPREHENSIVE METABOLIC PANEL
ALT: 31 U/L (ref 0–44)
AST: 28 U/L (ref 15–41)
Albumin: 2.9 g/dL — ABNORMAL LOW (ref 3.5–5.0)
Alkaline Phosphatase: 46 U/L (ref 38–126)
Anion gap: 10 (ref 5–15)
BUN: 7 mg/dL (ref 6–20)
CO2: 24 mmol/L (ref 22–32)
Calcium: 8.5 mg/dL — ABNORMAL LOW (ref 8.9–10.3)
Chloride: 105 mmol/L (ref 98–111)
Creatinine, Ser: 0.54 mg/dL (ref 0.44–1.00)
GFR, Estimated: >60 mL/min (ref >60)
Glucose, Bld: 122 mg/dL — ABNORMAL HIGH (ref 70–99)
Potassium: 3.5 mmol/L (ref 3.5–5.1)
Sodium: 139 mmol/L (ref 135–145)
Total Bilirubin: 0.4 mg/dL (ref 0.3–1.2)
Total Protein: 6.1 g/dL — ABNORMAL LOW (ref 6.5–8.1)

## 2022-08-24 MED ORDER — POTASSIUM CHLORIDE CRYS ER 20 MEQ PO TBCR
40.0000 meq | EXTENDED_RELEASE_TABLET | Freq: Once | ORAL | Status: AC
  Administered 2022-08-24: 40 meq via ORAL
  Filled 2022-08-24: qty 2

## 2022-08-24 MED ORDER — VANCOMYCIN HCL 125 MG PO CAPS
125.0000 mg | ORAL_CAPSULE | Freq: Four times a day (QID) | ORAL | Status: DC
  Administered 2022-08-24 – 2022-08-31 (×28): 125 mg via ORAL
  Filled 2022-08-24 (×32): qty 1

## 2022-08-24 MED ORDER — LACTATED RINGERS IV BOLUS
500.0000 mL | Freq: Once | INTRAVENOUS | Status: AC
  Administered 2022-08-24: 500 mL via INTRAVENOUS

## 2022-08-24 NOTE — Plan of Care (Signed)

## 2022-08-24 NOTE — Progress Notes (Signed)
Per Dr Sunnie Nielsen patient was tested for c.diff at [facility] prior to admission (on 08/23/22) and per patient's husband the result was "positive for c.diff". Will place on enteric precautions. And Lynnda Child (infection prevention) has been notified. Chivon Lepage, Yancey Flemings, RN

## 2022-08-24 NOTE — Plan of Care (Signed)
°  Problem: Fluid Volume: °Goal: Hemodynamic stability will improve °Outcome: Progressing °  °Problem: Clinical Measurements: °Goal: Diagnostic test results will improve °Outcome: Progressing °Goal: Signs and symptoms of infection will decrease °Outcome: Progressing °  °

## 2022-08-24 NOTE — Progress Notes (Signed)
Transition of Care [facility]) - Inpatient Brief Assessment   Patient Details  Name: Danielle Cox MRN: [id number] Date of Birth: 24-Sep-1960  Transition of Care [facility]) CM/SW Contact:    Larrie Kass, LCSW Phone Number: 08/24/2022, 12:32 PM   Clinical Narrative:  Transition of Care Department [facility]) has reviewed patient and no TOC needs have been identified at this time. We will continue to monitor patient advancement through interdisciplinary progression rounds. If new patient transition needs arise, please place a TOC consult.  Transition of Care Asessment: Insurance and Status: Insurance coverage has been reviewed Patient has primary care physician: Yes Home environment has been reviewed: yes Prior level of function:: independent Prior/Current Home Services: No current home services Social Determinants of Health Reivew: SDOH reviewed no interventions necessary Readmission risk has been reviewed: Yes Transition of care needs: no transition of care needs at this time

## 2022-08-24 NOTE — Progress Notes (Signed)
PROGRESS NOTE    Danielle Cox  JYN:[id number] DOB: September 01, 1960 DOA: 08/22/2022 PCP: [name], MD   Brief Narrative: 62 year old with past medical history significant for anxiety, depression, bipolar, COPD presents for evaluation of diarrhea she has developed diarrhea since a month ago more than 10 episodes per day refractory to Flagyl, Imodium, Lomotil and Bentyl.  Initially stool culture were negative.  She developed fever at day of admission at 102, report 15 pounds weight loss.   Assessment & Plan:   Principal Problem:   Sigmoid diverticulitis Active Problems:   SIRS (systemic inflammatory response syndrome) (HCC)   Diverticulitis  1-Sepsis secondary to Sigmoid Diverticulitis:  -Patient presented with diarrhea, fever 101, tachycardia heart rate 110, tachypnea, white blood cell 13.  CT abdomen and pelvis: Sigmoid diverticulitis.  No abscess or obstruction. Numerous cysts in the liver as well as left kidney. Non-obstructing left renal stones. -Outpatient  workup: Ova and parasites negative, stool culture negative -Continue with IV ceftriaxone and flagyl.  -GI pathogen ordered. C diff not accepted by lab stool was not watery.  GI pathogen positive for E coli/ enterotoxigenic.  Continue with IV ceftriaxone and Flagyl for diverticulitis.  Also will start Oral vancomycin. Patient was seen at [facility] and Had C diff Toxine Positive. I have request for results to be fax. Discussed with Laural Golden.  Still febrile, continue with fluids and antibiotics.   C diff Colitis. Start Orval Vancomycin.   History of anxiety, depression, bipolar Continue with Cogentin, Depakote, Provigil, Invega, temazepam, trazodone   Hyperlipidemia: -On statins.   GERD: -on PPI.   History of COPD: -PRN nebulizer.     Estimated body mass index is 30.56 kg/m as calculated from the following:   Height as of this encounter: 5\' 5"  (1.651 m).   Weight as of this encounter: 83.3 kg.   DVT  prophylaxis: SCD Code Status: Full code Family Communication: Care discussed with patient. Husband over phone Disposition Plan:  Status is: Inpatient Remains inpatient appropriate because: management of sigmoid diverticulitis.     Consultants:  None  Procedures:  None  Antimicrobials:    Subjective: She report stool is soft now, denies worsening Abdominal pain. Pain some what improved.  She was still febrile last night.   Objective: Vitals:   08/23/22 2149 08/24/22 0104 08/24/22 0353 08/24/22 1200  BP: 118/72  128/75 107/62  Pulse: (!) 102  93 84  Resp: 20  18 18   Temp: (!) 100.5 F (38.1 C) (!) 101.2 F (38.4 C) 100.3 F (37.9 C) 99.8 F (37.7 C)  TempSrc: Oral Oral Oral Oral  SpO2: 95%  95% 95%  Weight:      Height:        Intake/Output Summary (Last 24 hours) at 08/24/2022 1226 Last data filed at 08/24/2022 0600 Gross per 24 hour  Intake 2196.58 ml  Output --  Net 2196.58 ml   Filed Weights   08/23/22 1737  Weight: 83.3 kg    Examination:  General exam: NAD Respiratory system: CTA Cardiovascular system: S 1, S 2 RRR Gastrointestinal system: BS present, soft, nt Central nervous system: Alert, oriented.  Extremities: no edema    Data Reviewed: I have personally reviewed following labs and imaging studies  CBC: Recent Labs  Lab 08/22/22 1723 08/23/22 1451  WBC 13.4* 10.8*  HGB 13.9 12.7  HCT 41.5 39.3  MCV 91.8 94.0  PLT 182 148*   Basic Metabolic Panel: Recent Labs  Lab 08/22/22 1723 08/23/22 1451 08/24/22 0856  NA 137 138 139  K 3.6 3.1* 3.5  CL 104 107 105  CO2 24 24 24   GLUCOSE 102* 148* 122*  BUN 18 11 7   CREATININE 0.78 0.61 0.54  CALCIUM 8.6* 8.3* 8.5*  MG  --  2.0  --    GFR: Estimated Creatinine Clearance: 79.7 mL/min (by C-G formula based on SCr of 0.54 mg/dL). Liver Function Tests: Recent Labs  Lab 08/22/22 1723 08/24/22 0856  AST 14* 28  ALT 16 31  ALKPHOS 46 46  BILITOT 0.7 0.4  PROT 6.3* 6.1*  ALBUMIN  3.3* 2.9*   Recent Labs  Lab 08/22/22 1723  LIPASE 26   No results for input(s): "AMMONIA" in the last 168 hours. Coagulation Profile: Recent Labs  Lab 08/23/22 1451  INR 1.2   Cardiac Enzymes: No results for input(s): "CKTOTAL", "CKMB", "CKMBINDEX", "TROPONINI" in the last 168 hours. BNP (last 3 results) No results for input(s): "PROBNP" in the last 8760 hours. HbA1C: No results for input(s): "HGBA1C" in the last 72 hours. CBG: No results for input(s): "GLUCAP" in the last 168 hours. Lipid Profile: No results for input(s): "CHOL", "HDL", "LDLCALC", "TRIG", "CHOLHDL", "LDLDIRECT" in the last 72 hours. Thyroid Function Tests: No results for input(s): "TSH", "T4TOTAL", "FREET4", "T3FREE", "THYROIDAB" in the last 72 hours. Anemia Panel: No results for input(s): "VITAMINB12", "FOLATE", "FERRITIN", "TIBC", "IRON", "RETICCTPCT" in the last 72 hours. Sepsis Labs: Recent Labs  Lab 08/22/22 1829 08/23/22 1438  LATICACIDVEN 1.3 1.7    Recent Results (from the past 240 hour(s))  Blood culture (routine x 2)     Status: None (Preliminary result)   Collection Time: 08/22/22  6:29 PM   Specimen: BLOOD  Result Value Ref Range Status   Specimen Description   Final    BLOOD RIGHT ANTECUBITAL Performed at [facility], [address], [place], [place] [address]    Special Requests   Final    BOTTLES DRAWN AEROBIC AND ANAEROBIC Blood Culture adequate volume Performed at [facility], [address] [place], [place] [address]    Culture   Final    NO GROWTH 2 DAYS Performed at [facility], [address], [place], [place] [address]    Report Status PENDING  Incomplete  Blood culture (routine x 2)     Status: None (Preliminary result)   Collection Time: 08/22/22  6:30 PM   Specimen: BLOOD  Result Value Ref Range Status   Specimen Description   Final    BLOOD LEFT ANTECUBITAL Performed at [facility], [address] [place], [place] [address]    Special Requests   Final    BOTTLES DRAWN AEROBIC AND ANAEROBIC Blood Culture results may not be optimal due to an excessive volume of blood received in culture bottles Performed at [facility], [address] [place], [place] [address]    Culture   Final    NO GROWTH 2 DAYS Performed at [facility], [address] [place], [place] [address]    Report Status PENDING  Incomplete  SARS Coronavirus 2 by RT PCR (hospital order, performed in [facility] hospital lab) *cepheid single result test* Anterior Nasal Swab     Status: None   Collection Time: 08/22/22  7:21 PM   Specimen: Anterior Nasal Swab  Result Value Ref Range Status   SARS Coronavirus 2 by RT PCR NEGATIVE NEGATIVE Final    Comment: (NOTE) SARS-CoV-2 target nucleic acids are NOT  DETECTED.  The SARS-CoV-2 RNA is generally detectable in upper and lower respiratory specimens during the acute phase of infection. The lowest concentration of SARS-CoV-2 viral copies this assay can detect is 250 copies / mL. A negative result does not preclude SARS-CoV-2 infection and should not be used as the sole basis for treatment or other patient management decisions.  A negative result may occur with improper specimen collection / handling, submission of specimen other than nasopharyngeal swab, presence of viral mutation(s) within the areas targeted by this assay, and inadequate number of viral copies (<250 copies / mL). A negative result must be combined with clinical observations, patient history, and epidemiological information.  Fact Sheet for Patients:   RoadLapTop.co.za  Fact Sheet for Healthcare Providers: http://kim-miller.com/  This test is not yet approved or  cleared by the [place] FDA and has been authorized for detection and/or diagnosis of SARS-CoV-2 by FDA under an Emergency Use Authorization (EUA).  This  EUA will remain in effect (meaning this test can be used) for the duration of the COVID-19 declaration under Section 564(b)(1) of the Act, 21 U.S.C. section 360bbb-3(b)(1), unless the authorization is terminated or revoked sooner.  Performed at [facility], [address] [place], [place] [address]   Gastrointestinal Panel by PCR , Stool     Status: Abnormal   Collection Time: 08/23/22 12:24 PM   Specimen: Stool  Result Value Ref Range Status   Campylobacter species NOT DETECTED NOT DETECTED Final   Plesimonas shigelloides NOT DETECTED NOT DETECTED Final   Salmonella species NOT DETECTED NOT DETECTED Final   Yersinia enterocolitica NOT DETECTED NOT DETECTED Final   Vibrio species NOT DETECTED NOT DETECTED Final   Vibrio cholerae NOT DETECTED NOT DETECTED Final   Enteroaggregative E coli (EAEC) NOT DETECTED NOT DETECTED Final   Enteropathogenic E coli (EPEC) DETECTED (A) NOT DETECTED Final    Comment: RESULT CALLED TO, READ BACK BY AND VERIFIED WITH: JAKE RIMANDO @0621  08/24/22 MJU    Enterotoxigenic E coli (ETEC) DETECTED (A) NOT DETECTED Final    Comment: RESULT CALLED TO, READ BACK BY AND VERIFIED WITH: JAKE RIMANDO @0621  08/24/22 MJU    Shiga like toxin producing E coli (STEC) NOT DETECTED NOT DETECTED Final   Shigella/Enteroinvasive E coli (EIEC) NOT DETECTED NOT DETECTED Final   Cryptosporidium NOT DETECTED NOT DETECTED Final   Cyclospora cayetanensis NOT DETECTED NOT DETECTED Final   Entamoeba histolytica NOT DETECTED NOT DETECTED Final   Giardia lamblia NOT DETECTED NOT DETECTED Final   Adenovirus F40/41 NOT DETECTED NOT DETECTED Final   Astrovirus NOT DETECTED NOT DETECTED Final   Norovirus GI/GII NOT DETECTED NOT DETECTED Final   Rotavirus A NOT DETECTED NOT DETECTED Final   Sapovirus (I, II, IV, and V) NOT DETECTED NOT DETECTED Final    Comment: Performed at [facility], [address] [place], [place] [address]         Radiology  Studies: CT ABDOMEN PELVIS W CONTRAST  Result Date: 08/22/2022 CLINICAL DATA:  Abdominal pain. EXAM: CT ABDOMEN AND PELVIS WITH CONTRAST TECHNIQUE: Multidetector CT imaging of the abdomen and pelvis was performed using the standard protocol following bolus administration of intravenous contrast. RADIATION DOSE REDUCTION: This exam was performed according to the departmental dose-optimization program which includes automated exposure control, adjustment of the mA and/or kV according to patient size and/or use of iterative reconstruction technique. CONTRAST:  OMNIPAQUE IOHEXOL 300 MG/ML  SOLN COMPARISON:  None Available. FINDINGS: Lower chest: Lung bases  clear.  No pericardial or pleural effusion. Hepatobiliary: Numerous bilobar hepatic cysts, largest in the lateral left lobe measuring 2.5 cm. Biliary ductal dilatation. Unremarkable gallbladder. Pancreas: Unremarkable. No pancreatic ductal dilatation or surrounding inflammatory changes. Spleen: Normal in size without focal abnormality. Adrenals/Urinary Tract: Left kidney cysts identified largest 2.5 cm. Intrarenal stones in the left measuring about 7 mm. No hydronephrosis. Adrenal glands unremarkable. Urinary bladder unremarkable. Stomach/Bowel: Stomach is within normal limits. Appendix appears normal. Diverticulosis identified descending and sigmoid colon. There is Peri diverticular fat stranding in the sigmoid mesentery consistent with diverticulitis. No abscess or obstruction. Vascular/Lymphatic: Aortic atherosclerosis. No enlarged abdominal or pelvic lymph nodes. Reproductive: Uterus and bilateral adnexa are unremarkable. Other: No abdominal wall hernia or abnormality. No abdominopelvic ascites. Musculoskeletal: Lumbosacral degenerative changes. No acute osseous abnormalities. IMPRESSION: 1. Sigmoid diverticulitis.  No abscess or obstruction. 2. Numerous cysts in the liver as well as left kidney. 3. Nonobstructing left renal stones. Electronically Signed    By: Layla Maw M.D.   On: 08/22/2022 20:20   DG Chest Portable 1 View  Result Date: 08/22/2022 CLINICAL DATA:  Sepsis. EXAM: PORTABLE CHEST 1 VIEW COMPARISON:  Chest radiograph dated 08/22/2018. FINDINGS: No focal consolidation, pleural effusion, or pneumothorax. Stable cardiac silhouette. Atherosclerotic calcification of the aortic arch. Scoliosis. No acute osseous pathology. IMPRESSION: No active disease. Electronically Signed   By: Elgie Collard M.D.   On: 08/22/2022 19:45        Scheduled Meds:  atorvastatin  20 mg Oral Daily   benztropine  0.5 mg Oral Daily   divalproex  500 mg Oral BID   enoxaparin (LOVENOX) injection  40 mg Subcutaneous Q24H   feeding supplement  1 Container Oral TID BM   modafinil  200 mg Oral BID WC   nicotine  21 mg Transdermal Daily   nystatin  5 mL Oral QID   paliperidone  6 mg Oral Daily   pantoprazole  40 mg Oral Daily   sodium chloride flush  3 mL Intravenous Q12H   traZODone  300 mg Oral QHS   Continuous Infusions:  cefTRIAXone (ROCEPHIN)  IV 2 g (08/23/22 1948)   lactated ringers     lactated ringers 100 mL/hr at 08/24/22 0931   metronidazole 500 mg (08/24/22 0935)     LOS: 2 days    Time spent: 35 Minutes    Danielle Cox A Addysyn Fern, MD [facility]   If 7PM-7AM, please contact night-coverage [email or web address]  08/24/2022, 12:26 PM

## 2022-08-24 NOTE — Telephone Encounter (Signed)
Pts husband states his wife was diagnosed with E-Coli and C-diff.  Per his Dr and his symptoms he was told he probably has the same diagnosis. Pt would like a call back to talk about the issues.

## 2022-08-24 NOTE — Telephone Encounter (Signed)
Patient need an ED F/U with PCP

## 2022-08-25 ENCOUNTER — Inpatient Hospital Stay (HOSPITAL_COMMUNITY): Payer: Medicare PPO

## 2022-08-25 DIAGNOSIS — K5732 Diverticulitis of large intestine without perforation or abscess without bleeding: Secondary | ICD-10-CM | POA: Diagnosis not present

## 2022-08-25 LAB — BASIC METABOLIC PANEL
Anion gap: 10 (ref 5–15)
BUN: 5 mg/dL — ABNORMAL LOW (ref 6–20)
CO2: 27 mmol/L (ref 22–32)
Calcium: 8.3 mg/dL — ABNORMAL LOW (ref 8.9–10.3)
Chloride: 104 mmol/L (ref 98–111)
Creatinine, Ser: 0.5 mg/dL (ref 0.44–1.00)
GFR, Estimated: >60 mL/min (ref >60)
Glucose, Bld: 107 mg/dL — ABNORMAL HIGH (ref 70–99)
Potassium: 3.5 mmol/L (ref 3.5–5.1)
Sodium: 141 mmol/L (ref 135–145)

## 2022-08-25 LAB — CBC
HCT: 36.7 % (ref 36.0–46.0)
Hemoglobin: 11.9 g/dL — ABNORMAL LOW (ref 12.0–15.0)
MCH: 30.1 pg (ref 26.0–34.0)
MCHC: 32.4 g/dL (ref 30.0–36.0)
MCV: 92.7 fL (ref 80.0–100.0)
Platelets: 144 10*3/uL — ABNORMAL LOW (ref 150–400)
RBC: 3.96 MIL/uL (ref 3.87–5.11)
RDW: 14.6 % (ref 11.5–15.5)
WBC: 8.2 10*3/uL (ref 4.0–10.5)
nRBC: 0 % (ref 0.0–0.2)

## 2022-08-25 LAB — URINALYSIS, ROUTINE W REFLEX MICROSCOPIC
Bilirubin Urine: NEGATIVE
Glucose, UA: NEGATIVE mg/dL
Hgb urine dipstick: NEGATIVE
Ketones, ur: 5 mg/dL — AB
Leukocytes,Ua: NEGATIVE
Nitrite: NEGATIVE
Protein, ur: NEGATIVE mg/dL
Specific Gravity, Urine: 1.011 (ref 1.005–1.030)
pH: 8 (ref 5.0–8.0)

## 2022-08-25 MED ORDER — SIMETHICONE 80 MG PO CHEW
80.0000 mg | CHEWABLE_TABLET | Freq: Once | ORAL | Status: AC
  Administered 2022-08-25: 80 mg via ORAL
  Filled 2022-08-25: qty 1

## 2022-08-25 MED ORDER — TAMSULOSIN HCL 0.4 MG PO CAPS
0.4000 mg | ORAL_CAPSULE | Freq: Every day | ORAL | Status: DC
  Administered 2022-08-25 – 2022-08-31 (×7): 0.4 mg via ORAL
  Filled 2022-08-25 (×7): qty 1

## 2022-08-25 MED ORDER — POTASSIUM CHLORIDE CRYS ER 20 MEQ PO TBCR
40.0000 meq | EXTENDED_RELEASE_TABLET | Freq: Once | ORAL | Status: AC
  Administered 2022-08-25: 40 meq via ORAL
  Filled 2022-08-25: qty 2

## 2022-08-25 MED ORDER — SALINE SPRAY 0.65 % NA SOLN
1.0000 | NASAL | Status: DC | PRN
  Administered 2022-08-25: 1 via NASAL
  Filled 2022-08-25: qty 44

## 2022-08-25 NOTE — Progress Notes (Signed)
PROGRESS NOTE    Danielle Cox  ZOX:[id number] DOB: 10-29-1960 DOA: 08/22/2022 PCP: [name], MD   Brief Narrative: 62 year old with past medical history significant for anxiety, depression, bipolar, COPD presents for evaluation of diarrhea she has developed diarrhea since a month ago more than 10 episodes per day refractory to Flagyl, Imodium, Lomotil and Bentyl.  Initially stool culture were negative.  She developed fever at day of admission at 102, report 15 pounds weight loss.   Assessment & Plan:   Principal Problem:   Sigmoid diverticulitis Active Problems:   SIRS (systemic inflammatory response syndrome) (HCC)   Diverticulitis  1-Sepsis secondary to Sigmoid Diverticulitis:  -Patient presented with diarrhea, fever 101, tachycardia heart rate 110, tachypnea, white blood cell 13.  CT abdomen and pelvis: Sigmoid diverticulitis.  No abscess or obstruction. Numerous cysts in the liver as well as left kidney. Non-obstructing left renal stones. -Outpatient  workup: Ova and parasites negative, stool culture negative -GI pathogen ordered. C diff not accepted by lab stool was not watery.  -GI pathogen positive for E coli/ enterotoxigenic.  -Continue with IV ceftriaxone and Flagyl for diverticulitis.  -Started  Oral vancomycin. Patient was seen at [facility] and Had C diff Toxine Positive. I have request for results to be fax. Discussed with Laural Golden.  Report some improvement of abdominal pain, would like to try soft diet. Diarrhea improved.    C diff Colitis. Started  Orval Vancomycin.   History of anxiety, depression, bipolar Continue with Cogentin, Depakote, Provigil, Invega, temazepam, trazodone   Hyperlipidemia: -On statins.   GERD: -on PPI.   History of COPD: -PRN nebulizer.     Estimated body mass index is 30.56 kg/m as calculated from the following:   Height as of this encounter: 5\' 5"  (1.651 m).   Weight as of this encounter: 83.3 kg.   DVT prophylaxis:  SCD Code Status: Full code Family Communication: Care discussed with patient. Husband over phone Disposition Plan:  Status is: Inpatient Remains inpatient appropriate because: management of sigmoid diverticulitis.     Consultants:  None  Procedures:  None  Antimicrobials:    Subjective: She is still feeling poorly. Report improvement of abdominal pain and diarrhea.   Objective: Vitals:   08/24/22 1820 08/24/22 1911 08/24/22 2003 08/25/22 0447  BP:   105/67 126/65  Pulse:   79 84  Resp:   18 20  Temp: 100.1 F (37.8 C) 99.7 F (37.6 C) 99.5 F (37.5 C) 98.8 F (37.1 C)  TempSrc: Oral Oral Oral Oral  SpO2:   94% 95%  Weight:      Height:        Intake/Output Summary (Last 24 hours) at 08/25/2022 1239 Last data filed at 08/25/2022 1000 Gross per 24 hour  Intake 3694.23 ml  Output --  Net 3694.23 ml   Filed Weights   08/23/22 1737  Weight: 83.3 kg    Examination:  General exam: NAD Respiratory system: CTA Cardiovascular system: S 1, S  2 RRR Gastrointestinal system: BS present, soft, nt Central nervous system: Alert, follows command Extremities: No edema    Data Reviewed: I have personally reviewed following labs and imaging studies  CBC: Recent Labs  Lab 08/22/22 1723 08/23/22 1451 08/25/22 0926  WBC 13.4* 10.8* 8.2  HGB 13.9 12.7 11.9*  HCT 41.5 39.3 36.7  MCV 91.8 94.0 92.7  PLT 182 148* 144*   Basic Metabolic Panel: Recent Labs  Lab 08/22/22 1723 08/23/22 1451 08/24/22 0856 08/25/22 0926  NA  137 138 139 141  K 3.6 3.1* 3.5 3.5  CL 104 107 105 104  CO2 24 24 24 27   GLUCOSE 102* 148* 122* 107*  BUN 18 11 7  5*  CREATININE 0.78 0.61 0.54 0.50  CALCIUM 8.6* 8.3* 8.5* 8.3*  MG  --  2.0  --   --    GFR: Estimated Creatinine Clearance: 79.7 mL/min (by C-G formula based on SCr of 0.5 mg/dL). Liver Function Tests: Recent Labs  Lab 08/22/22 1723 08/24/22 0856  AST 14* 28  ALT 16 31  ALKPHOS 46 46  BILITOT 0.7 0.4  PROT 6.3*  6.1*  ALBUMIN 3.3* 2.9*   Recent Labs  Lab 08/22/22 1723  LIPASE 26   No results for input(s): "AMMONIA" in the last 168 hours. Coagulation Profile: Recent Labs  Lab 08/23/22 1451  INR 1.2   Cardiac Enzymes: No results for input(s): "CKTOTAL", "CKMB", "CKMBINDEX", "TROPONINI" in the last 168 hours. BNP (last 3 results) No results for input(s): "PROBNP" in the last 8760 hours. HbA1C: No results for input(s): "HGBA1C" in the last 72 hours. CBG: No results for input(s): "GLUCAP" in the last 168 hours. Lipid Profile: No results for input(s): "CHOL", "HDL", "LDLCALC", "TRIG", "CHOLHDL", "LDLDIRECT" in the last 72 hours. Thyroid Function Tests: No results for input(s): "TSH", "T4TOTAL", "FREET4", "T3FREE", "THYROIDAB" in the last 72 hours. Anemia Panel: No results for input(s): "VITAMINB12", "FOLATE", "FERRITIN", "TIBC", "IRON", "RETICCTPCT" in the last 72 hours. Sepsis Labs: Recent Labs  Lab 08/22/22 1829 08/23/22 1438  LATICACIDVEN 1.3 1.7    Recent Results (from the past 240 hour(s))  Blood culture (routine x 2)     Status: None (Preliminary result)   Collection Time: 08/22/22  6:29 PM   Specimen: BLOOD  Result Value Ref Range Status   Specimen Description   Final    BLOOD RIGHT ANTECUBITAL Performed at [facility], [address] [place], [place] [address]    Special Requests   Final    BOTTLES DRAWN AEROBIC AND ANAEROBIC Blood Culture adequate volume Performed at [facility], [address], [place], [place] [address]    Culture   Final    NO GROWTH 3 DAYS Performed at [facility], [address], [place], [place] [address]    Report Status PENDING  Incomplete  Blood culture (routine x 2)     Status: None (Preliminary result)   Collection Time: 08/22/22  6:30 PM   Specimen: BLOOD  Result Value Ref Range Status   Specimen Description   Final    BLOOD LEFT ANTECUBITAL Performed at [facility],  [address] [place], [place] [address]    Special Requests   Final    BOTTLES DRAWN AEROBIC AND ANAEROBIC Blood Culture results may not be optimal due to an excessive volume of blood received in culture bottles Performed at [facility], [address] [place], [place] [address]    Culture   Final    NO GROWTH 3 DAYS Performed at [facility], [address] [place], [place] [address]    Report Status PENDING  Incomplete  SARS Coronavirus 2 by RT PCR (hospital order, performed in [facility] hospital lab) *cepheid single result test* Anterior Nasal Swab     Status: None   Collection Time: 08/22/22  7:21 PM   Specimen: Anterior Nasal Swab  Result Value Ref Range Status   SARS Coronavirus 2 by RT PCR NEGATIVE NEGATIVE Final  Comment: (NOTE) SARS-CoV-2 target nucleic acids are NOT DETECTED.  The SARS-CoV-2 RNA is generally detectable in upper and lower respiratory specimens during the acute phase of infection. The lowest concentration of SARS-CoV-2 viral copies this assay can detect is 250 copies / mL. A negative result does not preclude SARS-CoV-2 infection and should not be used as the sole basis for treatment or other patient management decisions.  A negative result may occur with improper specimen collection / handling, submission of specimen other than nasopharyngeal swab, presence of viral mutation(s) within the areas targeted by this assay, and inadequate number of viral copies (<250 copies / mL). A negative result must be combined with clinical observations, patient history, and epidemiological information.  Fact Sheet for Patients:   RoadLapTop.co.za  Fact Sheet for Healthcare Providers: http://kim-miller.com/  This test is not yet approved or  cleared by the [place] FDA and has been authorized for detection and/or diagnosis of SARS-CoV-2 by FDA under an Emergency Use Authorization (EUA).   This EUA will remain in effect (meaning this test can be used) for the duration of the COVID-19 declaration under Section 564(b)(1) of the Act, 21 U.S.C. section 360bbb-3(b)(1), unless the authorization is terminated or revoked sooner.  Performed at [facility], [address], [place], [place] [address]   Gastrointestinal Panel by PCR , Stool     Status: Abnormal   Collection Time: 08/23/22 12:24 PM   Specimen: Stool  Result Value Ref Range Status   Campylobacter species NOT DETECTED NOT DETECTED Final   Plesimonas shigelloides NOT DETECTED NOT DETECTED Final   Salmonella species NOT DETECTED NOT DETECTED Final   Yersinia enterocolitica NOT DETECTED NOT DETECTED Final   Vibrio species NOT DETECTED NOT DETECTED Final   Vibrio cholerae NOT DETECTED NOT DETECTED Final   Enteroaggregative E coli (EAEC) NOT DETECTED NOT DETECTED Final   Enteropathogenic E coli (EPEC) DETECTED (A) NOT DETECTED Final    Comment: RESULT CALLED TO, READ BACK BY AND VERIFIED WITH: JAKE RIMANDO @0621  08/24/22 MJU    Enterotoxigenic E coli (ETEC) DETECTED (A) NOT DETECTED Final    Comment: RESULT CALLED TO, READ BACK BY AND VERIFIED WITH: JAKE RIMANDO @0621  08/24/22 MJU    Shiga like toxin producing E coli (STEC) NOT DETECTED NOT DETECTED Final   Shigella/Enteroinvasive E coli (EIEC) NOT DETECTED NOT DETECTED Final   Cryptosporidium NOT DETECTED NOT DETECTED Final   Cyclospora cayetanensis NOT DETECTED NOT DETECTED Final   Entamoeba histolytica NOT DETECTED NOT DETECTED Final   Giardia lamblia NOT DETECTED NOT DETECTED Final   Adenovirus F40/41 NOT DETECTED NOT DETECTED Final   Astrovirus NOT DETECTED NOT DETECTED Final   Norovirus GI/GII NOT DETECTED NOT DETECTED Final   Rotavirus A NOT DETECTED NOT DETECTED Final   Sapovirus (I, II, IV, and V) NOT DETECTED NOT DETECTED Final    Comment: Performed at [facility], [address] [place], [place] [address]          Radiology Studies: No results found.      Scheduled Meds:  atorvastatin  20 mg Oral Daily   benztropine  0.5 mg Oral Daily   divalproex  500 mg Oral BID   enoxaparin (LOVENOX) injection  40 mg Subcutaneous Q24H   feeding supplement  1 Container Oral TID BM   modafinil  200 mg Oral BID WC   nicotine  21 mg Transdermal Daily   nystatin  5 mL Oral QID   paliperidone  6 mg Oral Daily  pantoprazole  40 mg Oral Daily   sodium chloride flush  3 mL Intravenous Q12H   traZODone  300 mg Oral QHS   vancomycin  125 mg Oral QID   Continuous Infusions:  cefTRIAXone (ROCEPHIN)  IV Stopped (08/24/22 2028)   metronidazole Stopped (08/25/22 0959)     LOS: 3 days    Time spent: 35 Minutes    Zarah Carbon A Hazle Ogburn, MD [facility]   If 7PM-7AM, please contact night-coverage [email or web address]  08/25/2022, 12:39 PM

## 2022-08-25 NOTE — Progress Notes (Signed)
PHARMACY - PHYSICIAN COMMUNICATION CRITICAL VALUE ALERT - BLOOD CULTURE IDENTIFICATION (BCID)  Danielle Cox is an 62 y.o. female who presented to [facility] on 08/22/2022 with a chief complaint of diarrhea and was admitted for acute diverticulitis.   Assessment:   8/8 Blood culture: 1 of 4 bottles (anaerobic bottle): Gram positive rods (no BCID ran)  Name of physician (or Provider) Contacted: Jolene Schimke  Current antibiotics:  -Ceftriaxone 2gm IV every 24 hours;  -Metronidazole 500mg  IV every 12 hours;  -Vancomycin ORAL 125mg  four times daily  Changes to prescribed antibiotics recommended:  Likely a contaminant- no changes needed   Danielle Cox 08/25/2022  7:51 PM

## 2022-08-25 NOTE — Plan of Care (Signed)

## 2022-08-26 ENCOUNTER — Inpatient Hospital Stay (HOSPITAL_COMMUNITY): Payer: Medicare PPO

## 2022-08-26 ENCOUNTER — Encounter (HOSPITAL_COMMUNITY): Payer: Self-pay | Admitting: Family Medicine

## 2022-08-26 DIAGNOSIS — K529 Noninfective gastroenteritis and colitis, unspecified: Secondary | ICD-10-CM | POA: Diagnosis present

## 2022-08-26 DIAGNOSIS — J42 Unspecified chronic bronchitis: Secondary | ICD-10-CM

## 2022-08-26 DIAGNOSIS — Z8619 Personal history of other infectious and parasitic diseases: Secondary | ICD-10-CM | POA: Insufficient documentation

## 2022-08-26 DIAGNOSIS — K5732 Diverticulitis of large intestine without perforation or abscess without bleeding: Secondary | ICD-10-CM | POA: Diagnosis not present

## 2022-08-26 DIAGNOSIS — K319 Disease of stomach and duodenum, unspecified: Secondary | ICD-10-CM

## 2022-08-26 HISTORY — DX: Disease of stomach and duodenum, unspecified: K31.9

## 2022-08-26 HISTORY — DX: Personal history of other infectious and parasitic diseases: Z86.19

## 2022-08-26 LAB — CBC
HCT: 38.9 % (ref 36.0–46.0)
Hemoglobin: 12.7 g/dL (ref 12.0–15.0)
MCH: 30.1 pg (ref 26.0–34.0)
MCHC: 32.6 g/dL (ref 30.0–36.0)
MCV: 92.2 fL (ref 80.0–100.0)
Platelets: 167 10*3/uL (ref 150–400)
RBC: 4.22 MIL/uL (ref 3.87–5.11)
RDW: 14.6 % (ref 11.5–15.5)
WBC: 9.1 10*3/uL (ref 4.0–10.5)
nRBC: 0 % (ref 0.0–0.2)

## 2022-08-26 LAB — BASIC METABOLIC PANEL
Anion gap: 11 (ref 5–15)
BUN: 5 mg/dL — ABNORMAL LOW (ref 6–20)
CO2: 26 mmol/L (ref 22–32)
Calcium: 8.5 mg/dL — ABNORMAL LOW (ref 8.9–10.3)
Chloride: 104 mmol/L (ref 98–111)
Creatinine, Ser: 0.55 mg/dL (ref 0.44–1.00)
GFR, Estimated: >60 mL/min (ref >60)
Glucose, Bld: 105 mg/dL — ABNORMAL HIGH (ref 70–99)
Potassium: 4.1 mmol/L (ref 3.5–5.1)
Sodium: 141 mmol/L (ref 135–145)

## 2022-08-26 MED ORDER — HYDROMORPHONE HCL 1 MG/ML IJ SOLN
0.5000 mg | INTRAMUSCULAR | Status: DC | PRN
  Administered 2022-08-30: 1 mg via INTRAVENOUS
  Administered 2022-08-31: 2 mg via INTRAVENOUS
  Filled 2022-08-26: qty 1
  Filled 2022-08-26: qty 2
  Filled 2022-08-26: qty 1

## 2022-08-26 MED ORDER — SIMETHICONE 40 MG/0.6ML PO SUSP
80.0000 mg | Freq: Four times a day (QID) | ORAL | Status: DC | PRN
Start: 2022-08-26 — End: 2022-08-26

## 2022-08-26 MED ORDER — IOHEXOL 300 MG/ML  SOLN
100.0000 mL | Freq: Once | INTRAMUSCULAR | Status: AC | PRN
  Administered 2022-08-26: 100 mL via INTRAVENOUS

## 2022-08-26 MED ORDER — SODIUM CHLORIDE 0.9 % IV SOLN
2.0000 g | Freq: Three times a day (TID) | INTRAVENOUS | Status: DC
  Administered 2022-08-26 – 2022-08-27 (×3): 2 g via INTRAVENOUS
  Filled 2022-08-26 (×3): qty 12.5

## 2022-08-26 MED ORDER — METHOCARBAMOL 500 MG PO TABS
1000.0000 mg | ORAL_TABLET | Freq: Four times a day (QID) | ORAL | Status: DC | PRN
  Administered 2022-08-30: 1000 mg via ORAL
  Filled 2022-08-26 (×2): qty 2

## 2022-08-26 MED ORDER — ENSURE PRE-SURGERY PO LIQD
296.0000 mL | Freq: Once | ORAL | Status: DC
Start: 2022-08-26 — End: 2022-08-26

## 2022-08-26 MED ORDER — METHOCARBAMOL 1000 MG/10ML IJ SOLN: 1000.0000 mg | Freq: Four times a day (QID) | INTRAVENOUS | Status: DC | PRN

## 2022-08-26 MED ORDER — ORAL CARE MOUTH RINSE: 15.0000 mL | OROMUCOSAL | Status: DC | PRN

## 2022-08-26 MED ORDER — SODIUM CHLORIDE (PF) 0.9 % IJ SOLN
INTRAMUSCULAR | Status: AC
  Filled 2022-08-26: qty 50

## 2022-08-26 MED ORDER — LACTATED RINGERS IV SOLN: INTRAVENOUS | Status: DC

## 2022-08-26 MED ORDER — MENTHOL 3 MG MT LOZG: 1.0000 | LOZENGE | OROMUCOSAL | Status: DC | PRN

## 2022-08-26 MED ORDER — MAGIC MOUTHWASH: 15.0000 mL | Freq: Four times a day (QID) | ORAL | Status: DC | PRN

## 2022-08-26 MED ORDER — IOHEXOL 9 MG/ML PO SOLN
500.0000 mL | ORAL | Status: AC
  Administered 2022-08-26 (×2): 500 mL via ORAL

## 2022-08-26 MED ORDER — PHENOL 1.4 % MT LIQD: 2.0000 | OROMUCOSAL | Status: DC | PRN

## 2022-08-26 MED ORDER — SODIUM CHLORIDE 0.9 % IV SOLN: INTRAVENOUS | Status: DC | PRN

## 2022-08-26 MED ORDER — METRONIDAZOLE 500 MG/100ML IV SOLN
500.0000 mg | Freq: Two times a day (BID) | INTRAVENOUS | Status: DC
  Administered 2022-08-26 – 2022-08-30 (×8): 500 mg via INTRAVENOUS
  Filled 2022-08-26 (×8): qty 100

## 2022-08-26 MED ORDER — TRAMADOL HCL 50 MG PO TABS
50.0000 mg | ORAL_TABLET | Freq: Four times a day (QID) | ORAL | Status: DC | PRN
  Administered 2022-08-30: 50 mg via ORAL
  Filled 2022-08-26: qty 2

## 2022-08-26 MED ORDER — HYDROCODONE-ACETAMINOPHEN 5-325 MG PO TABS
1.0000 | ORAL_TABLET | Freq: Four times a day (QID) | ORAL | Status: DC | PRN
  Administered 2022-08-26: 1 via ORAL
  Filled 2022-08-26: qty 1

## 2022-08-26 MED ORDER — ALUM & MAG HYDROXIDE-SIMETH 200-200-20 MG/5ML PO SUSP: 30.0000 mL | Freq: Four times a day (QID) | ORAL | Status: DC | PRN

## 2022-08-26 MED ORDER — SIMETHICONE 80 MG PO CHEW
80.0000 mg | CHEWABLE_TABLET | Freq: Four times a day (QID) | ORAL | Status: DC | PRN
  Administered 2022-08-26: 80 mg via ORAL
  Filled 2022-08-26: qty 1

## 2022-08-26 MED ORDER — ONDANSETRON HCL 4 MG/2ML IJ SOLN
4.0000 mg | Freq: Four times a day (QID) | INTRAMUSCULAR | Status: DC | PRN
  Administered 2022-08-27 – 2022-08-28 (×3): 4 mg via INTRAVENOUS
  Filled 2022-08-26 (×3): qty 2

## 2022-08-26 MED ORDER — ACETAMINOPHEN 500 MG PO TABS
1000.0000 mg | ORAL_TABLET | Freq: Four times a day (QID) | ORAL | Status: DC
  Administered 2022-08-26 – 2022-08-31 (×16): 1000 mg via ORAL
  Filled 2022-08-26 (×16): qty 2

## 2022-08-26 MED ORDER — LACTATED RINGERS IV BOLUS: 1000.0000 mL | Freq: Three times a day (TID) | INTRAVENOUS | Status: AC | PRN

## 2022-08-26 MED ORDER — PROCHLORPERAZINE EDISYLATE 10 MG/2ML IJ SOLN
5.0000 mg | INTRAMUSCULAR | Status: DC | PRN
  Administered 2022-08-27 – 2022-08-31 (×2): 10 mg via INTRAVENOUS
  Filled 2022-08-26 (×3): qty 2

## 2022-08-26 MED ORDER — SODIUM CHLORIDE 0.9 % IV SOLN: 8.0000 mg | Freq: Four times a day (QID) | INTRAVENOUS | Status: DC | PRN

## 2022-08-26 MED ORDER — IOHEXOL 9 MG/ML PO SOLN
ORAL | Status: AC
  Filled 2022-08-26: qty 1000

## 2022-08-26 NOTE — Plan of Care (Signed)

## 2022-08-26 NOTE — Consult Note (Signed)
Danielle Cox  11-May-1960 [id number]  CARE TEAM:  PCP: Danielle Dark, MD  Outpatient Care Team: Patient Care Team: Danielle Dark, MD as PCP - General (Family Medicine) Danielle Mews, DO as Consulting Physician (Family Medicine) Danielle Cox, DDS (Dental General Practice) Danielle Savage, NP as Nurse Practitioner Danielle Dallas, DO as Consulting Physician (Neurology) Care, [facility] (Pain Medicine) Danielle Asa, MD as Consulting Physician (Psychiatry) Danielle Isaacs, MD as Consulting Physician (Pain Medicine) Danielle Cox, [facility] (Inactive) as Pharmacist (Pharmacist) Danielle Penta, MD as Referring Physician (Gastroenterology)  Inpatient Treatment Team: Treatment Team:  Danielle Cory, MD Danielle Cox, NT Danielle Gilford, MD Danielle Cox, NT Danielle Martens, RN Danielle Camps, Md, MD Danielle Kinds, RN   This patient is a 61 y.o.female who presents today for surgical evaluation at the request of Danielle Danielle Cox.   Chief complaint / Reason for evaluation: Abnormal CAT scan.  29 year old woman with numerous medical issues including depression/anxiety/bipolar disorder/questionable personality disorder.  Smoker.  COPD.  Hyperlipidemia.  She struggled with a lot of chronic abdominal pain and chronic diarrhea.  Looks like she is followed by Danielle. Jason Cox with digestive diseases to the [facility].  Has had a lot of stool studies and workup.  Had a colonoscopy earlier this year that showed some colon polyps but no active colitis.  Had upper endoscopy which showed some gastropathy but was H. pylori negative and no definite gastritis.  Think she has been on the proton pump inhibitor.  For a month prior to admission she has been having diarrhea with loose bowel movements.  When treatment regimens of Flagyl antibiotics, antidiarrheal loperamide and Lomotil.  Antispasmodics such as dicyclomine.  Looks like her stool studies  have somewhat been underwhelming.   Patient came in with unintentional weight loss fevers and failure to thrive.  CT scan right suspicion of inflamed sigmoid colon and diverticulitis but no perforation or abscess.  She was admitted 8/7 and placed on IV ceftriaxone and metronidazole.  Patient seemed to slowly improve somewhat.  Outpatient workup positive for C. difficile placed on oral vancomycin 8/9.  No fevers leukocytosis spoke with some crampy abdominal pain.  Repeat CAT scan shows evidence of extraluminal air in the area of diverticulitis into the mesentery specially questionably along the vein.  Question of ischemic colitis or worsening diverticulitis surgical consultation requested.  ######################################################  Bowel rest with sips today.  Switch to more aggressive antibiotic regimen.  Usually we do piperacillin/tazobactam but given her allergy will switch to cefepime/metronidazole.  If that does not improve, ertapenem.  Follow clinically.  Consider Gastroenterology consultation for chronic diarrhea and Cdiff positivity and now with study showing enterotoxigenic E. coli.  Suspect they will table injury intervention until the perforated colon is more stabilized.  Try and hold off on any surgical intervention.  She may benefit from follow-up CAT scan in about 4 to 5 days to rule out abscess formation.  If clinically deteriorates, may need Hartmann resection  She may ultimately benefit from segmental resection of this problem area of her sigmoid colon since this most likely seems to be the source of her issues, she has worsened, she is relatively young..  Would be helpful to reach back out to the gastrologist to see if there is anything of concern on her colonoscopy.  What I gather report overall without more few adenomatous polyps and no obvious stricture inflammation or chronic diverticulitis.  Check nutritional labs.  If rapidly declining may need TPN but seems  less likely.  Smoking cessation to prevent further progression into recurrence.  Smokers have higher morbidity & mortality with surgical intervention  Assessment  Danielle Cox  62 y.o. female       Problem List:  Principal Problem:   Diverticulitis of colon with perforation Active Problems:   Nicotine dependence with current use   Essential hypertension   Personality disorder (HCC)   Fibromyalgia   COPD (chronic obstructive pulmonary disease) (HCC)   Seasonal allergic rhinitis due to pollen   Insulin resistance   Bipolar affective disorder (HCC)   Danielle Cox   SIRS (systemic inflammatory response syndrome) (HCC)   Adenomatous polyp of ascending colon   Tobacco use disorder   Insomnia, unspecified   Chronic diarrhea   History of Clostridioides difficile colitis   Gastropathy      I reviewed nursing notes, ED provider notes, Consultant [facility] notes, hospitalist notes, last 24 h vitals and pain scores, last 48 h intake and output, last 24 h labs and trends, and last 24 h imaging results. I have reviewed this patient's available data, including medical history, events of note, test results, etc as part of my evaluation.  A significant portion of that time was spent in counseling.  Care during the described time interval was provided by me.  This care required moderate level of medical decision making.  08/26/2022  Danielle Sportsman, MD, FACS, MASCRS Esophageal, Gastrointestinal & Colorectal Surgery Robotic and Minimally Invasive Surgery  [facility] [address] [place], [place] [address] [phone] Fax [phone] Main  CONTACT INFORMATION: Weekday (9AM-5PM): Call [facility] main office at [phone] Weeknight (5PM-9AM) or Weekend/Holiday: Check EPIC "Web Links" tab & use "AMION" (password " [email or web address]") for General Surgery [facility] coverage  Please, DO NOT use SecureChat  (it is not reliable  communication to reach operating surgeons & will lead to a delay in care).   Epic staff messaging available for outptient concerns needing 1-2 business day response.      08/26/2022      Past Medical History:  Diagnosis Date   Anxiety    Bipolar disorder (HCC)    Bronchitis, chronic (HCC)    COPD (chronic obstructive pulmonary disease) (HCC)    Depression    Personality disorder (HCC)     Past Surgical History:  Procedure Laterality Date   CARPAL TUNNEL RELEASE Right    CESAREAN SECTION  7, 30, 46   TONSILLECTOMY  age 31    Social History   Socioeconomic History   Marital status: Married    Spouse name: Onalee Hua   Number of children: 3   Years of education: Not on file   Highest education level: GED or equivalent  Occupational History   Occupation: disabled  Tobacco Use   Smoking status: Every Day    Current packs/day: 1.50    Average packs/day: 1.5 packs/day for 40.0 years (60.0 ttl pk-yrs)    Types: Cigarettes   Smokeless tobacco: Never  Vaping Use   Vaping status: Never Used  Substance and Sexual Activity   Alcohol use: No   Drug use: No   Sexual activity: Not Currently    Partners: Male    Birth control/protection: None  Other Topics Concern   Not on file  Social History Narrative   10/29/2012 [name] Yasamin was born and grew up in [place], [place]. She has 2  brothers and one sister. She reports that her childhood was "poor," and explains that she needs both financially and emotionally. She completed the 10th grade, then achieved her GED. She has been married twice. The first time at age 9, and that ended after 1 year. She is currently married to her second husband of 27 years. She has 2 sons and one daughter. She is currently unemployed and on disability for 2 years. She lives with her husband, her daughter, and one of her daughter's friends. She denies any legal difficulties. She reports that she is spiritual but not religious. Her hobbies include reading and  cross stitch. She reports that she has no social support network. 10/29/2012 [name]      Patient is right-handed.she lives with her husband ina one story home with a few steps to enter. She drinks decaff tea.  She is active around the home.   Drinks gatorade and propel  daily    Social Determinants of Health   Financial Resource Strain: Low Risk  (01/29/2022)   Overall Financial Resource Strain (CARDIA)    Difficulty of Paying Living Expenses: Not hard at all  Food Insecurity: No Food Insecurity (08/23/2022)   Hunger Vital Sign    Worried About Running Out of Food in the Last Year: Never true    Ran Out of Food in the Last Year: Never true  Transportation Needs: No Transportation Needs (08/23/2022)   PRAPARE - Administrator, Civil Service (Medical): No    Lack of Transportation (Non-Medical): No  Physical Activity: Insufficiently Active (01/29/2022)   Exercise Vital Sign    Days of Exercise per Week: 5 days    Minutes of Exercise per Session: 20 min  Stress: No Stress Concern Present (01/29/2022)   [employer] of Occupational Health - Occupational Stress Questionnaire    Feeling of Stress : Only a little  Social Connections: Moderately Isolated (01/29/2022)   Social Connection and Isolation Panel [NHANES]    Frequency of Communication with Friends and Family: More than three times a week    Frequency of Social Gatherings with Friends and Family: More than three times a week    Attends Religious Services: Never    Database administrator or Organizations: No    Attends Banker Meetings: Never    Marital Status: Married  Catering manager Violence: Not At Risk (08/23/2022)   Humiliation, Afraid, Rape, and Kick questionnaire    Fear of Current or Ex-Partner: No    Emotionally Abused: No    Physically Abused: No    Sexually Abused: No    Family History  Problem Relation Age of Onset   Cancer Mother    Depression Mother    Alcohol abuse Mother    Depression  Father    Alcohol abuse Father    Cancer Sister    Alcohol abuse Sister    Depression Sister    Alcohol abuse Brother    Depression Brother        overdose   Breast cancer Neg Hx     Current Facility-Administered Medications  Medication Dose Route Frequency Provider Last Rate Last Admin   acetaminophen (TYLENOL) tablet 1,000 mg  1,000 mg Oral Q6H Karie Soda, MD       acetaminophen (TYLENOL) tablet 650 mg  650 mg Oral Q6H PRN Regalado, Belkys A, MD   650 mg at 08/26/22 0205   albuterol (PROVENTIL) (2.5 MG/3ML) 0.083% nebulizer solution 2.5 mg  2.5 mg Nebulization Q6H  PRN Hugelmeyer, Alexis, DO       alum & mag hydroxide-simeth (MAALOX/MYLANTA) 200-200-20 MG/5ML suspension 30 mL  30 mL Oral Q6H PRN Karie Soda, MD       atorvastatin (LIPITOR) tablet 20 mg  20 mg Oral Daily Hugelmeyer, Alexis, DO   20 mg at 08/26/22 1610   benztropine (COGENTIN) tablet 0.5 mg  0.5 mg Oral Daily Hugelmeyer, Alexis, DO   0.5 mg at 08/26/22 9604   ceFEPIme (MAXIPIME) 2 g in sodium chloride 0.9 % 100 mL IVPB  2 g Intravenous Trixie Deis, MD       divalproex (DEPAKOTE) Danielle tablet 500 mg  500 mg Oral BID Hugelmeyer, Alexis, DO   500 mg at 08/26/22 0902   enoxaparin (LOVENOX) injection 40 mg  40 mg Subcutaneous Q24H Hugelmeyer, Alexis, DO   40 mg at 08/25/22 1718   feeding supplement (BOOST / RESOURCE BREEZE) liquid 1 Container  1 Container Oral TID BM Regalado, Belkys A, MD   1 Container at 08/26/22 1248   hydrALAZINE (APRESOLINE) injection 5 mg  5 mg Intravenous Q8H PRN Hugelmeyer, Alexis, DO       HYDROmorphone (DILAUDID) injection 0.5-2 mg  0.5-2 mg Intravenous Q2H PRN Karie Soda, MD       ipratropium (ATROVENT) nebulizer solution 0.5 mg  0.5 mg Nebulization Q6H PRN Hugelmeyer, Alexis, DO       lactated ringers bolus 1,000 mL  1,000 mL Intravenous Q8H PRN Karie Soda, MD       lactated ringers infusion   Intravenous Continuous Regalado, Belkys A, MD       magic mouthwash  15 mL Oral QID PRN  Karie Soda, MD       menthol-cetylpyridinium (CEPACOL) lozenge 3 mg  1 lozenge Oral PRN Karie Soda, MD       methocarbamol (ROBAXIN) 1,000 mg in dextrose 5 % 100 mL IVPB  1,000 mg Intravenous Q6H PRN Karie Soda, MD       methocarbamol (ROBAXIN) tablet 1,000 mg  1,000 mg Oral Q6H PRN Karie Soda, MD       metroNIDAZOLE (FLAGYL) IVPB 500 mg  500 mg Intravenous BID Karie Soda, MD       modafinil (PROVIGIL) tablet 200 mg  200 mg Oral BID WC Hugelmeyer, Alexis, DO   200 mg at 08/26/22 1247   morphine (PF) 2 MG/ML injection 2 mg  2 mg Intravenous Q4H PRN Hugelmeyer, Alexis, DO   2 mg at 08/25/22 2111   nicotine (NICODERM CQ - dosed in mg/24 hours) patch 21 mg  21 mg Transdermal Daily Hugelmeyer, Alexis, DO   21 mg at 08/26/22 0901   nystatin (MYCOSTATIN) 100000 UNIT/ML suspension 500,000 Units  5 mL Oral QID Regalado, Belkys A, MD   500,000 Units at 08/26/22 1247   ondansetron (ZOFRAN) injection 4 mg  4 mg Intravenous Q6H PRN Karie Soda, MD       Or   ondansetron (ZOFRAN) 8 mg in sodium chloride 0.9 % 50 mL IVPB  8 mg Intravenous Q6H PRN Karie Soda, MD       Oral care mouth rinse  15 mL Mouth Rinse PRN Regalado, Belkys A, MD       paliperidone (INVEGA) 24 hr tablet 6 mg  6 mg Oral Daily Hugelmeyer, Alexis, DO   6 mg at 08/26/22 0903   pantoprazole (PROTONIX) EC tablet 40 mg  40 mg Oral Daily Hugelmeyer, Alexis, DO   40 mg at 08/26/22 0903   phenol (CHLORASEPTIC) mouth spray  2 spray  2 spray Mouth/Throat PRN Karie Soda, MD       prochlorperazine (COMPAZINE) injection 5-10 mg  5-10 mg Intravenous Q4H PRN Karie Soda, MD       sodium chloride (OCEAN) 0.65 % nasal spray 1 spray  1 spray Each Nare PRN Regalado, Belkys A, MD   1 spray at 08/25/22 2230   sodium chloride flush (NS) 0.9 % injection 3 mL  3 mL Intravenous Q12H Hugelmeyer, Alexis, DO   3 mL at 08/26/22 0905   tamsulosin (FLOMAX) capsule 0.4 mg  0.4 mg Oral Daily Regalado, Belkys A, MD   0.4 mg at 08/26/22 0903   temazepam  (RESTORIL) capsule 30 mg  30 mg Oral QHS PRN Hugelmeyer, Alexis, DO   30 mg at 08/24/22 2135   traMADol (ULTRAM) tablet 50-100 mg  50-100 mg Oral Q6H PRN Karie Soda, MD       traZODone (DESYREL) tablet 300 mg  300 mg Oral QHS Hugelmeyer, Alexis, DO   300 mg at 08/25/22 2111   vancomycin (VANCOCIN) capsule 125 mg  125 mg Oral QID Regalado, Belkys A, MD   125 mg at 08/26/22 1255     Allergies  Allergen Reactions   Moxifloxacin Hcl Palpitations    REACTION: tackycardia   Penicillins Rash    Has patient had a PCN reaction causing immediate rash, facial/tongue/throat swelling, SOB or lightheadedness with hypotension: Yes Has patient had a PCN reaction causing severe rash involving mucus membranes or skin necrosis: No Has patient had a PCN reaction that required hospitalization: No Has patient had a PCN reaction occurring within the last 10 years: No If all of the above answers are "NO", then may proceed with Cephalosporin use.    Corticosteroids Other (See Comments)    Mania     BP 127/66 (BP Location: Right Arm)   Pulse 77   Temp 99.6 F (37.6 C) (Oral)   Resp 14   Ht 5\' 5"  (1.651 m)   Wt 83.3 kg   LMP 02/12/2016   SpO2 96%   BMI 30.56 kg/m     Results:   Labs: Results for orders placed or performed during the hospital encounter of 08/22/22 (from the past 48 hour(s))  CBC     Status: Abnormal   Collection Time: 08/25/22  9:26 AM  Result Value Ref Range   WBC 8.2 4.0 - 10.5 K/uL   RBC 3.96 3.87 - 5.11 MIL/uL   Hemoglobin 11.9 (L) 12.0 - 15.0 g/dL   HCT 19.5 09.3 - 26.7 %   MCV 92.7 80.0 - 100.0 fL   MCH 30.1 26.0 - 34.0 pg   MCHC 32.4 30.0 - 36.0 g/dL   RDW 12.4 58.0 - 99.8 %   Platelets 144 (L) 150 - 400 K/uL   nRBC 0.0 0.0 - 0.2 %    Comment: Performed at [facility], [address], [place], [place] [address]  Basic metabolic panel     Status: Abnormal   Collection Time: 08/25/22  9:26 AM  Result Value Ref Range   Sodium 141 135 - 145  mmol/L   Potassium 3.5 3.5 - 5.1 mmol/L   Chloride 104 98 - 111 mmol/L   CO2 27 22 - 32 mmol/L   Glucose, Bld 107 (H) 70 - 99 mg/dL    Comment: Glucose reference range applies only to samples taken after fasting for at least 8 hours.   BUN 5 (L) 6 - 20 mg/dL   Creatinine, Ser  0.50 0.44 - 1.00 mg/dL   Calcium 8.3 (L) 8.9 - 10.3 mg/dL   GFR, Estimated >21 >30 mL/min    Comment: (NOTE) Calculated using the CKD-EPI Creatinine Equation (2021)    Anion gap 10 5 - 15    Comment: Performed at [facility], [address], [place], [place] [address]  Urinalysis, Routine w reflex microscopic -Urine, Clean Catch     Status: Abnormal   Collection Time: 08/25/22  7:17 PM  Result Value Ref Range   Color, Urine YELLOW YELLOW   APPearance CLEAR CLEAR   Specific Gravity, Urine 1.011 1.005 - 1.030   pH 8.0 5.0 - 8.0   Glucose, UA NEGATIVE NEGATIVE mg/dL   Hgb urine dipstick NEGATIVE NEGATIVE   Bilirubin Urine NEGATIVE NEGATIVE   Ketones, ur 5 (A) NEGATIVE mg/dL   Protein, ur NEGATIVE NEGATIVE mg/dL   Nitrite NEGATIVE NEGATIVE   Leukocytes,Ua NEGATIVE NEGATIVE    Comment: Performed at [facility], [address], [place], [place] [address]  CBC     Status: None   Collection Time: 08/26/22  8:12 AM  Result Value Ref Range   WBC 9.1 4.0 - 10.5 K/uL   RBC 4.22 3.87 - 5.11 MIL/uL   Hemoglobin 12.7 12.0 - 15.0 g/dL   HCT 95.2 84.1 - 32.4 %   MCV 92.2 80.0 - 100.0 fL   MCH 30.1 26.0 - 34.0 pg   MCHC 32.6 30.0 - 36.0 g/dL   RDW 40.1 02.7 - 25.3 %   Platelets 167 150 - 400 K/uL   nRBC 0.0 0.0 - 0.2 %    Comment: Performed at [facility], [address], [place], [place] [address]  Basic metabolic panel     Status: Abnormal   Collection Time: 08/26/22  8:12 AM  Result Value Ref Range   Sodium 141 135 - 145 mmol/L   Potassium 4.1 3.5 - 5.1 mmol/L   Chloride 104 98 - 111 mmol/L   CO2 26 22 - 32 mmol/L   Glucose, Bld 105 (H) 70 - 99 mg/dL     Comment: Glucose reference range applies only to samples taken after fasting for at least 8 hours.   BUN 5 (L) 6 - 20 mg/dL   Creatinine, Ser 3.47 0.44 - 1.00 mg/dL   Calcium 8.5 (L) 8.9 - 10.3 mg/dL   GFR, Estimated >42 >59 mL/min    Comment: (NOTE) Calculated using the CKD-EPI Creatinine Equation (2021)    Anion gap 11 5 - 15    Comment: Performed at [facility], [address], [place], [place] [address]    Imaging / Studies: CT ABDOMEN PELVIS W CONTRAST  Result Date: 08/26/2022 CLINICAL DATA:  Inpatient. Abdominal pain, acute (Ped 0-17y). Recent diagnosis of sigmoid diverticulitis. EXAM: CT ABDOMEN AND PELVIS WITH CONTRAST TECHNIQUE: Multidetector CT imaging of the abdomen and pelvis was performed using the standard protocol following bolus administration of intravenous contrast. RADIATION DOSE REDUCTION: This exam was performed according to the departmental dose-optimization program which includes automated exposure control, adjustment of the mA and/or kV according to patient size and/or use of iterative reconstruction technique. CONTRAST:  OMNIPAQUE IOHEXOL 300 MG/ML  SOLN COMPARISON:  08/25/2022 abdominal radiograph. 08/22/2022 CT abdomen/pelvis. FINDINGS: Lower chest: Trace posterior bilateral pleural effusions, right greater than left, increased bilaterally. Similar trace pericardial effusion. Mild posterior bibasilar atelectasis. Healed lateral right eighth rib fracture. Hepatobiliary: Normal liver size. Simple 3.3 cm inferior left liver cyst with numerous  additional subcentimeter hypodense liver lesions scattered throughout the liver that are too small to characterize and unchanged. No appreciable new liver lesions. Normal gallbladder with no radiopaque cholelithiasis. No biliary ductal dilatation. Pancreas: Normal, with no mass or duct dilation. Spleen: Normal size. No mass. Adrenals/Urinary Tract: Normal adrenals. No hydronephrosis. Simple 2.1 cm posterior  lower left renal cyst with additional scattered subcentimeter hypodense left renal cortical lesions that are too small to characterize and are unchanged, for which no follow-up imaging is recommended. Normal bladder. Stomach/Bowel: Normal non-distended stomach. Normal caliber small bowel with no small bowel wall thickening. Oral contrast transits to the left colon. Right upper quadrant cecum with normal appendix. Marked sigmoid diverticulosis with wall thickening throughout the mid sigmoid colon with prominent pericolonic fat stranding and new pericolonic free air throughout the sigmoid mesentery, which appears to track along the sigmoid mesenteric veins into the inferior mesenteric vein on the coronal images (series 5/image 62). Inflammatory fat stranding extends throughout the anterior left paranephric retroperitoneum and inferiorly into the presacral space. No measurable pericolonic fluid collections. Vascular/Lymphatic: Atherosclerotic nonaneurysmal abdominal aorta. Patent portal, splenic, hepatic and renal veins. Mildly enlarged 1.4 cm porta hepatis node (series 3/image 34), stable. Newly mildly enlarged 1.7 cm portacaval node (series 3/image 37). Reproductive: Grossly normal uterus.  No adnexal mass. Other: No focal fluid collection. Musculoskeletal: No aggressive appearing focal osseous lesions. Moderate degenerative disc disease at L5-S1. IMPRESSION: 1. Unusual perforated acute sigmoid diverticulitis with new pericolonic free air tracking along the sigmoid mesenteric veins into the inferior mesenteric vein with worsened inflammatory fat stranding extending throughout the anterior left retroperitoneum and inferiorly into the presacral space. No measurable pericolonic fluid collections. Findings raise concern for development of ischemic colitis. 2. Trace posterior bilateral pleural effusions, right greater than left, increased bilaterally. Similar trace pericardial effusion. 3. Nonspecific mild porta hepatis  and portacaval lymphadenopathy, mildly increased. 4.  Aortic Atherosclerosis (ICD10-I70.0). Critical Value/emergent results were called by telephone at the time of interpretation on 08/26/2022 at 4:40 pm to provider [facility] , who verbally acknowledged these results. Electronically Signed   By: Delbert Phenix M.D.   On: 08/26/2022 16:45   DG Abd 1 View  Result Date: 08/25/2022 CLINICAL DATA:  Lower abdominal pain EXAM: ABDOMEN - 1 VIEW COMPARISON:  None Available. FINDINGS: No dilated large or small bowel. Small amount gas and stool in the rectum. No pathologic calcifications. No organomegaly. No acute osseous abnormality. IMPRESSION: 1. No acute findings. 2. Small amount of gas and stool in the rectum. Electronically Signed   By: Genevive Bi M.D.   On: 08/25/2022 16:58   CT ABDOMEN PELVIS W CONTRAST  Result Date: 08/22/2022 CLINICAL DATA:  Abdominal pain. EXAM: CT ABDOMEN AND PELVIS WITH CONTRAST TECHNIQUE: Multidetector CT imaging of the abdomen and pelvis was performed using the standard protocol following bolus administration of intravenous contrast. RADIATION DOSE REDUCTION: This exam was performed according to the departmental dose-optimization program which includes automated exposure control, adjustment of the mA and/or kV according to patient size and/or use of iterative reconstruction technique. CONTRAST:  OMNIPAQUE IOHEXOL 300 MG/ML  SOLN COMPARISON:  None Available. FINDINGS: Lower chest: Lung bases clear.  No pericardial or pleural effusion. Hepatobiliary: Numerous bilobar hepatic cysts, largest in the lateral left lobe measuring 2.5 cm. Biliary ductal dilatation. Unremarkable gallbladder. Pancreas: Unremarkable. No pancreatic ductal dilatation or surrounding inflammatory changes. Spleen: Normal in size without focal abnormality. Adrenals/Urinary Tract: Left kidney cysts identified largest 2.5 cm. Intrarenal stones in the left measuring about  7 mm. No hydronephrosis. Adrenal glands  unremarkable. Urinary bladder unremarkable. Stomach/Bowel: Stomach is within normal limits. Appendix appears normal. Diverticulosis identified descending and sigmoid colon. There is Peri diverticular fat stranding in the sigmoid mesentery consistent with diverticulitis. No abscess or obstruction. Vascular/Lymphatic: Aortic atherosclerosis. No enlarged abdominal or pelvic lymph nodes. Reproductive: Uterus and bilateral adnexa are unremarkable. Other: No abdominal wall hernia or abnormality. No abdominopelvic ascites. Musculoskeletal: Lumbosacral degenerative changes. No acute osseous abnormalities. IMPRESSION: 1. Sigmoid diverticulitis.  No abscess or obstruction. 2. Numerous cysts in the liver as well as left kidney. 3. Nonobstructing left renal stones. Electronically Signed   By: Layla Maw M.D.   On: 08/22/2022 20:20   DG Chest Portable 1 View  Result Date: 08/22/2022 CLINICAL DATA:  Sepsis. EXAM: PORTABLE CHEST 1 VIEW COMPARISON:  Chest radiograph dated 08/22/2018. FINDINGS: No focal consolidation, pleural effusion, or pneumothorax. Stable cardiac silhouette. Atherosclerotic calcification of the aortic arch. Scoliosis. No acute osseous pathology. IMPRESSION: No active disease. Electronically Signed   By: Elgie Collard M.D.   On: 08/22/2022 19:45    Medications / Allergies: per chart  Antibiotics: Anti-infectives (From admission, onward)    Start     Dose/Rate Route Frequency Ordered Stop   08/26/22 2200  metroNIDAZOLE (FLAGYL) IVPB 500 mg        500 mg 100 mL/hr over 60 Minutes Intravenous 2 times daily 08/26/22 1652 09/05/22 2159   08/26/22 1745  ceFEPIme (MAXIPIME) 2 g in sodium chloride 0.9 % 100 mL IVPB       Note to Pharmacy: Pharmacy may adjust dosing strength, schedule, rate of infusion, etc as needed to optimize therapy   2 g 200 mL/hr over 30 Minutes Intravenous Every 8 hours 08/26/22 1649 09/05/22 1359   08/24/22 1400  vancomycin (VANCOCIN) capsule 125 mg        125 mg Oral 4  times daily 08/24/22 1235 09/03/22 1359   08/23/22 2000  cefTRIAXone (ROCEPHIN) 2 g in sodium chloride 0.9 % 100 mL IVPB  Status:  Discontinued        2 g 200 mL/hr over 30 Minutes Intravenous Every 24 hours 08/23/22 0542 08/26/22 1649   08/23/22 1436  cefTRIAXone (ROCEPHIN) 2 g in sodium chloride 0.9 % 100 mL IVPB  Status:  Discontinued        2 g 200 mL/hr over 30 Minutes Intravenous Every 24 hours 08/23/22 1436 08/23/22 1442   08/23/22 1436  metroNIDAZOLE (FLAGYL) IVPB 500 mg  Status:  Discontinued        500 mg 100 mL/hr over 60 Minutes Intravenous Every 12 hours 08/23/22 1436 08/23/22 1441   08/23/22 0800  metroNIDAZOLE (FLAGYL) IVPB 500 mg  Status:  Discontinued        500 mg 100 mL/hr over 60 Minutes Intravenous Every 12 hours 08/23/22 0542 08/26/22 1652   08/22/22 1915  cefTRIAXone (ROCEPHIN) 2 g in sodium chloride 0.9 % 100 mL IVPB        2 g 200 mL/hr over 30 Minutes Intravenous  Once 08/22/22 1900 08/22/22 2032   08/22/22 1915  metroNIDAZOLE (FLAGYL) IVPB 500 mg        500 mg 100 mL/hr over 60 Minutes Intravenous  Once 08/22/22 1900 08/22/22 2123         Note: Portions of this report may have been transcribed using voice recognition software. Every effort was made to ensure accuracy; however, inadvertent computerized transcription errors may be present.   Any transcriptional errors that result from this  process are unintentional.    Danielle Sportsman, MD, FACS, MASCRS Esophageal, Gastrointestinal & Colorectal Surgery Robotic and Minimally Invasive Surgery  [facility] [address] [place], [place] [address] [phone] Fax [phone] Main  CONTACT INFORMATION: Weekday (9AM-5PM): Call [facility] main office at [phone] Weeknight (5PM-9AM) or Weekend/Holiday: Check EPIC "Web Links" tab & use "AMION" (password " [email or web address]") for General Surgery [facility] coverage  Please, DO NOT use SecureChat  (it is not  reliable communication to reach operating surgeons & will lead to a delay in care).   Epic staff messaging available for outptient concerns needing 1-2 business day response.       08/26/2022  5:19 PM

## 2022-08-26 NOTE — Progress Notes (Addendum)
PROGRESS NOTE    Danielle Cox  ONG:[id number] DOB: 1960/02/27 DOA: 08/22/2022 PCP: [name], MD   Brief Narrative: 62 year old with past medical history significant for anxiety, depression, bipolar, COPD presents for evaluation of diarrhea she has developed diarrhea since a month ago more than 10 episodes per day refractory to Flagyl, Imodium, Lomotil and Bentyl.  Initially stool culture were negative.  She developed fever at day of admission at 102, report 15 pounds weight loss.   Assessment & Plan:   Principal Problem:   Sigmoid diverticulitis Active Problems:   SIRS (systemic inflammatory response syndrome) (HCC)   Diverticulitis  1-Sepsis secondary to Sigmoid Diverticulitis:  -Patient presented with diarrhea, fever 101, tachycardia heart rate 110, tachypnea, white blood cell 13.  CT abdomen and pelvis: Sigmoid diverticulitis.  No abscess or obstruction. Numerous cysts in the liver as well as left kidney. Non-obstructing left renal stones. -Outpatient  workup: Ova and parasites negative, stool culture negative -GI pathogen ordered. C diff not accepted by lab stool was not watery.  -GI pathogen positive for E coli/ enterotoxigenic.  -Continue with IV ceftriaxone and Flagyl for diverticulitis.  -Started  Oral vancomycin. Patient was seen at [facility] and Had C diff Toxine Positive. I have request for results to be fax. Discussed with Laural Golden.  -develops worsening pressure abdominal pain yesterday afternoon, still with low grade fever. Plan to repeat CT abdomen.  Addendum; CT scan with perforated diverticulitis. Surgery consul;ted. IV antibiotics change to Cefepime plus Flagyl. Try to contact husband, didn't answer, patient relates to call him tomorrow.he is playing golf and wont be available until after 8 30 PM/.   C diff Colitis. Started  Orval Vancomycin.   History of anxiety, depression, bipolar Continue with Cogentin, Depakote, Provigil, Invega, temazepam,  trazodone   Hyperlipidemia: -On statins.   GERD: -on PPI.   History of COPD: -PRN nebulizer.   Difficulty Voiding.  Had episode of difficulty voiding yesterday.  Started on Flomax.  No issues today   Estimated body mass index is 30.56 kg/m as calculated from the following:   Height as of this encounter: 5\' 5"  (1.651 m).   Weight as of this encounter: 83.3 kg.   DVT prophylaxis: SCD Code Status: Full code Family Communication: Care discussed with patient. Husband over phone 8/09 Disposition Plan:  Status is: Inpatient Remains inpatient appropriate because: management of sigmoid diverticulitis.     Consultants:  None  Procedures:  None  Antimicrobials:    Subjective: She has some difficulty voiding yesterday. Doing well today in that regards.  She had some worsening pressure lower quadrant yesterday.  No BM since yesterday.  Objective: Vitals:   08/26/22 0200 08/26/22 0317 08/26/22 0431 08/26/22 0859  BP:  119/64 119/62 (!) 118/58  Pulse:  69 72 77  Resp:  20 20 18   Temp: (!) 100.5 F (38.1 C) 99.6 F (37.6 C) 99.1 F (37.3 C) 99 F (37.2 C)  TempSrc: Oral Oral Oral Oral  SpO2:  92% 93% 95%  Weight:      Height:        Intake/Output Summary (Last 24 hours) at 08/26/2022 1222 Last data filed at 08/26/2022 1000 Gross per 24 hour  Intake 924.14 ml  Output --  Net 924.14 ml   Filed Weights   08/23/22 1737  Weight: 83.3 kg    Examination:  General exam: NAD Respiratory system: CTA Cardiovascular system: S 1, S 2 RRR Gastrointestinal system: BS present, soft, nt Central nervous system: Alert, follows  command Extremities: No edema    Data Reviewed: I have personally reviewed following labs and imaging studies  CBC: Recent Labs  Lab 08/22/22 1723 08/23/22 1451 08/25/22 0926 08/26/22 0812  WBC 13.4* 10.8* 8.2 9.1  HGB 13.9 12.7 11.9* 12.7  HCT 41.5 39.3 36.7 38.9  MCV 91.8 94.0 92.7 92.2  PLT 182 148* 144* 167   Basic Metabolic  Panel: Recent Labs  Lab 08/22/22 1723 08/23/22 1451 08/24/22 0856 08/25/22 0926 08/26/22 0812  NA 137 138 139 141 141  K 3.6 3.1* 3.5 3.5 4.1  CL 104 107 105 104 104  CO2 24 24 24 27 26   GLUCOSE 102* 148* 122* 107* 105*  BUN 18 11 7  5* 5*  CREATININE 0.78 0.61 0.54 0.50 0.55  CALCIUM 8.6* 8.3* 8.5* 8.3* 8.5*  MG  --  2.0  --   --   --    GFR: Estimated Creatinine Clearance: 79.7 mL/min (by C-G formula based on SCr of 0.55 mg/dL). Liver Function Tests: Recent Labs  Lab 08/22/22 1723 08/24/22 0856  AST 14* 28  ALT 16 31  ALKPHOS 46 46  BILITOT 0.7 0.4  PROT 6.3* 6.1*  ALBUMIN 3.3* 2.9*   Recent Labs  Lab 08/22/22 1723  LIPASE 26   No results for input(s): "AMMONIA" in the last 168 hours. Coagulation Profile: Recent Labs  Lab 08/23/22 1451  INR 1.2   Cardiac Enzymes: No results for input(s): "CKTOTAL", "CKMB", "CKMBINDEX", "TROPONINI" in the last 168 hours. BNP (last 3 results) No results for input(s): "PROBNP" in the last 8760 hours. HbA1C: No results for input(s): "HGBA1C" in the last 72 hours. CBG: No results for input(s): "GLUCAP" in the last 168 hours. Lipid Profile: No results for input(s): "CHOL", "HDL", "LDLCALC", "TRIG", "CHOLHDL", "LDLDIRECT" in the last 72 hours. Thyroid Function Tests: No results for input(s): "TSH", "T4TOTAL", "FREET4", "T3FREE", "THYROIDAB" in the last 72 hours. Anemia Panel: No results for input(s): "VITAMINB12", "FOLATE", "FERRITIN", "TIBC", "IRON", "RETICCTPCT" in the last 72 hours. Sepsis Labs: Recent Labs  Lab 08/22/22 1829 08/23/22 1438  LATICACIDVEN 1.3 1.7    Recent Results (from the past 240 hour(s))  Blood culture (routine x 2)     Status: None (Preliminary result)   Collection Time: 08/22/22  6:29 PM   Specimen: BLOOD  Result Value Ref Range Status   Specimen Description   Final    BLOOD RIGHT ANTECUBITAL Performed at [facility], [address] [place], [place] [address]    Special  Requests   Final    BOTTLES DRAWN AEROBIC AND ANAEROBIC Blood Culture adequate volume Performed at [facility], [address] [place], [place] [address]    Culture  Setup Time   Final    GRAM POSITIVE RODS ANAEROBIC BOTTLE ONLY CRITICAL RESULT CALLED TO, READ BACK BY AND VERIFIED WITH: PHARMD AMY GREGORY [id number] AT 1948 BY EC    Culture   Final    GRAM POSITIVE RODS CULTURE REINCUBATED FOR BETTER GROWTH Performed at [facility], [address] [place], [place] [address]    Report Status PENDING  Incomplete  Blood culture (routine x 2)     Status: None (Preliminary result)   Collection Time: 08/22/22  6:30 PM   Specimen: BLOOD  Result Value Ref Range Status   Specimen Description   Final    BLOOD LEFT ANTECUBITAL Performed at [facility], [address], [place], [place] [address]    Special Requests  Final    BOTTLES DRAWN AEROBIC AND ANAEROBIC Blood Culture results may not be optimal due to an excessive volume of blood received in culture bottles Performed at [facility], [address] [place], [place] [address]    Culture  Setup Time   Final    GRAM POSITIVE RODS ANAEROBIC BOTTLE ONLY CRITICAL VALUE NOTED.  VALUE IS CONSISTENT WITH PREVIOUSLY REPORTED AND CALLED VALUE.    Culture   Final    GRAM POSITIVE RODS CULTURE REINCUBATED FOR BETTER GROWTH Performed at [facility], [address], [place], [place] [address]    Report Status PENDING  Incomplete  SARS Coronavirus 2 by RT PCR (hospital order, performed in [facility] hospital lab) *cepheid single result test* Anterior Nasal Swab     Status: None   Collection Time: 08/22/22  7:21 PM   Specimen: Anterior Nasal Swab  Result Value Ref Range Status   SARS Coronavirus 2 by RT PCR NEGATIVE NEGATIVE Final    Comment: (NOTE) SARS-CoV-2 target nucleic acids are NOT DETECTED.  The SARS-CoV-2 RNA is generally detectable in upper and  lower respiratory specimens during the acute phase of infection. The lowest concentration of SARS-CoV-2 viral copies this assay can detect is 250 copies / mL. A negative result does not preclude SARS-CoV-2 infection and should not be used as the sole basis for treatment or other patient management decisions.  A negative result may occur with improper specimen collection / handling, submission of specimen other than nasopharyngeal swab, presence of viral mutation(s) within the areas targeted by this assay, and inadequate number of viral copies (<250 copies / mL). A negative result must be combined with clinical observations, patient history, and epidemiological information.  Fact Sheet for Patients:   RoadLapTop.co.za  Fact Sheet for Healthcare Providers: http://kim-miller.com/  This test is not yet approved or  cleared by the [place] FDA and has been authorized for detection and/or diagnosis of SARS-CoV-2 by FDA under an Emergency Use Authorization (EUA).  This EUA will remain in effect (meaning this test can be used) for the duration of the COVID-19 declaration under Section 564(b)(1) of the Act, 21 U.S.C. section 360bbb-3(b)(1), unless the authorization is terminated or revoked sooner.  Performed at [facility], [address], [place], [place] [address]   Gastrointestinal Panel by PCR , Stool     Status: Abnormal   Collection Time: 08/23/22 12:24 PM   Specimen: Stool  Result Value Ref Range Status   Campylobacter species NOT DETECTED NOT DETECTED Final   Plesimonas shigelloides NOT DETECTED NOT DETECTED Final   Salmonella species NOT DETECTED NOT DETECTED Final   Yersinia enterocolitica NOT DETECTED NOT DETECTED Final   Vibrio species NOT DETECTED NOT DETECTED Final   Vibrio cholerae NOT DETECTED NOT DETECTED Final   Enteroaggregative E coli (EAEC) NOT DETECTED NOT DETECTED Final   Enteropathogenic E coli  (EPEC) DETECTED (A) NOT DETECTED Final    Comment: RESULT CALLED TO, READ BACK BY AND VERIFIED WITH: JAKE RIMANDO @0621  08/24/22 MJU    Enterotoxigenic E coli (ETEC) DETECTED (A) NOT DETECTED Final    Comment: RESULT CALLED TO, READ BACK BY AND VERIFIED WITH: JAKE RIMANDO @0621  08/24/22 MJU    Shiga like toxin producing E coli (STEC) NOT DETECTED NOT DETECTED Final   Shigella/Enteroinvasive E coli (EIEC) NOT DETECTED NOT DETECTED Final   Cryptosporidium NOT DETECTED NOT DETECTED Final   Cyclospora cayetanensis NOT DETECTED NOT DETECTED Final   Entamoeba histolytica NOT  DETECTED NOT DETECTED Final   Giardia lamblia NOT DETECTED NOT DETECTED Final   Adenovirus F40/41 NOT DETECTED NOT DETECTED Final   Astrovirus NOT DETECTED NOT DETECTED Final   Norovirus GI/GII NOT DETECTED NOT DETECTED Final   Rotavirus A NOT DETECTED NOT DETECTED Final   Sapovirus (I, II, IV, and V) NOT DETECTED NOT DETECTED Final    Comment: Performed at [facility], [address] [place], [place] [address]         Radiology Studies: DG Abd 1 View  Result Date: 08/25/2022 CLINICAL DATA:  Lower abdominal pain EXAM: ABDOMEN - 1 VIEW COMPARISON:  None Available. FINDINGS: No dilated large or small bowel. Small amount gas and stool in the rectum. No pathologic calcifications. No organomegaly. No acute osseous abnormality. IMPRESSION: 1. No acute findings. 2. Small amount of gas and stool in the rectum. Electronically Signed   By: Genevive Bi M.D.   On: 08/25/2022 16:58        Scheduled Meds:  atorvastatin  20 mg Oral Daily   benztropine  0.5 mg Oral Daily   divalproex  500 mg Oral BID   enoxaparin (LOVENOX) injection  40 mg Subcutaneous Q24H   feeding supplement  1 Container Oral TID BM   modafinil  200 mg Oral BID WC   nicotine  21 mg Transdermal Daily   nystatin  5 mL Oral QID   paliperidone  6 mg Oral Daily   pantoprazole  40 mg Oral Daily   sodium chloride flush  3 mL Intravenous Q12H    tamsulosin  0.4 mg Oral Daily   traZODone  300 mg Oral QHS   vancomycin  125 mg Oral QID   Continuous Infusions:  cefTRIAXone (ROCEPHIN)  IV 2 g (08/25/22 2021)   metronidazole 100 mL/hr at 08/26/22 1000     LOS: 4 days    Time spent: 35 Minutes    Ahni Bradwell A Renner Sebald, MD [facility]   If 7PM-7AM, please contact night-coverage [email or web address]  08/26/2022, 12:22 PM

## 2022-08-27 DIAGNOSIS — K5732 Diverticulitis of large intestine without perforation or abscess without bleeding: Secondary | ICD-10-CM | POA: Diagnosis not present

## 2022-08-27 LAB — BASIC METABOLIC PANEL
Anion gap: 10 (ref 5–15)
BUN: 7 mg/dL (ref 6–20)
CO2: 28 mmol/L (ref 22–32)
Calcium: 8.1 mg/dL — ABNORMAL LOW (ref 8.9–10.3)
Chloride: 103 mmol/L (ref 98–111)
Creatinine, Ser: 0.52 mg/dL (ref 0.44–1.00)
GFR, Estimated: >60 mL/min (ref >60)
Glucose, Bld: 109 mg/dL — ABNORMAL HIGH (ref 70–99)
Potassium: 3.4 mmol/L — ABNORMAL LOW (ref 3.5–5.1)
Sodium: 141 mmol/L (ref 135–145)

## 2022-08-27 MED ORDER — LACTATED RINGERS IV SOLN: INTRAVENOUS | Status: DC

## 2022-08-27 MED ORDER — ACETAMINOPHEN 10 MG/ML IV SOLN
1000.0000 mg | Freq: Once | INTRAVENOUS | Status: AC
  Administered 2022-08-27: 1000 mg via INTRAVENOUS
  Filled 2022-08-27: qty 100

## 2022-08-27 MED ORDER — SODIUM CHLORIDE 0.9 % IV BOLUS: 500.0000 mL | Freq: Once | INTRAVENOUS | Status: AC

## 2022-08-27 MED ORDER — SODIUM CHLORIDE 0.9 % IV SOLN
2.0000 g | Freq: Three times a day (TID) | INTRAVENOUS | Status: DC
  Administered 2022-08-27 – 2022-08-30 (×9): 2 g via INTRAVENOUS
  Filled 2022-08-27 (×9): qty 12.5

## 2022-08-27 MED ORDER — POTASSIUM CHLORIDE 10 MEQ/100ML IV SOLN
10.0000 meq | INTRAVENOUS | Status: AC
  Administered 2022-08-27 (×4): 10 meq via INTRAVENOUS
  Filled 2022-08-27 (×3): qty 100

## 2022-08-27 NOTE — Progress Notes (Signed)
Initial Nutrition Assessment  DOCUMENTATION CODES:   Not applicable  INTERVENTION:  - NPO per Surgery.   - Will monitor for diet advancement.   - If unable to advance diet in the next 3-4 days, consider TPN.   NUTRITION DIAGNOSIS:   Inadequate oral intake related to inability to eat as evidenced by NPO status.  GOAL:   Patient will meet greater than or equal to 90% of their needs  MONITOR:   Diet advancement, Labs, Weight trends  REASON FOR ASSESSMENT:   Malnutrition Screening Tool    ASSESSMENT:   62 y.o. female with a known history of anxiety, depression, bipolar, COPD presents to the emergency department for evaluation of diarrhea which has been ongoing for x1 month. Admitted for ischemic colitis or worsening sigmoid diverticulitis with microperforation and C diff colitis.    Patient endorses a UBW of 180# and weight loss over the past month due to not eating well.  Per EMR, weight history over the past year as below: 3/12: 176# 4/17: 177# 5/16: 177# 6/20: 177# 7/22: 191# 8/5: 180# 8/8: 183#   Current weight similar to reported UBW and consistent with most weights over the past year. Will need to continue to monitor weight.   Patient endorses typically eating 2 meals a day at home before her issues began 1 month ago. Often had yogurt and toast for breakfast and a meat, potato, and vegetable for dinner. Over the past month, she has eaten very little and only small amounts. Appetite has been decreased for several months.   Since admission she had been on a clear liquid diet and then a soft diet and during that time was documented to be consuming 25-90% of meals. She was also receiving Boost Breeze and drinking them although notes she likes chocolate Ensure better.   Patient made NPO yesterday by surgery. Their note today indicates plan to continue bowel rest with antibiotics. If patient were to worsen, would potentially require surgery.   Medications reviewed  and include: Zofran  Labs reviewed:  K+ 3.4   Diet Order:   Diet Order             Diet NPO time specified Except for: Ice Chips, Sips with Meds  Diet effective now                   EDUCATION NEEDS:  Education needs have been addressed  Skin:  Skin Assessment: Reviewed RN Assessment  Last BM:  8/12  Height:  Ht Readings from Last 1 Encounters:  08/23/22 5\' 5"  (1.651 m)   Weight:  Wt Readings from Last 1 Encounters:  08/23/22 83.3 kg   Ideal Body Weight:  56.82 kg  BMI:  Body mass index is 30.56 kg/m.  Estimated Nutritional Needs:  Kcal:  1700-1900 kcals Protein:  85-100 grams Fluid:  >/= 1.7L    Shelle Iron RD, LDN For contact information, refer to [facility].

## 2022-08-27 NOTE — Consult Note (Signed)
Referring Provider: Dr. Sunnie Nielsen Primary Care Physician:  [name], MD Primary Gastroenterologist:  Gentry Fitz (Dr. Jason Fila in [place])  Reason for Consultation:  Abdominal pain; C. Diff; Diverticulitis  HPI: Danielle Cox is a 62 y.o. female with history of recent C. Diff managed as an outpt with PO Vanco and recent development of complicated diverticulitis vs ischemic colitis. Colonoscopy in 2022 NEG for colitis. Three small colon polyps were removed (path not available to me at this time). Patient having mushy stools today. Reports abdominal pain has improved. Nurse in room.   Past Medical History:  Diagnosis Date   Adenomatous polyp of ascending colon 05/02/2020   Anxiety    Bipolar affective disorder (HCC) 05/17/2019   Bipolar disorder (HCC)    Bronchitis, chronic (HCC)    COPD (chronic obstructive pulmonary disease) (HCC)    Depression    Essential hypertension 02/17/2009   Fibromyalgia 11/21/2017   Gastropathy 08/26/2022   History of Clostridioides difficile colitis 08/26/2022   Insulin resistance 08/24/2018   Personality disorder (HCC)     Past Surgical History:  Procedure Laterality Date   CARPAL TUNNEL RELEASE Right    CESAREAN SECTION  1983, 1988, 49   TONSILLECTOMY  age 28    Prior to Admission medications   Medication Sig Start Date End Date Taking? Authorizing Provider  acetaminophen (TYLENOL) 500 MG tablet Take 2 tablets (1,000 mg total) by mouth every 6 (six) hours as needed for mild pain or headache. 05/18/19  Yes Rankin, Shuvon B, NP  atorvastatin (LIPITOR) 20 MG tablet TAKE 1 TABLET BY MOUTH DAILY 05/28/22  Yes [name], MD  benztropine (COGENTIN) 0.5 MG tablet Take 0.5 mg by mouth daily. 07/17/22  Yes [provider]  dicyclomine (BENTYL) 20 MG tablet Take 20 mg by mouth in the morning and at bedtime.   Yes [provider]  diphenoxylate-atropine (LOMOTIL) 2.5-0.025 MG tablet Take 1 tablet by mouth 4 (four) times daily as  needed for diarrhea or loose stools. 08/20/22  Yes [name], MD  divalproex (DEPAKOTE) 500 MG DR tablet Take 500 mg by mouth 2 (two) times daily. 07/14/22  Yes [provider]  modafinil (PROVIGIL) 200 MG tablet Take 1 tablet (200 mg total) by mouth in the morning and at bedtime. 09/22/20  Yes [name], MD  omeprazole (PRILOSEC) 20 MG capsule TAKE 1 CAPSULE BY MOUTH DAILY 07/12/22  Yes [name], MD  paliperidone (INVEGA) 6 MG 24 hr tablet Take 6 mg by mouth at bedtime.   Yes [provider]  promethazine (PHENERGAN) 12.5 MG tablet Take 1 tablet (12.5 mg total) by mouth every 8 (eight) hours as needed for nausea or vomiting. 08/20/22  Yes [name], MD  temazepam (RESTORIL) 15 MG capsule Take 30 mg by mouth at bedtime as needed for sleep.   Yes [provider]  traZODone (DESYREL) 150 MG tablet Take 300 mg by mouth at bedtime.   Yes [provider]  diclofenac (VOLTAREN) 75 MG EC tablet Take 1 tablet (75 mg total) by mouth 2 (two) times daily. Patient not taking: Reported on 08/20/2022 12/13/21   [name], MD  nystatin (MYCOSTATIN) 100000 UNIT/ML suspension Use as directed 5 mLs (500,000 Units total) in the mouth or throat 4 (four) times daily. Swish and spit. Patient not taking: Reported on 08/22/2022 08/06/22   Lula Olszewski, MD  QUEtiapine (SEROQUEL) 100 MG tablet Take 100 mg by mouth 2 (two) times daily. Patient not  taking: Reported on 08/22/2022 08/09/22   [provider]    Scheduled Meds:  acetaminophen  1,000 mg Oral Q6H   atorvastatin  20 mg Oral Daily   benztropine  0.5 mg Oral Daily   divalproex  500 mg Oral BID   enoxaparin (LOVENOX) injection  40 mg Subcutaneous Q24H   modafinil  200 mg Oral BID WC   nicotine  21 mg Transdermal Daily   nystatin  5 mL Oral QID   paliperidone  6 mg Oral Daily   pantoprazole  40 mg Oral Daily   sodium chloride flush  3 mL Intravenous Q12H   tamsulosin  0.4 mg Oral Daily    traZODone  300 mg Oral QHS   vancomycin  125 mg Oral QID   Continuous Infusions:  sodium chloride Stopped (08/26/22 1735)   ceFEPime (MAXIPIME) IV 2 g (08/27/22 1408)   lactated ringers     lactated ringers 100 mL/hr at 08/27/22 0838   methocarbamol (ROBAXIN) IV     metronidazole 500 mg (08/27/22 1248)   ondansetron (ZOFRAN) IV     PRN Meds:.sodium chloride, acetaminophen, albuterol, alum & mag hydroxide-simeth, hydrALAZINE, HYDROmorphone (DILAUDID) injection, ipratropium, lactated ringers, magic mouthwash, menthol-cetylpyridinium, methocarbamol (ROBAXIN) IV, methocarbamol, ondansetron (ZOFRAN) IV **OR** ondansetron (ZOFRAN) IV, mouth rinse, phenol, prochlorperazine, sodium chloride, temazepam, traMADol  Allergies as of 08/22/2022 - Review Complete 08/22/2022  Allergen Reaction Noted   Moxifloxacin hcl Palpitations 11/06/2012   Penicillins Rash 09/29/2012   Corticosteroids Other (See Comments) 01/01/2018    Family History  Problem Relation Age of Onset   Cancer Mother    Depression Mother    Alcohol abuse Mother    Depression Father    Alcohol abuse Father    Cancer Sister    Alcohol abuse Sister    Depression Sister    Alcohol abuse Brother    Depression Brother        overdose   Breast cancer Neg Hx     Social History   Socioeconomic History   Marital status: Married    Spouse name: Onalee Hua   Number of children: 3   Years of education: Not on file   Highest education level: GED or equivalent  Occupational History   Occupation: disabled  Tobacco Use   Smoking status: Every Day    Current packs/day: 1.50    Average packs/day: 1.5 packs/day for 40.0 years (60.0 ttl pk-yrs)    Types: Cigarettes   Smokeless tobacco: Never  Vaping Use   Vaping status: Never Used  Substance and Sexual Activity   Alcohol use: No   Drug use: No   Sexual activity: Not Currently    Partners: Male    Birth control/protection: None  Other Topics Concern   Not on file  Social History  Narrative   10/29/2012 [name] Shannae was born and grew up in [place], [place]. She has 2 brothers and one sister. She reports that her childhood was "poor," and explains that she needs both financially and emotionally. She completed the 10th grade, then achieved her GED. She has been married twice. The first time at age 41, and that ended after 1 year. She is currently married to her second husband of 27 years. She has 2 sons and one daughter. She is currently unemployed and on disability for 2 years. She lives with her husband, her daughter, and one of her daughter's friends. She denies any legal difficulties. She reports that she is spiritual but not religious. Her hobbies include reading  and cross stitch. She reports that she has no social support network. 10/29/2012 [name]      Patient is right-handed.she lives with her husband ina one story home with a few steps to enter. She drinks decaff tea.  She is active around the home.   Drinks gatorade and propel  daily    Social Determinants of Health   Financial Resource Strain: Low Risk  (01/29/2022)   Overall Financial Resource Strain (CARDIA)    Difficulty of Paying Living Expenses: Not hard at all  Food Insecurity: No Food Insecurity (08/23/2022)   Hunger Vital Sign    Worried About Running Out of Food in the Last Year: Never true    Ran Out of Food in the Last Year: Never true  Transportation Needs: No Transportation Needs (08/23/2022)   PRAPARE - Administrator, Civil Service (Medical): No    Lack of Transportation (Non-Medical): No  Physical Activity: Insufficiently Active (01/29/2022)   Exercise Vital Sign    Days of Exercise per Week: 5 days    Minutes of Exercise per Session: 20 min  Stress: No Stress Concern Present (01/29/2022)   [employer] of Occupational Health - Occupational Stress Questionnaire    Feeling of Stress : Only a little  Social Connections: Moderately Isolated (01/29/2022)   Social Connection and Isolation Panel  [NHANES]    Frequency of Communication with Friends and Family: More than three times a week    Frequency of Social Gatherings with Friends and Family: More than three times a week    Attends Religious Services: Never    Database administrator or Organizations: No    Attends Banker Meetings: Never    Marital Status: Married  Catering manager Violence: Not At Risk (08/23/2022)   Humiliation, Afraid, Rape, and Kick questionnaire    Fear of Current or Ex-Partner: No    Emotionally Abused: No    Physically Abused: No    Sexually Abused: No    Review of Systems: All negative except as stated above in HPI.  Physical Exam: Vital signs: Vitals:   08/27/22 0555 08/27/22 1303  BP:  126/63  Pulse:  68  Resp:  18  Temp: (!) 101.4 F (38.6 C) 99.7 F (37.6 C)  SpO2:  90%   Last BM Date : 08/27/22 General:  Lethargic, Well-developed, well-nourished, pleasant and cooperative in NAD Head: normocephalic, atraumatic Eyes: anicteric sclera ENT: oropharynx clear Neck: supple, nontender Lungs:  Clear throughout to auscultation.   No wheezes, crackles, or rhonchi. No acute distress. Heart:  Regular rate and rhythm; no murmurs, clicks, rubs,  or gallops. Abdomen: diffuse tenderness with guarding, soft, nondistended, +BS  Rectal:  Deferred Ext: no edema  GI:  Lab Results: Recent Labs    08/25/22 0926 08/26/22 0812 08/27/22 0418  WBC 8.2 9.1 9.4  HGB 11.9* 12.7 12.7  HCT 36.7 38.9 39.6  PLT 144* 167 178   BMET Recent Labs    08/26/22 0812 08/26/22 2356 08/27/22 0830  NA 141 140 141  K 4.1 3.2* 3.4*  CL 104 105 103  CO2 26 26 28   GLUCOSE 105* 111* 109*  BUN 5* 7 7  CREATININE 0.55 0.51 0.52  CALCIUM 8.5* 8.0* 8.1*   LFT Recent Labs    08/26/22 2356  PROT 5.4*  ALBUMIN 2.5*  AST 19  ALT 24  ALKPHOS 44  BILITOT 0.4   PT/INR No results for input(s): "LABPROT", "INR" in the last 72 hours.  Studies/Results: CT ABDOMEN PELVIS W CONTRAST  Result Date:  08/26/2022 CLINICAL DATA:  Inpatient. Abdominal pain, acute (Ped 0-17y). Recent diagnosis of sigmoid diverticulitis. EXAM: CT ABDOMEN AND PELVIS WITH CONTRAST TECHNIQUE: Multidetector CT imaging of the abdomen and pelvis was performed using the standard protocol following bolus administration of intravenous contrast. RADIATION DOSE REDUCTION: This exam was performed according to the departmental dose-optimization program which includes automated exposure control, adjustment of the mA and/or kV according to patient size and/or use of iterative reconstruction technique. CONTRAST:  OMNIPAQUE IOHEXOL 300 MG/ML  SOLN COMPARISON:  08/25/2022 abdominal radiograph. 08/22/2022 CT abdomen/pelvis. FINDINGS: Lower chest: Trace posterior bilateral pleural effusions, right greater than left, increased bilaterally. Similar trace pericardial effusion. Mild posterior bibasilar atelectasis. Healed lateral right eighth rib fracture. Hepatobiliary: Normal liver size. Simple 3.3 cm inferior left liver cyst with numerous additional subcentimeter hypodense liver lesions scattered throughout the liver that are too small to characterize and unchanged. No appreciable new liver lesions. Normal gallbladder with no radiopaque cholelithiasis. No biliary ductal dilatation. Pancreas: Normal, with no mass or duct dilation. Spleen: Normal size. No mass. Adrenals/Urinary Tract: Normal adrenals. No hydronephrosis. Simple 2.1 cm posterior lower left renal cyst with additional scattered subcentimeter hypodense left renal cortical lesions that are too small to characterize and are unchanged, for which no follow-up imaging is recommended. Normal bladder. Stomach/Bowel: Normal non-distended stomach. Normal caliber small bowel with no small bowel wall thickening. Oral contrast transits to the left colon. Right upper quadrant cecum with normal appendix. Marked sigmoid diverticulosis with wall thickening throughout the mid sigmoid colon with prominent  pericolonic fat stranding and new pericolonic free air throughout the sigmoid mesentery, which appears to track along the sigmoid mesenteric veins into the inferior mesenteric vein on the coronal images (series 5/image 62). Inflammatory fat stranding extends throughout the anterior left paranephric retroperitoneum and inferiorly into the presacral space. No measurable pericolonic fluid collections. Vascular/Lymphatic: Atherosclerotic nonaneurysmal abdominal aorta. Patent portal, splenic, hepatic and renal veins. Mildly enlarged 1.4 cm porta hepatis node (series 3/image 34), stable. Newly mildly enlarged 1.7 cm portacaval node (series 3/image 37). Reproductive: Grossly normal uterus.  No adnexal mass. Other: No focal fluid collection. Musculoskeletal: No aggressive appearing focal osseous lesions. Moderate degenerative disc disease at L5-S1. IMPRESSION: 1. Unusual perforated acute sigmoid diverticulitis with new pericolonic free air tracking along the sigmoid mesenteric veins into the inferior mesenteric vein with worsened inflammatory fat stranding extending throughout the anterior left retroperitoneum and inferiorly into the presacral space. No measurable pericolonic fluid collections. Findings raise concern for development of ischemic colitis. 2. Trace posterior bilateral pleural effusions, right greater than left, increased bilaterally. Similar trace pericardial effusion. 3. Nonspecific mild porta hepatis and portacaval lymphadenopathy, mildly increased. 4.  Aortic Atherosclerosis (ICD10-I70.0). Critical Value/emergent results were called by telephone at the time of interpretation on 08/26/2022 at 4:40 pm to provider [facility] , who verbally acknowledged these results. Electronically Signed   By: Delbert Phenix M.D.   On: 08/26/2022 16:45   DG Abd 1 View  Result Date: 08/25/2022 CLINICAL DATA:  Lower abdominal pain EXAM: ABDOMEN - 1 VIEW COMPARISON:  None Available. FINDINGS: No dilated large or small  bowel. Small amount gas and stool in the rectum. No pathologic calcifications. No organomegaly. No acute osseous abnormality. IMPRESSION: 1. No acute findings. 2. Small amount of gas and stool in the rectum. Electronically Signed   By: Genevive Bi M.D.   On: 08/25/2022 16:58    Impression/Plan: Colitis likely due to ischemic colitis  in setting of recent C. Diff colitis and diverticulitis. Watery stools are resolving. Continue broad spectrum Abx and bowel rest. Defer to surgery for timing of advancement of diet. No role for sigmoidoscopy/colonoscopy. Continue supportive care. Will f/u.    LOS: 5 days   Shirley Friar  08/27/2022, 2:46 PM  Questions please call [phone]

## 2022-08-27 NOTE — Progress Notes (Signed)
Patient ID: Danielle Cox, female   DOB: 12/14/1960, 62 y.o.   MRN: [id number] [facility] Surgery Progress Note     Subjective: CC-  Having more abdominal pain. Pain is diffuse. Denies n/v. Ongoing diarrhea.  WBC 9.4, ongoing fevers with TMAX 101.4  Objective: Vital signs in last 24 hours: Temp:  [98.9 F (37.2 C)-101.4 F (38.6 C)] 101.4 F (38.6 C) (08/12 0555) Pulse Rate:  [77-90] 90 (08/12 0519) Resp:  [14-20] 20 (08/12 0519) BP: (112-127)/(39-68) 123/68 (08/12 0519) SpO2:  [93 %-98 %] 98 % (08/12 0519) Last BM Date : 08/27/22  Intake/Output from previous day: 08/11 0701 - 08/12 0700 In: 1662.5 [P.O.:480; I.V.:782.5; IV Piggyback:400] Out: -  Intake/Output this shift: No intake/output data recorded.  PE: Gen:  Alert, NAD, pleasant Card:  RRR Pulm:  rate and effort normal on room air Abd: Soft, nondistended, mild diffuse tenderness without rebound or guarding  Lab Results:  Recent Labs    08/26/22 0812 08/27/22 0418  WBC 9.1 9.4  HGB 12.7 12.7  HCT 38.9 39.6  PLT 167 178   BMET Recent Labs    08/26/22 2356 08/27/22 0830  NA 140 141  K 3.2* 3.4*  CL 105 103  CO2 26 28  GLUCOSE 111* 109*  BUN 7 7  CREATININE 0.51 0.52  CALCIUM 8.0* 8.1*   PT/INR No results for input(s): "LABPROT", "INR" in the last 72 hours. CMP     Component Value Date/Time   NA 141 08/27/2022 0830   K 3.4 (L) 08/27/2022 0830   CL 103 08/27/2022 0830   CO2 28 08/27/2022 0830   GLUCOSE 109 (H) 08/27/2022 0830   BUN 7 08/27/2022 0830   CREATININE 0.52 08/27/2022 0830   CALCIUM 8.1 (L) 08/27/2022 0830   PROT 5.4 (L) 08/26/2022 2356   ALBUMIN 2.5 (L) 08/26/2022 2356   AST 19 08/26/2022 2356   ALT 24 08/26/2022 2356   ALKPHOS 44 08/26/2022 2356   BILITOT 0.4 08/26/2022 2356   GFRNONAA >60 08/27/2022 0830   GFRAA >60 05/15/2019 1246   Lipase     Component Value Date/Time   LIPASE 26 08/22/2022 1723       Studies/Results: CT ABDOMEN PELVIS W  CONTRAST  Result Date: 08/26/2022 CLINICAL DATA:  Inpatient. Abdominal pain, acute (Ped 0-17y). Recent diagnosis of sigmoid diverticulitis. EXAM: CT ABDOMEN AND PELVIS WITH CONTRAST TECHNIQUE: Multidetector CT imaging of the abdomen and pelvis was performed using the standard protocol following bolus administration of intravenous contrast. RADIATION DOSE REDUCTION: This exam was performed according to the departmental dose-optimization program which includes automated exposure control, adjustment of the mA and/or kV according to patient size and/or use of iterative reconstruction technique. CONTRAST:  OMNIPAQUE IOHEXOL 300 MG/ML  SOLN COMPARISON:  08/25/2022 abdominal radiograph. 08/22/2022 CT abdomen/pelvis. FINDINGS: Lower chest: Trace posterior bilateral pleural effusions, right greater than left, increased bilaterally. Similar trace pericardial effusion. Mild posterior bibasilar atelectasis. Healed lateral right eighth rib fracture. Hepatobiliary: Normal liver size. Simple 3.3 cm inferior left liver cyst with numerous additional subcentimeter hypodense liver lesions scattered throughout the liver that are too small to characterize and unchanged. No appreciable new liver lesions. Normal gallbladder with no radiopaque cholelithiasis. No biliary ductal dilatation. Pancreas: Normal, with no mass or duct dilation. Spleen: Normal size. No mass. Adrenals/Urinary Tract: Normal adrenals. No hydronephrosis. Simple 2.1 cm posterior lower left renal cyst with additional scattered subcentimeter hypodense left renal cortical lesions that are too small to characterize and are unchanged, for which no  follow-up imaging is recommended. Normal bladder. Stomach/Bowel: Normal non-distended stomach. Normal caliber small bowel with no small bowel wall thickening. Oral contrast transits to the left colon. Right upper quadrant cecum with normal appendix. Marked sigmoid diverticulosis with wall thickening throughout the mid  sigmoid colon with prominent pericolonic fat stranding and new pericolonic free air throughout the sigmoid mesentery, which appears to track along the sigmoid mesenteric veins into the inferior mesenteric vein on the coronal images (series 5/image 62). Inflammatory fat stranding extends throughout the anterior left paranephric retroperitoneum and inferiorly into the presacral space. No measurable pericolonic fluid collections. Vascular/Lymphatic: Atherosclerotic nonaneurysmal abdominal aorta. Patent portal, splenic, hepatic and renal veins. Mildly enlarged 1.4 cm porta hepatis node (series 3/image 34), stable. Newly mildly enlarged 1.7 cm portacaval node (series 3/image 37). Reproductive: Grossly normal uterus.  No adnexal mass. Other: No focal fluid collection. Musculoskeletal: No aggressive appearing focal osseous lesions. Moderate degenerative disc disease at L5-S1. IMPRESSION: 1. Unusual perforated acute sigmoid diverticulitis with new pericolonic free air tracking along the sigmoid mesenteric veins into the inferior mesenteric vein with worsened inflammatory fat stranding extending throughout the anterior left retroperitoneum and inferiorly into the presacral space. No measurable pericolonic fluid collections. Findings raise concern for development of ischemic colitis. 2. Trace posterior bilateral pleural effusions, right greater than left, increased bilaterally. Similar trace pericardial effusion. 3. Nonspecific mild porta hepatis and portacaval lymphadenopathy, mildly increased. 4.  Aortic Atherosclerosis (ICD10-I70.0). Critical Value/emergent results were called by telephone at the time of interpretation on 08/26/2022 at 4:40 pm to provider [facility] , who verbally acknowledged these results. Electronically Signed   By: Delbert Phenix M.D.   On: 08/26/2022 16:45   DG Abd 1 View  Result Date: 08/25/2022 CLINICAL DATA:  Lower abdominal pain EXAM: ABDOMEN - 1 VIEW COMPARISON:  None Available. FINDINGS:  No dilated large or small bowel. Small amount gas and stool in the rectum. No pathologic calcifications. No organomegaly. No acute osseous abnormality. IMPRESSION: 1. No acute findings. 2. Small amount of gas and stool in the rectum. Electronically Signed   By: Genevive Bi M.D.   On: 08/25/2022 16:58    Anti-infectives: Anti-infectives (From admission, onward)    Start     Dose/Rate Route Frequency Ordered Stop   08/27/22 1400  ceFEPIme (MAXIPIME) 2 g in sodium chloride 0.9 % 100 mL IVPB        2 g 200 mL/hr over 30 Minutes Intravenous Every 8 hours 08/27/22 0649     08/26/22 2200  metroNIDAZOLE (FLAGYL) IVPB 500 mg        500 mg 100 mL/hr over 60 Minutes Intravenous 2 times daily 08/26/22 1652 09/05/22 2159   08/26/22 1745  ceFEPIme (MAXIPIME) 2 g in sodium chloride 0.9 % 100 mL IVPB  Status:  Discontinued       Note to Pharmacy: Pharmacy may adjust dosing strength, schedule, rate of infusion, etc as needed to optimize therapy   2 g 200 mL/hr over 30 Minutes Intravenous Every 8 hours 08/26/22 1649 08/27/22 0647   08/24/22 1400  vancomycin (VANCOCIN) capsule 125 mg        125 mg Oral 4 times daily 08/24/22 1235 09/03/22 1359   08/23/22 2000  cefTRIAXone (ROCEPHIN) 2 g in sodium chloride 0.9 % 100 mL IVPB  Status:  Discontinued        2 g 200 mL/hr over 30 Minutes Intravenous Every 24 hours 08/23/22 0542 08/26/22 1649   08/23/22 1436  cefTRIAXone (ROCEPHIN) 2 g in sodium  chloride 0.9 % 100 mL IVPB  Status:  Discontinued        2 g 200 mL/hr over 30 Minutes Intravenous Every 24 hours 08/23/22 1436 08/23/22 1442   08/23/22 1436  metroNIDAZOLE (FLAGYL) IVPB 500 mg  Status:  Discontinued        500 mg 100 mL/hr over 60 Minutes Intravenous Every 12 hours 08/23/22 1436 08/23/22 1441   08/23/22 0800  metroNIDAZOLE (FLAGYL) IVPB 500 mg  Status:  Discontinued        500 mg 100 mL/hr over 60 Minutes Intravenous Every 12 hours 08/23/22 0542 08/26/22 1652   08/22/22 1915  cefTRIAXone (ROCEPHIN)  2 g in sodium chloride 0.9 % 100 mL IVPB        2 g 200 mL/hr over 30 Minutes Intravenous  Once 08/22/22 1900 08/22/22 2032   08/22/22 1915  metroNIDAZOLE (FLAGYL) IVPB 500 mg        500 mg 100 mL/hr over 60 Minutes Intravenous  Once 08/22/22 1900 08/22/22 2123        Assessment/Plan Ischemic colitis or worsening sigmoid diverticulitis with microperforation C diff colitis - repeat CT 8/11 with perforated acute sigmoid diverticulitis with new pericolonic free air tracking along the sigmoid mesenteric veins into the inferior mesenteric vein with worsened inflammatory fat stranding extending throughout the anterior left retroperitoneum and inferiorly into the presacral space; no measurable pericolonic fluid collections; findings raise concern for development of ischemic colitis - Antibiotics switched to cefepime and flagyl 8/11. She is on oral vancomycin for C diff. GI panel also positive for E coli - Continues to have intermittent fevers, but WBC WNL and no peritonitis on exam. Continue bowel rest and antibiotics. We will follow closely. We discussed that if she were to worsen she may require surgery for partial colectomy/colostomy.   ID - cefepime and flagyl 8/11>>. Oral vancomycin 8/9>> FEN - IVF, NPO VTE - lovenox Foley - none  COPD GERD HLD Anxiety, depression, Bipolar  I reviewed hospitalist notes, last 24 h vitals and pain scores, last 48 h intake and output, last 24 h labs and trends, and last 24 h imaging results.    LOS: 5 days    Franne Forts, [facility] Surgery 08/27/2022, 11:30 AM Please see Amion for pager number during day hours 7:00am-4:30pm

## 2022-08-27 NOTE — Telephone Encounter (Signed)
Called and LVM informing pt that ED f/u is needed.

## 2022-08-27 NOTE — Progress Notes (Signed)
PROGRESS NOTE    Danielle Cox  ZDG:[id number] DOB: [birth year] DOA: 08/22/2022 PCP: [name], MD   Brief Narrative: 62 year old with past medical history significant for anxiety, depression, bipolar, COPD presents for evaluation of diarrhea she has developed diarrhea since a month ago more than 10 episodes per day refractory to Flagyl, Imodium, Lomotil and Bentyl.  Initially stool culture were negative.  She developed fever at day of admission at 102, report 15 pounds weight loss.  Patient admitted with sepsis secondary to sigmoid diverticulitis, she has been having diarrhea for a month, she was evaluated as an outpatient at [facility] clinic and was found to have C. difficile toxin positive.  On admission CT abdomen and pelvis consistent with sigmoid diverticulitis she was also started on IV ceftriaxone and Flagyl, oral vancomycin.  Repeated CT abdomen and pelvis 80/11 due to persistent low-grade fever and abdominal pain consistent with ischemic colitis versus perforated diverticulitis.  Surgery consulted recommended change antibiotics to cefepime and Flagyl.  GI has also been consulted.    Assessment & Plan:   Principal Problem:   Diverticulitis of colon with perforation Active Problems:   Bipolar affective disorder (HCC)   Nicotine dependence with current use   Essential hypertension   Personality disorder (HCC)   Fibromyalgia   COPD (chronic obstructive pulmonary disease) (HCC)   Seasonal allergic rhinitis due to pollen   Insulin resistance   Dyslipidemia   Tardive dyskinesia   SIRS (systemic inflammatory response syndrome) (HCC)   Adenomatous polyp of ascending colon   Tobacco use disorder   Insomnia, unspecified   Current every day smoker   Chronic diarrhea   History of Clostridioides difficile colitis   Gastropathy   Bronchitis, chronic (HCC)  1-Sepsis secondary to Sigmoid Diverticulitis:  -Patient presented with diarrhea, fever 101, tachycardia heart rate 110,  tachypnea, white blood cell 13.  CT abdomen and pelvis: Sigmoid diverticulitis.  No abscess or obstruction. Numerous cysts in the liver as well as left kidney. Non-obstructing left renal stones. -Outpatient  workup: Ova and parasites negative, stool culture negative -GI pathogen ordered. C diff not accepted by lab stool was not watery.  -GI pathogen positive for E coli/ enterotoxigenic.  -Treated with IV ceftriaxone and Flagyl for diverticulitis until 8/11--antibiotics change to Cefepime and flagyl 8/11..  -Started  Oral vancomycin 8/09. Patient was seen at [facility] and Had C diff Toxine Positive.Results has not been fax. Discussed with Laural Golden.  -8/11: Develops worsening pressure abdominal pain, still with low grade fever. Repeated CT scan showed perforated diverticulitis. Surgery consul;ted. IV antibiotics change to Cefepime plus Flagyl.  GI has been consulted.   C diff Colitis. Started  Orval Vancomycin 8/09  History of anxiety, depression, bipolar Continue with Cogentin, Depakote, Provigil, Invega, temazepam, trazodone  Hypokalemia; replete IV>   Hyperlipidemia: -On statins.   GERD: -on PPI.   History of COPD: -PRN nebulizer.   Difficulty Voiding.  Had episode of difficulty voiding yesterday.  Started on Flomax.  No issues today   Estimated body mass index is 30.56 kg/m as calculated from the following:   Height as of this encounter: 5\' 5"  (1.651 m).   Weight as of this encounter: 83.3 kg.   DVT prophylaxis: SCD Code Status: Full code Family Communication: Care discussed with patient. Husband over phone 8/12 Disposition Plan:  Status is: Inpatient Remains inpatient appropriate because: management of sigmoid diverticulitis.     Consultants:  None  Procedures:  None  Antimicrobials:    Subjective: She report  worsening abdominal distension, pain, surgery informed.  Spike fever this am.   Objective: Vitals:   08/26/22 1744 08/26/22 2110 08/27/22 0519  08/27/22 0555  BP: (!) 126/56 (!) 119/58 123/68   Pulse: 88 80 90   Resp: 16 17 20    Temp:  99.9 F (37.7 C) 100.1 F (37.8 C) (!) 101.4 F (38.6 C)  TempSrc:  Oral Oral Oral  SpO2: 94% 95% 98%   Weight:      Height:        Intake/Output Summary (Last 24 hours) at 08/27/2022 0835 Last data filed at 08/27/2022 0400 Gross per 24 hour  Intake 1662.51 ml  Output --  Net 1662.51 ml   Filed Weights   08/23/22 1737  Weight: 83.3 kg    Examination:  General exam: NAD Respiratory system; CTA Cardiovascular system: S 1, S 2 RRR Gastrointestinal system: BS present, soft, distended. Tender.  Central nervous system: alert Extremities: no edema    Data Reviewed: I have personally reviewed following labs and imaging studies  CBC: Recent Labs  Lab 08/22/22 1723 08/23/22 1451 08/25/22 0926 08/26/22 0812 08/27/22 0418  WBC 13.4* 10.8* 8.2 9.1 9.4  HGB 13.9 12.7 11.9* 12.7 12.7  HCT 41.5 39.3 36.7 38.9 39.6  MCV 91.8 94.0 92.7 92.2 93.6  PLT 182 148* 144* 167 178   Basic Metabolic Panel: Recent Labs  Lab 08/23/22 1451 08/24/22 0856 08/25/22 0926 08/26/22 0812 08/26/22 2356 08/27/22 0418  NA 138 139 141 141 140  --   K 3.1* 3.5 3.5 4.1 3.2*  --   CL 107 105 104 104 105  --   CO2 24 24 27 26 26   --   GLUCOSE 148* 122* 107* 105* 111*  --   BUN 11 7 5* 5* 7  --   CREATININE 0.61 0.54 0.50 0.55 0.51  --   CALCIUM 8.3* 8.5* 8.3* 8.5* 8.0*  --   MG 2.0  --   --   --   --  2.2   GFR: Estimated Creatinine Clearance: 79.7 mL/min (by C-G formula based on SCr of 0.51 mg/dL). Liver Function Tests: Recent Labs  Lab 08/22/22 1723 08/24/22 0856 08/26/22 2356  AST 14* 28 19  ALT 16 31 24   ALKPHOS 46 46 44  BILITOT 0.7 0.4 0.4  PROT 6.3* 6.1* 5.4*  ALBUMIN 3.3* 2.9* 2.5*   Recent Labs  Lab 08/22/22 1723  LIPASE 26   No results for input(s): "AMMONIA" in the last 168 hours. Coagulation Profile: Recent Labs  Lab 08/23/22 1451  INR 1.2   Cardiac Enzymes: No  results for input(s): "CKTOTAL", "CKMB", "CKMBINDEX", "TROPONINI" in the last 168 hours. BNP (last 3 results) No results for input(s): "PROBNP" in the last 8760 hours. HbA1C: No results for input(s): "HGBA1C" in the last 72 hours. CBG: No results for input(s): "GLUCAP" in the last 168 hours. Lipid Profile: No results for input(s): "CHOL", "HDL", "LDLCALC", "TRIG", "CHOLHDL", "LDLDIRECT" in the last 72 hours. Thyroid Function Tests: No results for input(s): "TSH", "T4TOTAL", "FREET4", "T3FREE", "THYROIDAB" in the last 72 hours. Anemia Panel: No results for input(s): "VITAMINB12", "FOLATE", "FERRITIN", "TIBC", "IRON", "RETICCTPCT" in the last 72 hours. Sepsis Labs: Recent Labs  Lab 08/22/22 1829 08/23/22 1438  LATICACIDVEN 1.3 1.7    Recent Results (from the past 240 hour(s))  Blood culture (routine x 2)     Status: None (Preliminary result)   Collection Time: 08/22/22  6:29 PM   Specimen: BLOOD  Result Value Ref Range Status  Specimen Description   Final    BLOOD RIGHT ANTECUBITAL Performed at [facility], [address] [place], [place] [address]    Special Requests   Final    BOTTLES DRAWN AEROBIC AND ANAEROBIC Blood Culture adequate volume Performed at [facility], [address] [place], [place] [address]    Culture  Setup Time   Final    GRAM POSITIVE RODS IN BOTH AEROBIC AND ANAEROBIC BOTTLES CRITICAL RESULT CALLED TO, READ BACK BY AND VERIFIED WITH: PHARMD AMY GREGORY [id number] AT 1948 BY EC    Culture   Final    GRAM POSITIVE RODS CULTURE REINCUBATED FOR BETTER GROWTH Performed at [facility], [address] [place], [place] [address]    Report Status PENDING  Incomplete  Blood culture (routine x 2)     Status: None (Preliminary result)   Collection Time: 08/22/22  6:30 PM   Specimen: BLOOD  Result Value Ref Range Status   Specimen Description   Final    BLOOD LEFT ANTECUBITAL Performed at [facility], [address] [place], [place] [address]    Special Requests   Final    BOTTLES DRAWN AEROBIC AND ANAEROBIC Blood Culture results may not be optimal due to an excessive volume of blood received in culture bottles Performed at [facility], [address], [place], [place] [address]    Culture  Setup Time   Final    GRAM POSITIVE RODS ANAEROBIC BOTTLE ONLY CRITICAL VALUE NOTED.  VALUE IS CONSISTENT WITH PREVIOUSLY REPORTED AND CALLED VALUE.    Culture   Final    GRAM POSITIVE RODS CULTURE REINCUBATED FOR BETTER GROWTH Performed at [facility], [address], [place], [place] [address]    Report Status PENDING  Incomplete  SARS Coronavirus 2 by RT PCR (hospital order, performed in [facility] hospital lab) *cepheid single result test* Anterior Nasal Swab     Status: None   Collection Time: 08/22/22  7:21 PM   Specimen: Anterior Nasal Swab  Result Value Ref Range Status   SARS Coronavirus 2 by RT PCR NEGATIVE NEGATIVE Final    Comment: (NOTE) SARS-CoV-2 target nucleic acids are NOT DETECTED.  The SARS-CoV-2 RNA is generally detectable in upper and lower respiratory specimens during the acute phase of infection. The lowest concentration of SARS-CoV-2 viral copies this assay can detect is 250 copies / mL. A negative result does not preclude SARS-CoV-2 infection and should not be used as the sole basis for treatment or other patient management decisions.  A negative result may occur with improper specimen collection / handling, submission of specimen other than nasopharyngeal swab, presence of viral mutation(s) within the areas targeted by this assay, and inadequate number of viral copies (<250 copies / mL). A negative result must be combined with clinical observations, patient history, and epidemiological information.  Fact Sheet for Patients:   RoadLapTop.co.za  Fact Sheet for Healthcare  Providers: http://kim-miller.com/  This test is not yet approved or  cleared by the [place] FDA and has been authorized for detection and/or diagnosis of SARS-CoV-2 by FDA under an Emergency Use Authorization (EUA).  This EUA will remain in effect (meaning this test can be used) for the duration of the COVID-19 declaration under Section 564(b)(1) of the Act, 21 U.S.C. section 360bbb-3(b)(1), unless the authorization is terminated or revoked sooner.  Performed at [facility], [address], [place], [place] [address]  Gastrointestinal Panel by PCR , Stool     Status: Abnormal   Collection Time: 08/23/22 12:24 PM   Specimen: Stool  Result Value Ref Range Status   Campylobacter species NOT DETECTED NOT DETECTED Final   Plesimonas shigelloides NOT DETECTED NOT DETECTED Final   Salmonella species NOT DETECTED NOT DETECTED Final   Yersinia enterocolitica NOT DETECTED NOT DETECTED Final   Vibrio species NOT DETECTED NOT DETECTED Final   Vibrio cholerae NOT DETECTED NOT DETECTED Final   Enteroaggregative E coli (EAEC) NOT DETECTED NOT DETECTED Final   Enteropathogenic E coli (EPEC) DETECTED (A) NOT DETECTED Final    Comment: RESULT CALLED TO, READ BACK BY AND VERIFIED WITH: JAKE RIMANDO @0621  08/24/22 MJU    Enterotoxigenic E coli (ETEC) DETECTED (A) NOT DETECTED Final    Comment: RESULT CALLED TO, READ BACK BY AND VERIFIED WITH: JAKE RIMANDO @0621  08/24/22 MJU    Shiga like toxin producing E coli (STEC) NOT DETECTED NOT DETECTED Final   Shigella/Enteroinvasive E coli (EIEC) NOT DETECTED NOT DETECTED Final   Cryptosporidium NOT DETECTED NOT DETECTED Final   Cyclospora cayetanensis NOT DETECTED NOT DETECTED Final   Entamoeba histolytica NOT DETECTED NOT DETECTED Final   Giardia lamblia NOT DETECTED NOT DETECTED Final   Adenovirus F40/41 NOT DETECTED NOT DETECTED Final   Astrovirus NOT DETECTED NOT DETECTED Final   Norovirus GI/GII NOT  DETECTED NOT DETECTED Final   Rotavirus A NOT DETECTED NOT DETECTED Final   Sapovirus (I, II, IV, and V) NOT DETECTED NOT DETECTED Final    Comment: Performed at [facility], [address] [place], [place] [address]         Radiology Studies: CT ABDOMEN PELVIS W CONTRAST  Result Date: 08/26/2022 CLINICAL DATA:  Inpatient. Abdominal pain, acute (Ped 0-17y). Recent diagnosis of sigmoid diverticulitis. EXAM: CT ABDOMEN AND PELVIS WITH CONTRAST TECHNIQUE: Multidetector CT imaging of the abdomen and pelvis was performed using the standard protocol following bolus administration of intravenous contrast. RADIATION DOSE REDUCTION: This exam was performed according to the departmental dose-optimization program which includes automated exposure control, adjustment of the mA and/or kV according to patient size and/or use of iterative reconstruction technique. CONTRAST:  OMNIPAQUE IOHEXOL 300 MG/ML  SOLN COMPARISON:  08/25/2022 abdominal radiograph. 08/22/2022 CT abdomen/pelvis. FINDINGS: Lower chest: Trace posterior bilateral pleural effusions, right greater than left, increased bilaterally. Similar trace pericardial effusion. Mild posterior bibasilar atelectasis. Healed lateral right eighth rib fracture. Hepatobiliary: Normal liver size. Simple 3.3 cm inferior left liver cyst with numerous additional subcentimeter hypodense liver lesions scattered throughout the liver that are too small to characterize and unchanged. No appreciable new liver lesions. Normal gallbladder with no radiopaque cholelithiasis. No biliary ductal dilatation. Pancreas: Normal, with no mass or duct dilation. Spleen: Normal size. No mass. Adrenals/Urinary Tract: Normal adrenals. No hydronephrosis. Simple 2.1 cm posterior lower left renal cyst with additional scattered subcentimeter hypodense left renal cortical lesions that are too small to characterize and are unchanged, for which no follow-up imaging is recommended.  Normal bladder. Stomach/Bowel: Normal non-distended stomach. Normal caliber small bowel with no small bowel wall thickening. Oral contrast transits to the left colon. Right upper quadrant cecum with normal appendix. Marked sigmoid diverticulosis with wall thickening throughout the mid sigmoid colon with prominent pericolonic fat stranding and new pericolonic free air throughout the sigmoid mesentery, which appears to track along the sigmoid mesenteric veins into the inferior mesenteric vein on the coronal images (series 5/image 62). Inflammatory fat stranding extends throughout the anterior  left paranephric retroperitoneum and inferiorly into the presacral space. No measurable pericolonic fluid collections. Vascular/Lymphatic: Atherosclerotic nonaneurysmal abdominal aorta. Patent portal, splenic, hepatic and renal veins. Mildly enlarged 1.4 cm porta hepatis node (series 3/image 34), stable. Newly mildly enlarged 1.7 cm portacaval node (series 3/image 37). Reproductive: Grossly normal uterus.  No adnexal mass. Other: No focal fluid collection. Musculoskeletal: No aggressive appearing focal osseous lesions. Moderate degenerative disc disease at L5-S1. IMPRESSION: 1. Unusual perforated acute sigmoid diverticulitis with new pericolonic free air tracking along the sigmoid mesenteric veins into the inferior mesenteric vein with worsened inflammatory fat stranding extending throughout the anterior left retroperitoneum and inferiorly into the presacral space. No measurable pericolonic fluid collections. Findings raise concern for development of ischemic colitis. 2. Trace posterior bilateral pleural effusions, right greater than left, increased bilaterally. Similar trace pericardial effusion. 3. Nonspecific mild porta hepatis and portacaval lymphadenopathy, mildly increased. 4.  Aortic Atherosclerosis (ICD10-I70.0). Critical Value/emergent results were called by telephone at the time of interpretation on 08/26/2022 at 4:40 pm  to provider [facility] , who verbally acknowledged these results. Electronically Signed   By: Delbert Phenix M.D.   On: 08/26/2022 16:45   DG Abd 1 View  Result Date: 08/25/2022 CLINICAL DATA:  Lower abdominal pain EXAM: ABDOMEN - 1 VIEW COMPARISON:  None Available. FINDINGS: No dilated large or small bowel. Small amount gas and stool in the rectum. No pathologic calcifications. No organomegaly. No acute osseous abnormality. IMPRESSION: 1. No acute findings. 2. Small amount of gas and stool in the rectum. Electronically Signed   By: Genevive Bi M.D.   On: 08/25/2022 16:58        Scheduled Meds:  acetaminophen  1,000 mg Oral Q6H   atorvastatin  20 mg Oral Daily   benztropine  0.5 mg Oral Daily   divalproex  500 mg Oral BID   enoxaparin (LOVENOX) injection  40 mg Subcutaneous Q24H   feeding supplement  1 Container Oral TID BM   modafinil  200 mg Oral BID WC   nicotine  21 mg Transdermal Daily   nystatin  5 mL Oral QID   paliperidone  6 mg Oral Daily   pantoprazole  40 mg Oral Daily   sodium chloride flush  3 mL Intravenous Q12H   tamsulosin  0.4 mg Oral Daily   traZODone  300 mg Oral QHS   vancomycin  125 mg Oral QID   Continuous Infusions:  sodium chloride Stopped (08/26/22 1735)   ceFEPime (MAXIPIME) IV     lactated ringers     lactated ringers     methocarbamol (ROBAXIN) IV     metronidazole 500 mg (08/26/22 2245)   ondansetron (ZOFRAN) IV     potassium chloride     sodium chloride       LOS: 5 days    Time spent: 35 Minutes    Dannah Ryles A Raeana Blinn, MD [facility]   If 7PM-7AM, please contact night-coverage [email or web address]  08/27/2022, 8:35 AM

## 2022-08-28 DIAGNOSIS — K5732 Diverticulitis of large intestine without perforation or abscess without bleeding: Secondary | ICD-10-CM | POA: Diagnosis not present

## 2022-08-28 NOTE — Progress Notes (Signed)
[facility] Gastroenterology Progress Note  Danielle Cox 62 y.o. [birth year]   Subjective: Stools soft. Decreased abdominal pain. Sleeping comfortably and easily wakes up.  Objective: Vital signs: Vitals:   08/28/22 0344 08/28/22 0841  BP: 138/84 125/61  Pulse: 83 81  Resp: 20 20  Temp: 98.6 F (37 C) 98.8 F (37.1 C)  SpO2: 91% 94%    Physical Exam: Gen: lethargic, well-nourished, no acute distress  HEENT: anicteric sclera CV: RRR Chest: CTA B Abd: mild diffuse tenderness with guarding, soft, nondistended, +BS Ext: no edema  Lab Results: Recent Labs    08/27/22 0418 08/27/22 0830 08/28/22 0436  NA  --  141 144  K  --  3.4* 3.6  CL  --  103 107  CO2  --  28 29  GLUCOSE  --  109* 123*  BUN  --  7 9  CREATININE  --  0.52 0.48  CALCIUM  --  8.1* 8.3*  MG 2.2  --   --    Recent Labs    08/26/22 2356  AST 19  ALT 24  ALKPHOS 44  BILITOT 0.4  PROT 5.4*  ALBUMIN 2.5*   Recent Labs    08/27/22 0418 08/28/22 0436  WBC 9.4 12.5*  HGB 12.7 11.7*  HCT 39.6 36.1  MCV 93.6 94.5  PLT 178 218      Assessment/Plan: Ischemic colitis in setting of recent C. Diff colitis and complicated diverticulitis - clinically improving on current antibiotic treatment. Clear liquid diet per surgery. Supportive care. No further GI recs. GI F/U with Dr. Jason Fila at [place]. Will sign off. Call us back if questions.   Shirley Friar 08/28/2022, 1:43 PM  Questions please call [phone] ID: Danielle Cox, female   DOB: November 09, 1960, 62 y.o.   MRN: [id number]

## 2022-08-28 NOTE — Progress Notes (Addendum)
Patient ID: Danielle Cox, female   DOB: 1960-06-25, 62 y.o.   MRN: [id number] [facility] Surgery Progress Note     Subjective: Patient reports she is overall feeling better. Having some formed stool, non-bloody. Some nausea but not worsening and pain is improved today.   Objective: Vital signs in last 24 hours: Temp:  [98.6 F (37 C)-100.9 F (38.3 C)] 98.8 F (37.1 C) (08/13 0841) Pulse Rate:  [68-95] 81 (08/13 0841) Resp:  [18-20] 20 (08/13 0841) BP: (125-149)/(61-84) 125/61 (08/13 0841) SpO2:  [90 %-94 %] 94 % (08/13 0841) Last BM Date : 08/27/22  Intake/Output from previous day: 08/12 0701 - 08/13 0700 In: 2456.7 [P.O.:220; I.V.:1836.7; IV Piggyback:400] Out: -  Intake/Output this shift: No intake/output data recorded.  PE: Gen:  Alert, NAD, pleasant Card:  RRR Pulm:  rate and effort normal on room air Abd: Soft, nondistended, mild diffuse tenderness but no peritonitis   Lab Results:  Recent Labs    08/27/22 0418 08/28/22 0436  WBC 9.4 12.5*  HGB 12.7 11.7*  HCT 39.6 36.1  PLT 178 218   BMET Recent Labs    08/27/22 0830 08/28/22 0436  NA 141 144  K 3.4* 3.6  CL 103 107  CO2 28 29  GLUCOSE 109* 123*  BUN 7 9  CREATININE 0.52 0.48  CALCIUM 8.1* 8.3*   PT/INR No results for input(s): "LABPROT", "INR" in the last 72 hours. CMP     Component Value Date/Time   NA 144 08/28/2022 0436   K 3.6 08/28/2022 0436   CL 107 08/28/2022 0436   CO2 29 08/28/2022 0436   GLUCOSE 123 (H) 08/28/2022 0436   BUN 9 08/28/2022 0436   CREATININE 0.48 08/28/2022 0436   CALCIUM 8.3 (L) 08/28/2022 0436   PROT 5.4 (L) 08/26/2022 2356   ALBUMIN 2.5 (L) 08/26/2022 2356   AST 19 08/26/2022 2356   ALT 24 08/26/2022 2356   ALKPHOS 44 08/26/2022 2356   BILITOT 0.4 08/26/2022 2356   GFRNONAA >60 08/28/2022 0436   GFRAA >60 05/15/2019 1246   Lipase     Component Value Date/Time   LIPASE 26 08/22/2022 1723       Studies/Results: CT ABDOMEN PELVIS W  CONTRAST  Result Date: 08/26/2022 CLINICAL DATA:  Inpatient. Abdominal pain, acute (Ped 0-17y). Recent diagnosis of sigmoid diverticulitis. EXAM: CT ABDOMEN AND PELVIS WITH CONTRAST TECHNIQUE: Multidetector CT imaging of the abdomen and pelvis was performed using the standard protocol following bolus administration of intravenous contrast. RADIATION DOSE REDUCTION: This exam was performed according to the departmental dose-optimization program which includes automated exposure control, adjustment of the mA and/or kV according to patient size and/or use of iterative reconstruction technique. CONTRAST:  OMNIPAQUE IOHEXOL 300 MG/ML  SOLN COMPARISON:  08/25/2022 abdominal radiograph. 08/22/2022 CT abdomen/pelvis. FINDINGS: Lower chest: Trace posterior bilateral pleural effusions, right greater than left, increased bilaterally. Similar trace pericardial effusion. Mild posterior bibasilar atelectasis. Healed lateral right eighth rib fracture. Hepatobiliary: Normal liver size. Simple 3.3 cm inferior left liver cyst with numerous additional subcentimeter hypodense liver lesions scattered throughout the liver that are too small to characterize and unchanged. No appreciable new liver lesions. Normal gallbladder with no radiopaque cholelithiasis. No biliary ductal dilatation. Pancreas: Normal, with no mass or duct dilation. Spleen: Normal size. No mass. Adrenals/Urinary Tract: Normal adrenals. No hydronephrosis. Simple 2.1 cm posterior lower left renal cyst with additional scattered subcentimeter hypodense left renal cortical lesions that are too small to characterize and are unchanged, for which  no follow-up imaging is recommended. Normal bladder. Stomach/Bowel: Normal non-distended stomach. Normal caliber small bowel with no small bowel wall thickening. Oral contrast transits to the left colon. Right upper quadrant cecum with normal appendix. Marked sigmoid diverticulosis with wall thickening throughout the mid  sigmoid colon with prominent pericolonic fat stranding and new pericolonic free air throughout the sigmoid mesentery, which appears to track along the sigmoid mesenteric veins into the inferior mesenteric vein on the coronal images (series 5/image 62). Inflammatory fat stranding extends throughout the anterior left paranephric retroperitoneum and inferiorly into the presacral space. No measurable pericolonic fluid collections. Vascular/Lymphatic: Atherosclerotic nonaneurysmal abdominal aorta. Patent portal, splenic, hepatic and renal veins. Mildly enlarged 1.4 cm porta hepatis node (series 3/image 34), stable. Newly mildly enlarged 1.7 cm portacaval node (series 3/image 37). Reproductive: Grossly normal uterus.  No adnexal mass. Other: No focal fluid collection. Musculoskeletal: No aggressive appearing focal osseous lesions. Moderate degenerative disc disease at L5-S1. IMPRESSION: 1. Unusual perforated acute sigmoid diverticulitis with new pericolonic free air tracking along the sigmoid mesenteric veins into the inferior mesenteric vein with worsened inflammatory fat stranding extending throughout the anterior left retroperitoneum and inferiorly into the presacral space. No measurable pericolonic fluid collections. Findings raise concern for development of ischemic colitis. 2. Trace posterior bilateral pleural effusions, right greater than left, increased bilaterally. Similar trace pericardial effusion. 3. Nonspecific mild porta hepatis and portacaval lymphadenopathy, mildly increased. 4.  Aortic Atherosclerosis (ICD10-I70.0). Critical Value/emergent results were called by telephone at the time of interpretation on 08/26/2022 at 4:40 pm to provider [facility] , who verbally acknowledged these results. Electronically Signed   By: Delbert Phenix M.D.   On: 08/26/2022 16:45    Anti-infectives: Anti-infectives (From admission, onward)    Start     Dose/Rate Route Frequency Ordered Stop   08/27/22 1400  ceFEPIme  (MAXIPIME) 2 g in sodium chloride 0.9 % 100 mL IVPB        2 g 200 mL/hr over 30 Minutes Intravenous Every 8 hours 08/27/22 0649     08/26/22 2200  metroNIDAZOLE (FLAGYL) IVPB 500 mg        500 mg 100 mL/hr over 60 Minutes Intravenous 2 times daily 08/26/22 1652 09/05/22 2159   08/26/22 1745  ceFEPIme (MAXIPIME) 2 g in sodium chloride 0.9 % 100 mL IVPB  Status:  Discontinued       Note to Pharmacy: Pharmacy may adjust dosing strength, schedule, rate of infusion, etc as needed to optimize therapy   2 g 200 mL/hr over 30 Minutes Intravenous Every 8 hours 08/26/22 1649 08/27/22 0647   08/24/22 1400  vancomycin (VANCOCIN) capsule 125 mg        125 mg Oral 4 times daily 08/24/22 1235 09/03/22 1359   08/23/22 2000  cefTRIAXone (ROCEPHIN) 2 g in sodium chloride 0.9 % 100 mL IVPB  Status:  Discontinued        2 g 200 mL/hr over 30 Minutes Intravenous Every 24 hours 08/23/22 0542 08/26/22 1649   08/23/22 1436  cefTRIAXone (ROCEPHIN) 2 g in sodium chloride 0.9 % 100 mL IVPB  Status:  Discontinued        2 g 200 mL/hr over 30 Minutes Intravenous Every 24 hours 08/23/22 1436 08/23/22 1442   08/23/22 1436  metroNIDAZOLE (FLAGYL) IVPB 500 mg  Status:  Discontinued        500 mg 100 mL/hr over 60 Minutes Intravenous Every 12 hours 08/23/22 1436 08/23/22 1441   08/23/22 0800  metroNIDAZOLE (FLAGYL) IVPB 500  mg  Status:  Discontinued        500 mg 100 mL/hr over 60 Minutes Intravenous Every 12 hours 08/23/22 0542 08/26/22 1652   08/22/22 1915  cefTRIAXone (ROCEPHIN) 2 g in sodium chloride 0.9 % 100 mL IVPB        2 g 200 mL/hr over 30 Minutes Intravenous  Once 08/22/22 1900 08/22/22 2032   08/22/22 1915  metroNIDAZOLE (FLAGYL) IVPB 500 mg        500 mg 100 mL/hr over 60 Minutes Intravenous  Once 08/22/22 1900 08/22/22 2123        Assessment/Plan Ischemic colitis or worsening sigmoid diverticulitis with microperforation C diff colitis - repeat CT 8/11 with perforated acute sigmoid diverticulitis  with new pericolonic free air tracking along the sigmoid mesenteric veins into the inferior mesenteric vein with worsened inflammatory fat stranding extending throughout the anterior left retroperitoneum and inferiorly into the presacral space; no measurable pericolonic fluid collections; findings raise concern for development of ischemic colitis - Antibiotics switched to cefepime and flagyl 8/11. She is on oral vancomycin for C diff. GI panel also positive for E coli - Fever curve a little better although WBC up slightly, recommend repeat CBC in AM. Ok to trial CLD since pain is improved today. We will follow closely. We discussed that if she were to worsen she may require surgery for partial colectomy/colostomy.   ID - cefepime and flagyl 8/11>>. Oral vancomycin 8/9>> FEN - IVF, CLD VTE - lovenox Foley - none  COPD GERD HLD Anxiety, depression, Bipolar  I reviewed Consultant GI notes, hospitalist notes, last 24 h vitals and pain scores, last 48 h intake and output, and last 24 h labs and trends.    LOS: 6 days    Juliet Rude, [facility] Surgery 08/28/2022, 9:42 AM Please see Amion for pager number during day hours 7:00am-4:30pm

## 2022-08-28 NOTE — Care Management Important Message (Signed)
Important Message  Patient Details IM Letter given. Name: Danielle Cox MRN: [id number] Date of Birth: 06/25/1960   Medicare Important Message Given:  Yes     Caren Macadam 08/28/2022, 11:40 AM

## 2022-08-28 NOTE — Plan of Care (Signed)
°  Problem: Fluid Volume: °Goal: Hemodynamic stability will improve °Outcome: Progressing °  °Problem: Clinical Measurements: °Goal: Diagnostic test results will improve °Outcome: Progressing °Goal: Signs and symptoms of infection will decrease °Outcome: Progressing °  °

## 2022-08-28 NOTE — Progress Notes (Signed)
Pharmacy: blood culture result   Micro lab called for an update. Patient now has 2/4 blood culture bottles with Eubacterium Linosum (gram positive anaerobe) and 1/4 bottles with GNR (nothing on BCID). The flagyl will cover for Eubacterium. Recom to cont with cefepime for now for the GNR until the organism is identified.   Informed Dr. Sunnie Nielsen of blood culture results and recom.  Dorna Leitz, PharmD, BCPS 08/28/2022 11:01 AM'

## 2022-08-28 NOTE — Progress Notes (Signed)
PROGRESS NOTE    Danielle Cox  ZOX:[id number] DOB: August 03, 1960 DOA: 08/22/2022 PCP: [name], MD   Brief Narrative: 62 year old with past medical history significant for anxiety, depression, bipolar, COPD presents for evaluation of diarrhea she has developed diarrhea since a month ago more than 10 episodes per day refractory to Flagyl, Imodium, Lomotil and Bentyl.  Initially stool culture were negative.  She developed fever the day of admission at 102, report 15 pounds weight loss.  Patient admitted with sepsis secondary to sigmoid diverticulitis, she has been having diarrhea for a month, she was evaluated as an outpatient at [facility] clinic and was found to have C. difficile toxin positive.  On admission CT abdomen and pelvis consistent with sigmoid diverticulitis, she was also started on IV ceftriaxone and Flagyl, and oral vancomycin.  Repeated CT abdomen and pelvis 8/11 due to persistent low-grade fever and abdominal pain consistent with ischemic colitis versus perforated diverticulitis.  Surgery consulted recommended change antibiotics to cefepime and Flagyl.  GI was also consulted, with no further recommendation. Blood culture now growing: 2/4 Eubacterium Linosum and 1/4 GNR pending BCID.    Assessment & Plan:   Principal Problem:   Diverticulitis of colon with perforation Active Problems:   Bipolar affective disorder (HCC)   Nicotine dependence with current use   Essential hypertension   Personality disorder (HCC)   Fibromyalgia   COPD (chronic obstructive pulmonary disease) (HCC)   Seasonal allergic rhinitis due to pollen   Insulin resistance   Dyslipidemia   Tardive dyskinesia   SIRS (systemic inflammatory response syndrome) (HCC)   Adenomatous polyp of ascending colon   Tobacco use disorder   Insomnia, unspecified   Current every day smoker   Chronic diarrhea   History of Clostridioides difficile colitis   Gastropathy   Bronchitis, chronic (HCC)  1-Sepsis secondary  to Sigmoid Diverticulitis:  Eubacterium Limosum Bacteremia.  GNR Bacteremia  -Patient presented with diarrhea, fever 101, tachycardia heart rate 110, tachypnea, white blood cell 13.  CT abdomen and pelvis: Sigmoid diverticulitis.  No abscess or obstruction. Numerous cysts in the liver as well as left kidney. Non-obstructing left renal stones. -Outpatient  workup: Ova and parasites negative, stool culture negative - C diff not accepted by lab stool was not watery.  -GI pathogen positive for E coli/ enterotoxigenic.  -Treated with IV ceftriaxone and Flagyl for diverticulitis until 8/11--antibiotics change to Cefepime and flagyl 8/11..  -Started  Oral vancomycin 8/09. Patient was seen at [facility] and Had C diff Toxine Positive. Results has not been faxed. Discussed with Laural Golden PA [facility] 8/09.  -8/11: Develops worsening  abdominal pain, still with low grade fever. Repeated CT scan showed perforated diverticulitis. Surgery consul;ted. IV antibiotics change to Cefepime plus Flagyl.  -GI has been consulted. No further recommendations.  -Blood culture growing GNR and Eubacterium Limosum -She report improvement of abdominal pain today, she was ambulating in the hall. Started on clear diet by Sx.   C diff Colitis. Started  Orval Vancomycin 8/09  History of anxiety, depression, bipolar Continue with Cogentin, Depakote, Provigil, Invega, temazepam, trazodone  Hypokalemia; Replaced.   Hyperlipidemia: -On statins.   GERD: -on PPI.   History of COPD: -PRN nebulizer.   Difficulty Voiding.  Had episode of difficulty voiding yesterday.  Started on Flomax.  No further issues  Estimated body mass index is 30.56 kg/m as calculated from the following:   Height as of this encounter: 5\' 5"  (1.651 m).   Weight as of this encounter: 83.3  kg.   DVT prophylaxis: SCD Code Status: Full code Family Communication: Care discussed with patient. Husband  8/12 Disposition Plan:  Status is:  Inpatient Remains inpatient appropriate because: management of sigmoid diverticulitis.     Consultants:  None  Procedures:  None  Antimicrobials:    Subjective: She is alert, feeling better. Abdominal pain improved.  She was walking in the hall.    Objective: Vitals:   08/27/22 2202 08/28/22 0025 08/28/22 0344 08/28/22 0841  BP:   138/84 125/61  Pulse:   83 81  Resp:   20 20  Temp: (!) 100.5 F (38.1 C) 99.8 F (37.7 C) 98.6 F (37 C) 98.8 F (37.1 C)  TempSrc: Oral Oral Oral Oral  SpO2:   91% 94%  Weight:      Height:        Intake/Output Summary (Last 24 hours) at 08/28/2022 1343 Last data filed at 08/28/2022 0551 Gross per 24 hour  Intake 2456.67 ml  Output --  Net 2456.67 ml   Filed Weights   08/23/22 1737  Weight: 83.3 kg    Examination:  General exam: NAD Respiratory system; CTA Cardiovascular system: S 1, S 2 RRR Gastrointestinal system: BS present, soft, tender.   Central nervous system: Alert, follows command Extremities: no edema    Data Reviewed: I have personally reviewed following labs and imaging studies  CBC: Recent Labs  Lab 08/23/22 1451 08/25/22 0926 08/26/22 0812 08/27/22 0418 08/28/22 0436  WBC 10.8* 8.2 9.1 9.4 12.5*  HGB 12.7 11.9* 12.7 12.7 11.7*  HCT 39.3 36.7 38.9 39.6 36.1  MCV 94.0 92.7 92.2 93.6 94.5  PLT 148* 144* 167 178 218   Basic Metabolic Panel: Recent Labs  Lab 08/23/22 1451 08/24/22 0856 08/25/22 0926 08/26/22 0812 08/26/22 2356 08/27/22 0418 08/27/22 0830 08/28/22 0436  NA 138   < > 141 141 140  --  141 144  K 3.1*   < > 3.5 4.1 3.2*  --  3.4* 3.6  CL 107   < > 104 104 105  --  103 107  CO2 24   < > 27 26 26   --  28 29  GLUCOSE 148*   < > 107* 105* 111*  --  109* 123*  BUN 11   < > 5* 5* 7  --  7 9  CREATININE 0.61   < > 0.50 0.55 0.51  --  0.52 0.48  CALCIUM 8.3*   < > 8.3* 8.5* 8.0*  --  8.1* 8.3*  MG 2.0  --   --   --   --  2.2  --   --    < > = values in this interval not displayed.    GFR: Estimated Creatinine Clearance: 79.7 mL/min (by C-G formula based on SCr of 0.48 mg/dL). Liver Function Tests: Recent Labs  Lab 08/22/22 1723 08/24/22 0856 08/26/22 2356  AST 14* 28 19  ALT 16 31 24   ALKPHOS 46 46 44  BILITOT 0.7 0.4 0.4  PROT 6.3* 6.1* 5.4*  ALBUMIN 3.3* 2.9* 2.5*   Recent Labs  Lab 08/22/22 1723  LIPASE 26   No results for input(s): "AMMONIA" in the last 168 hours. Coagulation Profile: Recent Labs  Lab 08/23/22 1451  INR 1.2   Cardiac Enzymes: No results for input(s): "CKTOTAL", "CKMB", "CKMBINDEX", "TROPONINI" in the last 168 hours. BNP (last 3 results) No results for input(s): "PROBNP" in the last 8760 hours. HbA1C: No results for input(s): "HGBA1C" in the last 72  hours. CBG: No results for input(s): "GLUCAP" in the last 168 hours. Lipid Profile: No results for input(s): "CHOL", "HDL", "LDLCALC", "TRIG", "CHOLHDL", "LDLDIRECT" in the last 72 hours. Thyroid Function Tests: No results for input(s): "TSH", "T4TOTAL", "FREET4", "T3FREE", "THYROIDAB" in the last 72 hours. Anemia Panel: No results for input(s): "VITAMINB12", "FOLATE", "FERRITIN", "TIBC", "IRON", "RETICCTPCT" in the last 72 hours. Sepsis Labs: Recent Labs  Lab 08/22/22 1829 08/23/22 1438  LATICACIDVEN 1.3 1.7    Recent Results (from the past 240 hour(s))  Blood culture (routine x 2)     Status: Abnormal (Preliminary result)   Collection Time: 08/22/22  6:29 PM   Specimen: BLOOD  Result Value Ref Range Status   Specimen Description   Final    BLOOD RIGHT ANTECUBITAL Performed at [facility], [address], [place], [place] [address]    Special Requests   Final    BOTTLES DRAWN AEROBIC AND ANAEROBIC Blood Culture adequate volume Performed at [facility], [address], [place], [place] [address]    Culture  Setup Time   Final    GRAM POSITIVE RODS ANAEROBIC BOTTLE ONLY CRITICAL RESULT CALLED TO, READ BACK BY AND VERIFIED  WITH: PHARMD AMY GREGORY [id number] AT 1948 BY EC GRAM NEGATIVE RODS AEROBIC BOTTLE ONLY CRITICAL RESULT CALLED TO, READ BACK BY AND VERIFIED WITH: Francesco Runner PHARMD, AT 2725 08/28/22 D. VANHOOK    Culture (A)  Final    EUBACTERIUM LIMOSUM GRAM NEGATIVE RODS IDENTIFICATION AND SUSCEPTIBILITIES TO FOLLOW Performed at [facility], [address] [place], [place] [address]    Report Status PENDING  Incomplete  Blood culture (routine x 2)     Status: Abnormal   Collection Time: 08/22/22  6:30 PM   Specimen: BLOOD  Result Value Ref Range Status   Specimen Description   Final    BLOOD LEFT ANTECUBITAL Performed at [facility], [address] [place], [place] [address]    Special Requests   Final    BOTTLES DRAWN AEROBIC AND ANAEROBIC Blood Culture results may not be optimal due to an excessive volume of blood received in culture bottles Performed at [facility], [address] [place], [place] [address]    Culture  Setup Time   Final    GRAM POSITIVE RODS ANAEROBIC BOTTLE ONLY CRITICAL VALUE NOTED.  VALUE IS CONSISTENT WITH PREVIOUSLY REPORTED AND CALLED VALUE. Performed at [facility], [address], [place], [place] [address]    Culture EUBACTERIUM LIMOSUM (A)  Final   Report Status 08/28/2022 FINAL  Final  SARS Coronavirus 2 by RT PCR (hospital order, performed in [facility] hospital lab) *cepheid single result test* Anterior Nasal Swab     Status: None   Collection Time: 08/22/22  7:21 PM   Specimen: Anterior Nasal Swab  Result Value Ref Range Status   SARS Coronavirus 2 by RT PCR NEGATIVE NEGATIVE Final    Comment: (NOTE) SARS-CoV-2 target nucleic acids are NOT DETECTED.  The SARS-CoV-2 RNA is generally detectable in upper and lower respiratory specimens during the acute phase of infection. The lowest concentration of SARS-CoV-2 viral copies this assay can detect is 250 copies / mL. A negative result does not preclude  SARS-CoV-2 infection and should not be used as the sole basis for treatment or other patient management decisions.  A negative result may occur with improper specimen collection / handling, submission of specimen other than nasopharyngeal swab, presence of viral mutation(s)  within the areas targeted by this assay, and inadequate number of viral copies (<250 copies / mL). A negative result must be combined with clinical observations, patient history, and epidemiological information.  Fact Sheet for Patients:   RoadLapTop.co.za  Fact Sheet for Healthcare Providers: http://kim-miller.com/  This test is not yet approved or  cleared by the [place] FDA and has been authorized for detection and/or diagnosis of SARS-CoV-2 by FDA under an Emergency Use Authorization (EUA).  This EUA will remain in effect (meaning this test can be used) for the duration of the COVID-19 declaration under Section 564(b)(1) of the Act, 21 U.S.C. section 360bbb-3(b)(1), unless the authorization is terminated or revoked sooner.  Performed at [facility], [address], [place], [place] [address]   Gastrointestinal Panel by PCR , Stool     Status: Abnormal   Collection Time: 08/23/22 12:24 PM   Specimen: Stool  Result Value Ref Range Status   Campylobacter species NOT DETECTED NOT DETECTED Final   Plesimonas shigelloides NOT DETECTED NOT DETECTED Final   Salmonella species NOT DETECTED NOT DETECTED Final   Yersinia enterocolitica NOT DETECTED NOT DETECTED Final   Vibrio species NOT DETECTED NOT DETECTED Final   Vibrio cholerae NOT DETECTED NOT DETECTED Final   Enteroaggregative E coli (EAEC) NOT DETECTED NOT DETECTED Final   Enteropathogenic E coli (EPEC) DETECTED (A) NOT DETECTED Final    Comment: RESULT CALLED TO, READ BACK BY AND VERIFIED WITH: JAKE RIMANDO @0621  08/24/22 MJU    Enterotoxigenic E coli (ETEC) DETECTED (A) NOT DETECTED  Final    Comment: RESULT CALLED TO, READ BACK BY AND VERIFIED WITH: JAKE RIMANDO @0621  08/24/22 MJU    Shiga like toxin producing E coli (STEC) NOT DETECTED NOT DETECTED Final   Shigella/Enteroinvasive E coli (EIEC) NOT DETECTED NOT DETECTED Final   Cryptosporidium NOT DETECTED NOT DETECTED Final   Cyclospora cayetanensis NOT DETECTED NOT DETECTED Final   Entamoeba histolytica NOT DETECTED NOT DETECTED Final   Giardia lamblia NOT DETECTED NOT DETECTED Final   Adenovirus F40/41 NOT DETECTED NOT DETECTED Final   Astrovirus NOT DETECTED NOT DETECTED Final   Norovirus GI/GII NOT DETECTED NOT DETECTED Final   Rotavirus A NOT DETECTED NOT DETECTED Final   Sapovirus (I, II, IV, and V) NOT DETECTED NOT DETECTED Final    Comment: Performed at [facility], [address] [place], [place] [address]         Radiology Studies: No results found.      Scheduled Meds:  acetaminophen  1,000 mg Oral Q6H   atorvastatin  20 mg Oral Daily   benztropine  0.5 mg Oral Daily   divalproex  500 mg Oral BID   enoxaparin (LOVENOX) injection  40 mg Subcutaneous Q24H   modafinil  200 mg Oral BID WC   nicotine  21 mg Transdermal Daily   nystatin  5 mL Oral QID   paliperidone  6 mg Oral Daily   pantoprazole  40 mg Oral Daily   sodium chloride flush  3 mL Intravenous Q12H   tamsulosin  0.4 mg Oral Daily   traZODone  300 mg Oral QHS   vancomycin  125 mg Oral QID   Continuous Infusions:  sodium chloride Stopped (08/26/22 1735)   ceFEPime (MAXIPIME) IV 2 g (08/28/22 0547)   lactated ringers     lactated ringers 100 mL/hr at 08/28/22 1033   methocarbamol (ROBAXIN) IV     metronidazole 500 mg (08/28/22 1032)   ondansetron (ZOFRAN)  IV       LOS: 6 days    Time spent: 35 Minutes    Jude Naclerio A Mont Jagoda, MD [facility]   If 7PM-7AM, please contact night-coverage [email or web address]  08/28/2022, 1:43 PM

## 2022-08-29 DIAGNOSIS — K5720 Diverticulitis of large intestine with perforation and abscess without bleeding: Secondary | ICD-10-CM | POA: Diagnosis not present

## 2022-08-29 MED ORDER — POTASSIUM CHLORIDE CRYS ER 20 MEQ PO TBCR
40.0000 meq | EXTENDED_RELEASE_TABLET | Freq: Two times a day (BID) | ORAL | Status: DC
  Administered 2022-08-29 – 2022-08-31 (×5): 40 meq via ORAL
  Filled 2022-08-29 (×5): qty 2

## 2022-08-29 MED ORDER — BOOST / RESOURCE BREEZE PO LIQD CUSTOM
1.0000 | Freq: Two times a day (BID) | ORAL | Status: DC
  Administered 2022-08-29 – 2022-08-31 (×3): 1 via ORAL

## 2022-08-29 NOTE — Progress Notes (Signed)
Mobility Specialist - Progress Note   08/29/22 1049  Mobility  Activity Ambulated independently in hallway  Level of Assistance Independent after set-up  Assistive Device None  Distance Ambulated (ft) 350 ft  Range of Motion/Exercises Active  Activity Response Tolerated well  Mobility Referral Yes  $Mobility charge 1 Mobility  Mobility Specialist Start Time (ACUTE ONLY) 1039  Mobility Specialist Stop Time (ACUTE ONLY) 1049  Mobility Specialist Time Calculation (min) (ACUTE ONLY) 10 min   Pt was found in bed and agreeable to ambulate. No complaints with session. Returned to recliner chair with all needs met. RN notified.  Billey Chang Mobility Specialist

## 2022-08-29 NOTE — Progress Notes (Signed)
Patient ID: Danielle Cox, female   DOB: [birth year], 62 y.o.   MRN: [id number] [facility] Surgery Progress Note     Subjective: Denies pain currently.  Had some mild pain this morning but took a tylenol with resolution.  Having BMs.  No nausea.  Tolerating CLD  Objective: Vital signs in last 24 hours: Temp:  [98.4 F (36.9 C)-98.8 F (37.1 C)] 98.6 F (37 C) (08/14 0804) Pulse Rate:  [65-88] 88 (08/14 0804) Resp:  [19-20] 20 (08/14 0804) BP: (123-127)/(60-76) 127/76 (08/14 0804) SpO2:  [92 %-94 %] 92 % (08/14 0804) Last BM Date : 08/28/22  Intake/Output from previous day: 08/13 0701 - 08/14 0700 In: 360 [P.O.:360] Out: 5 [Urine:3; Stool:2] Intake/Output this shift: No intake/output data recorded.  PE: Gen:  Alert, NAD, pleasant Pulm:  rate and effort normal on room air Abd: Soft, nondistended, not really tender today  Lab Results:  Recent Labs    08/28/22 0436 08/29/22 0416  WBC 12.5* 12.1*  HGB 11.7* 11.4*  HCT 36.1 36.3  PLT 218 298   BMET Recent Labs    08/28/22 0436 08/29/22 0416  NA 144 141  K 3.6 3.1*  CL 107 105  CO2 29 28  GLUCOSE 123* 93  BUN 9 7  CREATININE 0.48 0.47  CALCIUM 8.3* 7.8*   PT/INR No results for input(s): "LABPROT", "INR" in the last 72 hours. CMP     Component Value Date/Time   NA 141 08/29/2022 0416   K 3.1 (L) 08/29/2022 0416   CL 105 08/29/2022 0416   CO2 28 08/29/2022 0416   GLUCOSE 93 08/29/2022 0416   BUN 7 08/29/2022 0416   CREATININE 0.47 08/29/2022 0416   CALCIUM 7.8 (L) 08/29/2022 0416   PROT 5.4 (L) 08/26/2022 2356   ALBUMIN 2.5 (L) 08/26/2022 2356   AST 19 08/26/2022 2356   ALT 24 08/26/2022 2356   ALKPHOS 44 08/26/2022 2356   BILITOT 0.4 08/26/2022 2356   GFRNONAA >60 08/29/2022 0416   GFRAA >60 05/15/2019 1246   Lipase     Component Value Date/Time   LIPASE 26 08/22/2022 1723       Studies/Results: No results found.  Anti-infectives: Anti-infectives (From admission, onward)     Start     Dose/Rate Route Frequency Ordered Stop   08/27/22 1400  ceFEPIme (MAXIPIME) 2 g in sodium chloride 0.9 % 100 mL IVPB        2 g 200 mL/hr over 30 Minutes Intravenous Every 8 hours 08/27/22 0649     08/26/22 2200  metroNIDAZOLE (FLAGYL) IVPB 500 mg        500 mg 100 mL/hr over 60 Minutes Intravenous 2 times daily 08/26/22 1652 09/05/22 2159   08/26/22 1745  ceFEPIme (MAXIPIME) 2 g in sodium chloride 0.9 % 100 mL IVPB  Status:  Discontinued       Note to Pharmacy: Pharmacy may adjust dosing strength, schedule, rate of infusion, etc as needed to optimize therapy   2 g 200 mL/hr over 30 Minutes Intravenous Every 8 hours 08/26/22 1649 08/27/22 0647   08/24/22 1400  vancomycin (VANCOCIN) capsule 125 mg        125 mg Oral 4 times daily 08/24/22 1235 09/03/22 1359   08/23/22 2000  cefTRIAXone (ROCEPHIN) 2 g in sodium chloride 0.9 % 100 mL IVPB  Status:  Discontinued        2 g 200 mL/hr over 30 Minutes Intravenous Every 24 hours 08/23/22 0542 08/26/22 1649   08/23/22  1436  cefTRIAXone (ROCEPHIN) 2 g in sodium chloride 0.9 % 100 mL IVPB  Status:  Discontinued        2 g 200 mL/hr over 30 Minutes Intravenous Every 24 hours 08/23/22 1436 08/23/22 1442   08/23/22 1436  metroNIDAZOLE (FLAGYL) IVPB 500 mg  Status:  Discontinued        500 mg 100 mL/hr over 60 Minutes Intravenous Every 12 hours 08/23/22 1436 08/23/22 1441   08/23/22 0800  metroNIDAZOLE (FLAGYL) IVPB 500 mg  Status:  Discontinued        500 mg 100 mL/hr over 60 Minutes Intravenous Every 12 hours 08/23/22 0542 08/26/22 1652   08/22/22 1915  cefTRIAXone (ROCEPHIN) 2 g in sodium chloride 0.9 % 100 mL IVPB        2 g 200 mL/hr over 30 Minutes Intravenous  Once 08/22/22 1900 08/22/22 2032   08/22/22 1915  metroNIDAZOLE (FLAGYL) IVPB 500 mg        500 mg 100 mL/hr over 60 Minutes Intravenous  Once 08/22/22 1900 08/22/22 2123        Assessment/Plan Ischemic colitis or worsening sigmoid diverticulitis with microperforation C  diff colitis - repeat CT 8/11 with perforated acute sigmoid diverticulitis with new pericolonic free air tracking along the sigmoid mesenteric veins into the inferior mesenteric vein with worsened inflammatory fat stranding extending throughout the anterior left retroperitoneum and inferiorly into the presacral space; no measurable pericolonic fluid collections; findings raise concern for development of ischemic colitis - Antibiotics switched to cefepime and flagyl 8/11. She is on oral vancomycin for C diff. GI panel also positive for E coli - no further fevers.  WBC stable at 12. - pain seems to be improving and doing well on CLD.  Adv to FLD and continue to monitor   ID - cefepime and flagyl 8/11>>. Oral vancomycin 8/9>> FEN - IVF, FLD VTE - lovenox Foley - none  COPD GERD HLD Anxiety, depression, Bipolar  I reviewed Consultant GI notes, hospitalist notes, last 24 h vitals and pain scores, last 48 h intake and output, and last 24 h labs and trends.    LOS: 7 days    Letha Cape, [facility] 08/29/2022, 10:01 AM Please see Amion for pager number during day hours 7:00am-4:30pm

## 2022-08-29 NOTE — Plan of Care (Signed)
Pt tearful at time on and off through out the shift. C/o 5/10 abdominal pain this morning but wanted to take her sch tylenol and see if that would help the pain. Pt was resting prior to this. Pt continues to have small type 6 BM throughout the night.  No c/o lightheadedness, dizziness, CP, or SOB.   Problem: Fluid Volume: Goal: Hemodynamic stability will improve Outcome: Progressing   Problem: Clinical Measurements: Goal: Diagnostic test results will improve Outcome: Progressing Goal: Signs and symptoms of infection will decrease Outcome: Progressing   Problem: Respiratory: Goal: Ability to maintain adequate ventilation will improve Outcome: Progressing   Problem: Education: Goal: Knowledge of General Education information will improve Description: Including pain rating scale, medication(s)/side effects and non-pharmacologic comfort measures Outcome: Progressing   Problem: Health Behavior/Discharge Planning: Goal: Ability to manage health-related needs will improve Outcome: Progressing   Problem: Activity: Goal: Risk for activity intolerance will decrease Outcome: Progressing   Problem: Elimination: Goal: Will not experience complications related to bowel motility Outcome: Progressing

## 2022-08-29 NOTE — Progress Notes (Signed)
Chaplain engaged in an initial visit with Syvannah. Wadie voiced that she had recently went on a family vacation and was unable to engage in any of the activities with her family due to being extremely sick. This weighed heavy on her as she finds a lot of joy in spending time with her granddaughter, Cristy Friedlander. Vaudine noted that she feels like she is experiencing some symptoms of her manic depression. Chaplain could assess that Alawna has been tearful and has found it hard to come to a place of peace in her emotions. Chaplain affirmed that healthcare crises physically can trigger mental healthcare crises as well. Chaplain let nurse know of Paulita's belief that she was experiencing additional symptoms.   Chaplain prayed over [place], offered compassionate and supportive presence, and reached out to her nurse for additional support.    08/29/22 1100  Spiritual Encounters  Type of Visit Initial  Care provided to: Patient  Reason for visit Routine spiritual support  Spiritual Framework  Presenting Themes Significant life change;Community and relationships;Rituals and practive;Impactful experiences and emotions  Community/Connection Family  Needs/Challenges/Barriers Manic Depression Symptoms  Interventions  Spiritual Care Interventions Made Established relationship of care and support;Compassionate presence;Reflective listening;Prayer;Encouragement;Normalization of emotions  Intervention Outcomes  Outcomes Awareness of support;Connection to spiritual care;Reduced fear;Reduced isolation

## 2022-08-29 NOTE — Plan of Care (Signed)
  Problem: Clinical Measurements: Goal: Diagnostic test results will improve Outcome: Progressing Goal: Signs and symptoms of infection will decrease Outcome: Progressing   Problem: Respiratory: Goal: Ability to maintain adequate ventilation will improve Outcome: Progressing   

## 2022-08-29 NOTE — Progress Notes (Signed)
HOSPITALIST ROUNDING NOTE Danielle Cox GNF:[id number]  DOB: 10-Jul-1960  DOA: 08/22/2022  PCP: [name], MD  08/29/2022,3:37 PM   LOS: 7 days      Code Status: Full   From: Home  current Dispo: Likely home     62 year old white female Bipolar COPD colonic polyps H. pylori gastritis on EGD 04/2020 Chronic diarrhea previously on dicyclomine 20 3 times daily previously as well as colestipol-stool studies at [facility] negative for infection pancreatic elastase low Seen by [name] PA [facility] 8/6 nausea vomiting diarrhea 10 bowel movements a day E. coli C. difficile were negative-Rx previously Lomotil Phenergan-GI pathogen panel was ordered patient was restarted on dicyclomine Lomotil and lab work was ordered-she developed fever on 8/7 and had 15 pound weight loss-outpatient C. difficile apparently returned positive subsequently   Brought to emergency room with further diarrhea CT abdomen pelvis showed sigmoid diverticulitis at admission 8/7 Was started on Rocephin Flagyl on admission and blood stool and C. difficile were obtained and patient was also started on oral Vanco 8/11 repeat CT secondary to low-grade fever abdominal pain?  Ischemic versus perforated diverticulitis-General Surgery consulted 8/12 GI was consulted secondary to?  Ischemic colitis-felt no role for sigmoid colonoscopy  Plan  Sepsis on admission secondary to sigmoid diverticulitis with Eubacterium millimole some bacteria and GNR bacteremia GI pathogen panel significant for E. coli enterotoxigenic and was treated with broad-spectrum antibiotics and changed to cefepime and Flagyl 811 C. difficile +8/9 so will cover with oral Vanco for 2 weeks post treatment with IV antibiotics White count has remained elevated for 2 days we will defer to surgery when to repeat scan of abdomen-he is nontoxic-appearing and does not appear to have comfort Saline lock IV High dose Zofran seems to be on board and IV infusion so we will  monitor  History of anxiety depression and bipolar Significant polypharmacy additionally Patient is on Seroquel 100 twice daily which has been held, Invega 6 mg at bedtime Depakote 500 twice daily Cogentin 0.5 daily trazodone 300 at bedtime Restoril 30 at bedtime Paradoxically needs to be on modafinil 200 a.m/p.m. to keep awake it sounds like Needs outpatient de-escalation of meds I will not add any meds at this time-chaplain to come by and continue to support her emotionally when able  Mild hypokalemia Replace with K-Lor 40 twice daily-magnesium  Urinary retention Started on Flomax 0.4 on 8/10 May need alternate meds but hesitate to add given polypharmacy with several psych meds which can worsen this    DVT prophylaxis: SCD  Status is: Inpatient Remains inpatient appropriate because:   Requires further improvement may need further imaging    Subjective: Awake coherent slight flat affect Very emotional today according to nursing  Objective + exam Vitals:   08/28/22 2012 08/29/22 0521 08/29/22 0804 08/29/22 1455  BP: 123/60 123/62 127/76 139/70  Pulse: 74 65 88 75  Resp: 19 19 20  (!) 23  Temp: 98.8 F (37.1 C) 98.6 F (37 C) 98.6 F (37 C) 98.6 F (37 C)  TempSrc: Oral Oral Oral Oral  SpO2: 93% 94% 92% 92%  Weight:      Height:       Filed Weights   08/23/22 1737  Weight: 83.3 kg    Examination:  Flat affect edentulous EOMI NCAT no thyroid megaly Chest clear no added sound no rales or rhonchi Abdomen soft no rebound no guarding No lower extremity edema Neuro intact moving 4 limbs equally  Data Reviewed: reviewed   CBC  Component Value Date/Time   WBC 12.1 (H) 08/29/2022 0416   RBC 3.86 (L) 08/29/2022 0416   HGB 11.4 (L) 08/29/2022 0416   HCT 36.3 08/29/2022 0416   PLT 298 08/29/2022 0416   MCV 94.0 08/29/2022 0416   MCH 29.5 08/29/2022 0416   MCHC 31.4 08/29/2022 0416   RDW 14.9 08/29/2022 0416   LYMPHSABS 2.8 08/06/2022 1517   MONOABS 1.0  08/06/2022 1517   EOSABS 0.2 08/06/2022 1517   BASOSABS 0.1 08/06/2022 1517      Latest Ref Rng & Units 08/29/2022    4:16 AM 08/28/2022    4:36 AM 08/27/2022    8:30 AM  CMP  Glucose 70 - 99 mg/dL 93  161  096   BUN 6 - 20 mg/dL 7  9  7    Creatinine 0.44 - 1.00 mg/dL 0.45  4.09  8.11   Sodium 135 - 145 mmol/L 141  144  141   Potassium 3.5 - 5.1 mmol/L 3.1  3.6  3.4   Chloride 98 - 111 mmol/L 105  107  103   CO2 22 - 32 mmol/L 28  29  28    Calcium 8.9 - 10.3 mg/dL 7.8  8.3  8.1      Scheduled Meds:  acetaminophen  1,000 mg Oral Q6H   atorvastatin  20 mg Oral Daily   benztropine  0.5 mg Oral Daily   divalproex  500 mg Oral BID   enoxaparin (LOVENOX) injection  40 mg Subcutaneous Q24H   feeding supplement  1 Container Oral BID BM   modafinil  200 mg Oral BID WC   nicotine  21 mg Transdermal Daily   nystatin  5 mL Oral QID   paliperidone  6 mg Oral Daily   pantoprazole  40 mg Oral Daily   potassium chloride  40 mEq Oral BID   sodium chloride flush  3 mL Intravenous Q12H   tamsulosin  0.4 mg Oral Daily   traZODone  300 mg Oral QHS   vancomycin  125 mg Oral QID   Continuous Infusions:  sodium chloride Stopped (08/26/22 1735)   ceFEPime (MAXIPIME) IV 2 g (08/29/22 1438)   methocarbamol (ROBAXIN) IV     metronidazole 500 mg (08/29/22 1117)   ondansetron (ZOFRAN) IV      Time  22  Rhetta Mura, MD  [facility]

## 2022-08-30 DIAGNOSIS — K5720 Diverticulitis of large intestine with perforation and abscess without bleeding: Secondary | ICD-10-CM | POA: Diagnosis not present

## 2022-08-30 LAB — BASIC METABOLIC PANEL
Anion gap: 5 (ref 5–15)
BUN: 6 mg/dL (ref 6–20)
CO2: 28 mmol/L (ref 22–32)
Calcium: 8 mg/dL — ABNORMAL LOW (ref 8.9–10.3)
Chloride: 106 mmol/L (ref 98–111)
Creatinine, Ser: 0.55 mg/dL (ref 0.44–1.00)
GFR, Estimated: >60 mL/min (ref >60)
Glucose, Bld: 114 mg/dL — ABNORMAL HIGH (ref 70–99)
Potassium: 4 mmol/L (ref 3.5–5.1)
Sodium: 139 mmol/L (ref 135–145)

## 2022-08-30 MED ORDER — AMOXICILLIN-POT CLAVULANATE 875-125 MG PO TABS
1.0000 | ORAL_TABLET | Freq: Two times a day (BID) | ORAL | Status: DC
  Administered 2022-08-30 – 2022-08-31 (×2): 1 via ORAL
  Filled 2022-08-30 (×2): qty 1

## 2022-08-30 MED ORDER — MELATONIN 5 MG PO TABS
10.0000 mg | ORAL_TABLET | Freq: Once | ORAL | Status: AC
  Administered 2022-08-30: 10 mg via ORAL
  Filled 2022-08-30: qty 2

## 2022-08-30 MED ORDER — ONDANSETRON 4 MG PO TBDP
4.0000 mg | ORAL_TABLET | Freq: Three times a day (TID) | ORAL | Status: DC | PRN
  Administered 2022-08-30 – 2022-08-31 (×2): 4 mg via ORAL
  Filled 2022-08-30 (×2): qty 1

## 2022-08-30 MED ORDER — AMOXICILLIN-POT CLAVULANATE 875-125 MG PO TABS: 1.0000 | ORAL_TABLET | Freq: Two times a day (BID) | ORAL | Status: DC

## 2022-08-30 MED ORDER — DIPHENHYDRAMINE HCL 50 MG/ML IJ SOLN: 25.0000 mg | Freq: Once | INTRAMUSCULAR | Status: DC | PRN

## 2022-08-30 NOTE — Plan of Care (Signed)
?  Problem: Elimination: ?Goal: Will not experience complications related to urinary retention ?Outcome: Progressing ?  ?

## 2022-08-30 NOTE — Progress Notes (Signed)
Mobility Specialist - Progress Note   08/30/22 0958  Mobility  Activity Ambulated independently in hallway  Level of Assistance Independent after set-up  Assistive Device None  Distance Ambulated (ft) 750 ft  Range of Motion/Exercises Active  Activity Response Tolerated well  Mobility Referral Yes  $Mobility charge 1 Mobility  Mobility Specialist Start Time (ACUTE ONLY) 0945  Mobility Specialist Stop Time (ACUTE ONLY) 0958  Mobility Specialist Time Calculation (min) (ACUTE ONLY) 13 min   Pt was found in bed and agreeable to ambulate. No complaints with session. At EOS returned to bed with all needs met. Call bell in reach.  Billey Chang Mobility Specialist

## 2022-08-30 NOTE — Progress Notes (Signed)
Patient ID: Danielle Cox, female   DOB: June 16, 1960, 62 y.o.   MRN: [id number] [facility] Surgery Progress Note     Subjective: Minimal discomfort.  Tolerating FLD with no issues.  No nausea or vomiting.  Objective: Vital signs in last 24 hours: Temp:  [98.3 F (36.8 C)-98.9 F (37.2 C)] 98.9 F (37.2 C) (08/15 0538) Pulse Rate:  [66-75] 72 (08/15 0538) Resp:  [19-23] 19 (08/15 0538) BP: (134-139)/(70-76) 134/76 (08/15 0538) SpO2:  [91 %-94 %] 91 % (08/15 0538) Last BM Date : 08/30/22  Intake/Output from previous day: 08/14 0701 - 08/15 0700 In: 1880 [P.O.:1080; IV Piggyback:800] Out: 3 [Urine:1; Stool:2] Intake/Output this shift: Total I/O In: 910 [P.O.:910] Out: -   PE: Gen:  Alert, NAD, pleasant Pulm:  rate and effort normal on room air Abd: Soft, nondistended, not really tender today  Lab Results:  Recent Labs    08/28/22 0436 08/29/22 0416  WBC 12.5* 12.1*  HGB 11.7* 11.4*  HCT 36.1 36.3  PLT 218 298   BMET Recent Labs    08/29/22 0416 08/30/22 0415  NA 141 139  K 3.1* 4.0  CL 105 106  CO2 28 28  GLUCOSE 93 114*  BUN 7 6  CREATININE 0.47 0.55  CALCIUM 7.8* 8.0*   PT/INR No results for input(s): "LABPROT", "INR" in the last 72 hours. CMP     Component Value Date/Time   NA 139 08/30/2022 0415   K 4.0 08/30/2022 0415   CL 106 08/30/2022 0415   CO2 28 08/30/2022 0415   GLUCOSE 114 (H) 08/30/2022 0415   BUN 6 08/30/2022 0415   CREATININE 0.55 08/30/2022 0415   CALCIUM 8.0 (L) 08/30/2022 0415   PROT 5.4 (L) 08/26/2022 2356   ALBUMIN 2.5 (L) 08/26/2022 2356   AST 19 08/26/2022 2356   ALT 24 08/26/2022 2356   ALKPHOS 44 08/26/2022 2356   BILITOT 0.4 08/26/2022 2356   GFRNONAA >60 08/30/2022 0415   GFRAA >60 05/15/2019 1246   Lipase     Component Value Date/Time   LIPASE 26 08/22/2022 1723       Studies/Results: No results found.  Anti-infectives: Anti-infectives (From admission, onward)    Start     Dose/Rate Route  Frequency Ordered Stop   08/27/22 1400  ceFEPIme (MAXIPIME) 2 g in sodium chloride 0.9 % 100 mL IVPB        2 g 200 mL/hr over 30 Minutes Intravenous Every 8 hours 08/27/22 0649     08/26/22 2200  metroNIDAZOLE (FLAGYL) IVPB 500 mg        500 mg 100 mL/hr over 60 Minutes Intravenous 2 times daily 08/26/22 1652 09/05/22 2159   08/26/22 1745  ceFEPIme (MAXIPIME) 2 g in sodium chloride 0.9 % 100 mL IVPB  Status:  Discontinued       Note to Pharmacy: Pharmacy may adjust dosing strength, schedule, rate of infusion, etc as needed to optimize therapy   2 g 200 mL/hr over 30 Minutes Intravenous Every 8 hours 08/26/22 1649 08/27/22 0647   08/24/22 1400  vancomycin (VANCOCIN) capsule 125 mg        125 mg Oral 4 times daily 08/24/22 1235 09/03/22 1359   08/23/22 2000  cefTRIAXone (ROCEPHIN) 2 g in sodium chloride 0.9 % 100 mL IVPB  Status:  Discontinued        2 g 200 mL/hr over 30 Minutes Intravenous Every 24 hours 08/23/22 0542 08/26/22 1649   08/23/22 1436  cefTRIAXone (ROCEPHIN) 2 g  in sodium chloride 0.9 % 100 mL IVPB  Status:  Discontinued        2 g 200 mL/hr over 30 Minutes Intravenous Every 24 hours 08/23/22 1436 08/23/22 1442   08/23/22 1436  metroNIDAZOLE (FLAGYL) IVPB 500 mg  Status:  Discontinued        500 mg 100 mL/hr over 60 Minutes Intravenous Every 12 hours 08/23/22 1436 08/23/22 1441   08/23/22 0800  metroNIDAZOLE (FLAGYL) IVPB 500 mg  Status:  Discontinued        500 mg 100 mL/hr over 60 Minutes Intravenous Every 12 hours 08/23/22 0542 08/26/22 1652   08/22/22 1915  cefTRIAXone (ROCEPHIN) 2 g in sodium chloride 0.9 % 100 mL IVPB        2 g 200 mL/hr over 30 Minutes Intravenous  Once 08/22/22 1900 08/22/22 2032   08/22/22 1915  metroNIDAZOLE (FLAGYL) IVPB 500 mg        500 mg 100 mL/hr over 60 Minutes Intravenous  Once 08/22/22 1900 08/22/22 2123        Assessment/Plan Ischemic colitis or worsening sigmoid diverticulitis with microperforation C diff colitis - repeat CT  8/11 with perforated acute sigmoid diverticulitis with new pericolonic free air tracking along the sigmoid mesenteric veins into the inferior mesenteric vein with worsened inflammatory fat stranding extending throughout the anterior left retroperitoneum and inferiorly into the presacral space; no measurable pericolonic fluid collections; findings raise concern for development of ischemic colitis - Antibiotics switched to cefepime and flagyl 8/11. She is on oral vancomycin for C diff. GI panel also positive for E coli - no further fevers.  WBC stable at 12. - pain seems to be improving and doing well on FLD.  Adv to soft diet - no plans or acute needs for surgical intervention.  Agree with GI that patient should follow up with outpatient primary GI doc.  We are available as needed.  ID - cefepime and flagyl 8/11>>. Oral vancomycin 8/9>> FEN - IVF, FLD VTE - lovenox Foley - none  COPD GERD HLD Anxiety, depression, Bipolar  I reviewed hospitalist notes, last 24 h vitals and pain scores, last 48 h intake and output, and last 24 h labs and trends.    LOS: 8 days    Letha Cape, [facility] Surgery 08/30/2022, 12:24 PM Please see Amion for pager number during day hours 7:00am-4:30pm

## 2022-08-30 NOTE — Progress Notes (Signed)
HOSPITALIST ROUNDING NOTE Danielle Cox ZOX:[id number]  DOB: 1960/04/14  DOA: 08/22/2022  PCP: [name], MD  08/30/2022,1:54 PM   LOS: 8 days      Code Status: Full   From: Home  current Dispo: Likely home     62 year old white female Bipolar COPD colonic polyps H. pylori gastritis on EGD 04/2020 Chronic diarrhea previously on dicyclomine 20 3 times daily previously as well as colestipol-stool studies at [facility] negative for infection pancreatic elastase low Seen by [name] PA [facility] 8/6 nausea vomiting diarrhea 10 bowel movements a day E. coli C. difficile were negative-Rx previously Lomotil Phenergan-GI pathogen panel was ordered patient was restarted on dicyclomine Lomotil and lab work was ordered-she developed fever on 8/7 and had 15 pound weight loss-outpatient C. difficile apparently returned positive subsequently   Brought to emergency room with further diarrhea CT abdomen pelvis showed sigmoid diverticulitis at admission 8/7 Was started on Rocephin Flagyl on admission and blood stool and C. difficile were obtained and patient was also started on oral Vanco 8/11 repeat CT secondary to low-grade fever abdominal pain?  Ischemic versus perforated diverticulitis-General Surgery consulted 8/12 GI was consulted secondary to?  Ischemic colitis-felt no role for sigmoid colonoscopy  Plan  Sepsis on admission secondary to sigmoid diverticulitis --Eubacterium millimole E. coli enterotoxigenic  Initially on empiric-broadened to cefepime and Flagyl 8/--->back down to Augmentin to complete at least 14 days total stop date = 8/20 if no fever chills could go as early as tomorrow C. difficile +8/9--oral Vanco for 2 weeks post treatment with IV antibiotics-stop date around 09/16/2022 Check WBC a.m. but overall looks stabilized Saline lock IV Discontinue dose Zofran IV-placed on ODT 4 mg every 8  History of anxiety depression and bipolar Significant polypharmacy additionally-she follows with a  psychiatrist who is new to her of new guardian and this physician and should see her in the near short-term for follow-up given massive doses of antidepressants etc. Continue at this time Invega 6 mg at bedtime Depakote 500 twice daily Cogentin 0.5 daily trazodone 300 at bedtime Restoril 30 at bedtime Paradoxically needs to be on modafinil 200 a.m/p.m. for alertness  Mild hypokalemia Replaced  Urinary retention Started on Flomax 0.4 on 8/10 May need alternate meds but hesitate to add given polypharmacy with several psych meds which can worsen this    DVT prophylaxis: SCD  Status is: Inpatient Remains inpatient appropriate because:   Requires further improvement may need further imaging    Subjective:  Less emotional today 2 stools overall-was sitting on the toilet when I saw him No fever no chills no nausea no vomiting Asking if she can go home Reminded her she is on IV antibiotics and we need to see her tolerance of diet   Objective + exam Vitals:   08/29/22 1455 08/29/22 2029 08/30/22 0538 08/30/22 1306  BP: 139/70 139/76 134/76 130/73  Pulse: 75 66 72 75  Resp: (!) 23 20 19  (!) 21  Temp: 98.6 F (37 C) 98.3 F (36.8 C) 98.9 F (37.2 C) 98.4 F (36.9 C)  TempSrc: Oral Oral Oral Oral  SpO2: 92% 94% 91% 92%  Weight:      Height:       Filed Weights   08/23/22 1737  Weight: 83.3 kg    Examination:  EOMI, affect flat, edentulous Chest clear no wheeze rales rhonchi Abdomen soft no rebound no guarding S1-S2 no murmur no rub no gallop Neuro intact moving 4 limbs equally  Data Reviewed: reviewed  CBC    Component Value Date/Time   WBC 12.1 (H) 08/29/2022 0416   RBC 3.86 (L) 08/29/2022 0416   HGB 11.4 (L) 08/29/2022 0416   HCT 36.3 08/29/2022 0416   PLT 298 08/29/2022 0416   MCV 94.0 08/29/2022 0416   MCH 29.5 08/29/2022 0416   MCHC 31.4 08/29/2022 0416   RDW 14.9 08/29/2022 0416   LYMPHSABS 2.8 08/06/2022 1517   MONOABS 1.0 08/06/2022 1517   EOSABS  0.2 08/06/2022 1517   BASOSABS 0.1 08/06/2022 1517      Latest Ref Rng & Units 08/30/2022    4:15 AM 08/29/2022    4:16 AM 08/28/2022    4:36 AM  CMP  Glucose 70 - 99 mg/dL 981  93  191   BUN 6 - 20 mg/dL 6  7  9    Creatinine 0.44 - 1.00 mg/dL 4.78  2.95  6.21   Sodium 135 - 145 mmol/L 139  141  144   Potassium 3.5 - 5.1 mmol/L 4.0  3.1  3.6   Chloride 98 - 111 mmol/L 106  105  107   CO2 22 - 32 mmol/L 28  28  29    Calcium 8.9 - 10.3 mg/dL 8.0  7.8  8.3      Scheduled Meds:  acetaminophen  1,000 mg Oral Q6H   amoxicillin-clavulanate  1 tablet Oral Q12H   atorvastatin  20 mg Oral Daily   benztropine  0.5 mg Oral Daily   divalproex  500 mg Oral BID   feeding supplement  1 Container Oral BID BM   modafinil  200 mg Oral BID WC   nicotine  21 mg Transdermal Daily   nystatin  5 mL Oral QID   paliperidone  6 mg Oral Daily   pantoprazole  40 mg Oral Daily   potassium chloride  40 mEq Oral BID   sodium chloride flush  3 mL Intravenous Q12H   tamsulosin  0.4 mg Oral Daily   traZODone  300 mg Oral QHS   vancomycin  125 mg Oral QID   Continuous Infusions:  sodium chloride Stopped (08/26/22 1735)   methocarbamol (ROBAXIN) IV     ondansetron (ZOFRAN) IV      Time  30  Rhetta Mura, MD  [facility]

## 2022-08-30 NOTE — Progress Notes (Signed)
Mobility Specialist - Progress Note   08/30/22 1417  Mobility  Activity Ambulated independently in hallway  Level of Assistance Independent after set-up  Assistive Device None  Distance Ambulated (ft) 750 ft  Range of Motion/Exercises Active  Activity Response Tolerated well  Mobility Referral Yes  $Mobility charge 1 Mobility   Pt was found in bed and agreeable to ambulate. No complaints with session. Stated wanting her husband to be called and made aware of her plan of care. RN notified. At EOS returned to bed with all needs met. Call bell in reach.  Billey Chang Mobility Specialist

## 2022-08-31 ENCOUNTER — Other Ambulatory Visit (HOSPITAL_COMMUNITY): Payer: Self-pay

## 2022-08-31 ENCOUNTER — Telehealth (HOSPITAL_COMMUNITY): Payer: Self-pay

## 2022-08-31 DIAGNOSIS — K5720 Diverticulitis of large intestine with perforation and abscess without bleeding: Secondary | ICD-10-CM | POA: Diagnosis not present

## 2022-08-31 MED ORDER — NICOTINE 21 MG/24HR TD PT24
21.0000 mg | MEDICATED_PATCH | Freq: Every day | TRANSDERMAL | 0 refills | Status: DC
  Filled 2022-08-31: qty 28, 28d supply, fill #0

## 2022-08-31 MED ORDER — POTASSIUM CHLORIDE CRYS ER 20 MEQ PO TBCR
40.0000 meq | EXTENDED_RELEASE_TABLET | Freq: Two times a day (BID) | ORAL | 0 refills | Status: DC
  Filled 2022-08-31: qty 20, 5d supply, fill #0

## 2022-08-31 MED ORDER — FAMOTIDINE 40 MG PO TABS
40.0000 mg | ORAL_TABLET | Freq: Every evening | ORAL | 1 refills | Status: DC
  Filled 2022-08-31: qty 30, 30d supply, fill #0

## 2022-08-31 MED ORDER — VANCOMYCIN HCL 125 MG PO CAPS
125.0000 mg | ORAL_CAPSULE | Freq: Four times a day (QID) | ORAL | 0 refills | Status: AC
  Filled 2022-08-31: qty 28, 7d supply, fill #0

## 2022-08-31 MED ORDER — VANCOMYCIN HCL 125 MG PO CAPS
125.0000 mg | ORAL_CAPSULE | Freq: Four times a day (QID) | ORAL | 0 refills | Status: DC
  Filled 2022-08-31: qty 64, 16d supply, fill #0

## 2022-08-31 MED ORDER — ONDANSETRON 4 MG PO TBDP
4.0000 mg | ORAL_TABLET | Freq: Three times a day (TID) | ORAL | 0 refills | Status: DC | PRN
  Filled 2022-08-31: qty 20, 7d supply, fill #0

## 2022-08-31 MED ORDER — AMOXICILLIN-POT CLAVULANATE 875-125 MG PO TABS
1.0000 | ORAL_TABLET | Freq: Two times a day (BID) | ORAL | 0 refills | Status: AC
Start: 2022-08-31 — End: 2022-09-04
  Filled 2022-08-31: qty 8, 4d supply, fill #0

## 2022-08-31 NOTE — Care Management Important Message (Signed)
Important Message  Patient Details IM Letter given. Name: KAIDEN LICEA MRN: [id number] Date of Birth: [birth year]   Medicare Important Message Given:  Yes     Caren Macadam 08/31/2022, 2:21 PM

## 2022-08-31 NOTE — Telephone Encounter (Signed)
Pharmacy Patient Advocate Encounter  Received notification from [facility] that Prior Authorization for Vancomycin HCl 125mg  capsules has been APPROVED from 01/15/2022 to 01/15/2023. Ran test claim, Copay is $10.00. This test claim was processed through [facility]- copay amounts may vary at other pharmacies due to pharmacy/plan contracts, or as the patient moves through the different stages of their insurance plan.   PA #/Case ID/Reference #: [id number] KEY: [id number]

## 2022-08-31 NOTE — Progress Notes (Signed)
Patient anxious overnight, Hospitalist messaged about anxiety, but no new meds started because of suspected polypharmacy. Hospitalist thinks patient should have psych eval while being treated.

## 2022-08-31 NOTE — Discharge Summary (Addendum)
Physician Discharge Summary  Danielle Cox:[id number] DOB: 1960/05/10 DOA: 08/22/2022  PCP: Danielle Dark, MD  Admit date: 08/22/2022 Discharge date: 08/31/2022  Time spent: 35 minutes  Recommendations for Outpatient Follow-up:  Finish Augmentin, then Vancomycin as per orders Cbc+bmet 1 week Patient aware to follow-up with psychiatry to de-escalate off of multiple psychotropic meds-significant polypharmacy noted with risk to the patient going forward if she continues Continue smoking cessation efforts in the outpatient setting with PCP etc.  Discharge Diagnoses:  MAIN problem for hospitalization   Colitis infectious bacterial Superimposed C. difficile colitis? Severe anxiety bipolar depression on multiple meds and mood stabilizers Paradoxical hypersomnia on modafinil Hypokalemia Some urinary retention which resolved after hospital stay  Please see below for itemized issues addressed in HOpsital- refer to other progress notes for clarity if needed  Discharge Condition: Improved/  Diet recommendation: Regular  Filed Weights   08/23/22 1737  Weight: 83.3 kg    History of present illness:  62 year old white female Bipolar COPD colonic polyps H. pylori gastritis on EGD 04/2020 Chronic diarrhea previously on dicyclomine 20 3 times daily previously as well as colestipol-stool studies at [facility] negative for infection pancreatic elastase low Seen by [name] PA [facility] 8/6 nausea vomiting diarrhea 10 bowel movements a day E. coli C. difficile were negative-Rx previously Lomotil Phenergan-GI pathogen panel was ordered patient was restarted on dicyclomine Lomotil and lab work was ordered-she developed fever on 8/7 and had 15 pound weight loss-outpatient C. difficile apparently returned positive subsequently     Brought to emergency room with further diarrhea CT abdomen pelvis showed sigmoid diverticulitis at admission 8/7 Was started on Rocephin Flagyl on admission and blood  stool and C. difficile were obtained and patient was also started on oral Vanco 8/11 repeat CT secondary to low-grade fever abdominal pain?  Ischemic versus perforated diverticulitis-General Surgery consulted 8/12 GI was consulted secondary to?  Ischemic colitis-felt no role for sigmoid colonoscopy  Hospital Course:  Sepsis on admission secondary to sigmoid diverticulitis --Eubacterium millimole E. coli enterotoxigenic  Initially on empiric-broadened to cefepime and Flagyl 8/--->back down to Augmentin to complete at least 14 days total stop date = 8/20 I--- improved readily during hospital stay over the final 2 days was tolerating diet was able to eat without much nausea and did not have diarrhea like stools Patient was counseled with regards to completion of antibiotics-see below regarding C. difficile C. difficile +8/9--oral Vanco for 2 weeks post treatment with IV antibiotics-stop date around 09/16/2022 SHOULD NOT USE PPI-instead famotidine has been prescribed at discharge May benefit from low-dose Zofran Note that she has what looks like "functional" GI issues with chronic diarrhea followed in the outpatient setting by Mr. [name] at [facility] so I will CC him with regards to this for close follow-up   History of anxiety depression and bipolar polypharmacy additionally-she follows with a psychiatrist who is new to her on new guardian and this physician and should see her in the near short-term for follow-up given massive doses of antidepressants etc. I continued her meds without change given she has been on this regimen for what sounds like a long time but have recommended strongly to her to consider de-escalation of the same We have stopped the provigil as she wasn't taking at home and as per husband request   Mild hypokalemia Replaced-giving prescription for replacement of K. Dur and will need Chem-12/Chem-7 in about a week   Urinary retention Does not work in [employer] have  discontinued  this off of her MAR-I suspect she has urinary retention probably from anticholinergic/antihistamine effects of multiple psychotropics She should be de-escalated off of these I hesitate to add nightly trach or other medications for incontinence/urge incontinence at this time-she should follow-up with PCP once psychiatry has seen her    Discharge Exam: Vitals:   08/30/22 2054 08/31/22 0427  BP: (!) 145/73 112/89  Pulse: 69 78  Resp: (!) 22 20  Temp: 98.4 F (36.9 C) 98.2 F (36.8 C)  SpO2: 96% 93%    Subj on day of d/c   Awake coherent pleasant looks better than she did yesterday not emotional Some nausea this morning, no significant stool since yesterday Eating  General Exam on discharge  Flat affect pleasant coherent no distress EOMI NCAT moderate dentition Chest clear no added sound rales rhonchi S1-S2 no murmur History -Not on telemetry No lower extremity edema Neuro intact  Discharge Instructions   Discharge Instructions     Diet - low sodium heart healthy   Complete by: As directed    Discharge instructions   Complete by: As directed    Finish all of the antibiotics Augmentin for your diverticulitis Remember that you will need to also continue the vancomycin solution as well as per the written chart record because you have 2 things going on in your diet, not just a plain oral infection but also C. difficile-this is much better according to our estimation today as you are not having any severe stools Your nausea will probably continue but it would get better if you take your omeprazole occasionally-because of you having C. difficile however a proton pump inhibitor such as omeprazole or pantoprazole is not a good idea so I will substitute that with Pepcid I do think as prior discussions that you require a close follow-up visit with your psychiatrist-you are on way too many meds and although you are only 62, as we discussed these meds can change your brain  structure and function and it would be beneficial to you come off down off of some of them or go to a lower dose of multiple 160s Please follow-up in the outpatient setting with your primary care physician who will get labs in about a week If you have high fever chills nausea vomiting chest pain severe diarrhea >5 stools a day please return to the emergency room   Increase activity slowly   Complete by: As directed       Allergies as of 08/31/2022       Reactions   Moxifloxacin Hcl Palpitations   REACTION: tackycardia   Corticosteroids Other (See Comments)   Mania        Medication List     STOP taking these medications    acetaminophen 500 MG tablet Commonly known as: TYLENOL   dicyclomine 20 MG tablet Commonly known as: BENTYL   diphenoxylate-atropine 2.5-0.025 MG tablet Commonly known as: Lomotil   modafinil 200 MG tablet Commonly known as: PROVIGIL   nystatin 100000 UNIT/ML suspension Commonly known as: MYCOSTATIN   omeprazole 20 MG capsule Commonly known as: PRILOSEC   promethazine 12.5 MG tablet Commonly known as: PHENERGAN       TAKE these medications    amoxicillin-clavulanate 875-125 MG tablet Commonly known as: AUGMENTIN Take 1 tablet by mouth every 12 (twelve) hours for 4 days.   atorvastatin 20 MG tablet Commonly known as: LIPITOR TAKE 1 TABLET BY MOUTH DAILY   benztropine 0.5 MG tablet Commonly known as: COGENTIN Take 0.5 mg  by mouth daily.   diclofenac 75 MG EC tablet Commonly known as: VOLTAREN Take 1 tablet (75 mg total) by mouth 2 (two) times daily.   divalproex 500 MG DR tablet Commonly known as: DEPAKOTE Take 500 mg by mouth 2 (two) times daily.   famotidine 40 MG tablet Commonly known as: PEPCID Take 1 tablet (40 mg total) by mouth every evening.   nicotine 21 mg/24hr patch Commonly known as: NICODERM CQ - dosed in mg/24 hours Place 1 patch (21 mg total) onto the skin daily.   ondansetron 4 MG disintegrating  tablet Commonly known as: ZOFRAN-ODT Take 1 tablet (4 mg total) by mouth every 8 (eight) hours as needed for nausea or vomiting.   paliperidone 6 MG 24 hr tablet Commonly known as: INVEGA Take 6 mg by mouth at bedtime.   potassium chloride SA 20 MEQ tablet Commonly known as: KLOR-CON M Take 2 tablets (40 mEq total) by mouth 2 (two) times daily.   QUEtiapine 100 MG tablet Commonly known as: SEROQUEL Take 100 mg by mouth 2 (two) times daily.   temazepam 15 MG capsule Commonly known as: RESTORIL Take 30 mg by mouth at bedtime as needed for sleep.   traZODone 150 MG tablet Commonly known as: DESYREL Take 300 mg by mouth at bedtime.   vancomycin 125 MG capsule Commonly known as: VANCOCIN Take 1 capsule (125 mg total) by mouth 4 (four) times daily for 7 days.       Allergies  Allergen Reactions   Moxifloxacin Hcl Palpitations    REACTION: tackycardia   Penicillins Rash    Has patient had a PCN reaction causing immediate rash, facial/tongue/throat swelling, SOB or lightheadedness with hypotension: Yes Has patient had a PCN reaction causing severe rash involving mucus membranes or skin necrosis: No Has patient had a PCN reaction that required hospitalization: No Has patient had a PCN reaction occurring within the last 10 years: No If all of the above answers are "NO", then may proceed with Cephalosporin use.    Corticosteroids Other (See Comments)    Mania      The results of significant diagnostics from this hospitalization (including imaging, microbiology, ancillary and laboratory) are listed below for reference.    Significant Diagnostic Studies: CT ABDOMEN PELVIS W CONTRAST  Result Date: 08/26/2022 CLINICAL DATA:  Inpatient. Abdominal pain, acute (Ped 0-17y). Recent diagnosis of sigmoid diverticulitis. EXAM: CT ABDOMEN AND PELVIS WITH CONTRAST TECHNIQUE: Multidetector CT imaging of the abdomen and pelvis was performed using the standard protocol following bolus  administration of intravenous contrast. RADIATION DOSE REDUCTION: This exam was performed according to the departmental dose-optimization program which includes automated exposure control, adjustment of the mA and/or kV according to patient size and/or use of iterative reconstruction technique. CONTRAST:  OMNIPAQUE IOHEXOL 300 MG/ML  SOLN COMPARISON:  08/25/2022 abdominal radiograph. 08/22/2022 CT abdomen/pelvis. FINDINGS: Lower chest: Trace posterior bilateral pleural effusions, right greater than left, increased bilaterally. Similar trace pericardial effusion. Mild posterior bibasilar atelectasis. Healed lateral right eighth rib fracture. Hepatobiliary: Normal liver size. Simple 3.3 cm inferior left liver cyst with numerous additional subcentimeter hypodense liver lesions scattered throughout the liver that are too small to characterize and unchanged. No appreciable new liver lesions. Normal gallbladder with no radiopaque cholelithiasis. No biliary ductal dilatation. Pancreas: Normal, with no mass or duct dilation. Spleen: Normal size. No mass. Adrenals/Urinary Tract: Normal adrenals. No hydronephrosis. Simple 2.1 cm posterior lower left renal cyst with additional scattered subcentimeter hypodense left renal cortical lesions that are  too small to characterize and are unchanged, for which no follow-up imaging is recommended. Normal bladder. Stomach/Bowel: Normal non-distended stomach. Normal caliber small bowel with no small bowel wall thickening. Oral contrast transits to the left colon. Right upper quadrant cecum with normal appendix. Marked sigmoid diverticulosis with wall thickening throughout the mid sigmoid colon with prominent pericolonic fat stranding and new pericolonic free air throughout the sigmoid mesentery, which appears to track along the sigmoid mesenteric veins into the inferior mesenteric vein on the coronal images (series 5/image 62). Inflammatory fat stranding extends throughout the  anterior left paranephric retroperitoneum and inferiorly into the presacral space. No measurable pericolonic fluid collections. Vascular/Lymphatic: Atherosclerotic nonaneurysmal abdominal aorta. Patent portal, splenic, hepatic and renal veins. Mildly enlarged 1.4 cm porta hepatis node (series 3/image 34), stable. Newly mildly enlarged 1.7 cm portacaval node (series 3/image 37). Reproductive: Grossly normal uterus.  No adnexal mass. Other: No focal fluid collection. Musculoskeletal: No aggressive appearing focal osseous lesions. Moderate degenerative disc disease at L5-S1. IMPRESSION: 1. Unusual perforated acute sigmoid diverticulitis with new pericolonic free air tracking along the sigmoid mesenteric veins into the inferior mesenteric vein with worsened inflammatory fat stranding extending throughout the anterior left retroperitoneum and inferiorly into the presacral space. No measurable pericolonic fluid collections. Findings raise concern for development of ischemic colitis. 2. Trace posterior bilateral pleural effusions, right greater than left, increased bilaterally. Similar trace pericardial effusion. 3. Nonspecific mild porta hepatis and portacaval lymphadenopathy, mildly increased. 4.  Aortic Atherosclerosis (ICD10-I70.0). Critical Value/emergent results were called by telephone at the time of interpretation on 08/26/2022 at 4:40 pm to provider [facility] , who verbally acknowledged these results. Electronically Signed   By: Delbert Phenix M.D.   On: 08/26/2022 16:45   DG Abd 1 View  Result Date: 08/25/2022 CLINICAL DATA:  Lower abdominal pain EXAM: ABDOMEN - 1 VIEW COMPARISON:  None Available. FINDINGS: No dilated large or small bowel. Small amount gas and stool in the rectum. No pathologic calcifications. No organomegaly. No acute osseous abnormality. IMPRESSION: 1. No acute findings. 2. Small amount of gas and stool in the rectum. Electronically Signed   By: Genevive Bi M.D.   On: 08/25/2022  16:58   CT ABDOMEN PELVIS W CONTRAST  Result Date: 08/22/2022 CLINICAL DATA:  Abdominal pain. EXAM: CT ABDOMEN AND PELVIS WITH CONTRAST TECHNIQUE: Multidetector CT imaging of the abdomen and pelvis was performed using the standard protocol following bolus administration of intravenous contrast. RADIATION DOSE REDUCTION: This exam was performed according to the departmental dose-optimization program which includes automated exposure control, adjustment of the mA and/or kV according to patient size and/or use of iterative reconstruction technique. CONTRAST:  OMNIPAQUE IOHEXOL 300 MG/ML  SOLN COMPARISON:  None Available. FINDINGS: Lower chest: Lung bases clear.  No pericardial or pleural effusion. Hepatobiliary: Numerous bilobar hepatic cysts, largest in the lateral left lobe measuring 2.5 cm. Biliary ductal dilatation. Unremarkable gallbladder. Pancreas: Unremarkable. No pancreatic ductal dilatation or surrounding inflammatory changes. Spleen: Normal in size without focal abnormality. Adrenals/Urinary Tract: Left kidney cysts identified largest 2.5 cm. Intrarenal stones in the left measuring about 7 mm. No hydronephrosis. Adrenal glands unremarkable. Urinary bladder unremarkable. Stomach/Bowel: Stomach is within normal limits. Appendix appears normal. Diverticulosis identified descending and sigmoid colon. There is Peri diverticular fat stranding in the sigmoid mesentery consistent with diverticulitis. No abscess or obstruction. Vascular/Lymphatic: Aortic atherosclerosis. No enlarged abdominal or pelvic lymph nodes. Reproductive: Uterus and bilateral adnexa are unremarkable. Other: No abdominal wall hernia or abnormality. No abdominopelvic ascites. Musculoskeletal:  Lumbosacral degenerative changes. No acute osseous abnormalities. IMPRESSION: 1. Sigmoid diverticulitis.  No abscess or obstruction. 2. Numerous cysts in the liver as well as left kidney. 3. Nonobstructing left renal stones. Electronically Signed    By: Layla Maw M.D.   On: 08/22/2022 20:20   DG Chest Portable 1 View  Result Date: 08/22/2022 CLINICAL DATA:  Sepsis. EXAM: PORTABLE CHEST 1 VIEW COMPARISON:  Chest radiograph dated 08/22/2018. FINDINGS: No focal consolidation, pleural effusion, or pneumothorax. Stable cardiac silhouette. Atherosclerotic calcification of the aortic arch. Scoliosis. No acute osseous pathology. IMPRESSION: No active disease. Electronically Signed   By: Elgie Collard M.D.   On: 08/22/2022 19:45    Microbiology: Recent Results (from the past 240 hour(s))  Blood culture (routine x 2)     Status: Abnormal   Collection Time: 08/22/22  6:29 PM   Specimen: BLOOD  Result Value Ref Range Status   Specimen Description   Final    BLOOD RIGHT ANTECUBITAL Performed at [facility], [address] [place], [place] [address]    Special Requests   Final    BOTTLES DRAWN AEROBIC AND ANAEROBIC Blood Culture adequate volume Performed at [facility], [address], [place], [place] [address]    Culture  Setup Time   Final    GRAM POSITIVE RODS ANAEROBIC BOTTLE ONLY CRITICAL RESULT CALLED TO, READ BACK BY AND VERIFIED WITH: PHARMD AMY GREGORY [id number] AT 1948 BY EC GRAM NEGATIVE RODS AEROBIC BOTTLE ONLY CRITICAL RESULT CALLED TO, READ BACK BY AND VERIFIED WITH: Tereso Newcomer, AT 7253 08/28/22 Renato Shin Performed at [facility], [address] [place], [place] [address]    Culture (A)  Final    EUBACTERIUM LIMOSUM Standardized susceptibility testing for this organism is not available. ESCHERICHIA COLI    Report Status 08/29/2022 FINAL  Final   Organism ID, Bacteria ESCHERICHIA COLI  Final      Susceptibility   Escherichia coli - MIC*    AMPICILLIN 8 SENSITIVE Sensitive     CEFEPIME <=0.12 SENSITIVE Sensitive     CEFTAZIDIME <=1 SENSITIVE Sensitive     CEFTRIAXONE <=0.25 SENSITIVE Sensitive     CIPROFLOXACIN <=0.25 SENSITIVE Sensitive     GENTAMICIN  <=1 SENSITIVE Sensitive     IMIPENEM <=0.25 SENSITIVE Sensitive     TRIMETH/SULFA <=20 SENSITIVE Sensitive     AMPICILLIN/SULBACTAM 4 SENSITIVE Sensitive     PIP/TAZO <=4 SENSITIVE Sensitive     * ESCHERICHIA COLI  Blood culture (routine x 2)     Status: Abnormal   Collection Time: 08/22/22  6:30 PM   Specimen: BLOOD  Result Value Ref Range Status   Specimen Description   Final    BLOOD LEFT ANTECUBITAL Performed at [facility], [address] [place], [place] [address]    Special Requests   Final    BOTTLES DRAWN AEROBIC AND ANAEROBIC Blood Culture results may not be optimal due to an excessive volume of blood received in culture bottles Performed at [facility], [address], [place], [place] [address]    Culture  Setup Time   Final    GRAM POSITIVE RODS ANAEROBIC BOTTLE ONLY CRITICAL VALUE NOTED.  VALUE IS CONSISTENT WITH PREVIOUSLY REPORTED AND CALLED VALUE. Performed at [facility], [address] [place], [place] [address]    Culture EUBACTERIUM LIMOSUM (A)  Final   Report Status 08/28/2022 FINAL  Final  SARS Coronavirus 2 by RT PCR (hospital  order, performed in [facility] hospital lab) *cepheid single result test* Anterior Nasal Swab     Status: None   Collection Time: 08/22/22  7:21 PM   Specimen: Anterior Nasal Swab  Result Value Ref Range Status   SARS Coronavirus 2 by RT PCR NEGATIVE NEGATIVE Final    Comment: (NOTE) SARS-CoV-2 target nucleic acids are NOT DETECTED.  The SARS-CoV-2 RNA is generally detectable in upper and lower respiratory specimens during the acute phase of infection. The lowest concentration of SARS-CoV-2 viral copies this assay can detect is 250 copies / mL. A negative result does not preclude SARS-CoV-2 infection and should not be used as the sole basis for treatment or other patient management decisions.  A negative result may occur with improper specimen collection / handling, submission of  specimen other than nasopharyngeal swab, presence of viral mutation(s) within the areas targeted by this assay, and inadequate number of viral copies (<250 copies / mL). A negative result must be combined with clinical observations, patient history, and epidemiological information.  Fact Sheet for Patients:   RoadLapTop.co.za  Fact Sheet for Healthcare Providers: http://kim-miller.com/  This test is not yet approved or  cleared by the [place] FDA and has been authorized for detection and/or diagnosis of SARS-CoV-2 by FDA under an Emergency Use Authorization (EUA).  This EUA will remain in effect (meaning this test can be used) for the duration of the COVID-19 declaration under Section 564(b)(1) of the Act, 21 U.S.C. section 360bbb-3(b)(1), unless the authorization is terminated or revoked sooner.  Performed at [facility], [address] [place], [place] [address]   Gastrointestinal Panel by PCR , Stool     Status: Abnormal   Collection Time: 08/23/22 12:24 PM   Specimen: Stool  Result Value Ref Range Status   Campylobacter species NOT DETECTED NOT DETECTED Final   Plesimonas shigelloides NOT DETECTED NOT DETECTED Final   Salmonella species NOT DETECTED NOT DETECTED Final   Yersinia enterocolitica NOT DETECTED NOT DETECTED Final   Vibrio species NOT DETECTED NOT DETECTED Final   Vibrio cholerae NOT DETECTED NOT DETECTED Final   Enteroaggregative E coli (EAEC) NOT DETECTED NOT DETECTED Final   Enteropathogenic E coli (EPEC) DETECTED (A) NOT DETECTED Final    Comment: RESULT CALLED TO, READ BACK BY AND VERIFIED WITH: JAKE RIMANDO @0621  08/24/22 MJU    Enterotoxigenic E coli (ETEC) DETECTED (A) NOT DETECTED Final    Comment: RESULT CALLED TO, READ BACK BY AND VERIFIED WITH: JAKE RIMANDO @0621  08/24/22 MJU    Shiga like toxin producing E coli (STEC) NOT DETECTED NOT DETECTED Final   Shigella/Enteroinvasive E  coli (EIEC) NOT DETECTED NOT DETECTED Final   Cryptosporidium NOT DETECTED NOT DETECTED Final   Cyclospora cayetanensis NOT DETECTED NOT DETECTED Final   Entamoeba histolytica NOT DETECTED NOT DETECTED Final   Giardia lamblia NOT DETECTED NOT DETECTED Final   Adenovirus F40/41 NOT DETECTED NOT DETECTED Final   Astrovirus NOT DETECTED NOT DETECTED Final   Norovirus GI/GII NOT DETECTED NOT DETECTED Final   Rotavirus A NOT DETECTED NOT DETECTED Final   Sapovirus (I, II, IV, and V) NOT DETECTED NOT DETECTED Final    Comment: Performed at [facility], [address] [place], [place] [address]     Labs: Basic Metabolic Panel: Recent Labs  Lab 08/26/22 2356 08/27/22 0418 08/27/22 0830 08/28/22 0436 08/29/22 0416 08/30/22 0415  NA 140  --  141 144 141 139  K 3.2*  --  3.4* 3.6  3.1* 4.0  CL 105  --  103 107 105 106  CO2 26  --  28 29 28 28   GLUCOSE 111*  --  109* 123* 93 114*  BUN 7  --  7 9 7 6   CREATININE 0.51  --  0.52 0.48 0.47 0.55  CALCIUM 8.0*  --  8.1* 8.3* 7.8* 8.0*  MG  --  2.2  --   --  2.3  --    Liver Function Tests: Recent Labs  Lab 08/26/22 2356  AST 19  ALT 24  ALKPHOS 44  BILITOT 0.4  PROT 5.4*  ALBUMIN 2.5*   No results for input(s): "LIPASE", "AMYLASE" in the last 168 hours. No results for input(s): "AMMONIA" in the last 168 hours. CBC: Recent Labs  Lab 08/25/22 0926 08/26/22 0812 08/27/22 0418 08/28/22 0436 08/29/22 0416  WBC 8.2 9.1 9.4 12.5* 12.1*  HGB 11.9* 12.7 12.7 11.7* 11.4*  HCT 36.7 38.9 39.6 36.1 36.3  MCV 92.7 92.2 93.6 94.5 94.0  PLT 144* 167 178 218 298   Cardiac Enzymes: No results for input(s): "CKTOTAL", "CKMB", "CKMBINDEX", "TROPONINI" in the last 168 hours. BNP: BNP (last 3 results) No results for input(s): "BNP" in the last 8760 hours.  ProBNP (last 3 results) No results for input(s): "PROBNP" in the last 8760 hours.  CBG: No results for input(s): "GLUCAP" in the last 168 hours.   Signed:  Rhetta Mura MD   [facility] 08/31/2022, 10:04 AM

## 2022-08-31 NOTE — Plan of Care (Signed)
  Problem: Fluid Volume: Goal: Hemodynamic stability will improve Outcome: Progressing   Problem: Clinical Measurements: Goal: Diagnostic test results will improve Outcome: Progressing Goal: Signs and symptoms of infection will decrease Outcome: Progressing   Problem: Respiratory: Goal: Ability to maintain adequate ventilation will improve Outcome: Progressing   Problem: Education: Goal: Knowledge of General Education information will improve Description: Including pain rating scale, medication(s)/side effects and non-pharmacologic comfort measures Outcome: Progressing   Problem: Health Behavior/Discharge Planning: Goal: Ability to manage health-related needs will improve Outcome: Progressing   Problem: Clinical Measurements: Goal: Ability to maintain clinical measurements within normal limits will improve Outcome: Progressing Goal: Will remain free from infection Outcome: Progressing Goal: Diagnostic test results will improve Outcome: Progressing Goal: Respiratory complications will improve Outcome: Progressing Goal: Cardiovascular complication will be avoided Outcome: Progressing   Problem: Activity: Goal: Risk for activity intolerance will decrease Outcome: Progressing   Problem: Nutrition: Goal: Adequate nutrition will be maintained Outcome: Progressing   Problem: Elimination: Goal: Will not experience complications related to urinary retention Outcome: Progressing   Problem: Pain Managment: Goal: General experience of comfort will improve Outcome: Progressing   Problem: Safety: Goal: Ability to remain free from injury will improve Outcome: Progressing   Problem: Skin Integrity: Goal: Risk for impaired skin integrity will decrease Outcome: Progressing   Problem: Coping: Goal: Level of anxiety will decrease Outcome: Not Progressing   Problem: Elimination: Goal: Will not experience complications related to bowel motility Outcome: Not Progressing

## 2022-09-03 ENCOUNTER — Telehealth: Payer: Self-pay

## 2022-09-03 NOTE — Transitions of Care (Post Inpatient/ED Visit) (Signed)
   09/03/2022  Name: GERTHA HUTMACHER MRN: [id number] DOB: 14-Nov-1960  Today's TOC FU Call Status: Today's TOC FU Call Status:: Unsuccessful Call (1st Attempt) Unsuccessful Call (1st Attempt) Date: 09/03/22  Attempted to reach the patient regarding the most recent Inpatient/ED visit.  Follow Up Plan: Additional outreach attempts will be made to reach the patient to complete the Transitions of Care (Post Inpatient/ED visit) call.      Antionette Fairy, RN,BSN,CCM [facility] Care Management Care Management Community Coordinator Direct Phone: [phone] Toll Free: [phone] Fax: [phone]

## 2022-09-04 ENCOUNTER — Telehealth: Payer: Self-pay

## 2022-09-04 NOTE — Transitions of Care (Post Inpatient/ED Visit) (Signed)
   09/04/2022  Name: Danielle Cox MRN: [id number] DOB: 22-Jun-1960  Today's TOC FU Call Status: Today's TOC FU Call Status:: Unsuccessful Call (2nd Attempt) Unsuccessful Call (2nd Attempt) Date: 09/04/22  Attempted to reach the patient regarding the most recent Inpatient/ED visit.  Follow Up Plan: Additional outreach attempts will be made to reach the patient to complete the Transitions of Care (Post Inpatient/ED visit) call.     Antionette Fairy, RN,BSN,CCM [facility] Care Management Care Management Community Coordinator Direct Phone: [phone] Toll Free: [phone] Fax: [phone]

## 2022-09-04 NOTE — Transitions of Care (Post Inpatient/ED Visit) (Signed)
   09/04/2022  Name: KWYNN MCCULLICK MRN: [id number] DOB: 02/08/1960  Today's TOC FU Call Status: Today's TOC FU Call Status:: Unsuccessful Call (3rd Attempt) Unsuccessful Call (3rd Attempt) Date: 09/04/22  Attempted to reach the patient regarding the most recent Inpatient/ED visit.  Follow Up Plan: No further outreach attempts will be made at this time. We have been unable to contact the patient.     Antionette Fairy, RN,BSN,CCM [facility] Care Management Care Management Community Coordinator Direct Phone: [phone] Toll Free: [phone] Fax: [phone]

## 2022-09-04 NOTE — Telephone Encounter (Signed)
Patient was seen at the ED on 8/7, vm was left on 8/12 informing her to schedule a ED follow-up visit with her PCP.

## 2022-09-04 NOTE — Telephone Encounter (Signed)
There are no office visits scheduled currently.

## 2022-09-07 ENCOUNTER — Other Ambulatory Visit: Payer: Self-pay

## 2022-09-07 ENCOUNTER — Encounter (HOSPITAL_COMMUNITY): Payer: Self-pay

## 2022-09-07 ENCOUNTER — Emergency Department (HOSPITAL_COMMUNITY)
Admission: EM | Admit: 2022-09-07 | Discharge: 2022-09-09 | Disposition: A | Discharge status: Other Acute Inpatient Hospital | Payer: Medicare PPO | Attending: Emergency Medicine | Admitting: Emergency Medicine

## 2022-09-07 DIAGNOSIS — J449 Chronic obstructive pulmonary disease, unspecified: Secondary | ICD-10-CM | POA: Diagnosis not present

## 2022-09-07 DIAGNOSIS — F419 Anxiety disorder, unspecified: Secondary | ICD-10-CM | POA: Diagnosis present

## 2022-09-07 DIAGNOSIS — F17210 Nicotine dependence, cigarettes, uncomplicated: Secondary | ICD-10-CM | POA: Insufficient documentation

## 2022-09-07 DIAGNOSIS — Z79899 Other long term (current) drug therapy: Secondary | ICD-10-CM | POA: Insufficient documentation

## 2022-09-07 DIAGNOSIS — R000 Tachycardia, unspecified: Secondary | ICD-10-CM | POA: Diagnosis not present

## 2022-09-07 DIAGNOSIS — F3160 Bipolar disorder, current episode mixed, unspecified: Secondary | ICD-10-CM | POA: Diagnosis not present

## 2022-09-07 DIAGNOSIS — F29 Unspecified psychosis not due to a substance or known physiological condition: Secondary | ICD-10-CM | POA: Diagnosis not present

## 2022-09-07 DIAGNOSIS — F3162 Bipolar disorder, current episode mixed, moderate: Secondary | ICD-10-CM | POA: Diagnosis not present

## 2022-09-07 DIAGNOSIS — I10 Essential (primary) hypertension: Secondary | ICD-10-CM | POA: Insufficient documentation

## 2022-09-07 LAB — BASIC METABOLIC PANEL
Anion gap: 12 (ref 5–15)
BUN: 17 mg/dL (ref 6–20)
CO2: 23 mmol/L (ref 22–32)
Calcium: 9.4 mg/dL (ref 8.9–10.3)
Chloride: 104 mmol/L (ref 98–111)
Creatinine, Ser: 0.62 mg/dL (ref 0.44–1.00)
GFR, Estimated: >60 mL/min (ref >60)
Glucose, Bld: 119 mg/dL — ABNORMAL HIGH (ref 70–99)
Potassium: 3.6 mmol/L (ref 3.5–5.1)
Sodium: 139 mmol/L (ref 135–145)

## 2022-09-07 LAB — CBC
HCT: 41.9 % (ref 36.0–46.0)
Hemoglobin: 13.5 g/dL (ref 12.0–15.0)
MCH: 29.4 pg (ref 26.0–34.0)
MCHC: 32.2 g/dL (ref 30.0–36.0)
MCV: 91.3 fL (ref 80.0–100.0)
Platelets: 538 10*3/uL — ABNORMAL HIGH (ref 150–400)
RBC: 4.59 MIL/uL (ref 3.87–5.11)
RDW: 14.5 % (ref 11.5–15.5)
WBC: 12.9 10*3/uL — ABNORMAL HIGH (ref 4.0–10.5)
nRBC: 0 % (ref 0.0–0.2)

## 2022-09-07 LAB — ACETAMINOPHEN LEVEL: Acetaminophen (Tylenol), Serum: <10 ug/mL — ABNORMAL LOW (ref 10–30)

## 2022-09-07 LAB — ETHANOL: Alcohol, Ethyl (B): <10 mg/dL (ref <10)

## 2022-09-07 LAB — SALICYLATE LEVEL: Salicylate Lvl: <7 mg/dL — ABNORMAL LOW (ref 7.0–30.0)

## 2022-09-07 MED ORDER — PALIPERIDONE ER 6 MG PO TB24
6.0000 mg | ORAL_TABLET | Freq: Every day | ORAL | Status: DC
  Administered 2022-09-08: 6 mg via ORAL
  Filled 2022-09-07: qty 1

## 2022-09-07 MED ORDER — LORAZEPAM 1 MG PO TABS
2.0000 mg | ORAL_TABLET | Freq: Once | ORAL | Status: AC
  Administered 2022-09-07: 2 mg via ORAL
  Filled 2022-09-07: qty 2

## 2022-09-07 MED ORDER — DIVALPROEX SODIUM 500 MG PO DR TAB
500.0000 mg | DELAYED_RELEASE_TABLET | Freq: Two times a day (BID) | ORAL | Status: DC
  Administered 2022-09-08 (×2): 500 mg via ORAL
  Filled 2022-09-07 (×2): qty 1

## 2022-09-07 MED ORDER — FAMOTIDINE 20 MG PO TABS
40.0000 mg | ORAL_TABLET | Freq: Every evening | ORAL | Status: DC
  Administered 2022-09-08: 40 mg via ORAL
  Filled 2022-09-07: qty 2

## 2022-09-07 MED ORDER — TRAZODONE HCL 100 MG PO TABS
300.0000 mg | ORAL_TABLET | Freq: Every day | ORAL | Status: DC
  Administered 2022-09-07 – 2022-09-08 (×2): 300 mg via ORAL
  Filled 2022-09-07 (×2): qty 3

## 2022-09-07 MED ORDER — DICLOFENAC SODIUM 75 MG PO TBEC
75.0000 mg | DELAYED_RELEASE_TABLET | Freq: Two times a day (BID) | ORAL | Status: DC
  Administered 2022-09-08 (×2): 75 mg via ORAL
  Filled 2022-09-07 (×3): qty 1

## 2022-09-07 MED ORDER — POTASSIUM CHLORIDE CRYS ER 20 MEQ PO TBCR
40.0000 meq | EXTENDED_RELEASE_TABLET | Freq: Two times a day (BID) | ORAL | Status: DC
  Administered 2022-09-07 – 2022-09-08 (×3): 40 meq via ORAL
  Filled 2022-09-07 (×3): qty 2

## 2022-09-07 MED ORDER — TEMAZEPAM 15 MG PO CAPS
30.0000 mg | ORAL_CAPSULE | Freq: Every evening | ORAL | Status: DC | PRN
  Administered 2022-09-07 – 2022-09-09 (×3): 30 mg via ORAL
  Filled 2022-09-07 (×3): qty 2

## 2022-09-07 MED ORDER — ATORVASTATIN CALCIUM 10 MG PO TABS
20.0000 mg | ORAL_TABLET | Freq: Every day | ORAL | Status: DC
  Administered 2022-09-08: 20 mg via ORAL
  Filled 2022-09-07: qty 2

## 2022-09-07 MED ORDER — BENZTROPINE MESYLATE 0.5 MG PO TABS
0.5000 mg | ORAL_TABLET | Freq: Every day | ORAL | Status: DC
  Administered 2022-09-08: 0.5 mg via ORAL
  Filled 2022-09-07: qty 1

## 2022-09-07 MED ORDER — ONDANSETRON 4 MG PO TBDP: 4.0000 mg | ORAL_TABLET | Freq: Three times a day (TID) | ORAL | Status: DC | PRN

## 2022-09-07 MED ORDER — NICOTINE 21 MG/24HR TD PT24
21.0000 mg | MEDICATED_PATCH | Freq: Every day | TRANSDERMAL | Status: DC
  Administered 2022-09-08: 21 mg via TRANSDERMAL
  Filled 2022-09-07: qty 1

## 2022-09-07 NOTE — Progress Notes (Deleted)
   09/07/22 1926  [facility] Triage Screening (Walk-ins at [facility] only)  How Did You Hear About [place]? Self  What Is the Reason for Your Visit/Call Today? Pt is diagnosed with bipolar disorder and was discharged from [facility] today. She says she was transported to the homeless shelter in [place] and when she arrived she was told the facility was closed from 3:00 pm - 6:30 pm for cleaning. She says it did not appear to be an appropriate place for a person with mental health problems. She spoke to a person on the street who recommended she come to [facility]. Pt denies current suicidal ideation, homicidal ideation, psychotic symptoms. She denies alcohol or substance use. She says she is hungry and needs a safe place to rest.  How Long Has This Been Causing You Problems? <Week  Have You Recently Had Any Thoughts About Hurting Yourself? No  Are You Planning to Commit Suicide/Harm Yourself At This time? No  Have you Recently Had Thoughts About Hurting Someone Karolee Ohs? No  Are You Planning To Harm Someone At This Time? No  Are you currently experiencing any auditory, visual or other hallucinations? No  Have You Used Any Alcohol or Drugs in the Past 24 Hours? No  Do you have any current medical co-morbidities that require immediate attention? No  Clinician description of patient physical appearance/behavior: Pt is casually dressed, alert and oriented x4. Pt speaks in a clear tone, at moderate volume and normal pace. Motor behavior appears normal. Eye contact is good. Pt's mood is euthymic and affect is congruent with mood. Thought process is coherent and relevant. There is no indication she is currently responding to internal stimuli or experiencing delusional thought content. She is calm and cooperative.  What Do You Feel Would Help You the Most Today? Food Assistance;Housing Assistance  If access to [facility] was not available, would you have sought care in the Emergency Department? No  Determination of  Need Routine (7 days)  Options For Referral Outpatient Therapy;Medication Management

## 2022-09-07 NOTE — ED Notes (Signed)
When going over medication list, pt's husband is requesting a call for further medication verification.

## 2022-09-07 NOTE — ED Notes (Addendum)
[name] took pt's glasses and earrings home with him. Pt has dentures on top but not the bottom that is still with her. Pt has one belongings bag in locker 41.

## 2022-09-07 NOTE — ED Provider Notes (Signed)
[place] EMERGENCY DEPARTMENT AT [facility] Provider Note   CSN: [id number] Arrival date & time: 09/07/22  1615     History  Chief Complaint  Patient presents with   Psychiatric Evaluation    Danielle Cox is a 62 y.o. female past medical history seen for hypertension, personality disorder, COPD, fibromyalgia, bipolar, anxiety, recently treated for diverticulitis, and also C. difficile, presents with concern for worsening mania, anxiety.  Has been taking her home medications.  Reports new psychiatrist but has not made significant medication adjustments at this time.  He denies any SI, HI, AVH.  HPI     Home Medications Prior to Admission medications   Medication Sig Start Date End Date Taking? Authorizing Provider  atorvastatin (LIPITOR) 20 MG tablet TAKE 1 TABLET BY MOUTH DAILY 05/28/22   [name], MD  benztropine (COGENTIN) 0.5 MG tablet Take 0.5 mg by mouth daily. 07/17/22   [provider]  diclofenac (VOLTAREN) 75 MG EC tablet Take 1 tablet (75 mg total) by mouth 2 (two) times daily. Patient not taking: Reported on 08/20/2022 12/13/21   [name], MD  divalproex (DEPAKOTE) 500 MG DR tablet Take 500 mg by mouth 2 (two) times daily. 07/14/22   [provider]  famotidine (PEPCID) 40 MG tablet Take 1 tablet (40 mg total) by mouth every evening. 08/31/22 10/30/22  Rhetta Mura, MD  nicotine (NICODERM CQ - DOSED IN MG/24 HOURS) 21 mg/24hr patch Place 1 patch (21 mg total) onto the skin daily. 08/31/22   Rhetta Mura, MD  ondansetron (ZOFRAN-ODT) 4 MG disintegrating tablet Take 1 tablet (4 mg total) by mouth every 8 (eight) hours as needed for nausea or vomiting. 08/31/22   Rhetta Mura, MD  paliperidone (INVEGA) 6 MG 24 hr tablet Take 6 mg by mouth at bedtime.    [provider]  potassium chloride SA (KLOR-CON M) 20 MEQ tablet Take 2 tablets (40 mEq total) by mouth 2 (two) times daily. 08/31/22   Rhetta Mura, MD  QUEtiapine (SEROQUEL) 100 MG tablet Take 100 mg by mouth 2 (two) times daily. Patient not taking: Reported on 08/22/2022 08/09/22   [provider]  temazepam (RESTORIL) 15 MG capsule Take 30 mg by mouth at bedtime as needed for sleep.    [provider]  traZODone (DESYREL) 150 MG tablet Take 300 mg by mouth at bedtime.    [provider]  vancomycin (VANCOCIN) 125 MG capsule Take 1 capsule (125 mg total) by mouth 4 (four) times daily for 7 days. 08/31/22 09/07/22  Rhetta Mura, MD      Allergies    Moxifloxacin hcl and Corticosteroids    Review of Systems   Review of Systems  All other systems reviewed and are negative.   Physical Exam Updated Vital Signs BP (!) 152/70 (BP Location: Right Arm)   Pulse 99   Temp 98 F (36.7 C) (Oral)   Resp 19   Ht 5\' 5"  (1.651 m)   Wt 81.6 kg   LMP 02/12/2016   SpO2 99%   BMI 29.95 kg/m  Physical Exam Vitals and nursing note reviewed.  Constitutional:      General: She is not in acute distress.    Appearance: Normal appearance.  HENT:     Head: Normocephalic and atraumatic.  Eyes:     General:        Right eye: No discharge.        Left eye: No discharge.  Cardiovascular:  Rate and Rhythm: Regular rhythm. Tachycardia present.     Heart sounds: No murmur heard.    No friction rub. No gallop.  Pulmonary:     Effort: Pulmonary effort is normal.     Breath sounds: Normal breath sounds.  Abdominal:     General: Bowel sounds are normal.     Palpations: Abdomen is soft.  Skin:    General: Skin is warm and dry.     Capillary Refill: Capillary refill takes less than 2 seconds.  Neurological:     Mental Status: She is alert and oriented to person, place, and time.  Psychiatric:        Mood and Affect: Mood normal.        Behavior: Behavior normal.     Comments: Anxious, manic behavior, unable to sit still during initial evaluation     ED Results / Procedures / Treatments    Labs (all labs ordered are listed, but only abnormal results are displayed) Labs Reviewed  CBC - Abnormal; Notable for the following components:      Result Value   WBC 12.9 (*)    Platelets 538 (*)    All other components within normal limits  ACETAMINOPHEN LEVEL - Abnormal; Notable for the following components:   Acetaminophen (Tylenol), Serum <10 (*)    All other components within normal limits  BASIC METABOLIC PANEL - Abnormal; Notable for the following components:   Glucose, Bld 119 (*)    All other components within normal limits  SALICYLATE LEVEL - Abnormal; Notable for the following components:   Salicylate Lvl <7.0 (*)    All other components within normal limits  ETHANOL  URINALYSIS, ROUTINE W REFLEX MICROSCOPIC  RAPID URINE DRUG SCREEN, HOSP PERFORMED    EKG None  Radiology No results found.  Procedures Procedures    Medications Ordered in ED Medications  atorvastatin (LIPITOR) tablet 20 mg (has no administration in time range)  benztropine (COGENTIN) tablet 0.5 mg (has no administration in time range)  diclofenac (VOLTAREN) EC tablet 75 mg (has no administration in time range)  divalproex (DEPAKOTE) DR tablet 500 mg (has no administration in time range)  famotidine (PEPCID) tablet 40 mg (has no administration in time range)  nicotine (NICODERM CQ - dosed in mg/24 hours) patch 21 mg (has no administration in time range)  ondansetron (ZOFRAN-ODT) disintegrating tablet 4 mg (has no administration in time range)  paliperidone (INVEGA) 24 hr tablet 6 mg (has no administration in time range)  potassium chloride SA (KLOR-CON M) CR tablet 40 mEq (has no administration in time range)  temazepam (RESTORIL) capsule 30 mg (has no administration in time range)  traZODone (DESYREL) tablet 300 mg (has no administration in time range)  LORazepam (ATIVAN) tablet 2 mg (2 mg Oral Given 09/07/22 1740)  LORazepam (ATIVAN) tablet 2 mg (2 mg Oral Given 09/07/22 1840)    ED  Course/ Medical Decision Making/ A&P                                 Medical Decision Making Amount and/or Complexity of Data Reviewed Labs: ordered.  Risk Prescription drug management.   Patient is a 63 y.o. female  who presents to the emergency department for psychiatric complaint.  Past Medical History: hypertension, personality disorder, COPD, fibromyalgia, bipolar, anxiety, recently treated for diverticulitis, and also C. difficile  Physical Exam: Patient anxious, manic, pacing around the room, but no SI, HI,  AVH  Labs: Medical clearance labs ordered, with following pertinent results: BMP overall unremarkable, CBC with mild leukocytosis, white blood.  0.9, mild thrombocytosis, platelets 538.  Negative salicylate, ethanol, acetaminophen levels.  Medications: I ordered medication including Ativan for anxiety. I have reviewed the patients home medicines and have made adjustments as needed.  Disposition: Patient is otherwise medically cleared at this time pending medical clearance laboratory evaluation. Will consult TTS and appreciate their recommendations. Home meds ordered. Final Clinical Impression(s) / ED Diagnoses Final diagnoses:  None    Rx / DC Orders ED Discharge Orders     None         Olene Floss, PA-C 09/07/22 2316    Loetta Rough, MD 09/08/22 [phone]

## 2022-09-07 NOTE — ED Triage Notes (Signed)
Released from her 2 months ago for bipolar episode and sent to [facility]. Pt has been in a manic episode for a few days. Husband concerned that recent antibiotics interfered with psych medications.  Hx bipolar 1 with severe depression and mania

## 2022-09-07 NOTE — BH Assessment (Signed)
Clinician spoke to Danielle Cox, [place] and noted the pt is sedated and is unable to to engage in the TTS assessment. Clinician asked RN to notify her when the pt is alert and able to engage.    Redmond Pulling, MS, [facility], [facility] Triage Specialist [phone]

## 2022-09-08 DIAGNOSIS — F3162 Bipolar disorder, current episode mixed, moderate: Secondary | ICD-10-CM | POA: Diagnosis not present

## 2022-09-08 LAB — URINALYSIS, ROUTINE W REFLEX MICROSCOPIC
Bilirubin Urine: NEGATIVE
Glucose, UA: NEGATIVE mg/dL
Hgb urine dipstick: NEGATIVE
Ketones, ur: 20 mg/dL — AB
Leukocytes,Ua: NEGATIVE
Nitrite: NEGATIVE
Protein, ur: NEGATIVE mg/dL
Specific Gravity, Urine: 1.027 (ref 1.005–1.030)
pH: 6 (ref 5.0–8.0)

## 2022-09-08 LAB — RAPID URINE DRUG SCREEN, HOSP PERFORMED
Amphetamines: NOT DETECTED
Barbiturates: NOT DETECTED
Benzodiazepines: POSITIVE — AB
Cocaine: NOT DETECTED
Opiates: NOT DETECTED
Tetrahydrocannabinol: NOT DETECTED

## 2022-09-08 MED ORDER — PALIPERIDONE ER 6 MG PO TB24
9.0000 mg | ORAL_TABLET | Freq: Every day | ORAL | Status: DC
  Filled 2022-09-08: qty 1

## 2022-09-08 MED ORDER — BUSPIRONE HCL 10 MG PO TABS
5.0000 mg | ORAL_TABLET | Freq: Two times a day (BID) | ORAL | Status: DC
  Administered 2022-09-08: 5 mg via ORAL
  Filled 2022-09-08: qty 1

## 2022-09-08 MED ORDER — DIVALPROEX SODIUM 500 MG PO DR TAB: 500.0000 mg | DELAYED_RELEASE_TABLET | Freq: Every day | ORAL | Status: DC

## 2022-09-08 MED ORDER — PALIPERIDONE ER 3 MG PO TB24
3.0000 mg | ORAL_TABLET | ORAL | Status: AC
  Administered 2022-09-08: 3 mg via ORAL
  Filled 2022-09-08: qty 1

## 2022-09-08 MED ORDER — LORAZEPAM 2 MG/ML IJ SOLN
1.0000 mg | Freq: Once | INTRAMUSCULAR | Status: AC
  Administered 2022-09-08: 1 mg via INTRAMUSCULAR
  Filled 2022-09-08: qty 1

## 2022-09-08 MED ORDER — DIVALPROEX SODIUM 500 MG PO DR TAB
1000.0000 mg | DELAYED_RELEASE_TABLET | Freq: Every day | ORAL | Status: DC
  Administered 2022-09-08: 1000 mg via ORAL
  Filled 2022-09-08: qty 2

## 2022-09-08 MED ORDER — NICOTINE POLACRILEX 2 MG MT GUM
2.0000 mg | CHEWING_GUM | OROMUCOSAL | Status: DC
  Filled 2022-09-08: qty 1

## 2022-09-08 MED ORDER — LORAZEPAM 0.5 MG PO TABS: 0.5000 mg | ORAL_TABLET | Freq: Four times a day (QID) | ORAL | Status: DC | PRN

## 2022-09-08 MED ORDER — LORAZEPAM 1 MG PO TABS
1.0000 mg | ORAL_TABLET | ORAL | Status: AC
  Administered 2022-09-08: 1 mg via ORAL
  Filled 2022-09-08: qty 1

## 2022-09-08 NOTE — BH Assessment (Signed)
At 0601, clinician received a messaged from [place], RN stating the pt is awake. Clinician expressed pt to be seen during day shift.    Redmond Pulling, MS, [facility], [facility] Triage Specialist [phone]

## 2022-09-08 NOTE — Progress Notes (Signed)
LCSW Progress Note  [id number]   Danielle Cox  09/08/2022  5:54 PM    Inpatient Behavioral Health Placement  Pt meets inpatient criteria per Alona Bene, PMHNP. There are no available beds within [facility] per [facility] Antoinette Cillo, RN. Referral was sent to the following facilities;   Destination  Service Provider Address Phone [facility] [place] [facility]  [address] [place], [place] [address] [phone]  [facility]  [address] [place] [address] [phone]  [facility] [address]  [place] [address] [phone]  [facility]  [facility] [address] [place] [address] [phone]  [facility]  [address] [place] [address] [phone]  [facility] [address]  [address] [place] [address] [phone]  [facility]  [address] [place] [address] [phone]  [facility]  [address] [place] [address] [phone]  [facility]  [address] [place], [place] [address] [phone]  [facility]  [address] [place] [address] [phone]  [facility]  [address] [place] [address] [phone]  [facility]  [address], [place] [address] [phone]  [facility]  [address] [place] [address] [phone]  [facility]  [address], [place] [address] [phone]  [facility]  [address] [place] [address] [phone]  [facility]  [address] [place] [address] [phone]  [facility]  Behavioral Health  [place] [phone]  [facility]  [address] [place] [address] [phone]  [facility] EFAX  [address] [place], [place] [phone]  [facility]  [address] [place] [address] [phone]  [facility]  [address], [place] [address] [phone]  [facility]  [address], [place] [address] [phone]  [facility]  [address] [place], [place] [address] [phone]  [facility]  [address], [place] [address] [phone]  [facility] Inpatient [place]  [place] [phone]  [facility]  [address] [place] [address] [phone]  [facility] Patient Placement  [facility], [place] [phone]    Situation ongoing,  CSW will follow up.    Maryjean Ka, MSW, [facility] 09/08/2022 5:54 PM

## 2022-09-08 NOTE — ED Notes (Signed)
Patient calm and resting all night, up now from bed reporting restlessness.

## 2022-09-08 NOTE — ED Notes (Signed)
Pt resting.

## 2022-09-08 NOTE — Progress Notes (Signed)
09/08/2022  1038  Notified Psy NP that patient's husband wanted an update. Onalee Hua [phone]

## 2022-09-08 NOTE — Consult Note (Signed)
BH ED ASSESSMENT   Reason for Consult: anxiety Referring Physician: Luther Hearing, PA-C Patient Identification: COLEE DRYE MRN:  [id number] ED Chief Complaint: Bipolar disorder, current episode mixed, unspecified (HCC)  Diagnosis:  Principal Problem:   Bipolar disorder, current episode mixed, unspecified (HCC) Active Problems:   Anxiety disorder, unspecified   ED Assessment Time Calculation: Start Time: 1000 Stop Time: 1040 Total Time in Minutes (Assessment Completion): 40   Subjective: SHIANA GROSSE is a 62 y.o. female past medical history seen for hypertension, personality disorder, COPD, fibromyalgia, bipolar, anxiety, recently treated for diverticulitis, and also C. difficile, presents with concern for worsening mania, anxiety.     WGN:FAOZHY [employer], 62 y.o., female patient seen face to face by this provider, consulted with Dr. Clovis Riley; and chart reviewed on 09/08/22.  On evaluation EMERLYNN BAGNATO who reports to feeling agitated, frustrated, "and I cannot lay still."Patient continues to say that she feels shaky and nervous, states that she does not feel like this is mania, she says she knows when she is manic she is more hyperverbal, more happy, and full of energy.  She states now that she feels the opposite she feels like she is more depressed, says that she is not eating much, states she will eat oatmeal in the morning, shake at lunch, sometimes she does not eat dinner and so was a piece of chicken.  She states she sleeps from about 9:30 PM to 2 30 a.m. which is not a lot of sleep for her, she states she usually sleeps throughout the night until about 8 or 9 AM.  She endorses SI with no plan, denies HI/AVH.  She states that she is feeling more hopeless and depressed because she has not done anything prosperous with her life, she feels sometimes like a burden to her husband because he gets upset because he feels that she will not help herself.  She states she has 3 children  who are good to her and very supportive.  Patient states that she is compliant with her medications since leaving [facility] about 2 months ago, felt that it was a good place for her, sometimes she does not feel like her medications are working.  She denies using any alcohol or illicit substances, UDS positive for benzodiazepines which were given in the hospital and BAL less than 10.  During evaluation TASHEE WIER is standing in her room, restless and shaky.  She is alert, oriented x 4, anxious, cooperative, with inattentiveness. Her mood is sad with congruent affect.  She has normal speech, low volume.  Thought process is coherent, thought content logical and within normal limits. She endorses signs of depression, insomnia, anhedonia, changes in her appetite, feelings of worthlessness/guilt, endorses suicidal ideation with no plan, denies homicidal ideation. She denies mania, impulsivity, grandiosity, irritability, and risky behaviors. On assessing for psychosis, she denies auditory/visual hallucination, and delusion. She also denies flashbacks, nightmares, hypervigilance and not responding to any internal nor external stimuli.   Spoke with patient's husband Allesandra Breyer [phone], he stated the patient has a history of bipolar, she recently had a double bacterial infection in her colon, E. coli and she was just discharged from the hospital on Friday, 08/31/22.  He feels that maybe her antibiotics interfere with her psychiatric medicine, which is what patient is displaying anxious restless behavior.  He states that patient is violently shaky, restless she stares off into the distance "like a zombie."And she repeats over and over "I do not know  what to do." Patient is followed by [facility] for mental health medication management and is seen approximately every 6 weeks.  He states that they are looking to change their psychiatrist, she has been with the psychiatrist for about 3 years and has been  hospitalized about 5 or 6 times.  He confirms that, patient does not use illicit substances or alcohol. She smokes cigarettes with an estimated 2 packs her day, he states because she has only been sleeping about 5 hours a night and waking up early in the morning it has increased her cigarette intake and she has increased to about 3 to 4 packs of cigarettes a day, and he is now hide the limit how many cigarettes she smokes per day, which frustrates patient, he states "I cannot get her to calm down, she is a mess."He states he is willing to take patient back home but he needs her to be compliant with medications and more stable in mood and behavior.    Past Psychiatric History: Bipolar Disorder, Prior Suicide Attempt -Overdose    Risk to Self or Others: Risk to Self:  Passive SI  Risk to Others:  No  Prior Inpatient Therapy:  Yes  Prior Outpatient Therapy: Yes,  [facility]  [place] Scale:  Flowsheet Row ED from 09/07/2022 in [facility] Emergency Department at [facility] ED to Hosp-Admission (Discharged) from 08/22/2022 in [facility] LONG 4TH FLOOR PROGRESSIVE CARE AND UROLOGY ED from 05/31/2022 in [facility] Emergency Department at [facility]  C-SSRS RISK CATEGORY No Risk No Risk No Risk       AIMS:  , , ,  ,   ASAM:    Substance Abuse:     Past Medical History:  Past Medical History:  Diagnosis Date   Adenomatous polyp of ascending colon 05/02/2020   Anxiety    Bipolar affective disorder (HCC) 05/17/2019   Bipolar disorder (HCC)    Bronchitis, chronic (HCC)    COPD (chronic obstructive pulmonary disease) (HCC)    Depression    Essential hypertension 02/17/2009   Fibromyalgia 11/21/2017   Gastropathy 08/26/2022   History of Clostridioides difficile colitis 08/26/2022   Insulin resistance 08/24/2018   Personality disorder (HCC)     Past Surgical History:  Procedure Laterality Date   CARPAL TUNNEL RELEASE Right    CESAREAN SECTION  1983, 1988, 36    TONSILLECTOMY  age 75   Family History:  Family History  Problem Relation Age of Onset   Cancer Mother    Depression Mother    Alcohol abuse Mother    Depression Father    Alcohol abuse Father    Cancer Sister    Alcohol abuse Sister    Depression Sister    Alcohol abuse Brother    Depression Brother        overdose   Breast cancer Neg Hx    Social History:  Social History   Substance and Sexual Activity  Alcohol Use No     Social History   Substance and Sexual Activity  Drug Use No    Social History   Socioeconomic History   Marital status: Married    Spouse name: Onalee Hua   Number of children: 3   Years of education: Not on file   Highest education level: GED or equivalent  Occupational History   Occupation: disabled  Tobacco Use   Smoking status: Every Day    Current packs/day: 1.50    Average packs/day: 1.5 packs/day for 40.0  years (60.0 ttl pk-yrs)    Types: Cigarettes   Smokeless tobacco: Never  Vaping Use   Vaping status: Never Used  Substance and Sexual Activity   Alcohol use: No   Drug use: No   Sexual activity: Not Currently    Partners: Male    Birth control/protection: None  Other Topics Concern   Not on file  Social History Narrative   10/29/2012 [name] Taneah was born and grew up in [place], [place]. She has 2 brothers and one sister. She reports that her childhood was "poor," and explains that she needs both financially and emotionally. She completed the 10th grade, then achieved her GED. She has been married twice. The first time at age 12, and that ended after 1 year. She is currently married to her second husband of 27 years. She has 2 sons and one daughter. She is currently unemployed and on disability for 2 years. She lives with her husband, her daughter, and one of her daughter's friends. She denies any legal difficulties. She reports that she is spiritual but not religious. Her hobbies include reading and cross stitch. She reports that she has no  social support network. 10/29/2012 [name]      Patient is right-handed.she lives with her husband ina one story home with a few steps to enter. She drinks decaff tea.  She is active around the home.   Drinks gatorade and propel  daily    Social Determinants of Health   Financial Resource Strain: Low Risk  (01/29/2022)   Overall Financial Resource Strain (CARDIA)    Difficulty of Paying Living Expenses: Not hard at all  Food Insecurity: No Food Insecurity (08/23/2022)   Hunger Vital Sign    Worried About Running Out of Food in the Last Year: Never true    Ran Out of Food in the Last Year: Never true  Transportation Needs: No Transportation Needs (08/23/2022)   PRAPARE - Administrator, Civil Service (Medical): No    Lack of Transportation (Non-Medical): No  Physical Activity: Insufficiently Active (01/29/2022)   Exercise Vital Sign    Days of Exercise per Week: 5 days    Minutes of Exercise per Session: 20 min  Stress: No Stress Concern Present (01/29/2022)   [employer] of Occupational Health - Occupational Stress Questionnaire    Feeling of Stress : Only a little  Social Connections: Moderately Isolated (01/29/2022)   Social Connection and Isolation Panel [NHANES]    Frequency of Communication with Friends and Family: More than three times a week    Frequency of Social Gatherings with Friends and Family: More than three times a week    Attends Religious Services: Never    Database administrator or Organizations: No    Attends Banker Meetings: Never    Marital Status: Married      Allergies:   Allergies  Allergen Reactions   Moxifloxacin Hcl Palpitations    REACTION: tackycardia   Corticosteroids Other (See Comments)    Mania    Labs:  Results for orders placed or performed during the hospital encounter of 09/07/22 (from the past 48 hour(s))  CBC     Status: Abnormal   Collection Time: 09/07/22  5:57 PM  Result Value Ref Range   WBC 12.9 (H) 4.0 -  10.5 K/uL   RBC 4.59 3.87 - 5.11 MIL/uL   Hemoglobin 13.5 12.0 - 15.0 g/dL   HCT 86.5 78.4 - 69.6 %   MCV 91.3 80.0 -  100.0 fL   MCH 29.4 26.0 - 34.0 pg   MCHC 32.2 30.0 - 36.0 g/dL   RDW 56.2 13.0 - 86.5 %   Platelets 538 (H) 150 - 400 K/uL   nRBC 0.0 0.0 - 0.2 %    Comment: Performed at [facility], [address], [place], [place] [address]  Acetaminophen level     Status: Abnormal   Collection Time: 09/07/22  5:57 PM  Result Value Ref Range   Acetaminophen (Tylenol), Serum <10 (L) 10 - 30 ug/mL    Comment: (NOTE) Therapeutic concentrations vary significantly. A range of 10-30 ug/mL  may be an effective concentration for many patients. However, some  are best treated at concentrations outside of this range. Acetaminophen concentrations >150 ug/mL at 4 hours after ingestion  and >50 ug/mL at 12 hours after ingestion are often associated with  toxic reactions.  Performed at [facility], [address] [place], [place] [address]   Basic metabolic panel     Status: Abnormal   Collection Time: 09/07/22  5:57 PM  Result Value Ref Range   Sodium 139 135 - 145 mmol/L   Potassium 3.6 3.5 - 5.1 mmol/L   Chloride 104 98 - 111 mmol/L   CO2 23 22 - 32 mmol/L   Glucose, Bld 119 (H) 70 - 99 mg/dL    Comment: Glucose reference range applies only to samples taken after fasting for at least 8 hours.   BUN 17 6 - 20 mg/dL   Creatinine, Ser 8.41 0.44 - 1.00 mg/dL   Calcium 9.4 8.9 - 32.4 mg/dL   GFR, Estimated >40 >10 mL/min    Comment: (NOTE) Calculated using the CKD-EPI Creatinine Equation (2021)    Anion gap 12 5 - 15    Comment: Performed at [facility], [address], [place], [place] [address]  Salicylate level     Status: Abnormal   Collection Time: 09/07/22  5:57 PM  Result Value Ref Range   Salicylate Lvl <7.0 (L) 7.0 - 30.0 mg/dL    Comment: Performed at [facility], [address]  [place], [place] [address]  Ethanol     Status: None   Collection Time: 09/07/22  5:57 PM  Result Value Ref Range   Alcohol, Ethyl (B) <10 <10 mg/dL    Comment: (NOTE) Lowest detectable limit for serum alcohol is 10 mg/dL.  For medical purposes only. Performed at [facility], [address], [place], [place] [address]   Urinalysis, Routine w reflex microscopic -Urine, Clean Catch     Status: Abnormal   Collection Time: 09/07/22 11:37 PM  Result Value Ref Range   Color, Urine YELLOW YELLOW   APPearance CLEAR CLEAR   Specific Gravity, Urine 1.027 1.005 - 1.030   pH 6.0 5.0 - 8.0   Glucose, UA NEGATIVE NEGATIVE mg/dL   Hgb urine dipstick NEGATIVE NEGATIVE   Bilirubin Urine NEGATIVE NEGATIVE   Ketones, ur 20 (A) NEGATIVE mg/dL   Protein, ur NEGATIVE NEGATIVE mg/dL   Nitrite NEGATIVE NEGATIVE   Leukocytes,Ua NEGATIVE NEGATIVE    Comment: Performed at [facility], [address], [place], [place] [address]  Rapid urine drug screen (hospital performed)     Status: Abnormal   Collection Time: 09/07/22 11:37 PM  Result Value Ref Range   Opiates NONE DETECTED NONE DETECTED   Cocaine NONE DETECTED NONE DETECTED   Benzodiazepines POSITIVE (A) NONE DETECTED   Amphetamines NONE  DETECTED NONE DETECTED   Tetrahydrocannabinol NONE DETECTED NONE DETECTED   Barbiturates NONE DETECTED NONE DETECTED    Comment: (NOTE) DRUG SCREEN FOR MEDICAL PURPOSES ONLY.  IF CONFIRMATION IS NEEDED FOR ANY PURPOSE, NOTIFY LAB WITHIN 5 DAYS.  LOWEST DETECTABLE LIMITS FOR URINE DRUG SCREEN Drug Class                     Cutoff (ng/mL) Amphetamine and metabolites    1000 Barbiturate and metabolites    200 Benzodiazepine                 200 Opiates and metabolites        300 Cocaine and metabolites        300 THC                            50 Performed at [facility], [address], [place], [place] [address]     Current Facility-Administered  Medications  Medication Dose Route Frequency Provider Last Rate Last Admin   atorvastatin (LIPITOR) tablet 20 mg  20 mg Oral Daily Prosperi, Christian H, PA-C   20 mg at 09/08/22 1014   benztropine (COGENTIN) tablet 0.5 mg  0.5 mg Oral Daily Prosperi, Christian H, PA-C   0.5 mg at 09/08/22 1014   busPIRone (BUSPAR) tablet 5 mg  5 mg Oral BID Motley-Mangrum, Fani Rotondo A, PMHNP   5 mg at 09/08/22 1220   diclofenac (VOLTAREN) EC tablet 75 mg  75 mg Oral BID Prosperi, Christian H, PA-C   75 mg at 09/08/22 1014   divalproex (DEPAKOTE) DR tablet 500 mg  500 mg Oral BID Prosperi, Christian H, PA-C   500 mg at 09/08/22 1014   famotidine (PEPCID) tablet 40 mg  40 mg Oral QPM Prosperi, Christian H, PA-C       nicotine (NICODERM CQ - dosed in mg/24 hours) patch 21 mg  21 mg Transdermal Daily Prosperi, Christian H, PA-C   21 mg at 09/08/22 1014   nicotine polacrilex (NICORETTE) gum 2 mg  2 mg Oral Q4H while awake Motley-Mangrum, Bretta Fees A, PMHNP       ondansetron (ZOFRAN-ODT) disintegrating tablet 4 mg  4 mg Oral Q8H PRN Prosperi, Christian H, PA-C       paliperidone (INVEGA) 24 hr tablet 6 mg  6 mg Oral Daily Prosperi, Christian H, PA-C   6 mg at 09/08/22 1014   potassium chloride SA (KLOR-CON M) CR tablet 40 mEq  40 mEq Oral BID Prosperi, Christian H, PA-C   40 mEq at 09/08/22 1014   temazepam (RESTORIL) capsule 30 mg  30 mg Oral QHS PRN Prosperi, Christian H, PA-C   30 mg at 09/07/22 2341   traZODone (DESYREL) tablet 300 mg  300 mg Oral QHS Prosperi, Christian H, PA-C   300 mg at 09/07/22 2343   Current Outpatient Medications  Medication Sig Dispense Refill   atorvastatin (LIPITOR) 20 MG tablet TAKE 1 TABLET BY MOUTH DAILY 90 tablet 1   benztropine (COGENTIN) 0.5 MG tablet Take 0.5 mg by mouth daily.     divalproex (DEPAKOTE) 500 MG DR tablet Take 500 mg by mouth 2 (two) times daily.     famotidine (PEPCID) 40 MG tablet Take 1 tablet (40 mg total) by mouth every evening. 30 tablet 1   nicotine (NICODERM CQ  - DOSED IN MG/24 HOURS) 21 mg/24hr patch Place 1 patch (21 mg total) onto the skin  daily. 28 patch 0   ondansetron (ZOFRAN-ODT) 4 MG disintegrating tablet Take 1 tablet (4 mg total) by mouth every 8 (eight) hours as needed for nausea or vomiting. 20 tablet 0   paliperidone (INVEGA) 6 MG 24 hr tablet Take 6 mg by mouth at bedtime.     temazepam (RESTORIL) 15 MG capsule Take 30 mg by mouth at bedtime as needed for sleep.     traZODone (DESYREL) 150 MG tablet Take 300 mg by mouth at bedtime.     diclofenac (VOLTAREN) 75 MG EC tablet Take 1 tablet (75 mg total) by mouth 2 (two) times daily. (Patient not taking: Reported on 08/20/2022) 30 tablet 0   potassium chloride SA (KLOR-CON M) 20 MEQ tablet Take 2 tablets (40 mEq total) by mouth 2 (two) times daily. (Patient not taking: Reported on 09/08/2022) 20 tablet 0   QUEtiapine (SEROQUEL) 100 MG tablet Take 100 mg by mouth 2 (two) times daily. (Patient not taking: Reported on 08/22/2022)      Musculoskeletal: Strength & Muscle Tone: within normal limits Gait & Station: normal Patient leans: N/A   Psychiatric Specialty Exam: Presentation  General Appearance:  Disheveled  Eye Contact: Fleeting  Speech: Clear and Coherent  Speech Volume: Normal  Handedness: Right   Mood and Affect  Mood: Anxious; Depressed; Hopeless  Affect: Depressed; Flat   Thought Process  Thought Processes: Coherent  Descriptions of Associations:Intact  Orientation:Full (Time, Place and Person)  Thought Content:Logical  History of Schizophrenia/Schizoaffective disorder:No data recorded Duration of Psychotic Symptoms:No data recorded Hallucinations:Hallucinations: None  Ideas of Reference:None  Suicidal Thoughts:Suicidal Thoughts: Yes, Passive SI Passive Intent and/or Plan: Without Plan  Homicidal Thoughts:Homicidal Thoughts: No   Sensorium  Memory: Immediate Good; Recent Fair  Judgment: Fair  Insight: Fair   Producer, television/film/video: Fair  Attention Span: Fair  Recall: [employer] of Knowledge: Fair  Language: Fair   Psychomotor Activity  Psychomotor Activity: Psychomotor Activity: Restlessness; Increased   Assets  Assets: Communication Skills; Desire for Improvement; Housing; Intimacy; Social Support    Sleep  Sleep: Sleep: Poor   Physical Exam: Physical Exam Vitals and nursing note reviewed. Exam conducted with a chaperone present.  Musculoskeletal:        General: Normal range of motion.  Neurological:     Mental Status: She is alert.  Psychiatric:        Attention and Perception: Attention normal.        Mood and Affect: Mood is anxious and depressed.        Speech: Speech normal.        Behavior: Behavior is cooperative.        Thought Content: Thought content includes suicidal ideation.        Cognition and Memory: Memory normal.        Judgment: Judgment normal.    Review of Systems  Constitutional: Negative.   Psychiatric/Behavioral:  Positive for depression and suicidal ideas. The patient is nervous/anxious.    Blood pressure (!) 118/56, pulse 83, temperature 98.3 F (36.8 C), temperature source Oral, resp. rate 18, height 5\' 5"  (1.651 m), weight 81.6 kg, last menstrual period 02/12/2016, SpO2 98%. Body mass index is 29.95 kg/m.   Medical Decision Making: Patient case review and discussed with Dr. Clovis Riley. Patient needs inpatient psychiatric admission for stabilization and treatment.  Make medication adjustments increased her Invega to 9 mg daily p.o., increase Depakote to 1000 mg at bedtime.  Restarted home medications. EDP, RN, and LCSW notified  of disposition.     Disposition: Recommend psychiatric Inpatient admission.  Jaivyn Gulla MOTLEY-MANGRUM, PMHNP 09/08/2022 1:09 PM

## 2022-09-08 NOTE — ED Notes (Addendum)
Pt wake and in the bathroom

## 2022-09-08 NOTE — Progress Notes (Signed)
Pt was accepted to [facility] TOMORROW 09/09/2022; Bed Assignment Erlene Quan  Pt meets inpatient criteria per Alona Bene, PMHNP  Attending Physician will be Clarene Duke, MD  Report can be called to: - [phone]  Pt can arrive after 9:00pm  Care Team notified: [facility], PMHNP    Kelton Pillar, LCSWA 09/08/2022 @ 7:15 PM

## 2022-09-08 NOTE — ED Notes (Signed)
Patient reports being anxious and unsure what to do. Offered patient a crossword puzzle but she declined since she didn't have her glasses.  She paced around in her room.

## 2022-09-08 NOTE — ED Notes (Signed)
Pt up and down stating they're restless

## 2022-09-09 DIAGNOSIS — F17200 Nicotine dependence, unspecified, uncomplicated: Secondary | ICD-10-CM | POA: Diagnosis not present

## 2022-09-09 DIAGNOSIS — G4700 Insomnia, unspecified: Secondary | ICD-10-CM | POA: Diagnosis not present

## 2022-09-09 DIAGNOSIS — J449 Chronic obstructive pulmonary disease, unspecified: Secondary | ICD-10-CM | POA: Diagnosis not present

## 2022-09-09 DIAGNOSIS — F3160 Bipolar disorder, current episode mixed, unspecified: Secondary | ICD-10-CM | POA: Diagnosis not present

## 2022-09-09 DIAGNOSIS — F419 Anxiety disorder, unspecified: Secondary | ICD-10-CM | POA: Diagnosis not present

## 2022-09-09 DIAGNOSIS — F319 Bipolar disorder, unspecified: Secondary | ICD-10-CM | POA: Diagnosis not present

## 2022-09-09 DIAGNOSIS — M797 Fibromyalgia: Secondary | ICD-10-CM | POA: Diagnosis not present

## 2022-09-09 DIAGNOSIS — F3163 Bipolar disorder, current episode mixed, severe, without psychotic features: Secondary | ICD-10-CM | POA: Diagnosis not present

## 2022-09-09 DIAGNOSIS — R45851 Suicidal ideations: Secondary | ICD-10-CM | POA: Diagnosis not present

## 2022-09-09 DIAGNOSIS — E785 Hyperlipidemia, unspecified: Secondary | ICD-10-CM | POA: Diagnosis not present

## 2022-09-09 DIAGNOSIS — Z9151 Personal history of suicidal behavior: Secondary | ICD-10-CM | POA: Diagnosis not present

## 2022-09-09 DIAGNOSIS — F17210 Nicotine dependence, cigarettes, uncomplicated: Secondary | ICD-10-CM | POA: Diagnosis not present

## 2022-09-09 NOTE — Progress Notes (Signed)
09/09/2022  0800 Called report to [facility] [phone]. Report given to [facility].

## 2022-09-09 NOTE — ED Notes (Signed)
Patient up pacing around room, constantly coming out saying "I'm restless".

## 2022-09-09 NOTE — ED Provider Notes (Signed)
Emergency Medicine Observation Re-evaluation Note  Danielle Cox is a 62 y.o. female, seen on rounds today.  Pt initially presented to the ED for complaints of Psychiatric Evaluation Currently, the patient is asleep, no complaints.  Physical Exam  BP 117/75 (BP Location: Right Arm)   Pulse 88   Temp 97.8 F (36.6 C) (Oral)   Resp 18   Ht 5\' 5"  (1.651 m)   Wt 81.6 kg   LMP 02/12/2016   SpO2 95%   BMI 29.95 kg/m  Physical Exam General: Asleep, no acute distress Cardiac: Regular rate Lungs: No increased work of breathing Psych: Calm, sleep  ED Course / MDM  EKG:   I have reviewed the labs performed to date as well as medications administered while in observation.  Recent changes in the last 24 hours include patient is medically cleared.  Recommended for inpatient psych.  Plan  Current plan is for transfer to [facility] today.    Elayne Snare K, DO 09/09/22 [phone]

## 2022-09-10 ENCOUNTER — Other Ambulatory Visit (HOSPITAL_COMMUNITY): Payer: Self-pay

## 2022-09-25 ENCOUNTER — Inpatient Hospital Stay: Payer: Medicare PPO | Admitting: Family Medicine

## 2022-09-25 DIAGNOSIS — G4700 Insomnia, unspecified: Secondary | ICD-10-CM | POA: Diagnosis not present

## 2022-09-25 DIAGNOSIS — F419 Anxiety disorder, unspecified: Secondary | ICD-10-CM | POA: Diagnosis not present

## 2022-09-25 DIAGNOSIS — F17200 Nicotine dependence, unspecified, uncomplicated: Secondary | ICD-10-CM | POA: Diagnosis not present

## 2022-09-25 DIAGNOSIS — F3160 Bipolar disorder, current episode mixed, unspecified: Secondary | ICD-10-CM | POA: Diagnosis not present

## 2022-10-09 DIAGNOSIS — F17200 Nicotine dependence, unspecified, uncomplicated: Secondary | ICD-10-CM | POA: Diagnosis not present

## 2022-10-09 DIAGNOSIS — G4700 Insomnia, unspecified: Secondary | ICD-10-CM | POA: Diagnosis not present

## 2022-10-09 DIAGNOSIS — F3160 Bipolar disorder, current episode mixed, unspecified: Secondary | ICD-10-CM | POA: Diagnosis not present

## 2022-10-09 DIAGNOSIS — F419 Anxiety disorder, unspecified: Secondary | ICD-10-CM | POA: Diagnosis not present

## 2022-11-07 DIAGNOSIS — F419 Anxiety disorder, unspecified: Secondary | ICD-10-CM | POA: Diagnosis not present

## 2022-11-07 DIAGNOSIS — F17200 Nicotine dependence, unspecified, uncomplicated: Secondary | ICD-10-CM | POA: Diagnosis not present

## 2022-11-07 DIAGNOSIS — F3160 Bipolar disorder, current episode mixed, unspecified: Secondary | ICD-10-CM | POA: Diagnosis not present

## 2022-11-07 DIAGNOSIS — G4700 Insomnia, unspecified: Secondary | ICD-10-CM | POA: Diagnosis not present

## 2022-11-14 ENCOUNTER — Telehealth: Payer: Self-pay | Admitting: Family Medicine

## 2022-11-14 ENCOUNTER — Encounter: Payer: Self-pay | Admitting: Family Medicine

## 2022-11-14 ENCOUNTER — Ambulatory Visit (INDEPENDENT_AMBULATORY_CARE_PROVIDER_SITE_OTHER): Payer: Medicare PPO | Admitting: Family Medicine

## 2022-11-14 VITALS — BP 126/77 | HR 98 | Temp 97.3°F | Ht 65.0 in | Wt 188.6 lb

## 2022-11-14 DIAGNOSIS — R109 Unspecified abdominal pain: Secondary | ICD-10-CM

## 2022-11-14 DIAGNOSIS — N939 Abnormal uterine and vaginal bleeding, unspecified: Secondary | ICD-10-CM | POA: Diagnosis not present

## 2022-11-14 DIAGNOSIS — I10 Essential (primary) hypertension: Secondary | ICD-10-CM | POA: Diagnosis not present

## 2022-11-14 DIAGNOSIS — F319 Bipolar disorder, unspecified: Secondary | ICD-10-CM | POA: Diagnosis not present

## 2022-11-14 LAB — COMPREHENSIVE METABOLIC PANEL
ALT: 22 U/L (ref 0–35)
AST: 14 U/L (ref 0–37)
Albumin: 4.3 g/dL (ref 3.5–5.2)
Alkaline Phosphatase: 89 U/L (ref 39–117)
BUN: 17 mg/dL (ref 6–23)
CO2: 28 meq/L (ref 19–32)
Calcium: 10.1 mg/dL (ref 8.4–10.5)
Chloride: 105 meq/L (ref 96–112)
Creatinine, Ser: 0.63 mg/dL (ref 0.40–1.20)
GFR: 96.41 mL/min (ref >60.00)
Glucose, Bld: 103 mg/dL — ABNORMAL HIGH (ref 70–99)
Potassium: 4 meq/L (ref 3.5–5.1)
Sodium: 142 meq/L (ref 135–145)
Total Bilirubin: 0.4 mg/dL (ref 0.2–1.2)
Total Protein: 7.2 g/dL (ref 6.0–8.3)

## 2022-11-14 LAB — CBC
HCT: 45.3 % (ref 36.0–46.0)
Hemoglobin: 14.9 g/dL (ref 12.0–15.0)
MCHC: 32.9 g/dL (ref 30.0–36.0)
MCV: 90.5 fL (ref 78.0–100.0)
Platelets: 310 10*3/uL (ref 150.0–400.0)
RBC: 5.01 Mil/uL (ref 3.87–5.11)
RDW: 14.9 % (ref 11.5–15.5)
WBC: 9.8 10*3/uL (ref 4.0–10.5)

## 2022-11-14 LAB — URINALYSIS, ROUTINE W REFLEX MICROSCOPIC
Bilirubin Urine: NEGATIVE
Ketones, ur: NEGATIVE
Leukocytes,Ua: NEGATIVE
Nitrite: NEGATIVE
Specific Gravity, Urine: 1.015 (ref 1.000–1.030)
Total Protein, Urine: NEGATIVE
Urine Glucose: NEGATIVE
Urobilinogen, UA: 0.2 (ref 0.0–1.0)
pH: 6 (ref 5.0–8.0)

## 2022-11-14 LAB — LIPASE: Lipase: 20 U/L (ref 11.0–59.0)

## 2022-11-14 MED ORDER — AMOXICILLIN-POT CLAVULANATE 875-125 MG PO TABS: 1.0000 | ORAL_TABLET | Freq: Two times a day (BID) | ORAL | 0 refills | Status: DC

## 2022-11-14 NOTE — Patient Instructions (Signed)
It was very nice to see you today!  You may have a stomach bug.  We will check blood work and a urine sample to make sure that you do not have any serious infections.  Please start the Augmentin only if you are symptoms worsen or if you develop any worsening fevers.  Please let us know if your symptoms are not improving in the next several days.  Return if symptoms worsen or fail to improve.   Take care, Dr [name]  PLEASE NOTE:  If you had any lab tests, please let us know if you have not heard back within a few days. You may see your results on mychart before we have a chance to review them but we will give you a call once they are reviewed by [place].   If we ordered any referrals today, please let us know if you have not heard from their office within the next week.   If you had any urgent prescriptions sent in today, please check with the pharmacy within an hour of our visit to make sure the prescription was transmitted appropriately.   Please try these tips to maintain a healthy lifestyle:  Eat at least 3 REAL meals and 1-2 snacks per day.  Aim for no more than 5 hours between eating.  If you eat breakfast, please do so within one hour of getting up.   Each meal should contain half fruits/vegetables, one quarter protein, and one quarter carbs (no bigger than a computer mouse)  Cut down on sweet beverages. This includes juice, soda, and sweet tea.   Drink at least 1 glass of water with each meal and aim for at least 8 glasses per day  Exercise at least 150 minutes every week.

## 2022-11-14 NOTE — Progress Notes (Signed)
   Danielle Cox is a 62 y.o. female who presents today for an office visit.  Assessment/Plan:  New/Acute Problems: Abdominal Pain  Reassuring exam today.  No signs of systemic illness.  Likely had a viral GI illness that is resolving however in light of her recent hospitalization due to diverticulitis, we will check labs as a precaution.  Also check UA and urine culture.  Will give pocket prescription for Augmentin with instructions to not start unless symptoms fail to improve over the next several days or if she has any worsening symptoms.  We discussed reasons to return to care and seek emergent care.  Vaginal bleeding Not discussed with patient today due to more pressing concerns however noted on chart review that it does not look like she has followed up with GYN due to her recent hospitalizations.  We referred her a couple of months ago.  We will refer again today.   Chronic Problems Addressed Today: Essential hypertension At goal today without meds.  Bipolar affective disorder [facility]) Follows with psychiatry for this.  They have been adjusting her medications.  Overall feels like her symptoms are stable and she feels like her medication regimen is working out currently.  She is on Depakote 500 mg twice daily, Invega 6 mg daily, Cogentin 0.5 mg daily, Seroquel 100 mg twice daily, trazodone 300 mg nightly, and Restoril 30 mg nightly     Subjective:  HPI:  See A/P for status of chronic conditions.  Patient is here today with 5 days of diarrhea, abdominal pain, and fever. No nausea or vomiting. No dysuria. No hematuria. Appetite has been normal. Pain located in lower abdomen. Pain has been stable the last few days. No back pain. She has not noticed anything that makes pain better or worse. She is having some congestion. No cough. No sick contacts.  She has not had any fever for several days.  She was admitted a few months ago with diverticulitis and C. difficile.  Current symptoms are not  consistent with her previous flare.  Since our last visit she has also been following with psychiatry.  She was at an inpatient psychiatric facility for a couple of weeks.  They have been adjusting her psychiatric medications.  She feels like she is on a good dose of current medications.       Objective:  Physical Exam: BP 126/77   Pulse 98   Temp (!) 97.3 F (36.3 C) (Temporal)   Ht 5\' 5"  (1.651 m)   Wt 188 lb 9.6 oz (85.5 kg)   LMP 02/12/2016   SpO2 95%   BMI 31.38 kg/m   Gen: No acute distress, resting comfortably CV: Regular rate and rhythm with no murmurs appreciated Pulm: Normal work of breathing, clear to auscultation bilaterally with no crackles, wheezes, or rhonchi GI: Bowel sounds present, soft, nontender, nondistended. Neuro: Grossly normal, moves all extremities Psych: Normal affect and thought content      Danielle Cox M. [name], MD 11/14/2022 10:46 AM

## 2022-11-14 NOTE — Telephone Encounter (Signed)
Danielle Cox called and states in pts chart they have allergic to Penicillin but there is an RX for penicillin that was sent in. Please advise.   [phone]

## 2022-11-14 NOTE — Assessment & Plan Note (Signed)
At goal today without meds. 

## 2022-11-14 NOTE — Assessment & Plan Note (Signed)
Follows with psychiatry for this.  They have been adjusting her medications.  Overall feels like her symptoms are stable and she feels like her medication regimen is working out currently.  She is on Depakote 500 mg twice daily, Invega 6 mg daily, Cogentin 0.5 mg daily, Seroquel 100 mg twice daily, trazodone 300 mg nightly, and Restoril 30 mg nightly

## 2022-11-15 LAB — URINE CULTURE
MICRO NUMBER:: 15664039
Result:: NO GROWTH
SPECIMEN QUALITY:: ADEQUATE

## 2022-11-15 NOTE — Telephone Encounter (Signed)
She was recently given Augmentin during hospitalization couple months ago and did fine with it.

## 2022-11-15 NOTE — Telephone Encounter (Signed)
Please advise 

## 2022-11-16 NOTE — Telephone Encounter (Signed)
Called [facility] spoke to [place] told her Dr. [name] said She was recently given Augmentin during hospitalization couple months ago and did fine with it.  Alona verbalized understanding.

## 2022-11-16 NOTE — Progress Notes (Signed)
She has a lot of blood in her urine.  No signs of infection.  It is possible that she may have had a kidney stone in the past.  Recommend that she come back in 1 to 2 weeks to recheck urinalysis.

## 2022-11-21 ENCOUNTER — Other Ambulatory Visit: Payer: Self-pay | Admitting: *Deleted

## 2022-11-21 ENCOUNTER — Other Ambulatory Visit: Payer: Self-pay | Admitting: Family Medicine

## 2022-11-21 DIAGNOSIS — R319 Hematuria, unspecified: Secondary | ICD-10-CM

## 2022-11-28 ENCOUNTER — Other Ambulatory Visit (INDEPENDENT_AMBULATORY_CARE_PROVIDER_SITE_OTHER): Payer: Medicare PPO

## 2022-11-28 DIAGNOSIS — R319 Hematuria, unspecified: Secondary | ICD-10-CM | POA: Diagnosis not present

## 2022-11-28 LAB — URINALYSIS, ROUTINE W REFLEX MICROSCOPIC
Bilirubin Urine: NEGATIVE
Hgb urine dipstick: NEGATIVE
Ketones, ur: NEGATIVE
Leukocytes,Ua: NEGATIVE
Nitrite: NEGATIVE
RBC / HPF: NONE SEEN (ref 0–?)
Specific Gravity, Urine: 1.01 (ref 1.000–1.030)
Total Protein, Urine: NEGATIVE
Urine Glucose: NEGATIVE
Urobilinogen, UA: 0.2 (ref 0.0–1.0)
pH: 6 (ref 5.0–8.0)

## 2022-12-03 NOTE — Progress Notes (Signed)
She no longer has any blood in her urine.  It is possible that her symptoms from last month may have been due to a kidney stone that passed.  We do not need to recheck any urine samples at this point however she should let us know if the abdominal pain is not improving or if she has had any recurrences.

## 2022-12-04 ENCOUNTER — Ambulatory Visit: Payer: Medicare PPO | Admitting: Family

## 2022-12-04 VITALS — Temp 97.5°F | Ht 65.0 in | Wt 186.0 lb

## 2022-12-04 DIAGNOSIS — R6889 Other general symptoms and signs: Secondary | ICD-10-CM | POA: Diagnosis not present

## 2022-12-04 LAB — POCT INFLUENZA A/B
Influenza A, POC: NEGATIVE
Influenza B, POC: NEGATIVE

## 2022-12-04 LAB — POC COVID19 BINAXNOW: SARS Coronavirus 2 Ag: NEGATIVE

## 2022-12-04 NOTE — Progress Notes (Signed)
Patient ID: Danielle Cox, female    DOB: July 02, 1960, 62 y.o.   MRN: [id number]  Chief Complaint  Patient presents with   Sinus Problem    Pt c/o headache, chills, nasal congestion, sore throat, slight cough , present for 2 days. Has tried tylenol. Exposed to flu.    Discussed the use of AI scribe software for clinical note transcription with the patient, who gave verbal consent to proceed.  History of Present Illness   The patient presents with a sinus , fever, and headache. She also reports a sore throat and chills. Her husband tested positive for flu yesterday. She has not checked her temperature but reports feeling warm. She is currently taking Tylenol for her symptoms but has not taken any cold or flu medications. She has Sudafed at home but has not tried it yet. Her symptoms are mainly in her head, with mucus and a postnasal drip. She also reports some soreness in her glands. She has not been coughing much.     Assessment & Plan:     Upper Respiratory Symptoms - Rapid flu & covid testing negative. Having symptoms for 2 days of cough, fever, sore throat, chills, and severe headache.  No chest involvement. Throat appears red but not severe. -Take Sudafed (has at home) in the am and after lunch if it doesn't cause jitteriness or increase blood pressure. -Take 1-2 Aleve twice a day for the next few days to help with pressure, sore throat, fever, and body aches. -Recommend nasal saline spray tid for disinfecting, moisturizing. Use humidifier overnight. -Increase water intake to 2L daily. -Call office next week if sx are not improved.   Subjective:    Outpatient Medications Prior to Visit  Medication Sig Dispense Refill   atorvastatin (LIPITOR) 20 MG tablet TAKE 1 TABLET BY MOUTH DAILY 90 tablet 1   benztropine (COGENTIN) 0.5 MG tablet Take 0.5 mg by mouth daily.     diclofenac (VOLTAREN) 75 MG EC tablet Take 1 tablet (75 mg total) by mouth 2 (two) times daily. 30 tablet 0    divalproex (DEPAKOTE) 500 MG DR tablet Take 500 mg by mouth 2 (two) times daily.     nicotine (NICODERM CQ - DOSED IN MG/24 HOURS) 21 mg/24hr patch Place 1 patch (21 mg total) onto the skin daily. 28 patch 0   ondansetron (ZOFRAN-ODT) 4 MG disintegrating tablet Take 1 tablet (4 mg total) by mouth every 8 (eight) hours as needed for nausea or vomiting. 20 tablet 0   paliperidone (INVEGA) 6 MG 24 hr tablet Take 6 mg by mouth at bedtime.     potassium chloride SA (KLOR-CON M) 20 MEQ tablet Take 2 tablets (40 mEq total) by mouth 2 (two) times daily. 20 tablet 0   QUEtiapine (SEROQUEL) 100 MG tablet Take 100 mg by mouth 2 (two) times daily.     temazepam (RESTORIL) 15 MG capsule Take 30 mg by mouth at bedtime as needed for sleep.     traZODone (DESYREL) 150 MG tablet Take 300 mg by mouth at bedtime.     amoxicillin-clavulanate (AUGMENTIN) 875-125 MG tablet Take 1 tablet by mouth 2 (two) times daily. (Patient not taking: Reported on 12/04/2022) 20 tablet 0   famotidine (PEPCID) 40 MG tablet Take 1 tablet (40 mg total) by mouth every evening. 30 tablet 1   No facility-administered medications prior to visit.   Past Medical History:  Diagnosis Date   Adenomatous polyp of ascending colon 05/02/2020   Anxiety  Bipolar affective disorder (HCC) 05/17/2019   Bipolar disorder (HCC)    Bronchitis, chronic (HCC)    COPD (chronic obstructive pulmonary disease) (HCC)    Depression    Essential hypertension 02/17/2009   Fibromyalgia 11/21/2017   Gastropathy 08/26/2022   History of Clostridioides difficile colitis 08/26/2022   Insulin resistance 08/24/2018   Personality disorder (HCC)    Past Surgical History:  Procedure Laterality Date   CARPAL TUNNEL RELEASE Right    CESAREAN SECTION  1983, 1988, 65   TONSILLECTOMY  age 50   Allergies  Allergen Reactions   Moxifloxacin Hcl Palpitations    REACTION: tackycardia   Corticosteroids Other (See Comments)    Mania      Objective:    Physical  Exam Vitals and nursing note reviewed.  Constitutional:      Appearance: Normal appearance. She is ill-appearing.     Interventions: Face mask in place.  HENT:     Right Ear: Tympanic membrane and ear canal normal.     Left Ear: Tympanic membrane and ear canal normal.     Nose:     Right Sinus: No frontal sinus tenderness.     Left Sinus: No frontal sinus tenderness.     Mouth/Throat:     Mouth: Mucous membranes are moist.     Pharynx: No pharyngeal swelling, oropharyngeal exudate, posterior oropharyngeal erythema or uvula swelling.     Tonsils: No tonsillar exudate or tonsillar abscesses.  Cardiovascular:     Rate and Rhythm: Normal rate and regular rhythm.  Pulmonary:     Effort: Pulmonary effort is normal.     Breath sounds: Normal breath sounds.  Musculoskeletal:        General: Normal range of motion.  Lymphadenopathy:     Head:     Right side of head: No preauricular or posterior auricular adenopathy.     Left side of head: No preauricular or posterior auricular adenopathy.     Cervical: No cervical adenopathy.  Skin:    General: Skin is warm and dry.  Neurological:     Mental Status: She is alert.  Psychiatric:        Mood and Affect: Mood normal.        Behavior: Behavior normal.    Temp (!) 97.5 F (36.4 C) (Temporal)   Ht 5\' 5"  (1.651 m)   Wt 186 lb (84.4 kg)   LMP 02/12/2016   BMI 30.95 kg/m  Wt Readings from Last 3 Encounters:  12/04/22 186 lb (84.4 kg)  11/14/22 188 lb 9.6 oz (85.5 kg)  09/07/22 180 lb (81.6 kg)      Dulce Sellar, NP

## 2022-12-25 ENCOUNTER — Encounter: Payer: Self-pay | Admitting: Family

## 2022-12-25 ENCOUNTER — Ambulatory Visit: Payer: Medicare PPO | Admitting: Family

## 2022-12-25 VITALS — BP 128/84 | HR 100 | Temp 97.8°F | Ht 65.0 in | Wt 203.8 lb

## 2022-12-25 DIAGNOSIS — U071 COVID-19: Secondary | ICD-10-CM

## 2022-12-25 LAB — POCT INFLUENZA A/B
Influenza A, POC: NEGATIVE
Influenza B, POC: NEGATIVE

## 2022-12-25 LAB — POC COVID19 BINAXNOW: SARS Coronavirus 2 Ag: POSITIVE — AB

## 2022-12-25 MED ORDER — NIRMATRELVIR/RITONAVIR (PAXLOVID)TABLET: 3.0000 | ORAL_TABLET | Freq: Two times a day (BID) | ORAL | 0 refills | Status: AC

## 2022-12-25 MED ORDER — ATORVASTATIN CALCIUM 20 MG PO TABS: 20.0000 mg | ORAL_TABLET | Freq: Every day | ORAL | Status: DC

## 2022-12-25 NOTE — Progress Notes (Signed)
Patient ID: Danielle Cox, female    DOB: Sep 26, 1960, 62 y.o.   MRN: [id number]  Chief Complaint  Patient presents with   Cough    Slight cough    Nasal Congestion    Pt states that her husband tested positive for COVID yesterday. Pt sxs' started for a week. Nasal congestion, body aches, fever of 101 yesterday, has tried Tylenol    Discussed the use of AI scribe software for clinical note transcription with the patient, who gave verbal consent to proceed.  History of Present Illness   The patient, with a history of smoking, presents with a 5d history of COVID-19 symptoms, including a severe headache, muscle aches, and foot pain. She reports that her symptoms have not improved over the past week, and her husband tested positive for Covid yesterday. She has not previously tested positive for COVID-19 or taken Paxlovid. She has been experiencing some reflux but is not on daily medication for it. She has been using saline spray for symptom relief. She also reports ear pain and has not been using over-the-counter ear drops or mineral oil. She denies coughing and describes her symptoms as mostly head congestion. She is currently taking atorvastatin but denies taking diclofenac, nicotine patches, trazodone, Restoril, or Depakote.    Assessment & Plan:     COVID-19 - Rapid flu negative. Positive Covid19 test with symptoms of headache, muscle aches, and foot pain for the past week. Discussed the possibility of Paxlovid being ineffective d/t number of days with sx. also possible side effects of Paxlovid, including possible nausea, diarrhea, and loss of taste or smell, pt choosing to start medication. -Start Paxlovid, three capsules twice a day for five days after eating. -Hold atorvastatin while on Paxlovid and resume after completion. -Consider over-the-counter generic Sudafed for congestion relief. -Continue nasal saline spray tid for disinfecting and moisture. -RTO precautions  provided.  Medication Review Discussed current medications and clarified that patient is no longer taking diclofenac, nicotine patches, trazodone, Restoril, and Depakote. Pt started on new Psych meds, entered in EMR. -Continue current medications as prescribed.     Subjective:    Outpatient Medications Prior to Visit  Medication Sig Dispense Refill   benztropine (COGENTIN) 0.5 MG tablet Take 0.5 mg by mouth daily.     clonazePAM (KLONOPIN) 1 MG tablet Take by mouth.     divalproex (DEPAKOTE) 500 MG DR tablet Take 500 mg by mouth 2 (two) times daily.     mirtazapine (REMERON) 15 MG tablet SMARTSIG:1.0 Tablet(s) By Mouth Every Night     OLANZapine (ZYPREXA) 10 MG tablet Take 10 mg by mouth at bedtime.     Oxcarbazepine (TRILEPTAL) 300 MG tablet 300 mg.     paliperidone (INVEGA) 6 MG 24 hr tablet Take 6 mg by mouth at bedtime.     sertraline (ZOLOFT) 50 MG tablet Take 50 mg by mouth daily.     amoxicillin-clavulanate (AUGMENTIN) 875-125 MG tablet Take 1 tablet by mouth 2 (two) times daily. (Patient not taking: Reported on 12/25/2022) 20 tablet 0   atorvastatin (LIPITOR) 20 MG tablet TAKE 1 TABLET BY MOUTH DAILY (Patient not taking: Reported on 12/25/2022) 90 tablet 1   diclofenac (VOLTAREN) 75 MG EC tablet Take 1 tablet (75 mg total) by mouth 2 (two) times daily. (Patient not taking: Reported on 12/25/2022) 30 tablet 0   famotidine (PEPCID) 40 MG tablet Take 1 tablet (40 mg total) by mouth every evening. 30 tablet 1   nicotine (NICODERM CQ -  DOSED IN MG/24 HOURS) 21 mg/24hr patch Place 1 patch (21 mg total) onto the skin daily. (Patient not taking: Reported on 12/25/2022) 28 patch 0   ondansetron (ZOFRAN-ODT) 4 MG disintegrating tablet Take 1 tablet (4 mg total) by mouth every 8 (eight) hours as needed for nausea or vomiting. (Patient not taking: Reported on 12/25/2022) 20 tablet 0   potassium chloride SA (KLOR-CON M) 20 MEQ tablet Take 2 tablets (40 mEq total) by mouth 2 (two) times daily.  (Patient not taking: Reported on 12/25/2022) 20 tablet 0   QUEtiapine (SEROQUEL) 100 MG tablet Take 100 mg by mouth 2 (two) times daily. (Patient not taking: Reported on 12/25/2022)     temazepam (RESTORIL) 15 MG capsule Take 30 mg by mouth at bedtime as needed for sleep. (Patient not taking: Reported on 12/25/2022)     traZODone (DESYREL) 150 MG tablet Take 300 mg by mouth at bedtime. (Patient not taking: Reported on 12/25/2022)     No facility-administered medications prior to visit.   Past Medical History:  Diagnosis Date   Adenomatous polyp of ascending colon 05/02/2020   Anxiety    Bipolar affective disorder (HCC) 05/17/2019   Bipolar disorder (HCC)    Bronchitis, chronic (HCC)    COPD (chronic obstructive pulmonary disease) (HCC)    Depression    Essential hypertension 02/17/2009   Fibromyalgia 11/21/2017   Gastropathy 08/26/2022   History of Clostridioides difficile colitis 08/26/2022   Insulin resistance 08/24/2018   Personality disorder (HCC)    Past Surgical History:  Procedure Laterality Date   CARPAL TUNNEL RELEASE Right    CESAREAN SECTION  1983, 1988, 51   TONSILLECTOMY  age 84   Allergies  Allergen Reactions   Moxifloxacin Hcl Palpitations    REACTION: tackycardia   Corticosteroids Other (See Comments)    Mania      Objective:    Physical Exam Vitals and nursing note reviewed.  Constitutional:      Appearance: Normal appearance. She is ill-appearing.     Interventions: Face mask in place.  HENT:     Right Ear: Tympanic membrane and ear canal normal.     Left Ear: Tympanic membrane and ear canal normal.     Nose:     Right Sinus: Frontal sinus tenderness present.     Left Sinus: Frontal sinus tenderness present.     Mouth/Throat:     Mouth: Mucous membranes are moist.     Pharynx: Posterior oropharyngeal erythema present. No pharyngeal swelling, oropharyngeal exudate or uvula swelling.     Tonsils: No tonsillar exudate or tonsillar abscesses.   Cardiovascular:     Rate and Rhythm: Normal rate and regular rhythm.  Pulmonary:     Effort: Pulmonary effort is normal.     Breath sounds: Normal breath sounds.  Musculoskeletal:        General: Normal range of motion.  Lymphadenopathy:     Head:     Right side of head: No preauricular or posterior auricular adenopathy.     Left side of head: No preauricular or posterior auricular adenopathy.     Cervical: No cervical adenopathy.  Skin:    General: Skin is warm and dry.  Neurological:     Mental Status: She is alert.  Psychiatric:        Mood and Affect: Mood normal.        Behavior: Behavior normal.    BP 128/84   Pulse 100   Temp 97.8 F (36.6 C)  Ht 5\' 5"  (1.651 m)   Wt 203 lb 12.8 oz (92.4 kg)   LMP 02/12/2016   SpO2 94%   BMI 33.91 kg/m  Wt Readings from Last 3 Encounters:  12/25/22 203 lb 12.8 oz (92.4 kg)  12/04/22 186 lb (84.4 kg)  11/14/22 188 lb 9.6 oz (85.5 kg)      Dulce Sellar, NP

## 2022-12-31 ENCOUNTER — Encounter: Payer: Self-pay | Admitting: Family

## 2022-12-31 ENCOUNTER — Telehealth: Payer: Medicare PPO | Admitting: Family

## 2022-12-31 VITALS — Temp 99.1°F

## 2022-12-31 DIAGNOSIS — J0110 Acute frontal sinusitis, unspecified: Secondary | ICD-10-CM

## 2022-12-31 MED ORDER — AMOXICILLIN-POT CLAVULANATE 875-125 MG PO TABS: 1.0000 | ORAL_TABLET | Freq: Two times a day (BID) | ORAL | 0 refills | Status: DC

## 2022-12-31 NOTE — Progress Notes (Signed)
MyChart Video Visit    Virtual Visit via Video Note   This format is felt to be most appropriate for this patient at this time. Physical exam was limited by quality of the video and audio technology used for the visit. CMA was able to get the patient set up on a video visit.  Patient location: Home. Patient and provider in visit Provider location: Office  I discussed the limitations of evaluation and management by telemedicine and the availability of in person appointments. The patient expressed understanding and agreed to proceed.  Visit Date: 12/31/2022  Today's healthcare provider: Dulce Sellar, NP     Subjective:   Patient ID: Danielle Cox, female    DOB: [birth year], 62 y.o.   MRN: [id number]  Chief Complaint  Patient presents with   Sinus Problem    sx for a week   HPI: Sinus sx:  Pt c/o diarrhea, facial pressure, cough, headaches, body aches and green nasal drainage. Present since last week. Dx covid on 12/10, pt finished paxlovid, but had sx already for 5d at that time. Has also tried sudafed which did not help sx.   Assessment & Plan:  Acute non-recurrent frontal sinusitis - sending Augmentin, advised on use & SE, continue to use saline nasal spray tid and can take OTC antihistamine daily until sx are improved. Stop using Sudafed. Drink plenty of water. RTO precautions provided.  -     Amoxicillin-Pot Clavulanate; Take 1 tablet by mouth 2 (two) times daily after a meal.  Dispense: 10 tablet; Refill: 0   Past Medical History:  Diagnosis Date   Adenomatous polyp of ascending colon 05/02/2020   Anxiety    Bipolar affective disorder (HCC) 05/17/2019   Bipolar disorder (HCC)    Bronchitis, chronic (HCC)    COPD (chronic obstructive pulmonary disease) (HCC)    Depression    Essential hypertension 02/17/2009   Fibromyalgia 11/21/2017   Gastropathy 08/26/2022   History of Clostridioides difficile colitis 08/26/2022   Insulin resistance 08/24/2018    Personality disorder (HCC)     Past Surgical History:  Procedure Laterality Date   CARPAL TUNNEL RELEASE Right    CESAREAN SECTION  1983, 1988, 54   TONSILLECTOMY  age 58    Outpatient Medications Prior to Visit  Medication Sig Dispense Refill   atorvastatin (LIPITOR) 20 MG tablet Take 1 tablet (20 mg total) by mouth daily.     benztropine (COGENTIN) 1 MG tablet SMARTSIG:1.0 Tablet(s) By Mouth Twice Daily     clonazePAM (KLONOPIN) 1 MG tablet Take by mouth.     mirtazapine (REMERON) 15 MG tablet SMARTSIG:1.0 Tablet(s) By Mouth Every Night     OLANZapine (ZYPREXA) 10 MG tablet Take 10 mg by mouth at bedtime.     Oxcarbazepine (TRILEPTAL) 300 MG tablet 300 mg.     sertraline (ZOLOFT) 50 MG tablet Take 50 mg by mouth daily.     famotidine (PEPCID) 40 MG tablet Take 1 tablet (40 mg total) by mouth every evening. 30 tablet 1   No facility-administered medications prior to visit.    Allergies  Allergen Reactions   Moxifloxacin Hcl Palpitations    REACTION: tackycardia   Corticosteroids Other (See Comments)    Mania      Objective:   Physical Exam Vitals and nursing note reviewed.  Constitutional:      General: Pt is not in acute distress.    Appearance: Normal appearance.  HENT:     Head: Normocephalic.  Pulmonary:  Effort: No respiratory distress.  Musculoskeletal:     Cervical back: Normal range of motion.  Skin:    General: Skin is dry.     Coloration: Skin is not pale.  Neurological:     Mental Status: Pt is alert and oriented to person, place, and time.  Psychiatric:        Mood and Affect: Mood normal.   Temp 99.1 F (37.3 C) (Oral)   LMP 02/12/2016   Wt Readings from Last 3 Encounters:  12/25/22 203 lb 12.8 oz (92.4 kg)  12/04/22 186 lb (84.4 kg)  11/14/22 188 lb 9.6 oz (85.5 kg)      I discussed the assessment and treatment plan with the patient. The patient was provided an opportunity to ask questions and all were answered. The patient agreed  with the plan and demonstrated an understanding of the instructions.   The patient was advised to call back or seek an in-person evaluation if the symptoms worsen or if the condition fails to improve as anticipated.  Dulce Sellar, NP [facility] [phone] (phone) [phone] (fax)  [facility]

## 2023-01-01 ENCOUNTER — Telehealth: Payer: Self-pay | Admitting: Family Medicine

## 2023-01-01 DIAGNOSIS — F17200 Nicotine dependence, unspecified, uncomplicated: Secondary | ICD-10-CM | POA: Diagnosis not present

## 2023-01-01 DIAGNOSIS — F419 Anxiety disorder, unspecified: Secondary | ICD-10-CM | POA: Diagnosis not present

## 2023-01-01 DIAGNOSIS — F3160 Bipolar disorder, current episode mixed, unspecified: Secondary | ICD-10-CM | POA: Diagnosis not present

## 2023-01-01 NOTE — Telephone Encounter (Signed)
Can you inform pt of this, in case she were to have clinical questions?

## 2023-01-01 NOTE — Telephone Encounter (Signed)
Patient was prescribed augmentin following VV with [facility] yesterday. States pharmacy informed her this is a form of penicillin and she is allergic to penicillin. Patient requests another med be sent in.

## 2023-01-02 ENCOUNTER — Ambulatory Visit: Payer: Medicare PPO | Admitting: Family

## 2023-01-02 ENCOUNTER — Telehealth: Payer: Self-pay

## 2023-01-02 NOTE — Telephone Encounter (Signed)
I called pt in regards to appointment, Pt states she would like to be tested for flu. I let pt know Judeth Cornfield recommends she finish abx that were sent in on 12/16. I advised pt if sx worsen after finishing abx to call the office and schedule an appointment with PCP. Pt tested positive for Covid on 12/10 and Negative for flu on 12/10.

## 2023-01-03 ENCOUNTER — Ambulatory Visit: Payer: Medicare PPO | Admitting: Family

## 2023-01-07 ENCOUNTER — Telehealth (INDEPENDENT_AMBULATORY_CARE_PROVIDER_SITE_OTHER): Payer: Medicare PPO | Admitting: Family Medicine

## 2023-01-07 ENCOUNTER — Encounter: Payer: Self-pay | Admitting: Family Medicine

## 2023-01-07 DIAGNOSIS — H9203 Otalgia, bilateral: Secondary | ICD-10-CM

## 2023-01-07 DIAGNOSIS — J0110 Acute frontal sinusitis, unspecified: Secondary | ICD-10-CM | POA: Diagnosis not present

## 2023-01-07 MED ORDER — NEOMYCIN-POLYMYXIN-HC 3.5-10000-1 OT SOLN: 3.0000 [drp] | Freq: Three times a day (TID) | OTIC | 0 refills | Status: AC

## 2023-01-07 NOTE — Progress Notes (Unsigned)
Virtual Visit via Video Note  I connected with Danielle Cox on 01/08/23 at  3:40 PM EST by a video enabled telemedicine application and verified that I am speaking with the correct person using two identifiers.  Patient Location: Home Provider Location: Other:  Office  I discussed the limitations, risks, security, and privacy concerns of performing an evaluation and management service by video and the availability of in person appointments. I also discussed with the patient that there may be a patient responsible charge related to this service. The patient expressed understanding and agreed to proceed.  Subjective: PCP: [name], MD  No chief complaint on file.  HPI 62 year old female presents for evaluation of bilateral ear pain, recent sinusitis. Was also diagnosed with COVID on 12/25/2022. She was seen by her primary care and was prescribed 5 days of Augmentin.  States that this was not helpful with her sinus infection or with ear pain. Denies abdominal pain, nausea, vomiting, diarrhea, rash, fever, chills, other symptoms. Medical history as outlined below.  ROS: Per HPI  Current Outpatient Medications:    neomycin-polymyxin-hydrocortisone (CORTISPORIN) OTIC solution, Place 3 drops into both ears 3 (three) times daily for 3 days., Disp: 30 mL, Rfl: 0   amoxicillin-clavulanate (AUGMENTIN) 875-125 MG tablet, Take 1 tablet by mouth 2 (two) times daily after a meal., Disp: 10 tablet, Rfl: 0   atorvastatin (LIPITOR) 20 MG tablet, Take 1 tablet (20 mg total) by mouth daily., Disp: , Rfl:    benztropine (COGENTIN) 1 MG tablet, SMARTSIG:1.0 Tablet(s) By Mouth Twice Daily, Disp: , Rfl:    clonazePAM (KLONOPIN) 1 MG tablet, Take by mouth., Disp: , Rfl:    famotidine (PEPCID) 40 MG tablet, Take 1 tablet (40 mg total) by mouth every evening., Disp: 30 tablet, Rfl: 1   mirtazapine (REMERON) 15 MG tablet, SMARTSIG:1.0 Tablet(s) By Mouth Every Night, Disp: , Rfl:    OLANZapine  (ZYPREXA) 10 MG tablet, Take 10 mg by mouth at bedtime., Disp: , Rfl:    Oxcarbazepine (TRILEPTAL) 300 MG tablet, 300 mg., Disp: , Rfl:    sertraline (ZOLOFT) 50 MG tablet, Take 50 mg by mouth daily., Disp: , Rfl:   Observations/Objective: There were no vitals filed for this visit. Physical Exam Constitutional:      General: She is not in acute distress.    Appearance: Normal appearance. She is ill-appearing.  HENT:     Head: Normocephalic and atraumatic.  Eyes:     Extraocular Movements: Extraocular movements intact.  Pulmonary:     Effort: Pulmonary effort is normal. No respiratory distress.  Musculoskeletal:     Cervical back: Normal range of motion.  Neurological:     Mental Status: She is alert and oriented to person, place, and time.  Psychiatric:        Mood and Affect: Mood normal.        Thought Content: Thought content normal.     Assessment and Plan: Acute non-recurrent frontal sinusitis -     Neomycin-Polymyxin-HC; Place 3 drops into both ears 3 (three) times daily for 3 days.  Dispense: 30 mL; Refill: 0 -     Amoxicillin-Pot Clavulanate; Take 1 tablet by mouth 2 (two) times daily after a meal.  Dispense: 10 tablet; Refill: 0  Ear pain, bilateral -     Neomycin-Polymyxin-HC; Place 3 drops into both ears 3 (three) times daily for 3 days.  Dispense: 30 mL; Refill: 0  Will add another 5 days of abx to cover for  otitis media and complete treatment for sinusitis  Cortisporin to help with ear pain relief.  Follow Up Instructions: Return if symptoms worsen or fail to improve.   I discussed the assessment and treatment plan with the patient. The patient was provided an opportunity to ask questions, and all were answered. The patient agreed with the plan and demonstrated an understanding of the instructions.   The patient was advised to call back or seek an in-person evaluation if the symptoms worsen or if the condition fails to improve as anticipated.  The above  assessment and management plan was discussed with the patient. The patient verbalized understanding of and has agreed to the management plan.   Moshe Cipro, FNP

## 2023-01-07 NOTE — Patient Instructions (Addendum)
I have sent in Augmentin for you to take twice a day for another 5 days. This medication can upset your stomach, so I tell everyone to take it with a meal.

## 2023-01-08 MED ORDER — AMOXICILLIN-POT CLAVULANATE 875-125 MG PO TABS: 1.0000 | ORAL_TABLET | Freq: Two times a day (BID) | ORAL | 0 refills | Status: DC

## 2023-01-21 VITALS — Wt 203.0 lb

## 2023-01-21 NOTE — Progress Notes (Signed)
 This encounter was created in error - please disregard.

## 2023-01-21 NOTE — Patient Instructions (Signed)
 Marland Kitchen

## 2023-02-01 DIAGNOSIS — F3160 Bipolar disorder, current episode mixed, unspecified: Secondary | ICD-10-CM | POA: Diagnosis not present

## 2023-02-01 DIAGNOSIS — G4700 Insomnia, unspecified: Secondary | ICD-10-CM | POA: Diagnosis not present

## 2023-02-01 DIAGNOSIS — F419 Anxiety disorder, unspecified: Secondary | ICD-10-CM | POA: Diagnosis not present

## 2023-02-01 DIAGNOSIS — F17200 Nicotine dependence, unspecified, uncomplicated: Secondary | ICD-10-CM | POA: Diagnosis not present

## 2023-03-13 DIAGNOSIS — F3160 Bipolar disorder, current episode mixed, unspecified: Secondary | ICD-10-CM | POA: Diagnosis not present

## 2023-03-13 DIAGNOSIS — F17200 Nicotine dependence, unspecified, uncomplicated: Secondary | ICD-10-CM | POA: Diagnosis not present

## 2023-03-13 DIAGNOSIS — F419 Anxiety disorder, unspecified: Secondary | ICD-10-CM | POA: Diagnosis not present

## 2023-04-04 DIAGNOSIS — F419 Anxiety disorder, unspecified: Secondary | ICD-10-CM | POA: Diagnosis not present

## 2023-04-04 DIAGNOSIS — F17200 Nicotine dependence, unspecified, uncomplicated: Secondary | ICD-10-CM | POA: Diagnosis not present

## 2023-04-04 DIAGNOSIS — F3160 Bipolar disorder, current episode mixed, unspecified: Secondary | ICD-10-CM | POA: Diagnosis not present

## 2023-04-18 ENCOUNTER — Ambulatory Visit: Admitting: Family Medicine

## 2023-04-18 ENCOUNTER — Encounter: Payer: Self-pay | Admitting: Family Medicine

## 2023-04-18 VITALS — BP 135/84 | HR 94 | Temp 98.4°F | Ht 65.0 in | Wt 204.6 lb

## 2023-04-18 DIAGNOSIS — I10 Essential (primary) hypertension: Secondary | ICD-10-CM

## 2023-04-18 DIAGNOSIS — E785 Hyperlipidemia, unspecified: Secondary | ICD-10-CM | POA: Diagnosis not present

## 2023-04-18 DIAGNOSIS — N390 Urinary tract infection, site not specified: Secondary | ICD-10-CM

## 2023-04-18 MED ORDER — CEPHALEXIN 500 MG PO CAPS: 500.0000 mg | ORAL_CAPSULE | Freq: Two times a day (BID) | ORAL | 0 refills | Status: AC

## 2023-04-18 NOTE — Assessment & Plan Note (Signed)
At goal today without meds. 

## 2023-04-18 NOTE — Progress Notes (Signed)
   Danielle Cox is a 63 y.o. female who presents today for an office visit.  Assessment/Plan:  New/Acute Problems: Urinary frequency No red flags.  History consistent with previous UTIs.  Will start Keflex 5 mg twice daily empirically while we await culture results.  Encouraged hydration.  We discussed reasons to return to care.  Chronic Problems Addressed Today: Essential hypertension At goal today without meds.  Dyslipidemia Stable on Lipitor 20 mg daily.  Recheck lipids at upcoming CPE.     Subjective:  HPI:  See A/p for status of chronic conditions. Patient here with concern for urinary tract infection. Started 2-3 days ago.  Symptoms include lower abdominal cramping increased frequency.  No dysuria.  No fevers or chills.  No back pain.  Symptoms are consistent with previous UTIs.       Objective:  Physical Exam: BP 135/84   Pulse 94   Temp 98.4 F (36.9 C) (Temporal)   Ht 5\' 5"  (1.651 m)   Wt 204 lb 9.6 oz (92.8 kg)   LMP 02/12/2016   SpO2 94%   BMI 34.05 kg/m   Gen: No acute distress, resting comfortably CV: Regular rate and rhythm with no murmurs appreciated Pulm: Normal work of breathing, clear to auscultation bilaterally with no crackles, wheezes, or rhonchi Neuro: Grossly normal, moves all extremities Psych: Normal affect and thought content      Conni Knighton M. [name], MD 04/18/2023 11:50 AM

## 2023-04-18 NOTE — Assessment & Plan Note (Signed)
 Stable on Lipitor 20 mg daily.  Recheck lipids at upcoming CPE.

## 2023-04-18 NOTE — Patient Instructions (Signed)
 It was very nice to see you today!  You probably have a urinary tract infection.  Please start the Keflex.  We will check a urine culture.  Will let you know if we need to make any changes with this.  Please let us know if your symptoms worsen or fail to improve over the next several days.  Return if symptoms worsen or fail to improve.   Take care, Dr [name]  PLEASE NOTE:  If you had any lab tests, please let us know if you have not heard back within a few days. You may see your results on mychart before we have a chance to review them but we will give you a call once they are reviewed by [place].   If we ordered any referrals today, please let us know if you have not heard from their office within the next week.   If you had any urgent prescriptions sent in today, please check with the pharmacy within an hour of our visit to make sure the prescription was transmitted appropriately.   Please try these tips to maintain a healthy lifestyle:  Eat at least 3 REAL meals and 1-2 snacks per day.  Aim for no more than 5 hours between eating.  If you eat breakfast, please do so within one hour of getting up.   Each meal should contain half fruits/vegetables, one quarter protein, and one quarter carbs (no bigger than a computer mouse)  Cut down on sweet beverages. This includes juice, soda, and sweet tea.   Drink at least 1 glass of water with each meal and aim for at least 8 glasses per day  Exercise at least 150 minutes every week.

## 2023-04-19 LAB — URINALYSIS, ROUTINE W REFLEX MICROSCOPIC
Bilirubin Urine: NEGATIVE
Ketones, ur: NEGATIVE
Leukocytes,Ua: NEGATIVE
Nitrite: NEGATIVE
Specific Gravity, Urine: 1.01 (ref 1.000–1.030)
Total Protein, Urine: NEGATIVE
Urine Glucose: NEGATIVE
Urobilinogen, UA: 0.2 (ref 0.0–1.0)
pH: 6.5 (ref 5.0–8.0)

## 2023-04-19 LAB — URINE CULTURE
MICRO NUMBER:: 16285325
Result:: NO GROWTH
SPECIMEN QUALITY:: ADEQUATE

## 2023-04-22 ENCOUNTER — Encounter: Payer: Self-pay | Admitting: Family Medicine

## 2023-04-22 NOTE — Progress Notes (Signed)
 Urine culture is negative.  Recommend she come back here for repeat urinalysis due to the blood in her urine.  She should let us know if her symptoms are not improving.  Please place future order for urinalysis.

## 2023-04-23 ENCOUNTER — Other Ambulatory Visit: Payer: Self-pay | Admitting: *Deleted

## 2023-04-23 DIAGNOSIS — R319 Hematuria, unspecified: Secondary | ICD-10-CM

## 2023-04-23 NOTE — Progress Notes (Signed)
 She had one previous UA that showed blood 5 months ago however the repeat 1 after this was negative.  Her hematuria is probably due to recurrent UTI however we can refer her to urology for further evaluation if she wishes.

## 2023-05-15 ENCOUNTER — Ambulatory Visit: Payer: Self-pay | Admitting: Family Medicine

## 2023-05-15 NOTE — Telephone Encounter (Signed)
 Chief Complaint: Left foot pain x2 months, getting worse  Symptoms: Pain in arch of left foot, pain after walking, 6/10 pain level constant Pertinent Negatives: Patient denies pain radiation, numbness  Disposition: [] ED /[] Urgent Care (no appt availability in office) / [x] Appointment(In office/virtual)/ []  [facility]/ [] Home Care/ [] Refused Recommended Disposition /[]  Mobile Bus/ []  Follow-up with PCP  Additional Notes: Patient scheduled for an appt with PCP tomorrow. This RN educated pt on new-worsening symptoms and when to call back/seek emergent care. Pt verbalized understanding and agrees to plan.     Copied from CRM [id number]. Topic: Appointments - Appointment Scheduling >> May 15, 2023  2:50 PM Alyse July wrote: Patient is having worsening symptoms but no red/pink word mention.patient is experiencing left foot pain that has progressively gotten worse over the last few months per patient. She is also now having difficulty walking and the arch in her foot hurts now as well. Reason for Disposition  [1] MODERATE pain (e.g., interferes with normal activities, limping) AND [2] present > 3 days  Answer Assessment - Initial Assessment Questions ONSET: "When did the pain start?"      2 months LOCATION: "Where is the pain located?"      Arch mainly, whole foot hurts PAIN: "How bad is the pain?"    (Scale 1-10; or mild, moderate, severe)     6/10  CAUSE: "What do you think is causing the foot pain?"     Not sure OTHER SYMPTOMS: "Do you have any other symptoms?" (e.g., leg pain, rash, fever, numbness) Denies  Protocols used: Foot Pain-A-AH

## 2023-05-16 ENCOUNTER — Encounter: Payer: Self-pay | Admitting: Family Medicine

## 2023-05-16 ENCOUNTER — Ambulatory Visit: Admitting: Family Medicine

## 2023-05-16 VITALS — BP 130/80 | HR 87 | Temp 97.9°F | Ht 65.0 in | Wt 210.0 lb

## 2023-05-16 DIAGNOSIS — R319 Hematuria, unspecified: Secondary | ICD-10-CM | POA: Diagnosis not present

## 2023-05-16 DIAGNOSIS — M79672 Pain in left foot: Secondary | ICD-10-CM

## 2023-05-16 DIAGNOSIS — I10 Essential (primary) hypertension: Secondary | ICD-10-CM | POA: Diagnosis not present

## 2023-05-16 DIAGNOSIS — E785 Hyperlipidemia, unspecified: Secondary | ICD-10-CM | POA: Diagnosis not present

## 2023-05-16 LAB — URINALYSIS, ROUTINE W REFLEX MICROSCOPIC
Bilirubin Urine: NEGATIVE
Hgb urine dipstick: NEGATIVE
Ketones, ur: NEGATIVE
Leukocytes,Ua: NEGATIVE
Nitrite: NEGATIVE
Specific Gravity, Urine: 1.02 (ref 1.000–1.030)
Total Protein, Urine: NEGATIVE
Urine Glucose: NEGATIVE
Urobilinogen, UA: 0.2 (ref 0.0–1.0)
pH: 7 (ref 5.0–8.0)

## 2023-05-16 NOTE — Patient Instructions (Signed)
 It was very nice to see you today!  You have plantar fasciitis.  Work on exercises.  Let us  know if not improving in a few weeks.  It is very important that you make sure that you get good arch support in your shoes as well.  Please try to limit the barefoot walking.  Let us  know if you would like referral to see sports medicine or physical therapy.  Will recheck a urine sample today.  Return if symptoms worsen or fail to improve.   Take care, Dr Daneil Dunker  PLEASE NOTE:  If you had any lab tests, please let us  know if you have not heard back within a few days. You may see your results on mychart before we have a chance to review them but we will give you a call once they are reviewed by us .   If we ordered any referrals today, please let us  know if you have not heard from their office within the next week.   If you had any urgent prescriptions sent in today, please check with the pharmacy within an hour of our visit to make sure the prescription was transmitted appropriately.   Please try these tips to maintain a healthy lifestyle:  Eat at least 3 REAL meals and 1-2 snacks per day.  Aim for no more than 5 hours between eating.  If you eat breakfast, please do so within one hour of getting up.   Each meal should contain half fruits/vegetables, one quarter protein, and one quarter carbs (no bigger than a computer mouse)  Cut down on sweet beverages. This includes juice, soda, and sweet tea.   Drink at least 1 glass of water  with each meal and aim for at least 8 glasses per day  Exercise at least 150 minutes every week.

## 2023-05-16 NOTE — Telephone Encounter (Signed)
 Appt today

## 2023-05-16 NOTE — Assessment & Plan Note (Signed)
 Tolerating Lipitor 10 mg daily well.Recheck lipids when she comes back in for CPE.

## 2023-05-16 NOTE — Assessment & Plan Note (Signed)
At goal today without medications. 

## 2023-05-16 NOTE — Progress Notes (Signed)
   Danielle Cox is a 63 y.o. female who presents today for an office visit.  Assessment/Plan:  New/Acute Problems: Left Foot Pain  History and exam consistent with plantar fasciitis.  We discussed conservative measures including appropriate footwear, arch support, and home exercises.  We did discuss referral to PT or sports medicine however she declined for now.  She will let us  know if not improving in the next 2 weeks and we can further if needed.  Hematuria  Will repeat UA today.  If he has persistent hematuria will need referral to urology.  Chronic Problems Addressed Today: Essential hypertension At goal today without medications.  Dyslipidemia Tolerating Lipitor 10 mg daily well.Recheck lipids when she comes back in for CPE.     Subjective:  HPI:  See Assessment / plan for status of chronic conditions.  Patient here today with left foot pain.  Started a few weeks ago.  No obvious injuries or precipitating events.  She has not tried any specific treatments for this.  Symptoms do seem to be worse with walking.  Symptoms are worsening in the last few days.  No other aggravating or alleviating factors.  I did see her about a month ago for UTI.  At that time we started her on Keflex .  UA showed hematuria.  We asked her to come back for repeat UA and she would like to have this completed today.       Objective:  Physical Exam: BP 130/80   Pulse 87   Temp 97.9 F (36.6 C) (Temporal)   Ht 5\' 5"  (1.651 m)   Wt 210 lb (95.3 kg)   LMP 02/12/2016   SpO2 97%   BMI 34.95 kg/m   Gen: No acute distress, resting comfortably MUSCULOSKELETAL - Left Foot: No deformities.  Tender to palpation along longitudinal arch.  Neurovascular intact distally. Neuro: Grossly normal, moves all extremities Psych: Normal affect and thought content      Shaquayla Klimas M. Daneil Dunker, MD 05/16/2023 8:43 AM

## 2023-05-21 ENCOUNTER — Encounter: Payer: Self-pay | Admitting: Family Medicine

## 2023-05-21 NOTE — Progress Notes (Signed)
 Urine sample does not show any signs of blood.  We can recheck again at next office visit.

## 2023-05-25 ENCOUNTER — Other Ambulatory Visit: Payer: Self-pay | Admitting: Family Medicine

## 2023-06-05 DIAGNOSIS — F3160 Bipolar disorder, current episode mixed, unspecified: Secondary | ICD-10-CM | POA: Diagnosis not present

## 2023-06-05 DIAGNOSIS — G4700 Insomnia, unspecified: Secondary | ICD-10-CM | POA: Diagnosis not present

## 2023-06-05 DIAGNOSIS — F419 Anxiety disorder, unspecified: Secondary | ICD-10-CM | POA: Diagnosis not present

## 2023-06-05 DIAGNOSIS — F17200 Nicotine dependence, unspecified, uncomplicated: Secondary | ICD-10-CM | POA: Diagnosis not present

## 2023-06-11 ENCOUNTER — Ambulatory Visit: Payer: Self-pay

## 2023-06-11 NOTE — Telephone Encounter (Signed)
 Copied from CRM [id number]. Topic: Clinical - Red Word Triage >> Jun 11, 2023 11:03 AM Baldomero Bone wrote: Red Word that prompted transfer to Nurse Triage: shortness of breath for a few weeks. callback number is [phone]  Per agent, call had dropped with pt, agent sent CRM over for call back from a nurse but no call back yet, agent called her back.   Chief Complaint: SOB Symptoms: SOB with exertion, intermittent wheezing, smoking more often, coughing some Frequency: continual Pertinent Negatives: Patient denies SOB at rest, chest pain, fever, trouble lying flat, coughing fits or coughing up sputum, dizziness, weakness, runny nose Disposition: [] 911 / [] ED /[] Urgent Care (no appt availability in office) / [] Appointment(In office/virtual)/ []  [facility]/ [] Home Care/ [x] Refused Recommended Disposition /[] [facility]/ []  Follow-up with PCP Additional Notes: Pt reporting that she has been experiencing worsening SOB for the past few weeks, wheezing intermittently, only SOB with exertion, coughing some, also been smoking more often which she acknowledges "know that's a big no-no." Pt confirms no chest pain, fever, coughing up sputum or coughing fits, other symptoms. Pt speaking in full sentences. Advised pt be examined in next 4 hours, offered appts with other providers but pt refusing and only wants to see PCP, advised sending HP message for call back with other appt options, advised call back if worsening. Pt verbalized understanding. Please advise.  Reason for Disposition  [1] MILD difficulty breathing (e.g., minimal/no SOB at rest, SOB with walking, pulse <100) AND [2] NEW-onset or WORSE than normal  Answer Assessment - Initial Assessment Questions 1. RESPIRATORY STATUS: "Describe your breathing?" (e.g., wheezing, shortness of breath, unable to speak, severe coughing)      SOB going up the stairs, just in general doing stuff, been smoking more too know that's a big no-no Moving  around, no SOB at rest Had me on symbicort  for long time and didn't help 2. ONSET: "When did this breathing problem begin?"      Past few weeks 3. PATTERN "Does the difficult breathing come and go, or has it been constant since it started?"      persistent 4. SEVERITY: "How bad is your breathing?" (e.g., mild, moderate, severe)    - MILD: No SOB at rest, mild SOB with walking, speaks normally in sentences, can lie down, no retractions, pulse < 100.    - MODERATE: SOB at rest, SOB with minimal exertion and prefers to sit, cannot lie down flat, speaks in phrases, mild retractions, audible wheezing, pulse 100-120.    - SEVERE: Very SOB at rest, speaks in single words, struggling to breathe, sitting hunched forward, retractions, pulse > 120      No trouble lying flat, coughing not a lot, wheezing intermittently 5. RECURRENT SYMPTOM: "Have you had difficulty breathing before?" If Yes, ask: "When was the last time?" and "What happened that time?"      Been months ago 6. CARDIAC HISTORY: "Do you have any history of heart disease?" (e.g., heart attack, angina, bypass surgery, angioplasty)      HTN 7. LUNG HISTORY: "Do you have any history of lung disease?"  (e.g., pulmonary embolus, asthma, emphysema)     COPD 9. OTHER SYMPTOMS: "Do you have any other symptoms? (e.g., dizziness, runny nose, cough, chest pain, fever)     Some coughing No inhaler  Protocols used: Breathing Difficulty-A-AH

## 2023-06-12 NOTE — Telephone Encounter (Signed)
 Appt tomorrow with Dr. Daneil Dunker.

## 2023-06-13 ENCOUNTER — Encounter: Payer: Self-pay | Admitting: Family Medicine

## 2023-06-13 ENCOUNTER — Ambulatory Visit: Admitting: Family Medicine

## 2023-06-13 VITALS — BP 125/55 | HR 72 | Temp 97.9°F | Ht 65.0 in | Wt 213.2 lb

## 2023-06-13 DIAGNOSIS — E88819 Insulin resistance, unspecified: Secondary | ICD-10-CM | POA: Diagnosis not present

## 2023-06-13 DIAGNOSIS — E785 Hyperlipidemia, unspecified: Secondary | ICD-10-CM

## 2023-06-13 DIAGNOSIS — J449 Chronic obstructive pulmonary disease, unspecified: Secondary | ICD-10-CM

## 2023-06-13 DIAGNOSIS — I10 Essential (primary) hypertension: Secondary | ICD-10-CM | POA: Diagnosis not present

## 2023-06-13 DIAGNOSIS — F17200 Nicotine dependence, unspecified, uncomplicated: Secondary | ICD-10-CM

## 2023-06-13 LAB — COMPREHENSIVE METABOLIC PANEL WITH GFR
ALT: 20 U/L (ref 0–35)
AST: 16 U/L (ref 0–37)
Albumin: 4.2 g/dL (ref 3.5–5.2)
Alkaline Phosphatase: 89 U/L (ref 39–117)
BUN: 20 mg/dL (ref 6–23)
CO2: 25 meq/L (ref 19–32)
Calcium: 9.4 mg/dL (ref 8.4–10.5)
Chloride: 106 meq/L (ref 96–112)
Creatinine, Ser: 0.71 mg/dL (ref 0.40–1.20)
GFR: 92.04 mL/min (ref >60.00)
Glucose, Bld: 132 mg/dL — ABNORMAL HIGH (ref 70–99)
Potassium: 4 meq/L (ref 3.5–5.1)
Sodium: 141 meq/L (ref 135–145)
Total Bilirubin: 0.2 mg/dL (ref 0.2–1.2)
Total Protein: 6.7 g/dL (ref 6.0–8.3)

## 2023-06-13 LAB — CBC
HCT: 45.1 % (ref 36.0–46.0)
Hemoglobin: 15.1 g/dL — ABNORMAL HIGH (ref 12.0–15.0)
MCHC: 33.6 g/dL (ref 30.0–36.0)
MCV: 89.4 fl (ref 78.0–100.0)
Platelets: 248 10*3/uL (ref 150.0–400.0)
RBC: 5.04 Mil/uL (ref 3.87–5.11)
RDW: 14.7 % (ref 11.5–15.5)
WBC: 13.1 10*3/uL — ABNORMAL HIGH (ref 4.0–10.5)

## 2023-06-13 LAB — LIPID PANEL
Cholesterol: 285 mg/dL — ABNORMAL HIGH (ref 0–200)
HDL: 47.2 mg/dL (ref >39.00)
LDL Cholesterol: 162 mg/dL — ABNORMAL HIGH (ref 0–99)
NonHDL: 237.62
Total CHOL/HDL Ratio: 6
Triglycerides: 376 mg/dL — ABNORMAL HIGH (ref 0.0–149.0)
VLDL: 75.2 mg/dL — ABNORMAL HIGH (ref 0.0–40.0)

## 2023-06-13 LAB — HEMOGLOBIN A1C: Hgb A1c MFr Bld: 6.3 % (ref 4.6–6.5)

## 2023-06-13 LAB — TSH: TSH: 0.86 u[IU]/mL (ref 0.35–5.50)

## 2023-06-13 MED ORDER — BREZTRI AEROSPHERE 160-9-4.8 MCG/ACT IN AERO: 2.0000 | INHALATION_SPRAY | Freq: Two times a day (BID) | RESPIRATORY_TRACT | 5 refills | Status: DC

## 2023-06-13 MED ORDER — ALBUTEROL SULFATE HFA 108 (90 BASE) MCG/ACT IN AERS: 2.0000 | INHALATION_SPRAY | Freq: Four times a day (QID) | RESPIRATORY_TRACT | 0 refills | Status: AC | PRN

## 2023-06-13 NOTE — Assessment & Plan Note (Signed)
 Check lipids

## 2023-06-13 NOTE — Assessment & Plan Note (Signed)
 Check A1c today.

## 2023-06-13 NOTE — Patient Instructions (Signed)
 It was very nice to see you today!  I think your COPD has flared up.  Please start the Breztri and albuterol .  Will check blood work today.  Please continue to work down on smoking.  Let me know next week how you are doing.  Return in about 1 week (around 06/20/2023) for Follow Up.   Take care, Dr Daneil Dunker  PLEASE NOTE:  If you had any lab tests, please let us  know if you have not heard back within a few days. You may see your results on mychart before we have a chance to review them but we will give you a call once they are reviewed by us .   If we ordered any referrals today, please let us  know if you have not heard from their office within the next week.   If you had any urgent prescriptions sent in today, please check with the pharmacy within an hour of our visit to make sure the prescription was transmitted appropriately.   Please try these tips to maintain a healthy lifestyle:  Eat at least 3 REAL meals and 1-2 snacks per day.  Aim for no more than 5 hours between eating.  If you eat breakfast, please do so within one hour of getting up.   Each meal should contain half fruits/vegetables, one quarter protein, and one quarter carbs (no bigger than a computer mouse)  Cut down on sweet beverages. This includes juice, soda, and sweet tea.   Drink at least 1 glass of water  with each meal and aim for at least 8 glasses per day  Exercise at least 150 minutes every week.

## 2023-06-13 NOTE — Assessment & Plan Note (Signed)
 Patient was asked about her tobacco use today and was strongly advised to quit. Patient is currently contemplative. We reviewed treatment options to assist her quit smoking including NRT, Chantix, and Bupropion .  Due to comorbid psych conditions would avoid Chantix and bupropion  at this point.  Recommended she try nicotine  replacement such as nicotine  patch.  She does have patches at home and will try this.  Follow up at next office visit.   Total time spent counseling approximately 3 minutes.

## 2023-06-13 NOTE — Assessment & Plan Note (Signed)
 Not controlled.  Was previously on Symbicort  but did not have much benefit with this.  We will start Breztri.  Also sent a prescription in for albuterol  inhaler.  She will follow-up with us  next week as above.  If COPD symptoms are not improving may need referral to pulmonology.

## 2023-06-13 NOTE — Assessment & Plan Note (Signed)
At goal today without meds. 

## 2023-06-13 NOTE — Progress Notes (Signed)
   Danielle Cox is a 63 y.o. female who presents today for an office visit.  Assessment/Plan:  New/Acute Problems: Dyspnea on exertion Likely due to uncontrolled COPD given her lung exam findings however they discussed with patient this may represent cardiac etiology as well as she does have several risk factors including smoking, dyslipidemia, hypertension, and insulin resistance.  We be treating her COPD as below.  Will also check labs today including CBC.  Well score 0-doubt PE.  I did recommend referral to cardiology for further testing however she declined for today.  She will follow-up with us  next week.  If symptoms persist despite treatment for COPD as below will need to pursue cardiac workup at that point.  We discussed reasons to return to care and seek emergent care.  Chronic Problems Addressed Today: COPD (chronic obstructive pulmonary disease) (HCC) Not controlled.  Was previously on Symbicort  but did not have much benefit with this.  We will start Breztri.  Also sent a prescription in for albuterol  inhaler.  She will follow-up with us  next week as above.  If COPD symptoms are not improving may need referral to pulmonology.  Nicotine  dependence with current use Patient was asked about her tobacco use today and was strongly advised to quit. Patient is currently contemplative. We reviewed treatment options to assist her quit smoking including NRT, Chantix, and Bupropion .  Due to comorbid psych conditions would avoid Chantix and bupropion  at this point.  Recommended she try nicotine  replacement such as nicotine  patch.  She does have patches at home and will try this.  Follow up at next office visit.   Total time spent counseling approximately 3 minutes.    Essential hypertension At goal today without meds.  Insulin resistance Check A1c today.  Dyslipidemia Check lipids.     Subjective:  HPI:  See A/P for status of chronic conditions.  Patient here today with progressive  shortness of breath with exertion.  This started about 4 weeks ago.  No recent illnesses or obvious precipitating events.  She has noticed more wheeze and cough over the last few weeks as well.  No fevers or chills.  She does have a history of COPD but has been out of her inhalers for the last few years.  Does not have any shortness of breath at rest however does notice that when she does any more exertion such as climbing flight of stairs she gets more short of breath.  She is having some chest tightness.  No chest pain.  Cough is nonproductive.  Symptoms been stable the last few weeks.  Not currently having any symptoms.  She was previously on Symbicort .  Did not find it particularly effective.  She has been off this for about a year or so.        Objective:  Physical Exam: BP (!) 125/55   Pulse 72   Temp 97.9 F (36.6 C) (Temporal)   Ht 5\' 5"  (1.651 m)   Wt 213 lb 3.2 oz (96.7 kg)   LMP 02/12/2016   SpO2 92%   BMI 35.48 kg/m   Gen: No acute distress, resting comfortably CV: Regular rate and rhythm with occasional ectopic beat.  No murmurs appreciated Pulm: Normal work of breathing speaking in full sentences.  Scattered wheezes and rhonchi noted throughout all lung fields. Neuro: Grossly normal, moves all extremities Psych: Normal affect and thought content      Johnathan Tortorelli M. Daneil Dunker, MD 06/13/2023 10:52 AM

## 2023-06-14 ENCOUNTER — Ambulatory Visit: Payer: Self-pay | Admitting: Family Medicine

## 2023-06-14 ENCOUNTER — Telehealth: Payer: Self-pay | Admitting: *Deleted

## 2023-06-14 ENCOUNTER — Other Ambulatory Visit: Payer: Self-pay | Admitting: *Deleted

## 2023-06-14 DIAGNOSIS — D729 Disorder of white blood cells, unspecified: Secondary | ICD-10-CM

## 2023-06-14 NOTE — Telephone Encounter (Signed)
 Copied from CRM [id number]. Topic: Clinical - Lab/Test Results >> Jun 14, 2023 10:51 AM Lajean Pike wrote: Reason for CRM: Patient returned call to [facility]. Relayed lab results. Patient stated the she feels better since taking the new medications between yesterday and today and that she is taking Lipitor.   See lab results [facility]

## 2023-06-14 NOTE — Progress Notes (Signed)
 Her white blood cell count is elevated.  This may be related to her lung issues.  Recommend she come back in a few weeks to recheck.  Please place future order for CBC with differential.  Her cholesterol is elevated.  Can we verify that she has been taking her Lipitor?  Her A1c is in the borderline diabetic range.  Do not need to start meds however she should continue to work on diet and exercise and we can recheck in a year.  The rest of her labs are all at goal.  She should let us  know if her breathing symptoms are not improving with the medications we started at her office visit.

## 2023-06-20 ENCOUNTER — Other Ambulatory Visit (INDEPENDENT_AMBULATORY_CARE_PROVIDER_SITE_OTHER)

## 2023-06-20 DIAGNOSIS — D729 Disorder of white blood cells, unspecified: Secondary | ICD-10-CM | POA: Diagnosis not present

## 2023-06-20 LAB — CBC WITH DIFFERENTIAL/PLATELET
Basophils Absolute: 0 10*3/uL (ref 0.0–0.1)
Basophils Relative: 0.3 % (ref 0.0–3.0)
Eosinophils Absolute: 0.2 10*3/uL (ref 0.0–0.7)
Eosinophils Relative: 1.9 % (ref 0.0–5.0)
HCT: 45.4 % (ref 36.0–46.0)
Hemoglobin: 15 g/dL (ref 12.0–15.0)
Lymphocytes Relative: 19.3 % (ref 12.0–46.0)
Lymphs Abs: 2.3 10*3/uL (ref 0.7–4.0)
MCHC: 33.1 g/dL (ref 30.0–36.0)
MCV: 89.8 fl (ref 78.0–100.0)
Monocytes Absolute: 0.9 10*3/uL (ref 0.1–1.0)
Monocytes Relative: 8 % (ref 3.0–12.0)
Neutro Abs: 8.2 10*3/uL — ABNORMAL HIGH (ref 1.4–7.7)
Neutrophils Relative %: 70.5 % (ref 43.0–77.0)
Platelets: 292 10*3/uL (ref 150.0–400.0)
RBC: 5.06 Mil/uL (ref 3.87–5.11)
RDW: 14.8 % (ref 11.5–15.5)
WBC: 11.7 10*3/uL — ABNORMAL HIGH (ref 4.0–10.5)

## 2023-06-21 ENCOUNTER — Ambulatory Visit: Payer: Self-pay | Admitting: Family Medicine

## 2023-06-21 NOTE — Progress Notes (Signed)
 Her white blood count is improving.  We can recheck again at next office visit.

## 2023-07-12 ENCOUNTER — Ambulatory Visit: Admitting: Family Medicine

## 2023-07-12 ENCOUNTER — Telehealth: Payer: Self-pay | Admitting: Family Medicine

## 2023-07-12 ENCOUNTER — Encounter: Payer: Self-pay | Admitting: Family Medicine

## 2023-07-12 VITALS — BP 137/80 | HR 94 | Temp 98.1°F | Ht 65.0 in | Wt 209.0 lb

## 2023-07-12 DIAGNOSIS — D72829 Elevated white blood cell count, unspecified: Secondary | ICD-10-CM | POA: Diagnosis not present

## 2023-07-12 DIAGNOSIS — R350 Frequency of micturition: Secondary | ICD-10-CM | POA: Diagnosis not present

## 2023-07-12 DIAGNOSIS — I10 Essential (primary) hypertension: Secondary | ICD-10-CM | POA: Diagnosis not present

## 2023-07-12 DIAGNOSIS — J449 Chronic obstructive pulmonary disease, unspecified: Secondary | ICD-10-CM | POA: Diagnosis not present

## 2023-07-12 LAB — URINALYSIS, ROUTINE W REFLEX MICROSCOPIC
Bilirubin Urine: NEGATIVE
Ketones, ur: NEGATIVE
Leukocytes,Ua: NEGATIVE
Nitrite: NEGATIVE
Specific Gravity, Urine: <=1.005 — AB (ref 1.000–1.030)
Total Protein, Urine: NEGATIVE
Urine Glucose: NEGATIVE
Urobilinogen, UA: 0.2 (ref 0.0–1.0)
pH: 6 (ref 5.0–8.0)

## 2023-07-12 MED ORDER — NITROFURANTOIN MONOHYD MACRO 100 MG PO CAPS
100.0000 mg | ORAL_CAPSULE | Freq: Two times a day (BID) | ORAL | 0 refills | Status: DC
Start: 2023-07-12 — End: 2023-11-05

## 2023-07-12 NOTE — Assessment & Plan Note (Signed)
At goal today without medications. 

## 2023-07-12 NOTE — Telephone Encounter (Signed)
 Called and LVM informing patient that urine sample she left was not enough for the lab to test. Offered her to come back in and leave new sample.

## 2023-07-12 NOTE — Progress Notes (Signed)
   Danielle Cox is a 63 y.o. female who presents today for an office visit.  Assessment/Plan:  New/Acute Problems: Urinary Frequency  No red flags.  History consistent with previous UTIs.  Will empirically start Macrobid  while we await urine culture results.  We encouraged hydration.  We discussed reasons to return to care.  Leukocytosis Incidentally found on labs a few weeks ago.  Will hold off on rechecking today due to her above concern for UTI though she will come back to have this rechecked once her UTI symptoms resolved.  Chronic Problems Addressed Today: Essential hypertension At goal today without medications.   COPD (chronic obstructive pulmonary disease) (HCC) Doing much better with this compared to her last visit a few weeks ago.  She is doing well with her current regimen breztri  and albuterol  as needed.     Subjective:  HPI:  See Assessment / plan for status of chronic conditions.  She is here today with concern for urinary tract infection.  She has had more urinary frequency, lower abdominal pain, and pressure for the last week.  No dysuria.  She has noticed increased odor to her urine as well.  No fevers or chills.  Some back pain.  Tried using cranberry and Azo without any improvement.  Feels similar to previous UTIs.  She has also had green stool the last couple of days.  No melena.  No hematochezia.       Objective:  Physical Exam: BP 137/80   Pulse 94   Temp 98.1 F (36.7 C) (Temporal)   Ht 5' 5 (1.651 m)   Wt 209 lb (94.8 kg)   LMP 02/12/2016   SpO2 96%   BMI 34.78 kg/m   Gen: No acute distress, resting comfortably CV: Regular rate and rhythm with no murmurs appreciated Pulm: Normal work of breathing, clear to auscultation bilaterally with no crackles, wheezes, or rhonchi MUSCULOSKELETAL: No CVA tenderness Neuro: Grossly normal, moves all extremities Psych: Normal affect and thought content      Fannye Myer M. Kennyth, MD 07/12/2023 9:37 AM

## 2023-07-12 NOTE — Patient Instructions (Signed)
 It was very nice to see you today!  Please start the antibiotic.  Will contact you once we get results back on your urine culture.  Please make sure that you are getting plenty of fluids.  Let us  know if your symptoms do not improve.  Return if symptoms worsen or fail to improve.   Take care, Dr Kennyth  PLEASE NOTE:  If you had any lab tests, please let us  know if you have not heard back within a few days. You may see your results on mychart before we have a chance to review them but we will give you a call once they are reviewed by us .   If we ordered any referrals today, please let us  know if you have not heard from their office within the next week.   If you had any urgent prescriptions sent in today, please check with the pharmacy within an hour of our visit to make sure the prescription was transmitted appropriately.   Please try these tips to maintain a healthy lifestyle:  Eat at least 3 REAL meals and 1-2 snacks per day.  Aim for no more than 5 hours between eating.  If you eat breakfast, please do so within one hour of getting up.   Each meal should contain half fruits/vegetables, one quarter protein, and one quarter carbs (no bigger than a computer mouse)  Cut down on sweet beverages. This includes juice, soda, and sweet tea.   Drink at least 1 glass of water  with each meal and aim for at least 8 glasses per day  Exercise at least 150 minutes every week.

## 2023-07-12 NOTE — Assessment & Plan Note (Signed)
 Doing much better with this compared to her last visit a few weeks ago.  She is doing well with her current regimen breztri  and albuterol  as needed.

## 2023-07-13 LAB — URINE CULTURE
MICRO NUMBER:: 16634341
Result:: NO GROWTH
SPECIMEN QUALITY:: ADEQUATE

## 2023-07-15 ENCOUNTER — Ambulatory Visit: Payer: Self-pay

## 2023-07-15 ENCOUNTER — Ambulatory Visit: Payer: Self-pay | Admitting: Family Medicine

## 2023-07-15 DIAGNOSIS — R319 Hematuria, unspecified: Secondary | ICD-10-CM

## 2023-07-15 NOTE — Telephone Encounter (Signed)
 FYI Only or Action Required?: Action required by provider: clinical question for provider.  Patient was last seen in primary care on 07/12/2023 by Kennyth Worth HERO, MD. Called Nurse Triage reporting Urinary Tract Infection. Symptoms began several days ago. Interventions attempted: Prescription medications: Macrobid . Symptoms are: unchanged.  Triage Disposition: See Physician Within 24 Hours- please advise- no appt made at this time  Patient/caregiver understands and will follow disposition?: YesCopied from CRM [id number]. Topic: Clinical - Red Word Triage >> Jul 15, 2023 12:34 PM Viola F wrote: Red Word that prompted transfer to Nurse Triage: Patient was seen 07/12/23 for possible bladder infection, test came back negative but patient is still having pressure, pain and urgency during urination and she is still taking antibiotics. Reason for Disposition  [1] Taking antibiotic > 72 hours (3 days) for UTI AND [2] flank or lower back pain is SAME (unchanged, not better)  Answer Assessment - Initial Assessment Questions 1. MAIN SYMPTOM: What is the main symptom you are concerned about? (e.g., painful urination, urine frequency)     pressure 2. BETTER-SAME-WORSE: Are you getting better, staying the same, or getting worse compared to how you felt at your last visit to the doctor (most recent medical visit)?     Same  3. PAIN: How bad is the pain?  (e.g., Scale 1-10; mild, moderate, or severe)   - MILD (1-3): complains slightly about urination hurting   - MODERATE (4-7): interferes with normal activities     - SEVERE (8-10): excruciating, unwilling or unable to urinate because of the pain      6 4. FEVER: Do you have a fever? If Yes, ask: What is it, how was it measured, and when did it start?     Denies  5. OTHER SYMPTOMS: Do you have any other symptoms? (e.g., blood in the urine, flank pain, vaginal discharge)     Urgency  6. DIAGNOSIS: When was the UTI diagnosed? By whom? Was it a  kidney infection, bladder infection or both?     Pt not sure  7. ANTIBIOTIC: What antibiotic(s) are you taking? How many times per day?     Macrobid  8. ANTIBIOTIC - START DATE: When did you start taking the antibiotic?     6/27   Pt is having same issues- not worse but same intensity. Pt feels like bladder Is full yet very little comes out. Pt is taking OTC pain medication.  Pt is requesting this be addressed without another visit. No appt made at this time. Please advise.  Protocols used: Urinary Tract Infection on Antibiotic Follow-up Call - [facility]

## 2023-07-15 NOTE — Progress Notes (Signed)
 Her urine culture is negative.  She should let us  know if her symptoms are not improving however recommend she come back in 1 to 2 weeks to repeat send out urinalysis due to the blood in her urine.  Please place future order for urinalysis.

## 2023-07-16 NOTE — Telephone Encounter (Signed)
 Her urine culture was negative for infection but it did have blood. It is possible that she may have a kidney stone or other urologic issue. If is she is still having significant issues then recommend we check for a kidney stone. Please order CT abdomen without contrast. Also recommend urgent urology referral.  Worth HERO. Kennyth, MD 07/16/2023 8:48 AM

## 2023-07-16 NOTE — Telephone Encounter (Signed)
 Dr. Kennyth, please see Triage message and advise.

## 2023-07-17 NOTE — Telephone Encounter (Signed)
 I left pt a voicemail letting her know the info below; I asked for her to call us  back if she is okay with the CT and urology referral and we can place those for her.

## 2023-07-18 DIAGNOSIS — F3160 Bipolar disorder, current episode mixed, unspecified: Secondary | ICD-10-CM | POA: Diagnosis not present

## 2023-07-18 DIAGNOSIS — F419 Anxiety disorder, unspecified: Secondary | ICD-10-CM | POA: Diagnosis not present

## 2023-07-18 DIAGNOSIS — F17200 Nicotine dependence, unspecified, uncomplicated: Secondary | ICD-10-CM | POA: Diagnosis not present

## 2023-07-22 ENCOUNTER — Telehealth: Payer: Self-pay | Admitting: *Deleted

## 2023-07-22 NOTE — Telephone Encounter (Deleted)
 Copied from CRM [id number]. Topic: General - Call Back - No Documentation >> Jul 18, 2023  3:33 PM Chiquita SQUIBB wrote: Reason for CRM: Patient is calling Lauraine back to let her know she would like to continue with the CT and urology referral if those orders could be placed for her and then notified when they are available.

## 2023-07-22 NOTE — Telephone Encounter (Signed)
 Copied from CRM [id number]. Topic: General - Call Back - No Documentation >> Jul 18, 2023  3:33 PM Chiquita SQUIBB wrote: Reason for CRM: Patient is calling Lauraine back to let her know she would like to continue with the CT and urology referral if those orders could be placed for her and then notified when they are available.

## 2023-07-23 ENCOUNTER — Other Ambulatory Visit: Payer: Self-pay | Admitting: *Deleted

## 2023-07-23 DIAGNOSIS — R319 Hematuria, unspecified: Secondary | ICD-10-CM

## 2023-07-23 NOTE — Telephone Encounter (Signed)
 Ok to order CT abdomen with contrast and refer her to urology.  Worth HERO. Kennyth, MD 07/23/2023 7:45 AM

## 2023-07-23 NOTE — Telephone Encounter (Signed)
 Referral placed  CT order

## 2023-07-25 ENCOUNTER — Telehealth: Payer: Self-pay | Admitting: *Deleted

## 2023-07-25 NOTE — Telephone Encounter (Signed)
 Copied from CRM [id number]. Topic: Referral - Prior Authorization Question >> Jul 25, 2023 10:04 AM Macario HERO wrote: Reason for CRM: Consuelo from [facility] called and said patient has a CT scan tomorrow and she is calling to see if prior authorization was required and if so is it done. This is required by 12pm today or else appointment is cancelled. Phone: [phone]

## 2023-07-25 NOTE — Telephone Encounter (Signed)
 Olam said PA needs to be done, not sure how got scheduled already. I message Olam and told her I will cancel CT for tomorrow and let pt know she will need to reschedule once she hears from you that it is approved.  I called [place] and spoke to Happy told her to cancel CT for tomorrow, does need a PA. Asked her how pt got scheduled without being done? Consuelo said she was not sure. Told her I will let pt know. Consuelo verbalized understanding.  Spoke to pt, told her had to cancel CT scan for tomorrow due to PA for test was not done yet. Told her Olam is working on it and will be in touch once she had done and approved and then you can call [place] to reschedule. Told her I spoke to [employer] and told her to cancel CT scan for tomorrow and you will call back to schedule once PA done. Pt verbalized understanding.

## 2023-07-25 NOTE — Telephone Encounter (Signed)
 Teams message sent to [place] about message from [facility].

## 2023-07-26 ENCOUNTER — Inpatient Hospital Stay: Admission: RE | Admit: 2023-07-26 | Source: Ambulatory Visit

## 2023-07-29 NOTE — Telephone Encounter (Unsigned)
 Copied from CRM [id number]. Topic: General - Other >> Jul 29, 2023 10:19 AM Martinique E wrote: Reason for CRM: Patient called back today to see if the prior authorization for her CT scan has been completed, as she had to cancel her CT for last week. Callback number for patient is [phone].

## 2023-07-31 ENCOUNTER — Telehealth: Payer: Self-pay | Admitting: Family Medicine

## 2023-07-31 NOTE — Telephone Encounter (Signed)
 Spoke to pt told her PA was approved for Imaging, you can call and reschedule. Pt verbalized understanding and said she already rescheduled.

## 2023-07-31 NOTE — Telephone Encounter (Signed)
 Called [facility] and spoke with Danielle Cox. Patients referral is authorized and good to go, patient is scheduled for her CT tomorrow, Danielle Cox stated the pt has no yet picked up her contrast. I called the pt and left her a vm and stated her referral is good to go and she is scheduled for tomorrow and she needs to go this evening to pick up her two bottles of contrast.    Copied from CRM [id number]. Topic: Referral - Question >> Jul 31, 2023 10:50 AM Danielle Cox wrote: Reason for CRM: [name] stated that the pt called [facility] to schedule the appt for the CT scan and they informed them that the referral has not been authorized. I informed the husband that the referral is authorized and should be able to schedule. Husband stated that they recently called and the appt was scheduled for Monday but they had to cancel due to the office not stating they don't have the referral. Husband would like for a nurse or someone in the office to reach out to [facility] to get the issue resolved and would like to receive a callback with an update.

## 2023-08-01 ENCOUNTER — Inpatient Hospital Stay
Admission: RE | Admit: 2023-08-01 | Discharge: 2023-08-01 | Disposition: A | Discharge status: Home / Self Care | Source: Ambulatory Visit | Attending: Family Medicine | Admitting: Family Medicine

## 2023-08-01 DIAGNOSIS — K5730 Diverticulosis of large intestine without perforation or abscess without bleeding: Secondary | ICD-10-CM | POA: Diagnosis not present

## 2023-08-01 DIAGNOSIS — N201 Calculus of ureter: Secondary | ICD-10-CM | POA: Diagnosis not present

## 2023-08-01 DIAGNOSIS — R319 Hematuria, unspecified: Secondary | ICD-10-CM

## 2023-08-01 MED ORDER — IOPAMIDOL (ISOVUE-300) INJECTION 61%
100.0000 mL | Freq: Once | INTRAVENOUS | Status: AC | PRN
Start: 2023-08-01 — End: 2023-08-01
  Administered 2023-08-01: 100 mL via INTRAVENOUS

## 2023-08-06 ENCOUNTER — Ambulatory Visit: Payer: Self-pay | Admitting: Family Medicine

## 2023-08-06 DIAGNOSIS — N200 Calculus of kidney: Secondary | ICD-10-CM

## 2023-08-06 NOTE — Progress Notes (Signed)
 Her CT scan shows that she has a kidney stone.  Recommend urgent referral to urology as it is not clear that these will pass on their own.  She should let us  know if she is still having any worsening pain or other urinary symptoms.

## 2023-08-07 DIAGNOSIS — N201 Calculus of ureter: Secondary | ICD-10-CM | POA: Diagnosis not present

## 2023-08-08 ENCOUNTER — Other Ambulatory Visit: Payer: Self-pay | Admitting: Urology

## 2023-08-09 ENCOUNTER — Other Ambulatory Visit: Payer: Self-pay | Admitting: Urology

## 2023-08-14 ENCOUNTER — Other Ambulatory Visit: Payer: Self-pay | Admitting: Urology

## 2023-08-28 DIAGNOSIS — N201 Calculus of ureter: Secondary | ICD-10-CM | POA: Diagnosis not present

## 2023-09-12 DIAGNOSIS — F3160 Bipolar disorder, current episode mixed, unspecified: Secondary | ICD-10-CM | POA: Diagnosis not present

## 2023-09-12 DIAGNOSIS — F4312 Post-traumatic stress disorder, chronic: Secondary | ICD-10-CM | POA: Diagnosis not present

## 2023-09-13 ENCOUNTER — Encounter (HOSPITAL_COMMUNITY)

## 2023-09-17 DIAGNOSIS — F3160 Bipolar disorder, current episode mixed, unspecified: Secondary | ICD-10-CM | POA: Diagnosis not present

## 2023-09-17 DIAGNOSIS — F4312 Post-traumatic stress disorder, chronic: Secondary | ICD-10-CM | POA: Diagnosis not present

## 2023-09-20 ENCOUNTER — Ambulatory Visit (HOSPITAL_COMMUNITY): Admit: 2023-09-20 | Admitting: Urology

## 2023-09-20 SURGERY — CYSTOSCOPY/URETEROSCOPY/HOLMIUM LASER/STENT PLACEMENT
Anesthesia: General | Laterality: Left

## 2023-09-24 DIAGNOSIS — F3160 Bipolar disorder, current episode mixed, unspecified: Secondary | ICD-10-CM | POA: Diagnosis not present

## 2023-09-24 DIAGNOSIS — F419 Anxiety disorder, unspecified: Secondary | ICD-10-CM | POA: Diagnosis not present

## 2023-09-24 DIAGNOSIS — F4312 Post-traumatic stress disorder, chronic: Secondary | ICD-10-CM | POA: Diagnosis not present

## 2023-09-24 DIAGNOSIS — F17200 Nicotine dependence, unspecified, uncomplicated: Secondary | ICD-10-CM | POA: Diagnosis not present

## 2023-10-01 DIAGNOSIS — F3160 Bipolar disorder, current episode mixed, unspecified: Secondary | ICD-10-CM | POA: Diagnosis not present

## 2023-10-01 DIAGNOSIS — F4312 Post-traumatic stress disorder, chronic: Secondary | ICD-10-CM | POA: Diagnosis not present

## 2023-10-15 DIAGNOSIS — F4312 Post-traumatic stress disorder, chronic: Secondary | ICD-10-CM | POA: Diagnosis not present

## 2023-10-15 DIAGNOSIS — F3160 Bipolar disorder, current episode mixed, unspecified: Secondary | ICD-10-CM | POA: Diagnosis not present

## 2023-10-29 DIAGNOSIS — F4312 Post-traumatic stress disorder, chronic: Secondary | ICD-10-CM | POA: Diagnosis not present

## 2023-10-29 DIAGNOSIS — F3160 Bipolar disorder, current episode mixed, unspecified: Secondary | ICD-10-CM | POA: Diagnosis not present

## 2023-11-04 ENCOUNTER — Ambulatory Visit: Payer: Self-pay | Admitting: *Deleted

## 2023-11-04 NOTE — Telephone Encounter (Signed)
 FYI Only or Action Required?: FYI only for provider.  Patient was last seen in primary care on 07/12/2023 by Kennyth Worth HERO, MD.  Called Nurse Triage reporting Back Pain.  Symptoms began several weeks ago.  Interventions attempted: OTC medications: Aleve .  Symptoms are: unchanged.  Triage Disposition: See PCP Within 2 Weeks  Patient/caregiver understands and will follow disposition?: yes   Reason for Disposition  Back pain present > 2 weeks  Answer Assessment - Initial Assessment Questions 1. ONSET: When did the pain begin? (e.g., minutes, hours, days)    2 weeks ago 2. LOCATION: Where does it hurt? (upper, mid or lower back)     Lower back - R 3. SEVERITY: How bad is the pain?  (e.g., Scale 1-10; mild, moderate, or severe)     8/10 4. PATTERN: Is the pain constant? (e.g., yes, no; constant, intermittent)      All the time 5. RADIATION: Does the pain shoot into your legs or somewhere else?     No radiation  6. CAUSE:  What do you think is causing the back pain?      Strain  7. BACK OVERUSE:  Any recent lifting of heavy objects, strenuous work or exercise?     Yard work 8. MEDICINES: What have you taken so far for the pain? (e.g., nothing, acetaminophen , NSAIDS)     Aleve  9. NEUROLOGIC SYMPTOMS: Do you have any weakness, numbness, or problems with bowel/bladder control?     no 10. OTHER SYMPTOMS: Do you have any other symptoms? (e.g., fever, abdomen pain, burning with urination, blood in urine)       no  Protocols used: Back Pain-A-AH  Copied from CRM #[id number]. Topic: Clinical - Red Word Triage >> Nov 04, 2023 11:25 AM Thersia BROCKS wrote: Red Word that prompted transfer to Nurse Triage: Patient called in stated she is alot of pain lower back pain

## 2023-11-05 ENCOUNTER — Ambulatory Visit: Admitting: Family Medicine

## 2023-11-05 ENCOUNTER — Encounter: Payer: Self-pay | Admitting: Family Medicine

## 2023-11-05 VITALS — BP 112/70 | HR 98 | Temp 98.0°F | Ht 65.0 in | Wt 202.0 lb

## 2023-11-05 DIAGNOSIS — F609 Personality disorder, unspecified: Secondary | ICD-10-CM

## 2023-11-05 DIAGNOSIS — M545 Low back pain, unspecified: Secondary | ICD-10-CM | POA: Diagnosis not present

## 2023-11-05 DIAGNOSIS — I10 Essential (primary) hypertension: Secondary | ICD-10-CM

## 2023-11-05 MED ORDER — MELOXICAM 15 MG PO TABS: 15.0000 mg | ORAL_TABLET | Freq: Every day | ORAL | 0 refills | Status: AC

## 2023-11-05 MED ORDER — CYCLOBENZAPRINE HCL 10 MG PO TABS: 10.0000 mg | ORAL_TABLET | Freq: Three times a day (TID) | ORAL | 0 refills | Status: AC | PRN

## 2023-11-05 NOTE — Progress Notes (Signed)
   Danielle Cox is a 63 y.o. female who presents today for an office visit.  Assessment/Plan:   Personality disorder (HCC) Stable.  Continue management per psychiatry.  Essential hypertension At goal today without meds.  Lumbar back pain Patient with flareup of low back pain over the last few weeks.  No red flags on exam.  Likely muscular strain based on today's exam.  We discussed treatment options.  Will start with conservative management.  Start meloxicam 15 mg daily for the next 1 to 2 weeks.  Will also start Flexeril  10 mg 3 times daily as needed.  Discussed potential side effects.  It is okay for her to continue with a heating pad.  We also discussed home exercise program and handout was given.  She will let us  know if not improving in the next couple of weeks and would consider referral to PT or sports medicine at that time.     Subjective:  HPI:  See assessment / plan for status of chronic conditions.    Discussed the use of AI scribe software for clinical note transcription with the patient, who gave verbal consent to proceed.  History of Present Illness Danielle Cox is a 63 year old female who presents with right lower back pain.  She has been experiencing right lower back pain for a few weeks, which began after raking leaves for an extended period. The pain is described as a pulling sensation localized to the right lower back. There was no specific incident that triggered the pain.  She has attempted to manage the pain with Aleve , which has not provided significant relief, and a heating pad, which has offered minimal improvement. The pain has remained constant over the past few weeks without any improvement or worsening.  No radiation of the pain down the leg, numbness, or tingling. She has not experienced similar back pain in the past.         Objective:  Physical Exam: BP 112/70   Pulse 98   Temp 98 F (36.7 C) (Temporal)   Ht 5' 5 (1.651 m)   Wt 202 lb  (91.6 kg)   LMP 02/12/2016   SpO2 97%   BMI 33.61 kg/m   Gen: No acute distress, resting comfortably CV: Regular rate and rhythm with no murmurs appreciated Pulm: Normal work of breathing, clear to auscultation bilaterally with no crackles, wheezes, or rhonchi MUSCULOSKELETAL: - Back: No deformities.  Right lower back tender to palpation.  Straight leg raise negative.  Neurovascular intact distally.  Reflexes 2+ and symmetric bilaterally. Neuro: Grossly normal, moves all extremities Psych: Normal affect and thought content      Bud Kaeser M. Kennyth, MD 11/05/2023 10:55 AM

## 2023-11-05 NOTE — Patient Instructions (Signed)
 It was very nice to see you today!  VISIT SUMMARY: Today, you were seen for right lower back pain that has been ongoing for a few weeks. The pain started after raking leaves and has not improved with Aleve  or a heating pad.  YOUR PLAN: RIGHT LOWER BACK MUSCLE SPASM: You have a muscle spasm in your right lower back, likely due to strain from raking leaves. -You have been prescribed Flexeril  to help relax your muscles and meloxicam for its anti-inflammatory effect. -Use a heating pad for relief. -Follow the stretches and exercises provided in the handout. -If there is no improvement in 1-2 weeks, please schedule a follow-up appointment.  Return if symptoms worsen or fail to improve.   Take care, Dr Kennyth  PLEASE NOTE:  If you had any lab tests, please let us  know if you have not heard back within a few days. You may see your results on mychart before we have a chance to review them but we will give you a call once they are reviewed by us .   If we ordered any referrals today, please let us  know if you have not heard from their office within the next week.   If you had any urgent prescriptions sent in today, please check with the pharmacy within an hour of our visit to make sure the prescription was transmitted appropriately.   Please try these tips to maintain a healthy lifestyle:  Eat at least 3 REAL meals and 1-2 snacks per day.  Aim for no more than 5 hours between eating.  If you eat breakfast, please do so within one hour of getting up.   Each meal should contain half fruits/vegetables, one quarter protein, and one quarter carbs (no bigger than a computer mouse)  Cut down on sweet beverages. This includes juice, soda, and sweet tea.   Drink at least 1 glass of water  with each meal and aim for at least 8 glasses per day  Exercise at least 150 minutes every week.

## 2023-11-05 NOTE — Assessment & Plan Note (Signed)
Stable.  Continue management per psychiatry. 

## 2023-11-05 NOTE — Assessment & Plan Note (Signed)
 At goal today without meds.

## 2023-11-05 NOTE — Assessment & Plan Note (Signed)
 Patient with flareup of low back pain over the last few weeks.  No red flags on exam.  Likely muscular strain based on today's exam.  We discussed treatment options.  Will start with conservative management.  Start meloxicam 15 mg daily for the next 1 to 2 weeks.  Will also start Flexeril  10 mg 3 times daily as needed.  Discussed potential side effects.  It is okay for her to continue with a heating pad.  We also discussed home exercise program and handout was given.  She will let us  know if not improving in the next couple of weeks and would consider referral to PT or sports medicine at that time.

## 2023-11-12 DIAGNOSIS — F4312 Post-traumatic stress disorder, chronic: Secondary | ICD-10-CM | POA: Diagnosis not present

## 2023-11-12 DIAGNOSIS — F3160 Bipolar disorder, current episode mixed, unspecified: Secondary | ICD-10-CM | POA: Diagnosis not present

## 2023-11-26 DIAGNOSIS — F3160 Bipolar disorder, current episode mixed, unspecified: Secondary | ICD-10-CM | POA: Diagnosis not present

## 2023-11-26 DIAGNOSIS — F4312 Post-traumatic stress disorder, chronic: Secondary | ICD-10-CM | POA: Diagnosis not present

## 2023-11-29 ENCOUNTER — Other Ambulatory Visit: Payer: Self-pay | Admitting: Family Medicine

## 2023-12-03 DIAGNOSIS — F3160 Bipolar disorder, current episode mixed, unspecified: Secondary | ICD-10-CM | POA: Diagnosis not present

## 2023-12-03 DIAGNOSIS — F4312 Post-traumatic stress disorder, chronic: Secondary | ICD-10-CM | POA: Diagnosis not present

## 2023-12-15 ENCOUNTER — Other Ambulatory Visit: Payer: Self-pay | Admitting: Family Medicine

## 2023-12-17 DIAGNOSIS — F17200 Nicotine dependence, unspecified, uncomplicated: Secondary | ICD-10-CM | POA: Diagnosis not present

## 2023-12-17 DIAGNOSIS — F419 Anxiety disorder, unspecified: Secondary | ICD-10-CM | POA: Diagnosis not present

## 2023-12-17 DIAGNOSIS — F3160 Bipolar disorder, current episode mixed, unspecified: Secondary | ICD-10-CM | POA: Diagnosis not present

## 2023-12-17 DIAGNOSIS — F4312 Post-traumatic stress disorder, chronic: Secondary | ICD-10-CM | POA: Diagnosis not present

## 2023-12-24 DIAGNOSIS — F4312 Post-traumatic stress disorder, chronic: Secondary | ICD-10-CM | POA: Diagnosis not present

## 2023-12-24 DIAGNOSIS — F3160 Bipolar disorder, current episode mixed, unspecified: Secondary | ICD-10-CM | POA: Diagnosis not present
